# Patient Record
Sex: Male | Born: 1950 | Hispanic: Refuse to answer | Smoking: Current some day smoker
Health system: Southern US, Community
[De-identification: ages and names within clinical notes are randomized; demographics above are authoritative.]

## PROBLEM LIST (undated history)

## (undated) DIAGNOSIS — H269 Unspecified cataract: Secondary | ICD-10-CM

## (undated) DIAGNOSIS — I639 Cerebral infarction, unspecified: Secondary | ICD-10-CM

## (undated) DIAGNOSIS — I10 Essential (primary) hypertension: Secondary | ICD-10-CM

## (undated) DIAGNOSIS — M1990 Unspecified osteoarthritis, unspecified site: Secondary | ICD-10-CM

## (undated) HISTORY — PX: HIP FRACTURE SURGERY: SHX118

## (undated) HISTORY — PX: LEG AMPUTATION BELOW KNEE: SHX694

## (undated) HISTORY — DX: Unspecified cataract: H26.9

---

## 2008-05-01 DEATH — deceased

## 2009-07-12 ENCOUNTER — Encounter: Admission: RE | Admit: 2009-07-12 | Discharge: 2009-08-10 | Payer: Self-pay | Admitting: Orthopedic Surgery

## 2009-10-17 ENCOUNTER — Encounter: Admission: RE | Admit: 2009-10-17 | Discharge: 2009-12-13 | Payer: Self-pay | Admitting: Orthopedic Surgery

## 2010-11-27 ENCOUNTER — Emergency Department (HOSPITAL_COMMUNITY)
Admission: EM | Admit: 2010-11-27 | Discharge: 2010-11-27 | Disposition: A | Discharge status: Home / Self Care | Payer: Medicaid Other | Attending: Emergency Medicine | Admitting: Emergency Medicine

## 2010-11-27 DIAGNOSIS — I10 Essential (primary) hypertension: Secondary | ICD-10-CM | POA: Insufficient documentation

## 2010-11-27 DIAGNOSIS — M25519 Pain in unspecified shoulder: Secondary | ICD-10-CM | POA: Insufficient documentation

## 2010-11-27 DIAGNOSIS — H5702 Anisocoria: Secondary | ICD-10-CM | POA: Insufficient documentation

## 2011-03-25 ENCOUNTER — Emergency Department (HOSPITAL_COMMUNITY)
Admission: EM | Admit: 2011-03-25 | Discharge: 2011-03-25 | Disposition: A | Discharge status: Home / Self Care | Payer: Medicaid Other | Attending: Emergency Medicine | Admitting: Emergency Medicine

## 2011-03-25 ENCOUNTER — Encounter: Payer: Self-pay | Admitting: Emergency Medicine

## 2011-03-25 DIAGNOSIS — S161XXA Strain of muscle, fascia and tendon at neck level, initial encounter: Secondary | ICD-10-CM

## 2011-03-25 DIAGNOSIS — X58XXXA Exposure to other specified factors, initial encounter: Secondary | ICD-10-CM | POA: Insufficient documentation

## 2011-03-25 DIAGNOSIS — S88119A Complete traumatic amputation at level between knee and ankle, unspecified lower leg, initial encounter: Secondary | ICD-10-CM | POA: Insufficient documentation

## 2011-03-25 DIAGNOSIS — M2550 Pain in unspecified joint: Secondary | ICD-10-CM | POA: Insufficient documentation

## 2011-03-25 DIAGNOSIS — H9209 Otalgia, unspecified ear: Secondary | ICD-10-CM | POA: Insufficient documentation

## 2011-03-25 DIAGNOSIS — S139XXA Sprain of joints and ligaments of unspecified parts of neck, initial encounter: Secondary | ICD-10-CM | POA: Insufficient documentation

## 2011-03-25 DIAGNOSIS — M542 Cervicalgia: Secondary | ICD-10-CM | POA: Insufficient documentation

## 2011-03-25 MED ORDER — CYCLOBENZAPRINE HCL 5 MG PO TABS: 5.0000 mg | ORAL_TABLET | Freq: Three times a day (TID) | ORAL | Status: AC | PRN

## 2011-03-25 NOTE — ED Provider Notes (Signed)
History     CSN: [id number]  Arrival date & time 03/25/11  1900   First MD Initiated Contact with Patient 03/25/11 1958      Chief Complaint  Patient presents with  . Otalgia    (Consider location/radiation/quality/duration/timing/severity/associated sxs/prior treatment) HPI Comments: Kristopher Ruiz is a gentleman with 2 days of right neck pain.  That is increased with movement of his neck his pain radiates to his posterior ear, as well as to the clavicle area.  Denies trauma fever, ear infections, sore throat, toothache, fever.  Also reports, that he is out of the Percocet that he normally takes for the pain in his stump from putting tape on before his prosthesis.  He saw his primary care doctor last Thursday 6 days ago and did not report that he was out of his Percocet is requesting a refill of his Percocet at this time.  Per the controlled substance reporting website.  Mr. Sturgill received a prescription for 60 Percocet 10/325 prescribed by Dr. Mayford Knife on December 7.  He was using them correctly.  He should still have Percocet available  Patient is a 60 y.o. male presenting with ear pain. The history is provided by the patient.  Otalgia This is a new problem. The current episode started 2 days ago. There is pain in the right ear. The problem occurs constantly. The problem has not changed since onset.There has been no fever. The pain is at a severity of 2/10. The pain is mild. Associated symptoms include neck pain. Pertinent negatives include no ear discharge, no headaches, no hearing loss, no rhinorrhea and no sore throat. His past medical history does not include chronic ear infection or hearing loss.    History reviewed. No pertinent past medical history.  Past Surgical History  Procedure Date  . Leg amputation below knee     No family history on file.  History  Substance Use Topics  . Smoking status: Current Everyday Smoker    Types: Cigarettes  . Smokeless tobacco: Not on file    . Alcohol Use: No      Review of Systems  Constitutional: Negative.   HENT: Positive for ear pain and neck pain. Negative for hearing loss, sore throat, rhinorrhea, trouble swallowing, dental problem and ear discharge.   Eyes: Negative.   Respiratory: Negative.   Cardiovascular: Negative.   Gastrointestinal: Negative.   Genitourinary: Negative.   Musculoskeletal: Positive for arthralgias. Negative for myalgias, back pain, joint swelling and gait problem.  Neurological: Negative for dizziness, weakness and headaches.  Hematological: Negative.   Psychiatric/Behavioral: Negative.     Allergies  Review of patient's allergies indicates no known allergies.  Home Medications  No current outpatient prescriptions on file.  BP 110/81  Pulse 71  Temp(Src) 98.1 F (36.7 C) (Oral)  Resp 16  SpO2 97%  Physical Exam  Constitutional: He is oriented to person, place, and time. He appears well-developed and well-nourished.  HENT:  Head: Normocephalic.  Right Ear: External ear normal.  Left Ear: External ear normal.  Nose: Nose normal.  Mouth/Throat: Oropharynx is clear and moist. No oropharyngeal exudate.  Eyes: Pupils are equal, round, and reactive to light.  Neck: Normal range of motion.  Cardiovascular: Normal rate.   Pulmonary/Chest: Effort normal.  Musculoskeletal: Normal range of motion.  Neurological: He is oriented to person, place, and time.  Skin: Skin is warm and dry.    ED Course  Procedures (including critical care time)  Labs Reviewed - No data to display  No results found.   No diagnosis found.    MDM  Mr. Mrs. physical exam is quite normal without evidence of ear infection.  No rhinitis.  No dental decay.  Throat appears normal in appearance.  Uvula is midline.  After checking the controlled substance website he should have Percocet available for his use as 90 tablets were prescribed.  On 03/07/2011 we'll prescribe a muscle relaxer for Mr. Mrs. and advised  him to follow up with his primary care physician to fulfill his request for additional Percocet.        Arman Filter, NP 03/25/11 2018

## 2011-03-25 NOTE — ED Notes (Signed)
Pt c/o right ear pain radiating into right side of head.  Onset 2 days ago, has taken OTC meds without relief

## 2011-03-25 NOTE — ED Provider Notes (Signed)
Medical screening examination/treatment/procedure(s) were performed by non-physician practitioner and as supervising physician I was immediately available for consultation/collaboration.   Dayton Bailiff, MD 03/25/11 2022

## 2011-03-25 NOTE — ED Notes (Signed)
Pt here today due to pain in right side of ear, and head and neck, no congestion, no injury ear canal is not reddened or irritated ear drum pearl in color,  Pt states pain started Sunday and worsened over several days

## 2011-10-01 ENCOUNTER — Ambulatory Visit
Admission: RE | Admit: 2011-10-01 | Discharge: 2011-10-01 | Disposition: A | Discharge status: Home / Self Care | Payer: Medicaid Other | Source: Ambulatory Visit | Attending: Physician Assistant | Admitting: Physician Assistant

## 2011-10-01 ENCOUNTER — Other Ambulatory Visit: Payer: Self-pay | Admitting: Physician Assistant

## 2011-10-01 DIAGNOSIS — M545 Low back pain, unspecified: Secondary | ICD-10-CM

## 2011-10-14 ENCOUNTER — Emergency Department (HOSPITAL_COMMUNITY): Payer: Medicaid Other

## 2011-10-14 ENCOUNTER — Emergency Department (HOSPITAL_COMMUNITY)
Admission: EM | Admit: 2011-10-14 | Discharge: 2011-10-14 | Disposition: A | Discharge status: Home / Self Care | Payer: Medicaid Other | Attending: Emergency Medicine | Admitting: Emergency Medicine

## 2011-10-14 ENCOUNTER — Encounter (HOSPITAL_COMMUNITY): Payer: Self-pay | Admitting: *Deleted

## 2011-10-14 DIAGNOSIS — Z79899 Other long term (current) drug therapy: Secondary | ICD-10-CM | POA: Insufficient documentation

## 2011-10-14 DIAGNOSIS — F17200 Nicotine dependence, unspecified, uncomplicated: Secondary | ICD-10-CM | POA: Insufficient documentation

## 2011-10-14 DIAGNOSIS — I10 Essential (primary) hypertension: Secondary | ICD-10-CM | POA: Insufficient documentation

## 2011-10-14 DIAGNOSIS — M25551 Pain in right hip: Secondary | ICD-10-CM

## 2011-10-14 DIAGNOSIS — M25559 Pain in unspecified hip: Secondary | ICD-10-CM | POA: Insufficient documentation

## 2011-10-14 DIAGNOSIS — S88119A Complete traumatic amputation at level between knee and ankle, unspecified lower leg, initial encounter: Secondary | ICD-10-CM | POA: Insufficient documentation

## 2011-10-14 HISTORY — DX: Essential (primary) hypertension: I10

## 2011-10-14 MED ORDER — OXYCODONE-ACETAMINOPHEN 5-325 MG PO TABS
1.0000 | ORAL_TABLET | Freq: Once | ORAL | Status: AC
  Administered 2011-10-14: 1 via ORAL
  Filled 2011-10-14: qty 1

## 2011-10-14 MED ORDER — OXYCODONE-ACETAMINOPHEN 5-325 MG PO TABS: 1.0000 | ORAL_TABLET | Freq: Four times a day (QID) | ORAL | Status: AC | PRN

## 2011-10-14 NOTE — ED Notes (Signed)
Patient transported to X-ray 
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
## 2011-10-14 NOTE — ED Notes (Signed)
Pt has pins in right hip since 1999 and started hurting since last week.

## 2011-10-14 NOTE — ED Provider Notes (Signed)
Medical screening examination/treatment/procedure(s) were performed by non-physician practitioner and as supervising physician I was immediately available for consultation/collaboration.   Glynn Octave, MD 10/14/11 1550
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
## 2012-07-03 ENCOUNTER — Emergency Department (INDEPENDENT_AMBULATORY_CARE_PROVIDER_SITE_OTHER)
Admission: EM | Admit: 2012-07-03 | Discharge: 2012-07-03 | Disposition: A | Discharge status: Home / Self Care | Payer: Self-pay | Source: Home / Self Care

## 2012-07-03 ENCOUNTER — Emergency Department (INDEPENDENT_AMBULATORY_CARE_PROVIDER_SITE_OTHER): Payer: Medicaid Other

## 2012-07-03 ENCOUNTER — Encounter (HOSPITAL_COMMUNITY): Payer: Self-pay | Admitting: Emergency Medicine

## 2012-07-03 DIAGNOSIS — S139XXA Sprain of joints and ligaments of unspecified parts of neck, initial encounter: Secondary | ICD-10-CM

## 2012-07-03 DIAGNOSIS — S335XXA Sprain of ligaments of lumbar spine, initial encounter: Secondary | ICD-10-CM

## 2012-07-03 LAB — POCT I-STAT, CHEM 8
BUN: 10 mg/dL (ref 6–23)
Chloride: 102 mEq/L (ref 96–112)
Creatinine, Ser: 1.1 mg/dL (ref 0.50–1.35)
Potassium: 3.8 mEq/L (ref 3.5–5.1)
Sodium: 141 mEq/L (ref 135–145)
TCO2: 31 mmol/L (ref 0–100)

## 2012-07-03 MED ORDER — CYCLOBENZAPRINE HCL 10 MG PO TABS: 5.0000 mg | ORAL_TABLET | Freq: Three times a day (TID) | ORAL | Status: DC | PRN

## 2012-07-03 MED ORDER — OXYCODONE-ACETAMINOPHEN 5-325 MG PO TABS: 1.0000 | ORAL_TABLET | ORAL | Status: DC | PRN

## 2012-07-03 MED ORDER — TROLAMINE SALICYLATE 10 % EX CREA: TOPICAL_CREAM | CUTANEOUS | Status: DC | PRN

## 2012-07-03 MED ORDER — IBUPROFEN 200 MG PO TABS: 400.0000 mg | ORAL_TABLET | Freq: Three times a day (TID) | ORAL | Status: DC | PRN
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
## 2013-04-03 ENCOUNTER — Emergency Department (HOSPITAL_COMMUNITY)
Admission: EM | Admit: 2013-04-03 | Discharge: 2013-04-03 | Disposition: A | Discharge status: Home / Self Care | Payer: Medicaid Other | Attending: Emergency Medicine | Admitting: Emergency Medicine

## 2013-04-03 ENCOUNTER — Encounter (HOSPITAL_COMMUNITY): Payer: Self-pay | Admitting: Emergency Medicine

## 2013-04-03 DIAGNOSIS — F17200 Nicotine dependence, unspecified, uncomplicated: Secondary | ICD-10-CM | POA: Insufficient documentation

## 2013-04-03 DIAGNOSIS — Z791 Long term (current) use of non-steroidal anti-inflammatories (NSAID): Secondary | ICD-10-CM | POA: Insufficient documentation

## 2013-04-03 DIAGNOSIS — G8929 Other chronic pain: Secondary | ICD-10-CM | POA: Insufficient documentation

## 2013-04-03 DIAGNOSIS — Z7982 Long term (current) use of aspirin: Secondary | ICD-10-CM | POA: Insufficient documentation

## 2013-04-03 DIAGNOSIS — Z79899 Other long term (current) drug therapy: Secondary | ICD-10-CM | POA: Insufficient documentation

## 2013-04-03 DIAGNOSIS — M79606 Pain in leg, unspecified: Secondary | ICD-10-CM

## 2013-04-03 DIAGNOSIS — M79609 Pain in unspecified limb: Secondary | ICD-10-CM | POA: Insufficient documentation

## 2013-04-03 DIAGNOSIS — I10 Essential (primary) hypertension: Secondary | ICD-10-CM | POA: Insufficient documentation

## 2013-04-03 MED ORDER — TRAMADOL HCL 50 MG PO TABS: 50.0000 mg | ORAL_TABLET | Freq: Four times a day (QID) | ORAL | Status: DC | PRN

## 2013-04-03 MED ORDER — KETOROLAC TROMETHAMINE 60 MG/2ML IM SOLN
60.0000 mg | Freq: Once | INTRAMUSCULAR | Status: AC
  Administered 2013-04-03: 60 mg via INTRAMUSCULAR
  Filled 2013-04-03: qty 2

## 2013-04-03 NOTE — ED Notes (Signed)
Patient states that he needs a shot for the pain in his left lower BKA

## 2013-04-03 NOTE — ED Notes (Signed)
Patient ambulated to door requesting time frame to see a physician. Patient was advised the department is very busy this evening and our doctors are seeing patients as quickly as possible, however because he is here for a chronic pain situation, there are patients who are more critical that have to be seen first. Patient states he will wait a few more minutes but then he will be leaving. Patient advised he is welcome to stay as long as he would like to seek treatment, however he also has the right to leave at any time of his choosing.

## 2013-04-03 NOTE — ED Notes (Signed)
Patient ambulated from room to restroom unassisted.

## 2013-04-03 NOTE — ED Provider Notes (Signed)
Medical screening examination/treatment/procedure(s) were performed by non-physician practitioner and as supervising physician I was immediately available for consultation/collaboration.    Dunia Pringle M Dhyana Bastone, MD 04/03/13 0743 

## 2013-04-03 NOTE — Discharge Instructions (Signed)
Follow up with your doctor for management of your chronic leg pain. Take Tramadol as needed for pain.

## 2013-04-03 NOTE — ED Provider Notes (Signed)
CSN: [id number]     Arrival date & time 04/03/13  0049 History   First MD Initiated Contact with Patient 04/03/13 0301     Chief Complaint  Patient presents with  . chronic leg pain    (Consider location/radiation/quality/duration/timing/severity/associated sxs/prior Treatment) HPI Comments: Patient is a 63 year old male with a past medical history of hypertension who presents with worsening chronic left leg pain. Patient has a left BKA and has had pain "for a long time." The pain is aching and severe and radiates up to his left hip. Patient took tylenol at home which provides no relief. Patient denies any injury or new symptoms. Patient is requesting a cortisone shot in his hip for pain. No aggravating/alleviating factors.    Past Medical History  Diagnosis Date  . Hypertension    Past Surgical History  Procedure Laterality Date  . Leg amputation below knee    . Hip fracture surgery     History reviewed. No pertinent family history. History  Substance Use Topics  . Smoking status: Current Every Day Smoker    Types: Cigarettes  . Smokeless tobacco: Not on file  . Alcohol Use: No    Review of Systems  Constitutional: Negative for fever, chills and fatigue.  HENT: Negative for trouble swallowing.   Eyes: Negative for visual disturbance.  Respiratory: Negative for shortness of breath.   Cardiovascular: Negative for chest pain and palpitations.  Gastrointestinal: Negative for nausea, vomiting, abdominal pain and diarrhea.  Genitourinary: Negative for dysuria and difficulty urinating.  Musculoskeletal: Positive for arthralgias. Negative for neck pain.  Skin: Negative for color change.  Neurological: Negative for dizziness and weakness.  Psychiatric/Behavioral: Negative for dysphoric mood.    Allergies  Ibuprofen  Home Medications   Current Outpatient Rx  Name  Route  Sig  Dispense  Refill  . aspirin 81 MG tablet   Oral   Take 81 mg by mouth daily.         Marland Kitchen  atorvastatin (LIPITOR) 20 MG tablet   Oral   Take 20 mg by mouth daily.         Marland Kitchen diltiazem (TIAZAC) 120 MG 24 hr capsule   Oral   Take 120 mg by mouth daily.         . hydrochlorothiazide (HYDRODIURIL) 25 MG tablet   Oral   Take 25 mg by mouth daily.         Marland Kitchen loratadine (CLARITIN) 10 MG tablet   Oral   Take 10 mg by mouth daily.         . metoprolol succinate (TOPROL-XL) 50 MG 24 hr tablet   Oral   Take 50 mg by mouth daily. Take with or immediately following a meal.         . naproxen (NAPROSYN) 500 MG tablet   Oral   Take 500 mg by mouth 2 (two) times daily with a meal.         . omeprazole (PRILOSEC) 40 MG capsule   Oral   Take 40 mg by mouth daily.         Marland Kitchen oxyCODONE-acetaminophen (PERCOCET) 10-325 MG per tablet   Oral   Take 1 tablet by mouth every 4 (four) hours as needed.         . trolamine salicylate (ASPERCREME/ALOE) 10 % cream   Topical   Apply topically as needed.   85 g   0   . traMADol (ULTRAM) 50 MG tablet   Oral  Take 1 tablet (50 mg total) by mouth every 6 (six) hours as needed.   15 tablet   0    BP 135/82  Pulse 73  Temp(Src) 97.6 F (36.4 C) (Oral)  Resp 20  Ht 5\' 3"  (1.6 m)  Wt 164 lb (74.39 kg)  BMI 29.06 kg/m2  SpO2 98% Physical Exam  Nursing note and vitals reviewed. Constitutional: He is oriented to person, place, and time. He appears well-developed and well-nourished. No distress.  HENT:  Head: Normocephalic and atraumatic.  Eyes: Conjunctivae and EOM are normal.  Neck: Normal range of motion.  Cardiovascular: Normal rate and regular rhythm.  Exam reveals no gallop and no friction rub.   No murmur heard. Pulmonary/Chest: Effort normal and breath sounds normal. He has no wheezes. He has no rales. He exhibits no tenderness.  Musculoskeletal: Normal range of motion.  Left BKA. No tenderness to palpation or obvious deformity of the left knee joint.   Neurological: He is alert and oriented to person, place, and  time. Coordination normal.  Speech is goal-oriented. Moves limbs without ataxia.   Skin: Skin is warm and dry.  Psychiatric: He has a normal mood and affect. His behavior is normal.    ED Course  Procedures (including critical care time) Labs Review Labs Reviewed - No data to display Imaging Review No results found.  EKG Interpretation   None       MDM   1. Chronic leg pain    4:16 AM Patient will have Toradol IM here for pain. Patient will be discharged with Tramadol for pain. Vitals stable and patient afebrile. Patient is able to ambulate on his prothesis without difficulty.    Emilia BeckKaitlyn Nafis Farnan, [place] JerseyPA-C 04/03/13 [phone]

## 2013-10-24 IMAGING — CR DG LUMBAR SPINE COMPLETE 4+V
5 series · 5 of 5 positions shown · non-contrast
Comparison: None.

CLINICAL DATA: Low back pain.  Right leg pain.

LUMBAR SPINE - COMPLETE 4+ VIEW

[t l-spine a.p.]
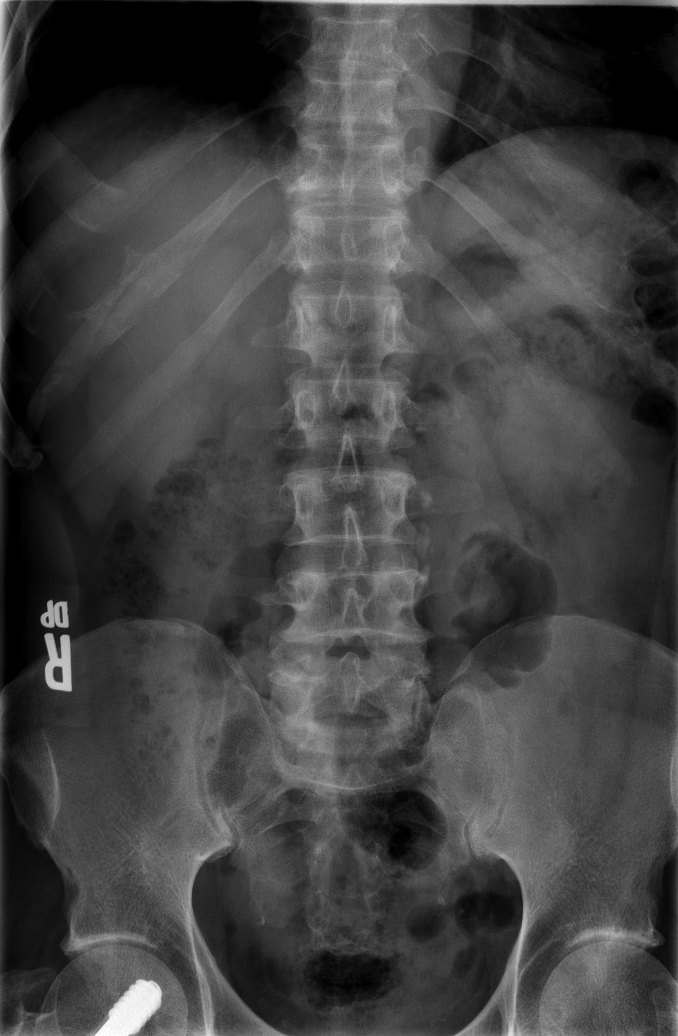

[t l-spine oblique exposure (1 of 2)]
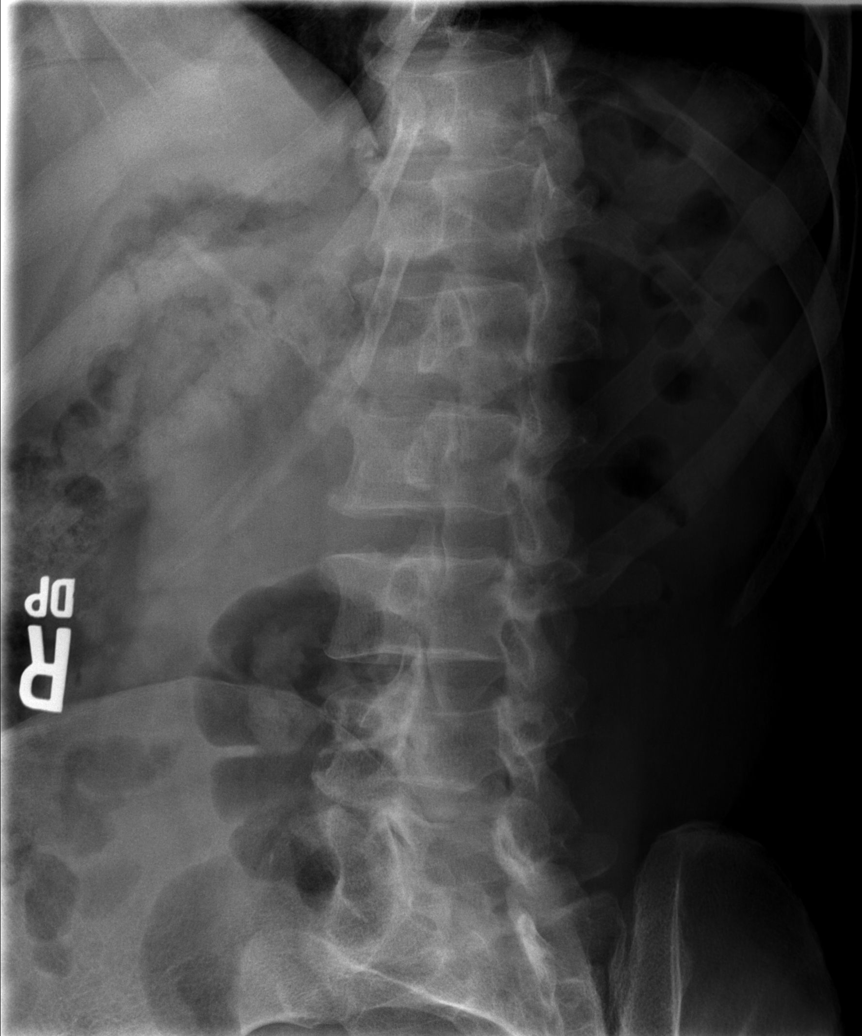

[t l-spine oblique exposure (2 of 2)]
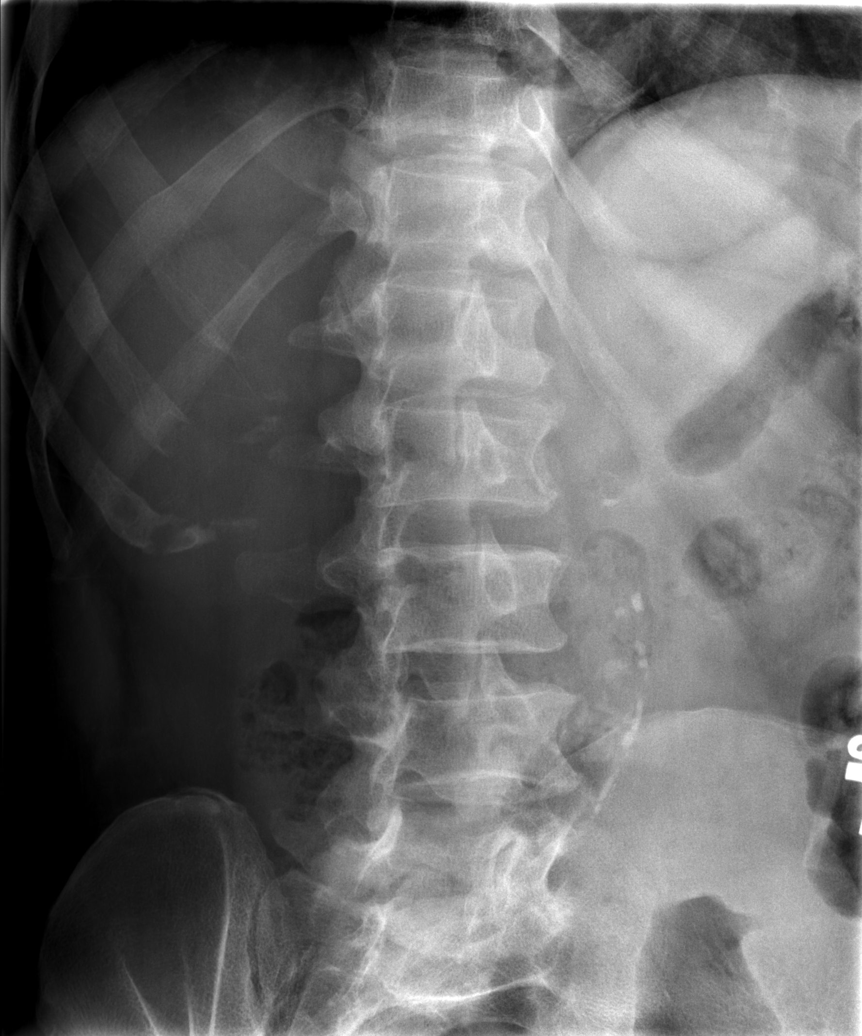

[t l-spine lat]
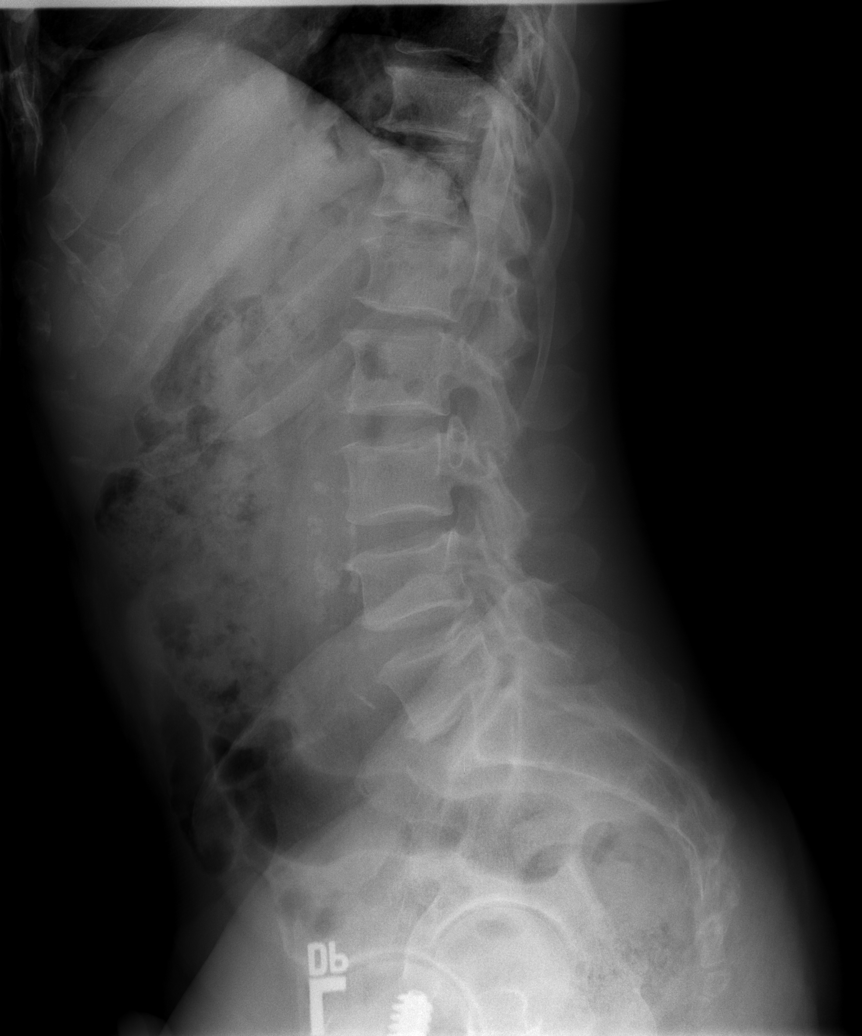

[t l-spine l5-s1 spot]
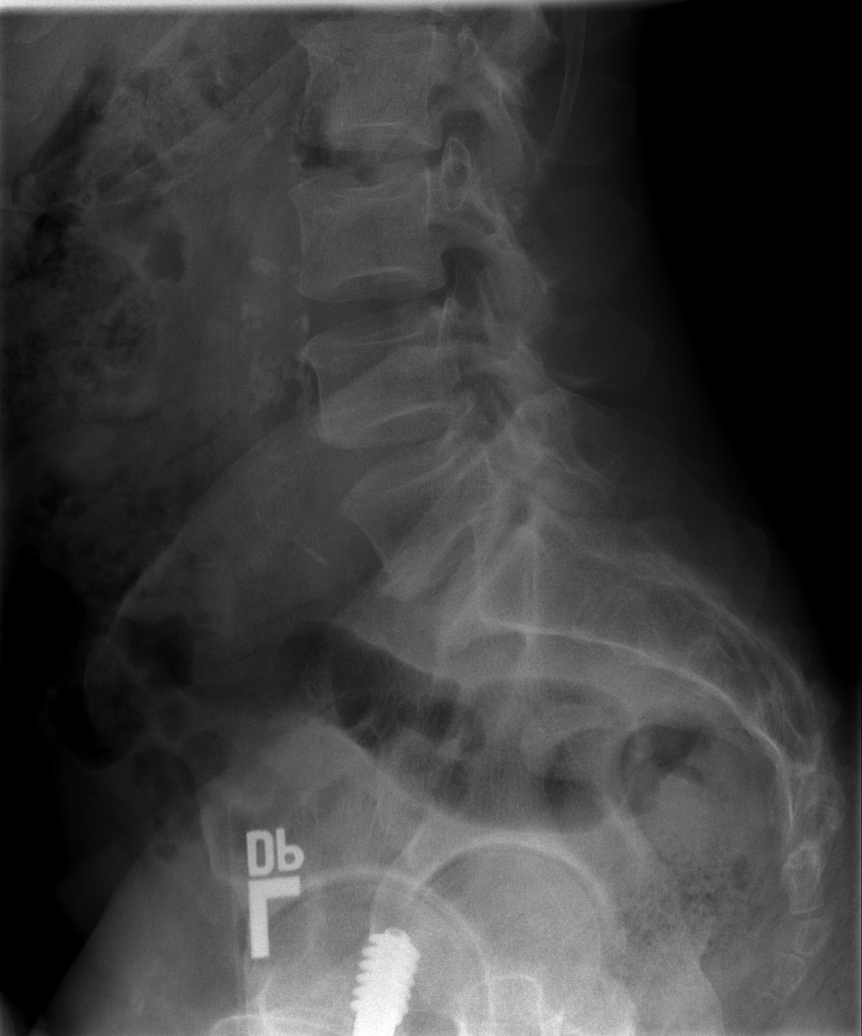

[5 of 5 positions shown; findings below may reference images not displayed]

FINDINGS: Five lumbar-type vertebral bodies show normal alignment.
Disc space heights are normal except for mild narrowing at L5-S1.
There is mild lower lumbar facet degeneration but no slippage.
Arterial calcification is noted.  Sacroiliac joints appear normal.
IMPRESSION: Mild disc space narrowing L5-S1.  Lower lumbar facet arthropathy
could be symptomatic.

Calcific atherosclerosis.

## 2015-03-17 ENCOUNTER — Telehealth: Payer: Self-pay | Admitting: Physician Assistant

## 2015-03-17 NOTE — Telephone Encounter (Signed)
This chart was opened accidentally.

## 2016-04-22 ENCOUNTER — Other Ambulatory Visit: Payer: Self-pay | Admitting: Pain Medicine

## 2016-11-06 ENCOUNTER — Ambulatory Visit (INDEPENDENT_AMBULATORY_CARE_PROVIDER_SITE_OTHER): Payer: Medicaid Other | Admitting: Physician Assistant

## 2017-01-28 ENCOUNTER — Ambulatory Visit: Payer: Self-pay | Admitting: Adult Health

## 2017-03-19 ENCOUNTER — Ambulatory Visit (INDEPENDENT_AMBULATORY_CARE_PROVIDER_SITE_OTHER): Payer: Medicaid Other | Admitting: Orthopedic Surgery

## 2017-03-19 ENCOUNTER — Encounter (INDEPENDENT_AMBULATORY_CARE_PROVIDER_SITE_OTHER): Payer: Self-pay | Admitting: Orthopedic Surgery

## 2017-03-19 DIAGNOSIS — Z89512 Acquired absence of left leg below knee: Secondary | ICD-10-CM | POA: Diagnosis not present

## 2017-03-19 DIAGNOSIS — IMO0002 Reserved for concepts with insufficient information to code with codable children: Secondary | ICD-10-CM

## 2017-03-19 NOTE — Progress Notes (Signed)
   Office Visit Note   Patient: Kristopher Ruiz           Date of Birth: 1950/09/28           MRN: [id number] Visit Date: 03/19/2017              Requested by: No referring provider defined for this encounter. PCP: Patient, No Pcp Per  Chief Complaint  Patient presents with  . Left Leg - Pain      HPI: The patient is a 66 year old gentleman seen today for evaluation of his left below the knee amputation.  He is 6 years out from surgery his current prosthesis is about 67 years old.  The liner has broken down he is currently wearing 20+ applied daily having hard time getting his prosthetic to stay on.  Despite this is a [employer] has been doing well overall for the last several years.  Kristopher Ruiz from [employer] is the visit.  Assessment & Plan: Visit Diagnoses:  1. Below knee amputation status, left (HCC)     Plan: Given an order for a new prosthetic for his left knee amputation today.  We will follow-up in office as needed.  Follow-Up Instructions: Return if symptoms worsen or fail to improve.   Ortho Exam  Patient is alert, oriented, no adenopathy, well-dressed, normal affect, normal respiratory effort. On examination of the left residual limb he has had considerable atrophy there is no open areas does have some callus buildup from an wearing no erythema no sign of infection.  Imaging: No results found. No images are attached to the encounter.  Labs: No results found for: HGBA1C, ESRSEDRATE, CRP, LABURIC, REPTSTATUS, GRAMSTAIN, CULT, LABORGA  @LABSALLVALUES (HGBA1)@  There is no height or weight on file to calculate BMI.  Orders:  No orders of the defined types were placed in this encounter.  No orders of the defined types were placed in this encounter.    Procedures: No procedures performed  Clinical Data: No additional findings.  ROS:  All other systems negative, except as noted in the HPI. Review of Systems  Constitutional: Negative for chills  and fever.  Cardiovascular: Negative for leg swelling.  Skin: Negative for color change and wound.    Objective: Vital Signs: There were no vitals taken for this visit.  Specialty Comments:  No specialty comments available.  PMFS History: There are no active problems to display for this patient.  Past Medical History:  Diagnosis Date  . Hypertension     History reviewed. No pertinent family history.  Past Surgical History:  Procedure Laterality Date  . HIP FRACTURE SURGERY    . LEG AMPUTATION BELOW KNEE     Social History   Occupational History  . Not on file  Tobacco Use  . Smoking status: Current Every Day Smoker    Types: Cigarettes  . Smokeless tobacco: Never Used  Substance and Sexual Activity  . Alcohol use: No  . Drug use: No  . Sexual activity: Not on file

## 2017-04-16 ENCOUNTER — Encounter (INDEPENDENT_AMBULATORY_CARE_PROVIDER_SITE_OTHER): Payer: Self-pay | Admitting: Orthopedic Surgery

## 2017-04-16 ENCOUNTER — Ambulatory Visit (INDEPENDENT_AMBULATORY_CARE_PROVIDER_SITE_OTHER): Payer: Medicare HMO | Admitting: Orthopedic Surgery

## 2017-04-16 DIAGNOSIS — S88112A Complete traumatic amputation at level between knee and ankle, left lower leg, initial encounter: Secondary | ICD-10-CM | POA: Insufficient documentation

## 2017-04-16 DIAGNOSIS — Z89512 Acquired absence of left leg below knee: Secondary | ICD-10-CM | POA: Diagnosis not present

## 2017-04-16 DIAGNOSIS — IMO0002 Reserved for concepts with insufficient information to code with codable children: Secondary | ICD-10-CM

## 2017-04-16 MED ORDER — GABAPENTIN 100 MG PO CAPS: 100.0000 mg | ORAL_CAPSULE | Freq: Three times a day (TID) | ORAL | 0 refills | Status: DC

## 2017-04-16 NOTE — Progress Notes (Signed)
   Office Visit Note   Patient: Kristopher AquasDavid Trautner           Date of Birth: December 16, 1950           MRN: [id number] Visit Date: 04/16/2017              Requested by: No referring provider defined for this encounter. PCP: Patient, No Pcp Per  Chief Complaint  Patient presents with  . Left Leg - Follow-up      HPI: Patient is a 67 year old gentleman seen today for initial evaluation of left below knee amputation pain. Has shooting pain in distal residual limb. States saw a different provider earlier this month was given Percocet which he states worked well.  Burning and tingling pain, itching in toes, phantom pain.  States has not ever tried neurontin  Assessment & Plan: Visit Diagnoses: No diagnosis found.  Plan: will trial neurontin, sent to [facility]  Follow-Up Instructions: No Follow-up on file.   Ortho Exam  Patient is alert, oriented, no adenopathy, well-dressed, normal affect, normal respiratory effort. Left residual limb is well consolidated and well healed. No ulcers or impending breakdown. No tenderness or hypersensitivity.   Imaging: No results found. No images are attached to the encounter.  Labs: No results found for: HGBA1C, ESRSEDRATE, CRP, LABURIC, REPTSTATUS, GRAMSTAIN, CULT, LABORGA  @LABSALLVALUES (HGBA1)@  There is no height or weight on file to calculate BMI.  Orders:  No orders of the defined types were placed in this encounter.  Meds ordered this encounter  Medications  . gabapentin (NEURONTIN) 100 MG capsule    Sig: Take 1 capsule (100 mg total) by mouth 3 (three) times daily. Take 1 capsule by mouth at bedtime x 1 week, then bid x 1 week, then tid    Dispense:  90 capsule    Refill:  0     Procedures: No procedures performed  Clinical Data: No additional findings.  ROS:  All other systems negative, except as noted in the HPI. Review of Systems  Constitutional: Negative for chills and fever.  Musculoskeletal: Positive for  myalgias.  Skin: Negative for color change and wound.  Neurological: Positive for numbness. Negative for weakness.    Objective: Vital Signs: There were no vitals taken for this visit.  Specialty Comments:  No specialty comments available.  PMFS History: There are no active problems to display for this patient.  Past Medical History:  Diagnosis Date  . Hypertension     History reviewed. No pertinent family history.  Past Surgical History:  Procedure Laterality Date  . HIP FRACTURE SURGERY    . LEG AMPUTATION BELOW KNEE     Social History   Occupational History  . Not on file  Tobacco Use  . Smoking status: Current Every Day Smoker    Types: Cigarettes  . Smokeless tobacco: Never Used  Substance and Sexual Activity  . Alcohol use: No  . Drug use: No  . Sexual activity: Not on file

## 2017-04-30 ENCOUNTER — Telehealth (INDEPENDENT_AMBULATORY_CARE_PROVIDER_SITE_OTHER): Payer: Self-pay | Admitting: Family

## 2017-04-30 NOTE — Telephone Encounter (Signed)
Patient came in stating that the Gabapentin did nothing for his pain, he would like for Erin to call something else into his pharmacy. [phone].  Thank you.

## 2017-05-01 NOTE — Telephone Encounter (Signed)
Called pt and he states that he will try and take 2 Neurontin and see if this helps and that if he does not get pain relief with this then he will call and let us know if he wants to make the pain management referral

## 2017-05-25 ENCOUNTER — Other Ambulatory Visit (INDEPENDENT_AMBULATORY_CARE_PROVIDER_SITE_OTHER): Payer: Self-pay | Admitting: Family

## 2018-09-06 ENCOUNTER — Inpatient Hospital Stay (HOSPITAL_COMMUNITY)
Admission: EM | Admit: 2018-09-06 | Discharge: 2018-09-08 | DRG: 066 | Disposition: A | Discharge status: Home Health | Payer: Medicare Other | Attending: Internal Medicine | Admitting: Internal Medicine

## 2018-09-06 ENCOUNTER — Emergency Department (HOSPITAL_COMMUNITY): Payer: Medicare Other

## 2018-09-06 ENCOUNTER — Encounter (HOSPITAL_COMMUNITY): Payer: Self-pay | Admitting: Emergency Medicine

## 2018-09-06 ENCOUNTER — Inpatient Hospital Stay (HOSPITAL_COMMUNITY): Payer: Medicare Other

## 2018-09-06 ENCOUNTER — Other Ambulatory Visit: Payer: Self-pay

## 2018-09-06 DIAGNOSIS — K219 Gastro-esophageal reflux disease without esophagitis: Secondary | ICD-10-CM | POA: Diagnosis present

## 2018-09-06 DIAGNOSIS — S88112A Complete traumatic amputation at level between knee and ankle, left lower leg, initial encounter: Secondary | ICD-10-CM | POA: Diagnosis present

## 2018-09-06 DIAGNOSIS — Z720 Tobacco use: Secondary | ICD-10-CM | POA: Diagnosis not present

## 2018-09-06 DIAGNOSIS — R29700 NIHSS score 0: Secondary | ICD-10-CM | POA: Diagnosis present

## 2018-09-06 DIAGNOSIS — I63211 Cerebral infarction due to unspecified occlusion or stenosis of right vertebral arteries: Secondary | ICD-10-CM | POA: Diagnosis not present

## 2018-09-06 DIAGNOSIS — R7303 Prediabetes: Secondary | ICD-10-CM | POA: Diagnosis present

## 2018-09-06 DIAGNOSIS — T383X5A Adverse effect of insulin and oral hypoglycemic [antidiabetic] drugs, initial encounter: Secondary | ICD-10-CM

## 2018-09-06 DIAGNOSIS — F17210 Nicotine dependence, cigarettes, uncomplicated: Secondary | ICD-10-CM | POA: Diagnosis present

## 2018-09-06 DIAGNOSIS — Z7982 Long term (current) use of aspirin: Secondary | ICD-10-CM

## 2018-09-06 DIAGNOSIS — I639 Cerebral infarction, unspecified: Secondary | ICD-10-CM | POA: Diagnosis not present

## 2018-09-06 DIAGNOSIS — E785 Hyperlipidemia, unspecified: Secondary | ICD-10-CM | POA: Diagnosis not present

## 2018-09-06 DIAGNOSIS — H5500 Unspecified nystagmus: Secondary | ICD-10-CM | POA: Diagnosis present

## 2018-09-06 DIAGNOSIS — I10 Essential (primary) hypertension: Secondary | ICD-10-CM | POA: Diagnosis present

## 2018-09-06 DIAGNOSIS — Z89512 Acquired absence of left leg below knee: Secondary | ICD-10-CM | POA: Diagnosis not present

## 2018-09-06 DIAGNOSIS — Z20828 Contact with and (suspected) exposure to other viral communicable diseases: Secondary | ICD-10-CM | POA: Diagnosis present

## 2018-09-06 DIAGNOSIS — Z8249 Family history of ischemic heart disease and other diseases of the circulatory system: Secondary | ICD-10-CM

## 2018-09-06 DIAGNOSIS — Z886 Allergy status to analgesic agent status: Secondary | ICD-10-CM

## 2018-09-06 DIAGNOSIS — Z79899 Other long term (current) drug therapy: Secondary | ICD-10-CM

## 2018-09-06 DIAGNOSIS — E160 Drug-induced hypoglycemia without coma: Secondary | ICD-10-CM | POA: Diagnosis not present

## 2018-09-06 LAB — CBC WITH DIFFERENTIAL/PLATELET
Abs Immature Granulocytes: 0.02 10*3/uL (ref 0.00–0.07)
Basophils Absolute: 0 10*3/uL (ref 0.0–0.1)
Basophils Relative: 0 %
Eosinophils Absolute: 0 10*3/uL (ref 0.0–0.5)
Eosinophils Relative: 0 %
HCT: 43.2 % (ref 39.0–52.0)
Hemoglobin: 14.3 g/dL (ref 13.0–17.0)
Immature Granulocytes: 0 %
Lymphocytes Relative: 19 %
Lymphs Abs: 1.8 10*3/uL (ref 0.7–4.0)
MCH: 27.3 pg (ref 26.0–34.0)
MCHC: 33.1 g/dL (ref 30.0–36.0)
MCV: 82.6 fL (ref 80.0–100.0)
Monocytes Absolute: 0.7 10*3/uL (ref 0.1–1.0)
Monocytes Relative: 8 %
Neutro Abs: 6.5 10*3/uL (ref 1.7–7.7)
Neutrophils Relative %: 73 %
Platelets: 232 10*3/uL (ref 150–400)
RBC: 5.23 MIL/uL (ref 4.22–5.81)
RDW: 14.8 % (ref 11.5–15.5)
WBC: 9 10*3/uL (ref 4.0–10.5)
nRBC: 0 % (ref 0.0–0.2)

## 2018-09-06 LAB — CBC
HCT: 44.5 % (ref 39.0–52.0)
Hemoglobin: 14.5 g/dL (ref 13.0–17.0)
MCH: 27 pg (ref 26.0–34.0)
MCHC: 32.6 g/dL (ref 30.0–36.0)
MCV: 82.7 fL (ref 80.0–100.0)
Platelets: 246 10*3/uL (ref 150–400)
RBC: 5.38 MIL/uL (ref 4.22–5.81)
RDW: 15 % (ref 11.5–15.5)
WBC: 7.3 10*3/uL (ref 4.0–10.5)
nRBC: 0 % (ref 0.0–0.2)

## 2018-09-06 LAB — GLUCOSE, CAPILLARY: Glucose-Capillary: 79 mg/dL (ref 70–99)

## 2018-09-06 LAB — URINALYSIS, ROUTINE W REFLEX MICROSCOPIC
Bilirubin Urine: NEGATIVE
Glucose, UA: NEGATIVE mg/dL
Hgb urine dipstick: NEGATIVE
Ketones, ur: 20 mg/dL — AB
Leukocytes,Ua: NEGATIVE
Nitrite: NEGATIVE
Protein, ur: NEGATIVE mg/dL
Specific Gravity, Urine: 1.042 — ABNORMAL HIGH (ref 1.005–1.030)
pH: 6 (ref 5.0–8.0)

## 2018-09-06 LAB — RAPID URINE DRUG SCREEN, HOSP PERFORMED
Amphetamines: NOT DETECTED
Barbiturates: NOT DETECTED
Benzodiazepines: NOT DETECTED
Cocaine: NOT DETECTED
Opiates: NOT DETECTED
Tetrahydrocannabinol: POSITIVE — AB

## 2018-09-06 LAB — BASIC METABOLIC PANEL
Anion gap: 12 (ref 5–15)
BUN: 12 mg/dL (ref 8–23)
CO2: 26 mmol/L (ref 22–32)
Calcium: 9.9 mg/dL (ref 8.9–10.3)
Chloride: 100 mmol/L (ref 98–111)
Creatinine, Ser: 0.79 mg/dL (ref 0.61–1.24)
GFR calc Af Amer: >60 mL/min (ref >60)
GFR calc non Af Amer: >60 mL/min (ref >60)
Glucose, Bld: 140 mg/dL — ABNORMAL HIGH (ref 70–99)
Potassium: 4.1 mmol/L (ref 3.5–5.1)
Sodium: 138 mmol/L (ref 135–145)

## 2018-09-06 LAB — PROTIME-INR
INR: 1.1 (ref 0.8–1.2)
Prothrombin Time: 13.8 seconds (ref 11.4–15.2)

## 2018-09-06 LAB — SARS CORONAVIRUS 2 BY RT PCR (HOSPITAL ORDER, PERFORMED IN ~~LOC~~ HOSPITAL LAB): SARS Coronavirus 2: NEGATIVE

## 2018-09-06 LAB — CBG MONITORING, ED
Glucose-Capillary: 51 mg/dL — ABNORMAL LOW (ref 70–99)
Glucose-Capillary: 92 mg/dL (ref 70–99)

## 2018-09-06 LAB — ETHANOL: Alcohol, Ethyl (B): <10 mg/dL (ref <10)

## 2018-09-06 LAB — APTT: aPTT: 25 seconds (ref 24–36)

## 2018-09-06 MED ORDER — SENNOSIDES-DOCUSATE SODIUM 8.6-50 MG PO TABS: 1.0000 | ORAL_TABLET | Freq: Every evening | ORAL | Status: DC | PRN

## 2018-09-06 MED ORDER — DILTIAZEM HCL ER COATED BEADS 120 MG PO CP24
120.0000 mg | ORAL_CAPSULE | Freq: Every day | ORAL | Status: DC
  Administered 2018-09-07 – 2018-09-08 (×2): 120 mg via ORAL
  Filled 2018-09-06 (×2): qty 1

## 2018-09-06 MED ORDER — GABAPENTIN 100 MG PO CAPS
100.0000 mg | ORAL_CAPSULE | Freq: Two times a day (BID) | ORAL | Status: DC
  Administered 2018-09-06 – 2018-09-08 (×4): 100 mg via ORAL
  Filled 2018-09-06 (×4): qty 1

## 2018-09-06 MED ORDER — ASPIRIN 325 MG PO TABS
325.0000 mg | ORAL_TABLET | Freq: Every day | ORAL | Status: DC
  Administered 2018-09-07: 325 mg via ORAL
  Filled 2018-09-06: qty 1

## 2018-09-06 MED ORDER — PANTOPRAZOLE SODIUM 40 MG PO TBEC
40.0000 mg | DELAYED_RELEASE_TABLET | Freq: Every day | ORAL | Status: DC
  Administered 2018-09-07 – 2018-09-08 (×2): 40 mg via ORAL
  Filled 2018-09-06 (×2): qty 1

## 2018-09-06 MED ORDER — DEXTROSE 50 % IV SOLN: 1.0000 | INTRAVENOUS | Status: AC

## 2018-09-06 MED ORDER — DEXTROSE 50 % IV SOLN: 1.0000 | Freq: Once | INTRAVENOUS | Status: DC

## 2018-09-06 MED ORDER — LORATADINE 10 MG PO TABS
10.0000 mg | ORAL_TABLET | Freq: Every day | ORAL | Status: DC
  Administered 2018-09-07 – 2018-09-08 (×2): 10 mg via ORAL
  Filled 2018-09-06 (×2): qty 1

## 2018-09-06 MED ORDER — MECLIZINE HCL 25 MG PO TABS
50.0000 mg | ORAL_TABLET | Freq: Once | ORAL | Status: AC
  Administered 2018-09-06: 50 mg via ORAL
  Filled 2018-09-06: qty 2

## 2018-09-06 MED ORDER — ACETAMINOPHEN 650 MG RE SUPP: 650.0000 mg | RECTAL | Status: DC | PRN

## 2018-09-06 MED ORDER — ACETAMINOPHEN 325 MG PO TABS
650.0000 mg | ORAL_TABLET | ORAL | Status: DC | PRN
  Administered 2018-09-07 – 2018-09-08 (×5): 650 mg via ORAL
  Filled 2018-09-06 (×5): qty 2

## 2018-09-06 MED ORDER — ATORVASTATIN CALCIUM 80 MG PO TABS
80.0000 mg | ORAL_TABLET | Freq: Every day | ORAL | Status: DC
  Administered 2018-09-07: 80 mg via ORAL
  Filled 2018-09-06: qty 1

## 2018-09-06 MED ORDER — ACETAMINOPHEN 160 MG/5ML PO SOLN: 650.0000 mg | ORAL | Status: DC | PRN

## 2018-09-06 MED ORDER — NICOTINE 21 MG/24HR TD PT24
21.0000 mg | MEDICATED_PATCH | Freq: Every day | TRANSDERMAL | Status: DC
  Administered 2018-09-07 – 2018-09-08 (×2): 21 mg via TRANSDERMAL
  Filled 2018-09-06 (×2): qty 1

## 2018-09-06 MED ORDER — ENOXAPARIN SODIUM 40 MG/0.4ML ~~LOC~~ SOLN
40.0000 mg | SUBCUTANEOUS | Status: DC
  Administered 2018-09-07 – 2018-09-08 (×2): 40 mg via SUBCUTANEOUS
  Filled 2018-09-06 (×2): qty 0.4

## 2018-09-06 MED ORDER — SODIUM CHLORIDE 0.9 % IV SOLN
INTRAVENOUS | Status: DC
  Administered 2018-09-06: 22:00:00 via INTRAVENOUS

## 2018-09-06 MED ORDER — HYDROCHLOROTHIAZIDE 25 MG PO TABS
25.0000 mg | ORAL_TABLET | Freq: Every day | ORAL | Status: DC
  Administered 2018-09-07 – 2018-09-08 (×2): 25 mg via ORAL
  Filled 2018-09-06 (×2): qty 1

## 2018-09-06 MED ORDER — ASPIRIN 300 MG RE SUPP: 300.0000 mg | Freq: Every day | RECTAL | Status: DC

## 2018-09-06 MED ORDER — METOPROLOL SUCCINATE ER 25 MG PO TB24
50.0000 mg | ORAL_TABLET | Freq: Every day | ORAL | Status: DC
  Administered 2018-09-07 – 2018-09-08 (×2): 50 mg via ORAL
  Filled 2018-09-06 (×2): qty 2

## 2018-09-06 MED ORDER — STROKE: EARLY STAGES OF RECOVERY BOOK
Freq: Once | Status: DC
  Filled 2018-09-06: qty 1

## 2018-09-06 MED ORDER — IOHEXOL 350 MG/ML SOLN
75.0000 mL | Freq: Once | INTRAVENOUS | Status: AC | PRN
  Administered 2018-09-06: 75 mL via INTRAVENOUS
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
## 2018-09-06 NOTE — ED Triage Notes (Signed)
Pt in from home via [facility] with c/o dizziness x 1 day, HA 1 wk, emesis since his am. States he has had poor oral intake x past few days.

## 2018-09-06 NOTE — ED Notes (Signed)
ED TO INPATIENT HANDOFF REPORT  ED Nurse Name and Phone #: Alycia RossettiRyan, [phone]  S Name/Age/Gender Kristopher Ruiz 68 y.o. male Room/Bed: 017C/017C  Code Status   Code Status: Full Code  Home/SNF/Other Home Patient oriented to: self, place, time and situation Is this baseline? Yes   Triage Complete: Triage complete  Chief Complaint dizzy  Triage Note Pt in from home via [facility] with c/o dizziness x 1 day, HA 1 wk, emesis since his am. States he has had poor oral intake x past few days.   Allergies Allergies  Allergen Reactions  . Ibuprofen Other (See Comments)    GI Problems    Level of Care/Admitting Diagnosis ED Disposition    ED Disposition Condition Comment   Admit  [facility]ospital [facility] [[id number]]  Level of Care: Telemetry Medical [104]  Covid Evaluation: Screening Protocol (No Symptoms)  Diagnosis: CVA (cerebral vascular accident) [facility](HCC) [[id number]][[id number]]  Admitting Physician: Clydie BraunSMITH, RONDELL A [[id number]]  Attending Physician: Clydie BraunSMITH, RONDELL A [[id number]]  Estimated length of stay: past midnight tomorrow  Certification:: I certify this patient will need inpatient services for at least 2 midnights  PT Class (Do Not Modify): Inpatient [101]  PT Acc Code (Do Not Modify): Private [1]       B Medical/Surgery History Past Medical History:  Diagnosis Date  . Hypertension    Past Surgical History:  Procedure Laterality Date  . HIP FRACTURE SURGERY    . LEG AMPUTATION BELOW KNEE       A IV Location/Drains/Wounds Patient Lines/Drains/Airways Status   Active Line/Drains/Airways    Name:   Placement date:   Placement time:   Site:   Days:   Peripheral IV 09/06/18 Left Antecubital   09/06/18    1226    Antecubital   less than 1          Intake/Output Last 24 hours No intake or output data in the 24 hours ending 09/06/18 1459  Labs/Imaging Results for orders placed or performed during the hospital encounter of 09/06/18 (from the past 48 hour(s))  Basic  metabolic panel     Status: Abnormal   Collection Time: 09/06/18  9:50 AM  Result Value Ref Range   Sodium 138 135 - 145 mmol/L   Potassium 4.1 3.5 - 5.1 mmol/L   Chloride 100 98 - 111 mmol/L   CO2 26 22 - 32 mmol/L   Glucose, Bld 140 (H) 70 - 99 mg/dL   BUN 12 8 - 23 mg/dL   Creatinine, Ser 6.290.79 0.61 - 1.24 mg/dL   Calcium 9.9 8.9 - 52.810.3 mg/dL   GFR calc non Af Amer >60 >60 mL/min   GFR calc Af Amer >60 >60 mL/min   Anion gap 12 5 - 15    Comment: Performed at [facility], [address] [place], [place] [address]  CBC     Status: None   Collection Time: 09/06/18  9:50 AM  Result Value Ref Range   WBC 7.3 4.0 - 10.5 K/uL   RBC 5.38 4.22 - 5.81 MIL/uL   Hemoglobin 14.5 13.0 - 17.0 g/dL   HCT 40.144.5 02.739.0 - 25.352.0 %   MCV 82.7 80.0 - 100.0 fL   MCH 27.0 26.0 - 34.0 pg   MCHC 32.6 30.0 - 36.0 g/dL   RDW 66.415.0 40.311.5 - 47.415.5 %   Platelets 246 150 - 400 K/uL   nRBC 0.0 0.0 - 0.2 %    Comment: Performed at [facility],  [address] [place], [place] [address]  CBG monitoring, ED     Status: Abnormal   Collection Time: 09/06/18 12:28 PM  Result Value Ref Range   Glucose-Capillary 51 (L) 70 - 99 mg/dL  Ethanol     Status: None   Collection Time: 09/06/18 12:30 PM  Result Value Ref Range   Alcohol, Ethyl (B) <10 <10 mg/dL    Comment: (NOTE) Lowest detectable limit for serum alcohol is 10 mg/dL. For medical purposes only. Performed at [facility], [place] [address] [place], [place] [address]   Protime-INR     Status: None   Collection Time: 09/06/18 12:30 PM  Result Value Ref Range   Prothrombin Time 13.8 11.4 - 15.2 seconds   INR 1.1 0.8 - 1.2    Comment: (NOTE) INR goal varies based on device and disease states. Performed at [facility], [place] [address] [place], [place] [address]   APTT     Status: None   Collection Time: 09/06/18 12:30 PM  Result Value Ref Range   aPTT 25 24 - 36 seconds    Comment: Performed at [facility]  [address] [place], [place] [address]   Mr Brain Wo Contrast  Result Date: 09/06/2018 CLINICAL DATA:  Acute presentation with dizziness. EXAM: MRI HEAD WITHOUT CONTRAST TECHNIQUE: Multiplanar, multiecho pulse sequences of the brain and surrounding structures were obtained without intravenous contrast. COMPARISON:  None. FINDINGS: Brain: There is acute infarction within the right cerebellum consistent with most of PICA distribution. Infarction does extend to the medial aspect of the superior cerebellum. This could indicate variations in vascular developmental supply, or possibly also some involvement of the superior cerebellar territory. The area approaches 5 cm in size. There is mild swelling but no hemorrhage or mass effect. Cerebral hemispheres show mild chronic small-vessel change of the deep white matter. No cortical or large vessel territory abnormality the supratentorial brain. No mass lesion, hemorrhage, hydrocephalus or extra-axial collection. Vascular: Major vessels at the base of the brain show flow. Skull and upper cervical spine: Negative Sinuses/Orbits: Clear/normal Other: None IMPRESSION: Acute infarction affecting the right cerebellar hemisphere with mild swelling but no hemorrhage or mass effect. No ventricular obstruction. 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 Some extension to the medial superior cerebellum could indicate variant vascular territory distribution or involvement of the second vessel, the right superior cerebellar artery. Electronically Signed   By: Nelson Chimes M.D.   On: 09/06/2018 13:44    Pending Labs Unresulted Labs (From admission, onward)    Start     Ordered   09/07/18 0500  Hemoglobin A1c  Tomorrow morning,   R     09/06/18 1449   09/07/18 0500  Lipid panel  Tomorrow morning,   R    Comments:  Fasting    09/06/18 1449   09/06/18 1446  HIV antibody (Routine Testing)  Once,   R     09/06/18 1449   09/06/18 1441  SARS  Coronavirus 2 (CEPHEID - Performed in [place] hospital lab), Hosp Order  (Asymptomatic Patients Labs)  Once,   R    Question:  Rule Out  Answer:  Yes   09/06/18 1440   09/06/18 1230  CBC with Differential/Platelet  Once,   R     09/06/18 1230   09/06/18 1151  Urine rapid drug screen (hosp performed)  ONCE - STAT,   STAT     09/06/18 1153  09/06/18 0940  Urinalysis, Routine w reflex microscopic  Once,   STAT     09/06/18 0940          Vitals/Pain Today's Vitals   09/06/18 1145 09/06/18 1215 09/06/18 1230 09/06/18 1401  BP: (!) 148/90 (!) 149/102 (!) 156/110 (!) 157/96  Pulse: (!) 44   65  Resp: 13 18 18 14   Temp:      TempSrc:      SpO2: 100%   96%  Weight:        Isolation Precautions No active isolations  Medications Medications  diltiazem (TIAZAC) 24 hr capsule 120 mg (has no administration in time range)  hydrochlorothiazide (HYDRODIURIL) tablet 25 mg (has no administration in time range)  metoprolol succinate (TOPROL-XL) 24 hr tablet 50 mg (has no administration in time range)  atorvastatin (LIPITOR) tablet 80 mg (has no administration in time range)  pantoprazole (PROTONIX) EC tablet 40 mg (has no administration in time range)  loratadine (CLARITIN) tablet 10 mg (has no administration in time range)   stroke: mapping our early stages of recovery book (has no administration in time range)  0.9 %  sodium chloride infusion (has no administration in time range)  acetaminophen (TYLENOL) tablet 650 mg (has no administration in time range)    Or  acetaminophen (TYLENOL) solution 650 mg (has no administration in time range)    Or  acetaminophen (TYLENOL) suppository 650 mg (has no administration in time range)  senna-docusate (Senokot-S) tablet 1 tablet (has no administration in time range)  enoxaparin (LOVENOX) injection 40 mg (has no administration in time range)  aspirin suppository 300 mg (has no administration in time range)    Or  aspirin tablet 325 mg (has no  administration in time range)  meclizine (ANTIVERT) tablet 50 mg (50 mg Oral Given 09/06/18 1229)    Mobility walks Low fall risk   Focused Assessments    R Recommendations: See Admitting Provider Note  Report given to:   Additional Notes:

## 2018-09-06 NOTE — ED Provider Notes (Signed)
[facility] Provider Note   CSN: [id number] Arrival date & time: 09/06/18  0932    History   Chief Complaint Chief Complaint  Patient presents with  . Dizziness  . Headache  . Emesis    HPI Kristopher Ruiz is a 68 y.o. male with h/o HTN and left BKA here for evaluation of dizziness described as "swimmy headed" sudden onset last night, persistent, intermittent, worse with standing up and trying to walk. States things start to move when he tries to walk and he has to hold on to the walls to walk. He is afraid he will fall. Associated with nausea and nbnb emesis.  No associated headache, vision loss or diplopia, neck pain or stiffness, difficulty with speech or swallowing, distal numbness or weakness, palpitations, CP, SOB, syncope. No recent head trauma. No recent URI illness. No interventions. Slightly alleviated with rest and closing his eyes. No h/o vertigo. No anticoagulants.      HPI  Past Medical History:  Diagnosis Date  . Hypertension     Patient Active Problem List   Diagnosis Date Noted  . CVA (cerebral vascular accident) (HCC) 09/06/2018  . Below knee amputation status, left 04/16/2017    Past Surgical History:  Procedure Laterality Date  . HIP FRACTURE SURGERY    . LEG AMPUTATION BELOW KNEE          Home Medications    Prior to Admission medications   Medication Sig Start Date End Date Taking? Authorizing Provider  aspirin 81 MG tablet Take 81 mg by mouth daily.    [provider]  atorvastatin (LIPITOR) 20 MG tablet Take 20 mg by mouth daily.    [provider]  diltiazem (TIAZAC) 120 MG 24 hr capsule Take 120 mg by mouth daily.    [provider]  gabapentin (NEURONTIN) 100 MG capsule Take 1 capsule by mouth at bedtime x 1 week, then TWICE DAILY x 1 week, then THREE TIMES DAILY] 05/25/17   Nadara Mustarduda, Marcus V, MD  hydrochlorothiazide (HYDRODIURIL) 25 MG tablet Take 25 mg by mouth daily.    [provider]  loratadine (CLARITIN) 10 MG tablet Take 10 mg by mouth daily.    [provider]  metoprolol succinate (TOPROL-XL) 50 MG 24 hr tablet Take 50 mg by mouth daily. Take with or immediately following a meal.    [provider]  naproxen (NAPROSYN) 500 MG tablet Take 500 mg by mouth 2 (two) times daily with a meal.    [provider]  omeprazole (PRILOSEC) 40 MG capsule Take 40 mg by mouth daily.    [provider]  trolamine salicylate (ASPERCREME/ALOE) 10 % cream Apply topically as needed. 07/03/12   Calvert Cantorizwan, Saima, MD    Family History No family history on file.  Social History Social History   Tobacco Use  . Smoking status: Current Every Day Smoker    Types: Cigarettes  . Smokeless tobacco: Never Used  Substance Use Topics  . Alcohol use: No  . Drug use: No     Allergies   Ibuprofen   Review of Systems Review of Systems  Gastrointestinal: Positive for nausea and vomiting.  Neurological: Positive for dizziness.  All other systems reviewed and are negative.    Physical Exam Updated Vital Signs BP (!) 157/96 (BP Location: Right Arm)   Pulse 65   Temp 98.1 F (36.7 C) (Oral)   Resp 14   Wt 74.4 kg   SpO2 96%  BMI 29.06 kg/m   Physical Exam Vitals signs and nursing note reviewed.  Constitutional:      General: He is not in acute distress.    Appearance: He is well-developed.     Comments: NAD. Keeps eyes closed during exam.   HENT:     Head: Normocephalic and atraumatic.     Right Ear: External ear normal.     Left Ear: External ear normal.     Nose: Nose normal.  Eyes:     General: No scleral icterus.    Extraocular Movements:     Right eye: Nystagmus present.     Left eye: Abnormal extraocular motion and nystagmus present.     Conjunctiva/sclera: Conjunctivae normal.     Comments: Left horizontal nystagmus. Strabismus with left eye deviated medially. Asymmetric but round pupils L>R  Neck:      Musculoskeletal: Normal range of motion and neck supple.     Comments: C-spine: No midline tenderness.  No paraspinal muscle tenderness.  Full range of motion of the neck without pain.  No meningismus. Cardiovascular:     Rate and Rhythm: Normal rate and regular rhythm.     Heart sounds: Normal heart sounds.  Pulmonary:     Effort: Pulmonary effort is normal.     Breath sounds: Normal breath sounds.  Musculoskeletal: Normal range of motion.        General: No deformity.  Skin:    General: Skin is warm and dry.     Capillary Refill: Capillary refill takes less than 2 seconds.  Neurological:     Mental Status: He is alert and oriented to person, place, and time.     Gait: Gait abnormal.     Comments:  Alert and oriented to self, place, time and event.  Speech is fluent without dysarthria or dysphasia. Strength 5/5 and strength intact in upper/lower extremities and face.  Needs assistance standing up from bed due to dizziness. Able to stand with wide stance without assistance. Needs one person assist to take 2 steps in ER due to dizziness.  No truncal sway.  No pronator drift. No leg drop. Normal finger-to-nose.  CN I not tested CN II grossly intact visual fields bilaterally. Unable to visualize posterior eye. CN III, IV, VI asymmetric round pupils L>R, left eye deviated medially (chronic per patient)  CN V light touch intact in all 3 divisions of trigeminal nerve CN VII facial movements symmetric CN VIII not tested CN IX, X no uvula deviation, symmetric rise of soft palate  CN XI 5/5 SCM and trapezius strength bilaterally  CN XII Midline tongue protrusion, symmetric L/R movements LEFT horizontal nystagmus  Psychiatric:        Behavior: Behavior normal.        Thought Content: Thought content normal.        Judgment: Judgment normal.      ED Treatments / Results  Labs (all labs ordered are listed, but only abnormal results are displayed) Labs Reviewed  BASIC METABOLIC PANEL  - Abnormal; Notable for the following components:      Result Value   Glucose, Bld 140 (*)    All other components within normal limits  CBG MONITORING, ED - Abnormal; Notable for the following components:   Glucose-Capillary 51 (*)    All other components within normal limits  SARS CORONAVIRUS 2 (HOSPITAL ORDER, [place] [facility])  CBC  ETHANOL  PROTIME-INR  APTT  URINALYSIS, ROUTINE W REFLEX MICROSCOPIC  RAPID URINE  DRUG SCREEN, HOSP PERFORMED  CBC WITH DIFFERENTIAL/PLATELET  HIV ANTIBODY (ROUTINE TESTING W REFLEX)  CBG MONITORING, ED    EKG EKG Interpretation  Date/Time:  Monday September 06 2018 11:38:18 EDT Ventricular Rate:  68 PR Interval:  138 QRS Duration: 92 QT Interval:  368 QTC Calculation: 392 R Axis:   84 Text Interpretation:  Sinus rhythm Ventricular trigeminy Borderline right axis deviation Nonspecific T abnrm, anterolateral leads Confirmed by Pricilla LovelessGoldston, Scott [phone]([id number]) on 09/06/2018 11:41:08 AM   Radiology Mr Brain Wo Contrast  Result Date: 09/06/2018 CLINICAL DATA:  Acute presentation with dizziness. EXAM: MRI HEAD WITHOUT CONTRAST TECHNIQUE: Multiplanar, multiecho pulse sequences of the brain and surrounding structures were obtained without intravenous contrast. COMPARISON:  None. FINDINGS: Brain: There is acute infarction within the right cerebellum consistent with most of PICA distribution. Infarction does extend to the medial aspect of the superior cerebellum. This could indicate variations in vascular developmental supply, or possibly also some involvement of the superior cerebellar territory. The area approaches 5 cm in size. There is mild swelling but no hemorrhage or mass effect. Cerebral hemispheres show mild chronic small-vessel change of the deep white matter. No cortical or large vessel territory abnormality the supratentorial brain. No mass lesion, hemorrhage, hydrocephalus or extra-axial collection. Vascular: Major vessels at the base of the  brain show flow. Skull and upper cervical spine: Negative Sinuses/Orbits: Clear/normal Other: None IMPRESSION: Acute infarction affecting the right cerebellar hemisphere with mild swelling but no hemorrhage or mass effect. No ventricular obstruction. 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 Some extension to the medial superior cerebellum could indicate variant vascular territory distribution or involvement of the second vessel, the right superior cerebellar artery. Electronically Signed   By: Paulina FusiMark  Shogry M.D.   On: 09/06/2018 13:44    Procedures .Critical Care Performed by: Liberty HandyGibbons, Hans Rusher J, PA-C Authorized by: Liberty HandyGibbons, Tasheena Wambolt J, PA-C   Critical care provider statement:    Critical care time (minutes):  45   Critical care was necessary to treat or prevent imminent or life-threatening deterioration of the following conditions:  CNS failure or compromise (stroke)   Critical care was time spent personally by me on the following activities:  Discussions with consultants, evaluation of patient's response to treatment, examination of patient, ordering and performing treatments and interventions, ordering and review of laboratory studies, ordering and review of radiographic studies, pulse oximetry, re-evaluation of patient's condition, obtaining history from patient or surrogate and review of old charts   I assumed direction of critical care for this patient from another provider in my specialty: no     (including critical care time)  Medications Ordered in ED Medications  diltiazem (TIAZAC) 24 hr capsule 120 mg (has no administration in time range)  hydrochlorothiazide (HYDRODIURIL) tablet 25 mg (has no administration in time range)  metoprolol succinate (TOPROL-XL) 24 hr tablet 50 mg (has no administration in time range)  atorvastatin (LIPITOR) tablet 80 mg (has no administration in time range)  pantoprazole (PROTONIX) EC tablet 40 mg (has no  administration in time range)  loratadine (CLARITIN) tablet 10 mg (has no administration in time range)   stroke: mapping our early stages of recovery book (has no administration in time range)  0.9 %  sodium chloride infusion (has no administration in time range)  acetaminophen (TYLENOL) tablet 650 mg (has no administration in time range)    Or  acetaminophen (TYLENOL) solution 650 mg (has no administration in time range)    Or  acetaminophen (  TYLENOL) suppository 650 mg (has no administration in time range)  senna-docusate (Senokot-S) tablet 1 tablet (has no administration in time range)  enoxaparin (LOVENOX) injection 40 mg (has no administration in time range)  aspirin suppository 300 mg (has no administration in time range)    Or  aspirin tablet 325 mg (has no administration in time range)  meclizine (ANTIVERT) tablet 50 mg (50 mg Oral Given 09/06/18 1229)  iohexol (OMNIPAQUE) 350 MG/ML injection 75 mL (75 mLs Intravenous Contrast Given 09/06/18 1503)     Initial Impression / Assessment and Plan / ED Course  I have reviewed the triage vital signs and the nursing notes.  Pertinent labs & imaging results that were available during my care of the patient were reviewed by me and considered in my medical decision making (see chart for details).  Clinical Course as of Sep 06 1514  Mon Sep 06, 2018  1150 Sinus rhythm Ventricular trigeminy Borderline right axis deviation Nonspecific T abnrm, anterolateral leads  EKG 12-Lead [CG]  1416 Acute infarction affecting the right cerebellar hemisphere with mild swelling but no hemorrhage or mass effect. No ventricular obstruction. 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 Some extension to the medial superior cerebellum could indicate variant vascular territory distribution or involvement of the second vessel, the right superior cerebellar artery.  MR BRAIN WO CONTRAST [CG]    Clinical Course User Index  [CG] Liberty HandyGibbons, Aubrey Voong J, PA-C   Concern for peripheral vs central nervous system etiology. He has difficulty ambulating on his own, horizontal nystagmus. Exam limited due to strabismus.  Will obtain labs, EKG, MRI head to r/u cerebellar issue.  Final Clinical Impressions(s) / ED Diagnoses   MRI shows acute infarction of the right cerebellar hemisphere.  Discussed results with patient.  Spoke to Dr. Amada JupiterKirkpatrick with neurology who recommends CT angiography head/neck which is pending, no other immediate neurological recommendations otherwise.  Discussed with Dr. Katrinka BlazingSmith who is accepted patient.  Shared with EDP. COVID, UDS pending.  Final diagnoses:  Cerebellar stroke [facility](HCC)    ED Discharge Orders    None       Liberty HandyGibbons, Safir Michalec J, PA-C 09/06/18 1516    Pricilla LovelessGoldston, Scott, MD 09/09/18 [phone]

## 2018-09-07 ENCOUNTER — Inpatient Hospital Stay (HOSPITAL_COMMUNITY): Payer: Medicare Other

## 2018-09-07 DIAGNOSIS — I639 Cerebral infarction, unspecified: Secondary | ICD-10-CM

## 2018-09-07 DIAGNOSIS — S88112A Complete traumatic amputation at level between knee and ankle, left lower leg, initial encounter: Secondary | ICD-10-CM

## 2018-09-07 DIAGNOSIS — T383X5A Adverse effect of insulin and oral hypoglycemic [antidiabetic] drugs, initial encounter: Secondary | ICD-10-CM

## 2018-09-07 LAB — LIPID PANEL
Cholesterol: 188 mg/dL (ref 0–200)
HDL: 52 mg/dL (ref >40)
LDL Cholesterol: 120 mg/dL — ABNORMAL HIGH (ref 0–99)
Total CHOL/HDL Ratio: 3.6 RATIO
Triglycerides: 82 mg/dL (ref <150)
VLDL: 16 mg/dL (ref 0–40)

## 2018-09-07 LAB — ECHOCARDIOGRAM COMPLETE
Height: 63 in
Weight: 2624.36 oz

## 2018-09-07 LAB — GLUCOSE, CAPILLARY: Glucose-Capillary: 89 mg/dL (ref 70–99)

## 2018-09-07 LAB — HIV ANTIBODY (ROUTINE TESTING W REFLEX): HIV Screen 4th Generation wRfx: NONREACTIVE

## 2018-09-07 LAB — HEMOGLOBIN A1C
Hgb A1c MFr Bld: 6.2 % — ABNORMAL HIGH (ref 4.8–5.6)
Mean Plasma Glucose: 131.24 mg/dL

## 2018-09-07 MED ORDER — ASPIRIN EC 81 MG PO TBEC
81.0000 mg | DELAYED_RELEASE_TABLET | Freq: Every day | ORAL | Status: DC
  Administered 2018-09-08: 81 mg via ORAL
  Filled 2018-09-07: qty 1

## 2018-09-07 MED ORDER — CLOPIDOGREL BISULFATE 75 MG PO TABS
75.0000 mg | ORAL_TABLET | Freq: Every day | ORAL | Status: DC
  Administered 2018-09-07 – 2018-09-08 (×2): 75 mg via ORAL
  Filled 2018-09-07 (×2): qty 1

## 2018-09-07 NOTE — Progress Notes (Signed)
PROGRESS NOTE    Kristopher Ruiz  VHQ:[id number] DOB: 05/20/1950 DOA: 09/06/2018 PCP: Patient, No Pcp Per    Brief Narrative:  68 year old gentleman with history of hypertension, hyperlipidemia, left BKA after trauma who presented to the hospital with dizziness onset more than 24 hours.  No history of previous stroke.  Patient was having dizzy spells whenever he was trying to get up and move around.  In the emergency room, patient was hemodynamically stable.  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   Assessment & Plan:   Principal Problem:   CVA (cerebral vascular accident) (HCC) Active Problems:   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HCC)   Hypoglycemia due to insulin   Tobacco abuse   Hyperlipidemia   Essential hypertension  Acute posterior circulation infarction: Continue to monitor in the stroke unit because of severity of symptoms. Neurochecks and vital signs as per stroke protocol. Patient was not a TPA and vascular intervention candidate because of minimal neuro deficits Swallow evaluation normal. Antiplatelets, aspirin Plavix for 3 months because of significant intracranial and extracranial stenosis. Statin, with Lipitor high intensity. Blood pressure are at goals. Consultations, neurology, speech, PT OT.  Will refer for inpatient rehab. 2D echocardiogram was normal.  Please send to the is funny HbA1C 6.2, prediabetes, will start on metformin on discharge. LDL 120  hypoglycemia: Cause unknown.  Patient's A1c is 6.2.  Early prediabetes.  Not on any treatment.  Will allow regular diet.  Hypertension: Blood pressures are fairly stable.  Resumed on diltiazem, hydrochlorothiazide and metoprolol.  Smoker: Counseled to quit.  Provided nicotine patch.   DVT prophylaxis: Lovenox subcu Code Status: Full code Family Communication: None Disposition Plan: Needs continued hospitalization.  Anticipate discharge to acute  rehab.   Consultants:   Neurology  Procedures:   None  Antimicrobials:   None   Subjective: Patient was seen and examined.  He has no complaints when laying back on the bed.  He feels dizzy and blurry vision whenever he tries to get out of the bed.  No other overnight events.  Objective: Vitals:   09/07/18 0022 09/07/18 0402 09/07/18 0735 09/07/18 1139  BP: (!) 134/97 138/88 139/87 (!) 156/96  Pulse: 98 93 85 66  Resp: Temp: 98.9 F (37.2 C) 98.8 F (37.1 C) 98.7 F (37.1 C) 98.5 F (36.9 C)  TempSrc: Oral Oral Oral Oral  SpO2: 100% 98% 99% 100%  Weight:      Height:        Intake/Output Summary (Last 24 hours) at 09/07/2018 1346 Last data filed at 09/07/2018 0736 Gross per 24 hour  Intake 240 ml  Output 550 ml  Net -310 ml   Filed Weights   09/06/18 0939  Weight: 74.4 kg    Examination:  General exam: Appears calm and comfortable  Respiratory system: Clear to auscultation. Respiratory effort normal. Cardiovascular system: S1 & S2 heard, RRR. No JVD, murmurs, rubs, gallops or clicks. No pedal edema. Gastrointestinal system: Abdomen is nondistended, soft and nontender. No organomegaly or masses felt. Normal bowel sounds heard. Central nervous system: Alert and oriented. No focal neurological deficits.  Left eye with horizontal nystagmus. Extremities: Symmetric 5 x 5 power.  Left below-knee amputation. Skin: No rashes, lesions or ulcers Psychiatry: Judgement and insight appear normal. Mood & affect appropriate.     Data Reviewed: I have personally reviewed following labs and imaging studies  CBC: Recent Labs  Lab 09/06/18 0950 09/06/18  1644  WBC 7.3 9.0  NEUTROABS  --  6.5  HGB 14.5 14.3  HCT 44.5 43.2  MCV 82.7 82.6  PLT 246 400   Basic Metabolic Panel: Recent Labs  Lab 09/06/18 0950  NA 138  K 4.1  CL 100  CO2 26  GLUCOSE 140*  BUN 12  CREATININE 0.79  CALCIUM 9.9   GFR: Estimated Creatinine Clearance: 79.9 mL/min (by  C-G formula based on SCr of 0.79 mg/dL). Liver Function Tests: No results for input(s): AST, ALT, ALKPHOS, BILITOT, PROT, ALBUMIN in the last 168 hours. No results for input(s): LIPASE, AMYLASE in the last 168 hours. No results for input(s): AMMONIA in the last 168 hours. Coagulation Profile: Recent Labs  Lab 09/06/18 1230  INR 1.1   Cardiac Enzymes: No results for input(s): CKTOTAL, CKMB, CKMBINDEX, TROPONINI in the last 168 hours. BNP (last 3 results) No results for input(s): PROBNP in the last 8760 hours. HbA1C: Recent Labs    09/07/18 0410  HGBA1C 6.2*   CBG: Recent Labs  Lab 09/06/18 1228 09/06/18 1507 09/06/18 2010 09/07/18 0025  GLUCAP 51* 92 79 89   Lipid Profile: Recent Labs    09/07/18 0410  CHOL 188  HDL 52  LDLCALC 120*  TRIG 82  CHOLHDL 3.6   Thyroid Function Tests: No results for input(s): TSH, T4TOTAL, FREET4, T3FREE, THYROIDAB in the last 72 hours. Anemia Panel: No results for input(s): VITAMINB12, FOLATE, FERRITIN, TIBC, IRON, RETICCTPCT in the last 72 hours. Sepsis Labs: No results for input(s): PROCALCITON, LATICACIDVEN in the last 168 hours.  Recent Results (from the past 240 hour(s))  SARS Coronavirus 2 (CEPHEID - Performed in [place] hospital lab), Hosp Order     Status: None   Collection Time: 09/06/18  3:08 PM  Result Value Ref Range Status   SARS Coronavirus 2 NEGATIVE NEGATIVE Final    Comment: (NOTE) If result is NEGATIVE SARS-CoV-2 target nucleic acids are NOT DETECTED. The SARS-CoV-2 RNA is generally detectable in upper and lower  respiratory specimens during the acute phase of infection. The lowest  concentration of SARS-CoV-2 viral copies this assay can detect is 250  copies / mL. A negative result does not preclude SARS-CoV-2 infection  and should not be used as the sole basis for treatment or other  patient management decisions.  A negative result may occur with  improper specimen collection / handling, submission of  specimen other  than nasopharyngeal swab, presence of viral mutation(s) within the  areas targeted by this assay, and inadequate number of viral copies  (<250 copies / mL). A negative result must be combined with clinical  observations, patient history, and epidemiological information. If result is POSITIVE SARS-CoV-2 target nucleic acids are DETECTED. The SARS-CoV-2 RNA is generally detectable in upper and lower  respiratory specimens dur ing the acute phase of infection.  Positive  results are indicative of active infection with SARS-CoV-2.  Clinical  correlation with patient history and other diagnostic information is  necessary to determine patient infection status.  Positive results do  not rule out bacterial infection or co-infection with other viruses. If result is PRESUMPTIVE POSTIVE SARS-CoV-2 nucleic acids MAY BE PRESENT.   A presumptive positive result was obtained on the submitted specimen  and confirmed on repeat testing.  While 2019 novel coronavirus  (SARS-CoV-2) nucleic acids may be present in the submitted sample  additional confirmatory testing may be necessary for epidemiological  and / or clinical management purposes  to differentiate between  SARS-CoV-2 and  other Sarbecovirus currently known to infect humans.  If clinically indicated additional testing with an alternate test  methodology [phone]([id number]) is advised. The SARS-CoV-2 RNA is generally  detectable in upper and lower respiratory sp ecimens during the acute  phase of infection. The expected result is Negative. Fact Sheet for Patients:  BoilerBrush.com.cyhttps://www.fda.gov/media/136312/download Fact Sheet for Healthcare Providers: https://pope.com/https://www.fda.gov/media/136313/download This test is not yet approved or cleared by the [place] FDA and has been authorized for detection and/or diagnosis of SARS-CoV-2 by FDA under an Emergency Use Authorization (EUA).  This EUA will remain in effect (meaning this test can be used) for the  duration of the COVID-19 declaration under Section 564(b)(1) of the Act, 21 U.S.C. section 360bbb-3(b)(1), unless the authorization is terminated or revoked sooner. Performed at [facility], [address] [place], [place] [address]          Radiology Studies: Ct Angio Head W Or Wo Contrast  Result Date: 09/06/2018 CLINICAL DATA:  Acute right cerebellar infarction.  Dizziness. EXAM: CT ANGIOGRAPHY HEAD AND NECK TECHNIQUE: Multidetector CT imaging of the head and neck was performed using the standard protocol during bolus administration of intravenous contrast. Multiplanar CT image reconstructions and MIPs were obtained to evaluate the vascular anatomy. Carotid stenosis measurements (when applicable) are obtained utilizing NASCET criteria, using the distal internal carotid diameter as the denominator. CONTRAST:  75mL OMNIPAQUE IOHEXOL 350 MG/ML SOLN COMPARISON:  MRI same day FINDINGS: CT HEAD FINDINGS Brain: Low-density evident within the right cerebellum consistent with acute/subacute infarction. No hemorrhage. No fourth ventricular compromise. Cerebral hemispheres show mild small vessel change of the deep white matter without focal finding in that region. No hydrocephalus or extra-axial collection. Vascular: Negative Skull: Negative Sinuses: Negative Orbits: Negative Review of the MIP images confirms the above findings CTA NECK FINDINGS Aortic arch: Aortic atherosclerosis. No sign of aneurysm or dissection. Branching pattern is normal without brachiocephalic vessel origin flow limiting stenosis. Right carotid system: Common carotid artery is widely patent to the bifurcation. There is extensive calcified plaque at the carotid bifurcation and ICA bulb. Minimal diameter of the ICA in the bulb region is as narrow as 1.5 mm. Compared to a more distal cervical ICA diameter of 5 mm, this indicates a 70% stenosis. Left carotid system: Common carotid artery widely patent to the bifurcation. Soft and  calcified plaque at the carotid bifurcation and ICA bulb. Diameter of the ICA bulb as narrow as 1.5 mm. Compared to a more distal cervical ICA diameter of 5 mm, this indicates a 70% stenosis. Vertebral arteries: The left vertebral artery origin is widely patent. This non dominant vessel is widely patent through the cervical region to the foramen magnum. There is atherosclerotic disease at the dominant right vertebral artery origin with stenosis estimated at 30%. There is scattered plaque within the proximal portion of the vessel. The vessel appears occluded in the lower cervical region. No antegrade flow visible. There is some reconstituted flow at the foramen magnum level. Skeleton: Ordinary degenerative cervical spondylosis and facet arthropathy. Other neck: Negative Upper chest: Emphysema and pulmonary scarring.  No active process. Review of the MIP images confirms the above findings CTA HEAD FINDINGS Anterior circulation: Both internal carotid arteries are patent through the skull base and siphon regions. There is siphon calcification but no stenosis greater than 30%. The anterior and middle cerebral vessels are patent without proximal stenosis, aneurysm or vascular malformation. Posterior circulation: The left vertebral artery terminates in PICA. No visible supply to the basilar. The reconstituted  right vertebral artery does show patency at the foramen magnum level to the basilar. I do not identify a patent right PICA. There is a patent anterior inferior cerebellar artery on the right. There is no basilar stenosis. There are patent bilateral superior cerebellar arteries and posterior cerebral arteries. Venous sinuses: Patent and normal. Anatomic variants: None other significant. Delayed phase: Not performed. 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  Some antegrade flow in the distal right vertebral artery which appears to be the only supply to the basilar. No visible right PICA. Right anterior inferior cerebellar artery, both superior cerebellar arteries and both posterior cerebral arteries do show flow. The small left vertebral artery terminates in left PICA. Atherosclerotic disease at both carotid bifurcations and internal carotid artery bulbs. 70% stenoses present in both ICA bulbs. Electronically Signed   By: Paulina FusiMark  Shogry M.D.   On: 09/06/2018 15:15   Ct Angio Neck W And/or Wo Contrast  Result Date: 09/06/2018 CLINICAL DATA:  Acute right cerebellar infarction.  Dizziness. EXAM: CT ANGIOGRAPHY HEAD AND NECK TECHNIQUE: Multidetector CT imaging of the head and neck was performed using the standard protocol during bolus administration of intravenous contrast. Multiplanar CT image reconstructions and MIPs were obtained to evaluate the vascular anatomy. Carotid stenosis measurements (when applicable) are obtained utilizing NASCET criteria, using the distal internal carotid diameter as the denominator. CONTRAST:  75mL OMNIPAQUE IOHEXOL 350 MG/ML SOLN COMPARISON:  MRI same day FINDINGS: CT HEAD FINDINGS Brain: Low-density evident within the right cerebellum consistent with acute/subacute infarction. No hemorrhage. No fourth ventricular compromise. Cerebral hemispheres show mild small vessel change of the deep white matter without focal finding in that region. No hydrocephalus or extra-axial collection. Vascular: Negative Skull: Negative Sinuses: Negative Orbits: Negative Review of the MIP images confirms the above findings CTA NECK FINDINGS Aortic arch: Aortic atherosclerosis. No sign of aneurysm or dissection. Branching pattern is normal without brachiocephalic vessel origin flow limiting stenosis. Right carotid system: Common carotid artery is widely patent to the bifurcation. There is extensive calcified plaque at the carotid bifurcation and ICA bulb. Minimal  diameter of the ICA in the bulb region is as narrow as 1.5 mm. Compared to a more distal cervical ICA diameter of 5 mm, this indicates a 70% stenosis. Left carotid system: Common carotid artery widely patent to the bifurcation. Soft and calcified plaque at the carotid bifurcation and ICA bulb. Diameter of the ICA bulb as narrow as 1.5 mm. Compared to a more distal cervical ICA diameter of 5 mm, this indicates a 70% stenosis. Vertebral arteries: The left vertebral artery origin is widely patent. This non dominant vessel is widely patent through the cervical region to the foramen magnum. There is atherosclerotic disease at the dominant right vertebral artery origin with stenosis estimated at 30%. There is scattered plaque within the proximal portion of the vessel. The vessel appears occluded in the lower cervical region. No antegrade flow visible. There is some reconstituted flow at the foramen magnum level. Skeleton: Ordinary degenerative cervical spondylosis and facet arthropathy. Other neck: Negative Upper chest: Emphysema and pulmonary scarring.  No active process. Review of the MIP images confirms the above findings CTA HEAD FINDINGS Anterior circulation: Both internal carotid arteries are patent through the skull base and siphon regions. There is siphon calcification but no stenosis greater than 30%. The anterior and middle cerebral vessels are patent  without proximal stenosis, aneurysm or vascular malformation. Posterior circulation: The left vertebral artery terminates in PICA. No visible supply to the basilar. The reconstituted right vertebral artery does show patency at the foramen magnum level to the basilar. I do not identify a patent right PICA. There is a patent anterior inferior cerebellar artery on the right. There is no basilar stenosis. There are patent bilateral superior cerebellar arteries and posterior cerebral arteries. Venous sinuses: Patent and normal. Anatomic variants: None other significant.  Delayed phase: Not performed. 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 Some antegrade flow in the distal right vertebral artery which appears to be the only supply to the basilar. No visible right PICA. Right anterior inferior cerebellar artery, both superior cerebellar arteries and both posterior cerebral arteries do show flow. The small left vertebral artery terminates in left PICA. Atherosclerotic disease at both carotid bifurcations and internal carotid artery bulbs. 70% stenoses present in both ICA bulbs. Electronically Signed   By: Paulina FusiMark  Shogry M.D.   On: 09/06/2018 15:15   Mr Brain Wo Contrast  Result Date: 09/06/2018 CLINICAL DATA:  Acute presentation with dizziness. EXAM: MRI HEAD WITHOUT CONTRAST TECHNIQUE: Multiplanar, multiecho pulse sequences of the brain and surrounding structures were obtained without intravenous contrast. COMPARISON:  None. FINDINGS: Brain: There is acute infarction within the right cerebellum consistent with most of PICA distribution. Infarction does extend to the medial aspect of the superior cerebellum. This could indicate variations in vascular developmental supply, or possibly also some involvement of the superior cerebellar territory. The area approaches 5 cm in size. There is mild swelling but no hemorrhage or mass effect. Cerebral hemispheres show mild chronic small-vessel change of the deep white matter. No cortical or large vessel territory abnormality the supratentorial brain. No mass lesion, hemorrhage, hydrocephalus or extra-axial collection. Vascular: Major vessels at the base of the brain show flow. Skull and upper cervical spine: Negative Sinuses/Orbits: Clear/normal Other: None IMPRESSION: Acute infarction affecting the right cerebellar hemisphere with mild swelling but no hemorrhage or mass effect. No  ventricular obstruction. 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 Some extension to the medial superior cerebellum could indicate variant vascular territory distribution or involvement of the second vessel, the right superior cerebellar artery. Electronically Signed   By: Paulina FusiMark  Shogry M.D.   On: 09/06/2018 13:44        Scheduled Meds:   stroke: mapping our early stages of recovery book   Does not apply Once   aspirin  300 mg Rectal Daily   Or   aspirin  325 mg Oral Daily   atorvastatin  80 mg Oral q1800   dextrose  1 ampule Intravenous STAT   diltiazem  120 mg Oral Daily   enoxaparin (LOVENOX) injection  40 mg Subcutaneous Q24H   gabapentin  100 mg Oral BID   hydrochlorothiazide  25 mg Oral Daily   loratadine  10 mg Oral Daily   metoprolol succinate  50 mg Oral Daily   nicotine  21 mg Transdermal Daily   pantoprazole  40 mg Oral Daily   Continuous Infusions:  sodium chloride 75 mL/hr at 09/06/18 2133     LOS: 1 day    Time spent: 25 minutes    Dorcas CarrowKuber Sumeet Geter, MD [facility] Pager [phone]  If 7PM-7AM, please contact night-coverage [email or web address] Password [email or web address] 09/07/2018, 1:46 PM

## 2018-09-07 NOTE — Evaluation (Signed)
Occupational Therapy Evaluation Patient Details Name: Kristopher Ruiz MRN: [id number] DOB: 1951-03-14 Today's Date: 09/07/2018    History of Present Illness Pt is a 68 y.o. male with medical history significant of HTN, HLD, left BKA, and history of trauma to the right; who presented with complaints of dizziness. 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   Clinical Impression   Pt presents to OT with balance deficits and visual disturbance. Without AD pt requires min A for functional mobility in room, and was able to progress to min guard/(S) with RW. Pt reports dizziness in his R eye and was observed to have a significantly smaller pupil, however all other visual exam items were [facility]. Recommend HHOT to follow up with pt to ensure carryover of edu and fall risk reduction. OT will continue to follow acutely.     Follow Up Recommendations  Home health OT    Equipment Recommendations  None recommended by OT    Recommendations for Other Services PT consult     Precautions / Restrictions Precautions Precautions: Fall Restrictions Weight Bearing Restrictions: No      Mobility Bed Mobility Overal bed mobility: Modified Independent                Transfers Overall transfer level: Needs assistance Equipment used: Rolling walker (2 wheeled) Transfers: Sit to/from Stand Sit to Stand: Supervision              Balance Overall balance assessment: Needs assistance Sitting-balance support: No upper extremity supported;Feet supported Sitting balance-Leahy Scale: Good     Standing balance support: No upper extremity supported;During functional activity Standing balance-Leahy Scale: Fair Standing balance comment: Fair-poor without UE support. With RW, fair balance                            ADL either performed or assessed  with clinical judgement   ADL Overall ADL's : Needs assistance/impaired Eating/Feeding: Independent   Grooming: Wash/dry hands;Wash/dry face;Standing;Modified independent   Upper Body Bathing: Modified independent;Sitting   Lower Body Bathing: Supervison/ safety;Sit to/from stand   Upper Body Dressing : Modified independent;Sitting   Lower Body Dressing: Supervision/safety;Sit to/from stand   Toilet Transfer: Min guard;RW   Toileting- Water quality scientist and Hygiene: Supervision/safety;Sit to/from stand       Functional mobility during ADLs: Min guard;Rolling walker General ADL Comments: Min A without AD, min guard- (S) with RW      Vision Baseline Vision/History: No visual deficits Patient Visual Report: Blurring of vision Vision Assessment?: Yes Eye Alignment: Impaired (comment) Ocular Range of Motion: Within Functional Limits Alignment/Gaze Preference: Within Defined Limits Tracking/Visual Pursuits: Able to track stimulus in all quads without difficulty Saccades: Within functional limits Convergence: Within functional limits Visual Fields: No apparent deficits Additional Comments: R pupil smaller than L. Pt reports dizzines and "swimmyness" in R eye      Perception Perception Perception Tested?: Yes Comments: No deficits    Praxis Praxis Praxis tested?: Within functional limits    Pertinent Vitals/Pain Pain Assessment: No/denies pain Pain Score: 7  Pain Location: Head Pain Descriptors / Indicators: Aching Pain Intervention(s): RN gave pain meds during session     Hand Dominance Right   Extremity/Trunk Assessment Upper Extremity Assessment Upper Extremity Assessment: Overall WFL for tasks assessed   Lower Extremity Assessment Lower Extremity Assessment: Defer to PT evaluation   Cervical /  Trunk Assessment Cervical / Trunk Assessment: Normal   Communication Communication Communication: No difficulties   Cognition Arousal/Alertness:  Awake/alert Behavior During Therapy: WFL for tasks assessed/performed Overall Cognitive Status: No family/caregiver present to determine baseline cognitive functioning Area of Impairment: Problem solving                             Problem Solving: Slow processing                Home Living Family/patient expects to be discharged to:: Private residence Living Arrangements: Spouse/significant other Available Help at Discharge: Friend(s);Available 24 hours/day Type of Home: House       Home Layout: One level     Bathroom Shower/Tub: Chief Strategy OfficerTub/shower unit   Bathroom Toilet: Standard     Home Equipment: Shower seat      Lives With: Significant other    Prior Functioning/Environment Level of Independence: Independent        Comments: Independent with donning prosthesis, used shower chair        OT Problem List: Pain;Decreased strength;Impaired balance (sitting and/or standing);Decreased coordination      OT Treatment/Interventions: Self-care/ADL training;Therapeutic exercise;Energy conservation;DME and/or AE instruction;Balance training;Patient/family education;Therapeutic activities    OT Goals(Current goals can be found in the care plan section) Acute Rehab OT Goals Patient Stated Goal: "get home" OT Goal Formulation: With patient Time For Goal Achievement: 09/13/18 Potential to Achieve Goals: Good  OT Frequency: Min 3X/week    AM-PAC OT "6 Clicks" Daily Activity     Outcome Measure Help from another person eating meals?: None Help from another person taking care of personal grooming?: None Help from another person toileting, which includes using toliet, bedpan, or urinal?: A Little Help from another person bathing (including washing, rinsing, drying)?: A Little Help from another person to put on and taking off regular upper body clothing?: None Help from another person to put on and taking off regular lower body clothing?: A Little 6 Click Score:  21   End of Session Equipment Utilized During Treatment: Rolling walker  Activity Tolerance: Patient tolerated treatment well Patient left: in chair;with call bell/phone within reach  OT Visit Diagnosis: Unsteadiness on feet (R26.81);Muscle weakness (generalized) (M62.81)                Time: [id number] OT Time Calculation (min): 16 min Charges:  OT General Charges $OT Visit: 1 Visit OT Evaluation $OT Eval Low Complexity: 1 Low  Crissie ReeseSandra H Torrence Hammack OTR/L  09/07/2018, 2:05 PM

## 2018-09-07 NOTE — Evaluation (Signed)
Speech Language Pathology Evaluation Patient Details Name: Kristopher Ruiz MRN: [id number] DOB: 04-19-1950 Today's Date: 09/07/2018 Time: 1040-1110 SLP Time Calculation (min) (ACUTE ONLY): 30 min  Problem List:  Patient Active Problem List   Diagnosis Date Noted  . CVA (cerebral vascular accident) ([place]) 09/06/2018  . Hypoglycemia due to insulin 09/06/2018  . Tobacco abuse 09/06/2018  . Hyperlipidemia 09/06/2018  . Essential hypertension 09/06/2018  . 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[place]) 04/16/2017   Past Medical History:  Past Medical History:  Diagnosis Date  . Hypertension    Past Surgical History:  Past Surgical History:  Procedure Laterality Date  . HIP FRACTURE SURGERY    . LEG AMPUTATION BELOW KNEE     HPI:  Pt is a 68 y.o. male with medical history significant of HTN, HLD, left BKA, and history of trauma to the right; who presented with complaints of dizziness. 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   Assessment / Plan / Recommendation Clinical Impression  Pt reported that he was living independently prior to admission with his girlfriend. He has a third-grade edcuation and stated that he was employed full-time at a company which [employer]. He initially denied any baseline or new deficits in speech, language or cognition but at the end of the assessment stated that his thinking and memory are "not good". [facility] Cognitive Assessment 8.1 was completed to evaluate the pt's cognitive-linguistic skills. He achieved a score of 12/30 which is below the normal limits of 26 or more out of 30 and is suggestive of a moderate impairment. He demonstrated deficits in the areas of executive function, problem solving, attention, mental manipulation, divergent naming, abstract reasoning, and memory. Skilled SLP services are  clinically indicated at this time to improve cognition. Pt, and nursing were educated regarding results and recommendations; both parties verbalized understanding as well as agreement with plan of care.    SLP Assessment  SLP Recommendation/Assessment: Patient needs continued Speech Lanaguage Pathology Services SLP Visit Diagnosis: Cognitive communication deficit (R41.841)    Follow Up Recommendations  Inpatient Rehab    Frequency and Duration min 2x/week  2 weeks      SLP Evaluation Cognition  Overall Cognitive Status: Impaired/Different from baseline Arousal/Alertness: Awake/alert Orientation Level: Oriented to person;Oriented to place;Oriented to situation;Disoriented to time(Oriented to date, year, day but not month) Attention: Focused;Sustained Focused Attention: Impaired Focused Attention Impairment: Verbal complex(Vifilance impaired: 0/1) Sustained Attention: Impaired Sustained Attention Impairment: Verbal complex(Serial 7s: 0/3) Memory: Impaired Memory Impairment: Retrieval deficit;Decreased recall of new information(Immediate: 4/5; Delayed: 0/5; with cues: 4/5) Awareness: Impaired Awareness Impairment: Emergent impairment Problem Solving: Impaired Problem Solving Impairment: Verbal complex(2/5) Executive Function: Reasoning;Sequencing;Organizing Reasoning: Impaired Reasoning Impairment: Verbal complex(Abstraction: 1/2) Sequencing: Impaired Sequencing Impairment: Verbal complex(Clock drawing: 2/3) Organizing: Impaired Organizing Impairment: Verbal complex(Backward digit span: 0/1)       Comprehension  Auditory Comprehension Overall Auditory Comprehension: Appears within functional limits for tasks assessed Yes/No Questions: Within Functional Limits Commands: Impaired Complex Commands: (Trail completion: 0/1) Conversation: Complex Visual Recognition/Discrimination Discrimination: Within Function Limits Reading Comprehension Reading Status: Within funtional  limits    Expression Expression Primary Mode of Expression: Verbal Verbal Expression Overall Verbal Expression: Appears within functional limits for tasks assessed Initiation: No impairment Level of Generative/Spontaneous Verbalization: Conversation Repetition: Impaired Level of Impairment: Sentence level(1/2) Naming: Impairment Confrontation: Impaired(2/3) Convergent: Not tested Divergent: (0/1) Written Expression Dominant Hand: Right Written Expression: (  Copying cube: 0/1)   Oral / Motor  Oral Motor/Sensory Function Overall Oral Motor/Sensory Function: Within functional limits Motor Speech Overall Motor Speech: Appears within functional limits for tasks assessed Respiration: Within functional limits Phonation: Normal Resonance: Within functional limits Articulation: Within functional limitis Intelligibility: Intelligible Motor Planning: Witnin functional limits Motor Speech Errors: Not applicable   Joeanthony Seeling I. Vear ClockPhillips, MS, CCC-SLP Acute Rehabilitation Services Office number [phone] Pager [phone]                    Scheryl MartenShanika I Mozes Sagar 09/07/2018, 11:41 AM

## 2018-09-07 NOTE — Progress Notes (Signed)
Inpatient Rehabilitation Admissions Coordinator Inpatient rehab consult received.  Both PT and OT are recommending Home health at this time. Pt is not in need of an inpt rehab admission at this level of functioning. Please call me with any questions. We will sign off.  Danne Baxter, RN, MSN Rehab Admissions Coordinator [phone] 09/07/2018 5:15 PM

## 2018-09-07 NOTE — Progress Notes (Signed)
STROKE TEAM PROGRESS NOTE   INTERVAL HISTORY I have personally reviewed history of presenting illness with the patient. And reviewed images in PACS  Vitals:   09/07/18 0022 09/07/18 0402 09/07/18 0735 09/07/18 1139  BP: (!) 134/97 138/88 139/87 (!) 156/96  Pulse: 98 93 85 66  Resp: 18 18 16 20   Temp: 98.9 F (37.2 C) 98.8 F (37.1 C) 98.7 F (37.1 C) 98.5 F (36.9 C)  TempSrc: Oral Oral Oral Oral  SpO2: 100% 98% 99% 100%  Weight:      Height:        CBC:  Recent Labs  Lab 09/06/18 0950 09/06/18 1644  WBC 7.3 9.0  NEUTROABS  --  6.5  HGB 14.5 14.3  HCT 44.5 43.2  MCV 82.7 82.6  PLT 246 502    Basic Metabolic Panel:  Recent Labs  Lab 09/06/18 0950  NA 138  K 4.1  CL 100  CO2 26  GLUCOSE 140*  BUN 12  CREATININE 0.79  CALCIUM 9.9   Lipid Panel:     Component Value Date/Time   CHOL 188 09/07/2018 0410   TRIG 82 09/07/2018 0410   HDL 52 09/07/2018 0410   CHOLHDL 3.6 09/07/2018 0410   VLDL 16 09/07/2018 0410   LDLCALC 120 (H) 09/07/2018 0410   HgbA1c:  Lab Results  Component Value Date   HGBA1C 6.2 (H) 09/07/2018   Urine Drug Screen:     Component Value Date/Time   LABOPIA NONE DETECTED 09/06/2018 1650   COCAINSCRNUR NONE DETECTED 09/06/2018 1650   LABBENZ NONE DETECTED 09/06/2018 1650   AMPHETMU NONE DETECTED 09/06/2018 1650   THCU POSITIVE (A) 09/06/2018 1650   LABBARB NONE DETECTED 09/06/2018 1650    Alcohol Level     Component Value Date/Time   ETH <10 09/06/2018 1230    IMAGING Ct Angio Head W Or Wo Contrast  Result Date: 09/06/2018 CLINICAL DATA:  Acute right cerebellar infarction.  Dizziness. EXAM: CT ANGIOGRAPHY HEAD AND NECK TECHNIQUE: Multidetector CT imaging of the head and neck was performed using the standard protocol during bolus administration of intravenous contrast. Multiplanar CT image reconstructions and MIPs were obtained to evaluate the vascular anatomy. Carotid stenosis measurements (when applicable) are obtained  utilizing NASCET criteria, using the distal internal carotid diameter as the denominator. CONTRAST:  102mL OMNIPAQUE IOHEXOL 350 MG/ML SOLN COMPARISON:  MRI same day FINDINGS: CT HEAD FINDINGS Brain: Low-density evident within the right cerebellum consistent with acute/subacute infarction. No hemorrhage. No fourth ventricular compromise. Cerebral hemispheres show mild small vessel change of the deep white matter without focal finding in that region. No hydrocephalus or extra-axial collection. Vascular: Negative Skull: Negative Sinuses: Negative Orbits: Negative Review of the MIP images confirms the above findings CTA NECK FINDINGS Aortic arch: Aortic atherosclerosis. No sign of aneurysm or dissection. Branching pattern is normal without brachiocephalic vessel origin flow limiting stenosis. Right carotid system: Common carotid artery is widely patent to the bifurcation. There is extensive calcified plaque at the carotid bifurcation and ICA bulb. Minimal diameter of the ICA in the bulb region is as narrow as 1.5 mm. Compared to a more distal cervical ICA diameter of 5 mm, this indicates a 70% stenosis. Left carotid system: Common carotid artery widely patent to the bifurcation. Soft and calcified plaque at the carotid bifurcation and ICA bulb. Diameter of the ICA bulb as narrow as 1.5 mm. Compared to a more distal cervical ICA diameter of 5 mm, this indicates a 70% stenosis. Vertebral arteries: The left vertebral  artery origin is widely patent. This non dominant vessel is widely patent through the cervical region to the foramen magnum. There is atherosclerotic disease at the dominant right vertebral artery origin with stenosis estimated at 30%. There is scattered plaque within the proximal portion of the vessel. The vessel appears occluded in the lower cervical region. No antegrade flow visible. There is some reconstituted flow at the foramen magnum level. Skeleton: Ordinary degenerative cervical spondylosis and facet  arthropathy. Other neck: Negative Upper chest: Emphysema and pulmonary scarring.  No active process. Review of the MIP images confirms the above findings CTA HEAD FINDINGS Anterior circulation: Both internal carotid arteries are patent through the skull base and siphon regions. There is siphon calcification but no stenosis greater than 30%. The anterior and middle cerebral vessels are patent without proximal stenosis, aneurysm or vascular malformation. Posterior circulation: The left vertebral artery terminates in PICA. No visible supply to the basilar. The reconstituted right vertebral artery does show patency at the foramen magnum level to the basilar. I do not identify a patent right PICA. There is a patent anterior inferior cerebellar artery on the right. There is no basilar stenosis. There are patent bilateral superior cerebellar arteries and posterior cerebral arteries. Venous sinuses: Patent and normal. Anatomic variants: None other significant. Delayed phase: Not performed. 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 Some antegrade flow in the distal right vertebral artery which appears to be the only supply to the basilar. No visible right PICA. Right anterior inferior cerebellar artery, both superior cerebellar arteries and both posterior cerebral arteries do show flow. The small left vertebral artery terminates in left PICA. Atherosclerotic disease at both carotid bifurcations and internal carotid artery bulbs. 70% stenoses present in both ICA bulbs. Electronically Signed   By: Paulina FusiMark  Shogry M.D.   On: 09/06/2018 15:15   Ct Angio Neck W And/or Wo Contrast  Result Date: 09/06/2018 CLINICAL DATA:  Acute right cerebellar infarction.  Dizziness. EXAM: CT ANGIOGRAPHY HEAD AND NECK TECHNIQUE: Multidetector CT imaging of the head and neck was performed  using the standard protocol during bolus administration of intravenous contrast. Multiplanar CT image reconstructions and MIPs were obtained to evaluate the vascular anatomy. Carotid stenosis measurements (when applicable) are obtained utilizing NASCET criteria, using the distal internal carotid diameter as the denominator. CONTRAST:  75mL OMNIPAQUE IOHEXOL 350 MG/ML SOLN COMPARISON:  MRI same day FINDINGS: CT HEAD FINDINGS Brain: Low-density evident within the right cerebellum consistent with acute/subacute infarction. No hemorrhage. No fourth ventricular compromise. Cerebral hemispheres show mild small vessel change of the deep white matter without focal finding in that region. No hydrocephalus or extra-axial collection. Vascular: Negative Skull: Negative Sinuses: Negative Orbits: Negative Review of the MIP images confirms the above findings CTA NECK FINDINGS Aortic arch: Aortic atherosclerosis. No sign of aneurysm or dissection. Branching pattern is normal without brachiocephalic vessel origin flow limiting stenosis. Right carotid system: Common carotid artery is widely patent to the bifurcation. There is extensive calcified plaque at the carotid bifurcation and ICA bulb. Minimal diameter of the ICA in the bulb region is as narrow as 1.5 mm. Compared to a more distal cervical ICA diameter of 5 mm, this indicates a 70% stenosis. Left carotid system: Common carotid artery widely patent to the bifurcation. Soft and calcified plaque at the carotid bifurcation and ICA bulb. Diameter of the ICA bulb as narrow  as 1.5 mm. Compared to a more distal cervical ICA diameter of 5 mm, this indicates a 70% stenosis. Vertebral arteries: The left vertebral artery origin is widely patent. This non dominant vessel is widely patent through the cervical region to the foramen magnum. There is atherosclerotic disease at the dominant right vertebral artery origin with stenosis estimated at 30%. There is scattered plaque within the  proximal portion of the vessel. The vessel appears occluded in the lower cervical region. No antegrade flow visible. There is some reconstituted flow at the foramen magnum level. Skeleton: Ordinary degenerative cervical spondylosis and facet arthropathy. Other neck: Negative Upper chest: Emphysema and pulmonary scarring.  No active process. Review of the MIP images confirms the above findings CTA HEAD FINDINGS Anterior circulation: Both internal carotid arteries are patent through the skull base and siphon regions. There is siphon calcification but no stenosis greater than 30%. The anterior and middle cerebral vessels are patent without proximal stenosis, aneurysm or vascular malformation. Posterior circulation: The left vertebral artery terminates in PICA. No visible supply to the basilar. The reconstituted right vertebral artery does show patency at the foramen magnum level to the basilar. I do not identify a patent right PICA. There is a patent anterior inferior cerebellar artery on the right. There is no basilar stenosis. There are patent bilateral superior cerebellar arteries and posterior cerebral arteries. Venous sinuses: Patent and normal. Anatomic variants: None other significant. Delayed phase: Not performed. 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 Some antegrade flow in the distal right vertebral artery which appears to be the only supply to the basilar. No visible right PICA. Right anterior inferior cerebellar artery, both superior cerebellar arteries and both posterior cerebral arteries do show flow. The small left vertebral artery terminates in left PICA. Atherosclerotic disease at both carotid bifurcations and internal carotid artery bulbs. 70% stenoses present in both ICA bulbs. Electronically Signed   By: Paulina FusiMark  Shogry M.D.   On:  09/06/2018 15:15   Mr Brain Wo Contrast  Result Date: 09/06/2018 CLINICAL DATA:  Acute presentation with dizziness. EXAM: MRI HEAD WITHOUT CONTRAST TECHNIQUE: Multiplanar, multiecho pulse sequences of the brain and surrounding structures were obtained without intravenous contrast. COMPARISON:  None. FINDINGS: Brain: There is acute infarction within the right cerebellum consistent with most of PICA distribution. Infarction does extend to the medial aspect of the superior cerebellum. This could indicate variations in vascular developmental supply, or possibly also some involvement of the superior cerebellar territory. The area approaches 5 cm in size. There is mild swelling but no hemorrhage or mass effect. Cerebral hemispheres show mild chronic small-vessel change of the deep white matter. No cortical or large vessel territory abnormality the supratentorial brain. No mass lesion, hemorrhage, hydrocephalus or extra-axial collection. Vascular: Major vessels at the base of the brain show flow. Skull and upper cervical spine: Negative Sinuses/Orbits: Clear/normal Other: None IMPRESSION: Acute infarction affecting the right cerebellar hemisphere with mild swelling but no hemorrhage or mass effect. No ventricular obstruction. 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 Some extension to the medial superior cerebellum could indicate variant vascular territory distribution or involvement of the second vessel, the right superior cerebellar artery. Electronically Signed   By: Paulina FusiMark  Shogry M.D.   On: 09/06/2018 13:44    PHYSICAL EXAM Pleasant middle aged african [place] male not in distress. He has  left BKA. . Afebrile. Head is nontraumatic. Neck is supple without bruit.    Cardiac exam no murmur or gallop. Lungs are clear to auscultation. Distal pulses are well felt except in LLE. Neurological Exam ;  Awake  Alert oriented x 3. Normal speech and language.eye movements full  without nystagmus saccadic dysmetria on left gaze..fundi were not visualized. Vision acuity and fields appear normal. Hearing is normal. Palatal movements are normal. Face symmetric. Tongue midline. Normal strength, tone, reflexes and coordination. Normal sensation. Gait deferred.  ASSESSMENT/PLAN Mr. Kristopher AquasDavid Fretwell is a 68 y.o. male with history of HTN and tobacco abuse presenting with dizzy w/ room spinning, .   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  CTA head & neck dominant R VA occluded at lower cervical region with reconstitution in upper cervical region.  Some antegrade flow distal R VA appears to be only supply to the basilar.  R PICA not seen.  Small L VA terminates in L PICA.  B ICA bifurcations and bulbs with atherosclerosis, 70% stenosis in bulbs.  2D Echo EF 60-65%. No source of embolus   LDL 120  HgbA1c 6.2  Lovenox 40 mg sq daily for VTE prophylaxis  aspirin 81 mg daily prior to admission, now on clopidogrel 75 mg daily.  Given intracranial atherosclerosis, DAPT recommended x 3 months then PLAVIX alone.  Orders adjusted.  Therapy recommendations:  pending   Disposition:  pending   Essential Hypertension  Stable . Permissive hypertension (OK if < 220/120) but gradually normalize in 5-7 days . Long-term BP goal normotensive  Hyperlipidemia  Home meds: Lipitor 20  Now on Lipitor 80  LDL 120, goal < 70  Continue statin at discharge  Other Stroke Risk Factors  Advanced age  UDS positive for THC, advised to stop using marrijuana  Cigarette smoker, advised to stop smoking. Using nicotine patch.  Overweight, Body mass index is 29.06 kg/m., recommend weight loss, diet and exercise as appropriate   Other Active Problems  L BKA d/t traumatic amputation  GERD  Hospital day # 1  I have personally  obtained history,examined this patient, reviewed notes, independently viewed imaging studies, participated in medical decision making and plan of care.ROS completed by me personally and pertinent positives fully documented  I have made any additions or clarifications directly to the above note. He presented with right cerebellar infarcts due to right vertebral artery occlusion and remains at risk for recurrent strokes and needs dual antiplatelet therapy of aspirin and Plavix  x 3 weeks and then Plavix alone Aggressive risk factor modification. Check carotid dopplers.D/w Dr Jerral RalphGhimire. I have spent a total of  35 minutes with the patient reviewing hospital notes,  test results, labs and examining the patient as well as establishing an assessment and plan that was discussed personally with the patient.  > 50% of time was spent in direct patient care.      Delia HeadyPramod Asherah Lavoy, MD Medical Director [facility] Pager: [phone] 09/07/2018 5:13 PM   To contact Stroke Continuity provider, please refer to [email or web address]. After hours, contact General Neurology

## 2018-09-07 NOTE — Progress Notes (Signed)
SLP Cancellation Note  Patient Details Name: Kristopher Ruiz MRN: [id number] DOB: 21-Mar-1951   Cancelled treatment:       Reason Eval/Treat Not Completed: Patient at procedure or test/unavailable(Pt with nurse tech at this time. SLP will follow up. )  Doryan Bahl I. Hardin Negus, [place], [place] Office number [phone] Pager [place] 09/07/2018, 9:15 AM

## 2018-09-07 NOTE — Progress Notes (Signed)
  Echocardiogram 2D Echocardiogram has been performed.  Jennette Dubin 09/07/2018, 10:23 AM

## 2018-09-07 NOTE — Progress Notes (Signed)
Carotid duplex completed. Preliminary results in Chart review CV Proc. [facility], [place]  09/07/2018, 4:46 PM

## 2018-09-07 NOTE — Progress Notes (Signed)
  Speech Language Pathology Treatment: Cognitive-Linquistic  Patient Details Name: Kristopher Ruiz MRN: [id number] DOB: [birth year] Today's Date: 09/07/2018 Time: 6378-5885 SLP Time Calculation (min) (ACUTE ONLY): 24 min  Assessment / Plan / Recommendation Clinical Impression  Pt was seen for cogntive-linguistic treatment session. He was alert and cooperative throughout the session without complaint of pain. He demonstrated 60% accuracy with time management problems increasing to 100% accuracy with mod. verbal cues. He completed mental manipulation tasks with 40% accuracy increasing to 100% with min.-mod. cues. He was able to recall 3 items with 100% accuracy but demonstrated 60% accuracy 4-item immediate recall increasing to 100% with min. cues for the 4th item. He completed an abstract reasoning task with 60% accuracy increasing to 100% with mod cues. Pt expressed gratitude for the session and requested that SLP return this evening for an additional session. SLP will continue to follow pt and follow up for an additional session this afternoon if time allows.     HPI HPI: Pt is a 68 y.o. male with medical history significant of HTN, HLD, left BKA, and history of trauma to the right; who presented with complaints of dizziness. 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      SLP Plan  Continue with current plan of care  Patient needs continued Speech Lanaguage Pathology Services    Recommendations                   Follow up Recommendations: Inpatient Rehab SLP Visit Diagnosis: Cognitive communication deficit (O27.741) Plan: Continue with current plan of care       Kristopher Ruiz I. Hardin Negus, [place], [place] Office number [phone] Pager [place] 09/07/2018, 11:50 AM

## 2018-09-07 NOTE — Evaluation (Signed)
Physical Therapy Evaluation Patient Details Name: Tai Skelly MRN: [id number] DOB: Feb 11, 1951 Today's Date: 09/07/2018   History of Present Illness  Pt is a 68 y.o. male with medical history significant of HTN, HLD, left BKA, and history of trauma to the right; who presented with complaints of dizziness. 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  Clinical Impression  Patient is likely safe enough to go home but he is not at his baseline mobility. Prior to admission he was working and walking without a device. He required min a without a device. He feels like his balance is off because of his shoe. He continues to have some difficulty with tracking of his right eyes. He would benefit from further skilled therapy in the hospital and with home care to return to a high level of baseline mobility.     Follow Up Recommendations Home health PT    Equipment Recommendations  Rolling walker with 5" wheels    Recommendations for Other Services       Precautions / Restrictions Precautions Precautions: Fall Restrictions Weight Bearing Restrictions: No      Mobility  Bed Mobility Overal bed mobility: Modified Independent             General bed mobility comments: Per OT patient got out of bed without assistance   Transfers Overall transfer level: Needs assistance Equipment used: Rolling walker (2 wheeled) Transfers: Sit to/from Stand Sit to Stand: Supervision         General transfer comment: reported feeling somewhat off balance because he has a shoe on his prosthetic and not on the otherside  Ambulation/Gait Ambulation/Gait assistance: Min guard Gait Distance (Feet): 150 Feet Assistive device: Rolling walker (2 wheeled)   Gait velocity: decreased    General Gait Details: Patient ambualted most of the way with OT but PT observed  the end of the walk. Decreased balance 2nd to shoe on one side and nothing on the other. Walked to the bed without fdevice but required min a without a device.   Stairs            Wheelchair Mobility    Modified Rankin (Stroke Patients Only) Modified Rankin (Stroke Patients Only) Pre-Morbid Rankin Score: No symptoms Modified Rankin: Moderately severe disability     Balance Overall balance assessment: Needs assistance Sitting-balance support: No upper extremity supported;Feet supported Sitting balance-Leahy Scale: Good     Standing balance support: No upper extremity supported;During functional activity Standing balance-Leahy Scale: Fair Standing balance comment: Fair-poor without UE support. With RW, fair balance                              Pertinent Vitals/Pain Pain Assessment: No/denies pain Pain Score: 7  Pain Location: Head Pain Descriptors / Indicators: Aching Pain Intervention(s): RN gave pain meds during session    [place] expects to be discharged to:: Private residence Living Arrangements: Spouse/significant other Available Help at Discharge: Friend(s);Available 24 hours/day Type of Home: House       Home Layout: One level Home Equipment: Shower seat      Prior Function Level of Independence: Independent         Comments: Independent with donning prosthesis, used shower chair     Hand Dominance   Dominant Hand: Right    Extremity/Trunk Assessment   Upper Extremity Assessment Upper  Extremity Assessment: Defer to OT evaluation    Lower Extremity Assessment Lower Extremity Assessment: Overall WFL for tasks assessed(strong redidual limb )    Cervical / Trunk Assessment Cervical / Trunk Assessment: Normal  Communication   Communication: No difficulties  Cognition Arousal/Alertness: Awake/alert Behavior During Therapy: WFL for tasks assessed/performed Overall Cognitive Status: No family/caregiver present to  determine baseline cognitive functioning Area of Impairment: Problem solving                             Problem Solving: Slow processing        General Comments General comments (skin integrity, edema, etc.): able to independently don/foff device     Exercises     Assessment/Plan    PT Assessment Patient needs continued PT services  PT Problem List Decreased strength;Decreased activity tolerance;Decreased balance;Decreased mobility;Decreased knowledge of use of DME       PT Treatment Interventions DME instruction;Gait training;Functional mobility training;Therapeutic activities;Therapeutic exercise;Neuromuscular re-education;Patient/family education    PT Goals (Current goals can be found in the Care Plan section)  Acute Rehab PT Goals Patient Stated Goal: get home  PT Goal Formulation: With patient    Frequency Min 3X/week   Barriers to discharge        Co-evaluation               AM-PAC PT "6 Clicks" Mobility  Outcome Measure Help needed turning from your back to your side while in a flat bed without using bedrails?: None Help needed moving from lying on your back to sitting on the side of a flat bed without using bedrails?: None Help needed moving to and from a bed to a chair (including a wheelchair)?: A Little Help needed standing up from a chair using your arms (e.g., wheelchair or bedside chair)?: A Little Help needed to walk in hospital room?: A Little Help needed climbing 3-5 steps with a railing? : A Lot 6 Click Score: 19    End of Session Equipment Utilized During Treatment: Gait belt Activity Tolerance: Patient tolerated treatment well Patient left: in bed Nurse Communication: Mobility status PT Visit Diagnosis: Other abnormalities of gait and mobility (R26.89)    Time: [id number] PT Time Calculation (min) (ACUTE ONLY): 16 min   Charges:   PT Evaluation $PT Eval Moderate Complexity: 1 Mod            Dessie Comaavid J Tagen Milby PT DPT   09/07/2018, 2:45 PM

## 2018-09-08 LAB — GLUCOSE, CAPILLARY: Glucose-Capillary: 92 mg/dL (ref 70–99)

## 2018-09-08 MED ORDER — CLOPIDOGREL BISULFATE 75 MG PO TABS: 75.0000 mg | ORAL_TABLET | Freq: Every day | ORAL | 11 refills | Status: AC

## 2018-09-08 MED ORDER — ATORVASTATIN CALCIUM 80 MG PO TABS: 80.0000 mg | ORAL_TABLET | Freq: Every day | ORAL | 11 refills | Status: DC

## 2018-09-08 MED ORDER — NICOTINE 21 MG/24HR TD PT24: 21.0000 mg | MEDICATED_PATCH | Freq: Every day | TRANSDERMAL | 0 refills | Status: AC

## 2018-09-08 NOTE — TOC Initial Note (Signed)
Transition of Care [facility]) - Initial/Assessment Note    Patient Details  Name: Kristopher Ruiz MRN: [id number] Date of Birth: [birth year]  Transition of Care [facility] CM/SW Contact:    Pollie Friar, RN Phone Number: 09/08/2018, 3:02 PM  Clinical Narrative:                   Expected Discharge Plan: Home w Home Health Services Barriers to Discharge: No Barriers Identified   Patient Goals and CMS Choice   CMS Medicare.gov Compare Post Acute Care list provided to:: Patient Choice offered to / list presented to : Patient  Expected Discharge Plan and Services Expected Discharge Plan: [place]   Discharge Planning Services: CM Consult Post Acute Care Choice: Home Health, Durable Medical Equipment Living arrangements for the past 2 months: Apartment(ground level) Expected Discharge Date: 09/08/18               DME Arranged: Gilford Rile rolling DME Agency: [employer] Date DME Agency Contacted: 09/08/18 Time DME Agency Contacted: 1 Representative spoke with at DME Agency: [place] Arranged: PT, OT [place] Agency: [facility] Date [place]: 09/08/18 Time [place]: 1500 Representative spoke with at [place]  Prior Living Arrangements/Services Living arrangements for the past 2 months: Apartment(ground level) Lives with:: Spouse Patient language and need for interpreter reviewed:: Yes(no needs) Do you feel safe going back to the place where you live?: Yes      Need for Family Participation in Patient Care: No (Comment) Care giver support system in place?: Yes (comment)      Activities of Daily Living Home Assistive Devices/Equipment: Prosthesis(leg) ADL Screening (condition at time of admission) Patient's cognitive ability adequate to safely complete daily activities?: Yes Is the patient deaf or have difficulty hearing?: No Does the patient have difficulty seeing, even when wearing glasses/contacts?: No Does the patient have  difficulty concentrating, remembering, or making decisions?: No Patient able to express need for assistance with ADLs?: Yes Does the patient have difficulty dressing or bathing?: No Independently performs ADLs?: Yes (appropriate for developmental age) Does the patient have difficulty walking or climbing stairs?: Yes Weakness of Legs: Left Weakness of Arms/Hands: None  Permission Sought/Granted                  Emotional Assessment Appearance:: Appears stated age Attitude/Demeanor/Rapport: Engaged Affect (typically observed): Accepting Orientation: : Oriented to Self, Oriented to Place, Oriented to  Time, Oriented to Situation   Psych Involvement: No (comment)  Admission diagnosis:  Cerebellar stroke [facility]) [I63.9] Patient Active Problem List   Diagnosis Date Noted  . 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 Hypoglycemia due to insulin 09/06/2018  . Tobacco abuse 09/06/2018  . Hyperlipidemia 09/06/2018  . Essential hypertension 09/06/2018  . 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[place]) 04/16/2017   PCP:  Patient, No Pcp Per Pharmacy:   [id number] - [place], [place] [address] [place] [address] Phone: [phone] Fax: [phone]  [place], [place] - [address] [place] [address] Phone: [phone] Fax: [phone]     Social Determinants of Health ([place]) Interventions    Readmission Risk Interventions No flowsheet data found.

## 2018-09-08 NOTE — Discharge Summary (Signed)
Physician Discharge Summary  Kristopher Ruiz ZOX:[id number] DOB: 05/29/1950 DOA: 09/06/2018  PCP: Patient, No Pcp Per  Admit date: 09/06/2018 Discharge date: 09/08/2018  Admitted From: Home Disposition: Home with home health PT  Recommendations for Outpatient Follow-up:  1. Follow up with PCP in 1-2 weeks 2. Follow-up with urology.  Referral made.  Home Health: PT Equipment/Devices: None  Discharge Condition: Stable CODE STATUS: Full code Diet recommendation: Low carbohydrate diet  Brief/Interim Summary: 68 year old gentleman with history of hypertension, hyperlipidemia, left BKA after trauma who presented to the hospital with dizziness onset more than 24 hours.  No history of previous stroke.  Patient was having dizzy spells whenever he was trying to get up and move around.  In the emergency room, patient was hemodynamically stable.  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  Then he will continue Plavix only.  Neurology referral for outpatient follow-up.  Lipitor increased to 80 mg daily.  2D echocardiogram was normal.  LDL was 120.  Hemoglobin A1c 6.2, discussed lifestyle changes and dietary modifications. Smoking cessation.  Nicotine patch. Blood pressures are on goal. Patient has some dizziness and slowly improving.  He will benefit with therapies at home as recommended by PT and OT.  Patient will be  discharged home with home health PT.  Discharge Instructions  Discharge Instructions    Ambulatory referral to Neurology   Complete by:  As directed    An appointment is requested in approximately: 4 weeks   Call MD for:  persistant dizziness or light-headedness   Complete by:  As directed    Diet - low sodium heart healthy   Complete by:  As directed    Increase activity slowly   Complete by:  As directed      Allergies as of 09/08/2018      Reactions   Ibuprofen Other (See Comments)   GI Problems      Medication List    STOP taking these medications   naproxen 500 MG tablet Commonly known as:  NAPROSYN   trolamine salicylate 10 % cream Commonly known as:  Aspercreme/Aloe     TAKE these medications   acetaminophen 500 MG tablet Commonly known as:  TYLENOL Take 1,000 mg by mouth every 6 (six) hours as needed for mild pain or headache.   aspirin 81 MG tablet Take 81 mg by mouth daily.   atorvastatin 80 MG tablet Commonly known as:  LIPITOR Take 1 tablet (80 mg total) by mouth daily for 30 days. What changed:    medication strength  how much to take   clopidogrel 75 MG tablet Commonly known as:  PLAVIX Take 1 tablet (75 mg total) by mouth daily for 30 days.   diltiazem 120 MG 24 hr capsule Commonly known as:  TIAZAC Take 120 mg by mouth daily.   gabapentin 100 MG capsule Commonly known as:  NEURONTIN Take 1 capsule by mouth at bedtime x 1 week, then TWICE DAILY x 1 week, then THREE TIMES DAILY]  What changed:  See the new instructions.   hydrochlorothiazide 25 MG tablet Commonly known as:  HYDRODIURIL Take 25 mg by mouth daily.   loratadine 10 MG tablet Commonly known as:  CLARITIN Take 10 mg by mouth daily.   metoprolol succinate 50 MG 24 hr tablet Commonly known as:  TOPROL-XL Take 50 mg by mouth daily. Take with or immediately following a meal.   nicotine 21 mg/24hr patch Commonly known as:  NICODERM CQ - dosed in mg/24 hours Place 1 patch  (21 mg total) onto the skin daily for 14 days.   omeprazole 40 MG capsule Commonly known as:  PRILOSEC Take 40 mg by mouth daily.       Allergies  Allergen Reactions  . Ibuprofen Other (See Comments)    GI Problems    Consultations:  Neurology   Procedures/Studies: Ct Angio Head W Or Wo Contrast  Result Date: 09/06/2018 CLINICAL DATA:  Acute right cerebellar infarction.  Dizziness. EXAM: CT ANGIOGRAPHY HEAD AND NECK TECHNIQUE: Multidetector CT imaging of the head and neck was performed using the standard protocol during bolus administration of intravenous contrast. Multiplanar CT image reconstructions and MIPs were obtained to evaluate the vascular anatomy. Carotid stenosis measurements (when applicable) are obtained utilizing NASCET criteria, using the distal internal carotid diameter as the denominator. CONTRAST:  75mL OMNIPAQUE IOHEXOL 350 MG/ML SOLN COMPARISON:  MRI same day FINDINGS: CT HEAD FINDINGS Brain: Low-density evident within the right cerebellum consistent with acute/subacute infarction. No hemorrhage. No fourth ventricular compromise. Cerebral hemispheres show mild small vessel change of the deep white matter without focal finding in that region. No hydrocephalus or extra-axial collection. Vascular: Negative Skull: Negative Sinuses: Negative Orbits: Negative Review of the MIP images confirms the above findings CTA NECK FINDINGS Aortic arch: Aortic atherosclerosis. No sign of aneurysm or dissection. Branching pattern is normal without brachiocephalic vessel origin flow limiting stenosis. Right carotid system: Common carotid artery is widely patent to the bifurcation. There is extensive calcified plaque at the carotid bifurcation and ICA bulb. Minimal diameter of the ICA in the bulb region is as narrow as 1.5 mm. Compared to a more distal cervical ICA diameter of 5 mm, this indicates a 70% stenosis. Left carotid system: Common carotid artery widely patent to the bifurcation. Soft  and calcified plaque at the carotid bifurcation and ICA bulb. Diameter of the ICA bulb as narrow as 1.5 mm. Compared to a more distal cervical ICA diameter of 5 mm, this indicates a 70% stenosis. Vertebral arteries: The left vertebral artery origin is widely patent. This non dominant vessel is widely patent through the cervical region to the foramen magnum. There is atherosclerotic disease at the dominant right vertebral artery origin with stenosis estimated at 30%. There is scattered plaque within the proximal portion of the vessel. The vessel appears occluded in the lower cervical region. No antegrade flow visible. There is some reconstituted flow at the foramen magnum level. Skeleton: Ordinary degenerative cervical spondylosis and facet arthropathy. Other neck: Negative Upper chest: Emphysema and pulmonary scarring.  No active process. Review of the MIP images confirms the above findings CTA HEAD FINDINGS Anterior circulation: Both internal carotid arteries are patent through the skull base and siphon regions. There is siphon calcification but no stenosis greater than 30%. The anterior and middle cerebral vessels are patent without proximal stenosis, aneurysm or vascular malformation. Posterior circulation: The left vertebral artery terminates in PICA. No visible supply to the basilar. The reconstituted right vertebral artery does show patency at the  foramen magnum level to the basilar. I do not identify a patent right PICA. There is a patent anterior inferior cerebellar artery on the right. There is no basilar stenosis. There are patent bilateral superior cerebellar arteries and posterior cerebral arteries. Venous sinuses: Patent and normal. Anatomic variants: None other significant. Delayed phase: Not performed. Review of the MIP images confirms the above findings IMPRESSION: The dominant right vertebral artery is occluded in the lower cervical region and reconstitutes in the upper cervical region by  collaterals. Some antegrade flow in the distal right vertebral artery which appears to be the only supply to the basilar. No visible right PICA. Right anterior inferior cerebellar artery, both superior cerebellar arteries and both posterior cerebral arteries do show flow. The small left vertebral artery terminates in left PICA. Atherosclerotic disease at both carotid bifurcations and internal carotid artery bulbs. 70% stenoses present in both ICA bulbs. Electronically Signed   By: Paulina Fusi M.D.   On: 09/06/2018 15:15   Ct Angio Neck W And/or Wo Contrast  Result Date: 09/06/2018 CLINICAL DATA:  Acute right cerebellar infarction.  Dizziness. EXAM: CT ANGIOGRAPHY HEAD AND NECK TECHNIQUE: Multidetector CT imaging of the head and neck was performed using the standard protocol during bolus administration of intravenous contrast. Multiplanar CT image reconstructions and MIPs were obtained to evaluate the vascular anatomy. Carotid stenosis measurements (when applicable) are obtained utilizing NASCET criteria, using the distal internal carotid diameter as the denominator. CONTRAST:  75mL OMNIPAQUE IOHEXOL 350 MG/ML SOLN COMPARISON:  MRI same day FINDINGS: CT HEAD FINDINGS Brain: Low-density evident within the right cerebellum consistent with acute/subacute infarction. No hemorrhage. No fourth ventricular compromise. Cerebral hemispheres show mild small vessel change of the deep white matter without focal finding in that region. No hydrocephalus or extra-axial collection. Vascular: Negative Skull: Negative Sinuses: Negative Orbits: Negative Review of the MIP images confirms the above findings CTA NECK FINDINGS Aortic arch: Aortic atherosclerosis. No sign of aneurysm or dissection. Branching pattern is normal without brachiocephalic vessel origin flow limiting stenosis. Right carotid system: Common carotid artery is widely patent to the bifurcation. There is extensive calcified plaque at the carotid bifurcation and ICA  bulb. Minimal diameter of the ICA in the bulb region is as narrow as 1.5 mm. Compared to a more distal cervical ICA diameter of 5 mm, this indicates a 70% stenosis. Left carotid system: Common carotid artery widely patent to the bifurcation. Soft and calcified plaque at the carotid bifurcation and ICA bulb. Diameter of the ICA bulb as narrow as 1.5 mm. Compared to a more distal cervical ICA diameter of 5 mm, this indicates a 70% stenosis. Vertebral arteries: The left vertebral artery origin is widely patent. This non dominant vessel is widely patent through the cervical region to the foramen magnum. There is atherosclerotic disease at the dominant right vertebral artery origin with stenosis estimated at 30%. There is scattered plaque within the proximal portion of the vessel. The vessel appears occluded in the lower cervical region. No antegrade flow visible. There is some reconstituted flow at the foramen magnum level. Skeleton: Ordinary degenerative cervical spondylosis and facet arthropathy. Other neck: Negative Upper chest: Emphysema and pulmonary scarring.  No active process. Review of the MIP images confirms the above findings CTA HEAD FINDINGS Anterior circulation: Both internal carotid arteries are patent through the skull base and siphon regions. There is siphon calcification but no stenosis greater than 30%. The anterior and middle cerebral vessels are patent without proximal stenosis, aneurysm or vascular malformation. Posterior  circulation: The left vertebral artery terminates in PICA. No visible supply to the basilar. The reconstituted right vertebral artery does show patency at the foramen magnum level to the basilar. I do not identify a patent right PICA. There is a patent anterior inferior cerebellar artery on the right. There is no basilar stenosis. There are patent bilateral superior cerebellar arteries and posterior cerebral arteries. Venous sinuses: Patent and normal. Anatomic variants: None  other significant. Delayed phase: Not performed. 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 Some antegrade flow in the distal right vertebral artery which appears to be the only supply to the basilar. No visible right PICA. Right anterior inferior cerebellar artery, both superior cerebellar arteries and both posterior cerebral arteries do show flow. The small left vertebral artery terminates in left PICA. Atherosclerotic disease at both carotid bifurcations and internal carotid artery bulbs. 70% stenoses present in both ICA bulbs. Electronically Signed   By: Paulina Fusi M.D.   On: 09/06/2018 15:15   Mr Brain Wo Contrast  Result Date: 09/06/2018 CLINICAL DATA:  Acute presentation with dizziness. EXAM: MRI HEAD WITHOUT CONTRAST TECHNIQUE: Multiplanar, multiecho pulse sequences of the brain and surrounding structures were obtained without intravenous contrast. COMPARISON:  None. FINDINGS: Brain: There is acute infarction within the right cerebellum consistent with most of PICA distribution. Infarction does extend to the medial aspect of the superior cerebellum. This could indicate variations in vascular developmental supply, or possibly also some involvement of the superior cerebellar territory. The area approaches 5 cm in size. There is mild swelling but no hemorrhage or mass effect. Cerebral hemispheres show mild chronic small-vessel change of the deep white matter. No cortical or large vessel territory abnormality the supratentorial brain. No mass lesion, hemorrhage, hydrocephalus or extra-axial collection. Vascular: Major vessels at the base of the brain show flow. Skull and upper cervical spine: Negative Sinuses/Orbits: Clear/normal Other: None IMPRESSION: Acute infarction affecting the right cerebellar hemisphere with mild swelling but no hemorrhage  or mass effect. No ventricular obstruction. 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 Some extension to the medial superior cerebellum could indicate variant vascular territory distribution or involvement of the second vessel, the right superior cerebellar artery. Electronically Signed   By: Paulina Fusi M.D.   On: 09/06/2018 13:44   Vas US Carotid  Result Date: 09/07/2018 Carotid Arterial Duplex Study Indications:       CVA, Visual disturbance and Carotid stenosis by CTA, balance                    issues, dizziness, and headaches. Risk Factors:      Hypertension, hyperlipidemia, current smoker. Other Factors:     Substance abuse. Comparison Study:  CTA 09/07/2018 Bilateral 70% ICA stenosis Performing Technologist: Toma Deiters RVS  Examination Guidelines: A complete evaluation includes B-mode imaging, spectral Doppler, color Doppler, and power Doppler as needed of all accessible portions of each vessel. Bilateral testing is considered an integral part of a complete examination. Limited examinations for reoccurring indications may be performed as noted.  Right Carotid Findings: +----------+--------+--------+--------+--------------------+-------------------+           PSV cm/sEDV cm/sStenosisDescribe            Comments            +----------+--------+--------+--------+--------------------+-------------------+ CCA Prox  118     32  intimal thickening  +----------+--------+--------+--------+--------------------+-------------------+ CCA Distal58      14              heterogenous and    mild plaque on the                                    focal               near wall           +----------+--------+--------+--------+--------------------+-------------------+ ICA Prox  132     53      40-59%  irregular and       moderate plaque                                       heterogenous                             +----------+--------+--------+--------+--------------------+-------------------+ ICA Mid   131     51      40-59%  heterogenous        mild to moderare                                                          palque              +----------+--------+--------+--------+--------------------+-------------------+ ICA Distal85      34                                  tortuous            +----------+--------+--------+--------+--------------------+-------------------+ ECA       65      13              irregular and       moderate plaque                                       heterogenous                            +----------+--------+--------+--------+--------------------+-------------------+ +----------+--------+-------+--------+-------------------+           PSV cm/sEDV cmsDescribeArm Pressure (mmHG) +----------+--------+-------+--------+-------------------+ [id number]                                         +----------+--------+-------+--------+-------------------+ +---------+--------+-+--------+-+---------------------------+ VertebralPSV cm/s9EDV cm/s2Atypical and near occlusion +---------+--------+-+--------+-+---------------------------+  Left Carotid Findings: +----------+--------+--------+--------+--------------------+-------------------+           PSV cm/sEDV cm/sStenosisDescribe            Comments            +----------+--------+--------+--------+--------------------+-------------------+ CCA Prox  103     29  intimal thickening  +----------+--------+--------+--------+--------------------+-------------------+ CCA Distal83      22                                  intimal thickening  +----------+--------+--------+--------+--------------------+-------------------+ ICA Prox  92      26      1-39%   irregular and       mild to moderate                                      heterogenous         plaque              +----------+--------+--------+--------+--------------------+-------------------+ ICA Mid   65      25                                  tortuous            +----------+--------+--------+--------+--------------------+-------------------+ ICA Distal180     63      40-59%                      tortuous            +----------+--------+--------+--------+--------------------+-------------------+ ECA       97      20              heterogenous and    moderate plaque                                       irregular           with acoustic                                                             shadowing           +----------+--------+--------+--------+--------------------+-------------------+ +----------+--------+--------+--------+-------------------+ SubclavianPSV cm/sEDV cm/sDescribeArm Pressure (mmHG) +----------+--------+--------+--------+-------------------+           88                                          +----------+--------+--------+--------+-------------------+ +---------+--------+--+--------+--+---------+ VertebralPSV cm/s91EDV cm/s32Antegrade +---------+--------+--+--------+--+---------+  Summary: Right Carotid: Velocities in the right ICA are consistent with a 40-59%                stenosis. Left Carotid: Velocities in the left ICA are consistent with a 40-59% stenosis. Vertebrals:  Bilateral vertebral arteries demonstrate antegrade flow. Rt Doppler              flow was atypical appears to have a near occlusion. Subclavians: Normal flow hemodynamics were seen in bilateral subclavian              arteries. *See table(s) above for measurements and observations.     Preliminary       Subjective: Patient was seen and examined.  He was eager to go home.  Was a still having intermittent  dizziness on walking, however was able to walk with support and wheeled walker.   Discharge Exam: Vitals:   09/08/18 0839 09/08/18 1110  BP:  (!) 141/85 (!) 146/85  Pulse: 72 65  Resp: 16 16  Temp: 97.9 F (36.6 C) 98.5 F (36.9 C)  SpO2: 100% 99%   Vitals:   09/07/18 2359 09/08/18 0430 09/08/18 0839 09/08/18 1110  BP: 119/84 131/84 (!) 141/85 (!) 146/85  Pulse: 79 74 72 65  Resp: 18 18 16 16   Temp: 98.4 F (36.9 C) 98.6 F (37 C) 97.9 F (36.6 C) 98.5 F (36.9 C)  TempSrc: Oral Oral Oral Oral  SpO2: 98% 100% 100% 99%  Weight:      Height:        General: Pt is alert, awake, not in acute distress Cardiovascular: RRR, S1/S2 +, no rubs, no gallops Respiratory: CTA bilaterally, no wheezing, no rhonchi Abdominal: Soft, NT, ND, bowel sounds + Extremities: no edema, no cyanosis    The results of significant diagnostics from this hospitalization (including imaging, microbiology, ancillary and laboratory) are listed below for reference.     Microbiology: Recent Results (from the past 240 hour(s))  SARS Coronavirus 2 (CEPHEID - [facility]), Hosp Order     Status: None   Collection Time: 09/06/18  3:08 PM  Result Value Ref Range Status   SARS Coronavirus 2 NEGATIVE NEGATIVE Final    Comment: (NOTE) If result is NEGATIVE SARS-CoV-2 target nucleic acids are NOT DETECTED. The SARS-CoV-2 RNA is generally detectable in upper and lower  respiratory specimens during the acute phase of infection. The lowest  concentration of SARS-CoV-2 viral copies this assay can detect is 250  copies / mL. A negative result does not preclude SARS-CoV-2 infection  and should not be used as the sole basis for treatment or other  patient management decisions.  A negative result may occur with  improper specimen collection / handling, submission of specimen other  than nasopharyngeal swab, presence of viral mutation(s) within the  areas targeted by this assay, and inadequate number of viral copies  (<250 copies / mL). A negative result must be combined with clinical  observations, patient history, and  epidemiological information. If result is POSITIVE SARS-CoV-2 target nucleic acids are DETECTED. The SARS-CoV-2 RNA is generally detectable in upper and lower  respiratory specimens dur ing the acute phase of infection.  Positive  results are indicative of active infection with SARS-CoV-2.  Clinical  correlation with patient history and other diagnostic information is  necessary to determine patient infection status.  Positive results do  not rule out bacterial infection or co-infection with other viruses. If result is PRESUMPTIVE POSTIVE SARS-CoV-2 nucleic acids MAY BE PRESENT.   A presumptive positive result was obtained on the submitted specimen  and confirmed on repeat testing.  While 2019 novel coronavirus  (SARS-CoV-2) nucleic acids may be present in the submitted sample  additional confirmatory testing may be necessary for epidemiological  and / or clinical management purposes  to differentiate between  SARS-CoV-2 and other Sarbecovirus currently known to infect humans.  If clinically indicated additional testing with an alternate test  methodology [phone]([id number]) is advised. The SARS-CoV-2 RNA is generally  detectable in upper and lower respiratory sp ecimens during the acute  phase of infection. The expected result is Negative. Fact Sheet for Patients:  BoilerBrush.com.cyhttps://www.fda.gov/media/136312/download Fact Sheet for Healthcare Providers: https://pope.com/https://www.fda.gov/media/136313/download This test is not yet approved or cleared by the [place] FDA and has  been authorized for detection and/or diagnosis of SARS-CoV-2 by FDA under an Emergency Use Authorization (EUA).  This EUA will remain in effect (meaning this test can be used) for the duration of the COVID-19 declaration under Section 564(b)(1) of the Act, 21 U.S.C. section 360bbb-3(b)(1), unless the authorization is terminated or revoked sooner. Performed at [facility], [address], [place], [place] [address]       Labs: BNP (last 3 results) No results for input(s): BNP in the last 8760 hours. Basic Metabolic Panel: Recent Labs  Lab 09/06/18 0950  NA 138  K 4.1  CL 100  CO2 26  GLUCOSE 140*  BUN 12  CREATININE 0.79  CALCIUM 9.9   Liver Function Tests: No results for input(s): AST, ALT, ALKPHOS, BILITOT, PROT, ALBUMIN in the last 168 hours. No results for input(s): LIPASE, AMYLASE in the last 168 hours. No results for input(s): AMMONIA in the last 168 hours. CBC: Recent Labs  Lab 09/06/18 0950 09/06/18 1644  WBC 7.3 9.0  NEUTROABS  --  6.5  HGB 14.5 14.3  HCT 44.5 43.2  MCV 82.7 82.6  PLT 246 232   Cardiac Enzymes: No results for input(s): CKTOTAL, CKMB, CKMBINDEX, TROPONINI in the last 168 hours. BNP: Invalid input(s): POCBNP CBG: Recent Labs  Lab 09/06/18 1228 09/06/18 1507 09/06/18 2010 09/07/18 0025 09/08/18 0000  GLUCAP 51* 92 79 89 92   D-Dimer No results for input(s): DDIMER in the last 72 hours. Hgb A1c Recent Labs    09/07/18 0410  HGBA1C 6.2*   Lipid Profile Recent Labs    09/07/18 0410  CHOL 188  HDL 52  LDLCALC 120*  TRIG 82  CHOLHDL 3.6   Thyroid function studies No results for input(s): TSH, T4TOTAL, T3FREE, THYROIDAB in the last 72 hours.  Invalid input(s): FREET3 Anemia work up No results for input(s): VITAMINB12, FOLATE, FERRITIN, TIBC, IRON, RETICCTPCT in the last 72 hours. Urinalysis    Component Value Date/Time   COLORURINE YELLOW 09/06/2018 1655   APPEARANCEUR CLEAR 09/06/2018 1655   LABSPEC 1.042 (H) 09/06/2018 1655   PHURINE 6.0 09/06/2018 1655   GLUCOSEU NEGATIVE 09/06/2018 1655   HGBUR NEGATIVE 09/06/2018 1655   BILIRUBINUR NEGATIVE 09/06/2018 1655   KETONESUR 20 (A) 09/06/2018 1655   PROTEINUR NEGATIVE 09/06/2018 1655   NITRITE NEGATIVE 09/06/2018 1655   LEUKOCYTESUR NEGATIVE 09/06/2018 1655   Sepsis Labs Invalid input(s): PROCALCITONIN,  WBC,  LACTICIDVEN Microbiology Recent Results (from the past 240 hour(s))   SARS Coronavirus 2 (CEPHEID - Performed in [facility] hospital lab), Hosp Order     Status: None   Collection Time: 09/06/18  3:08 PM  Result Value Ref Range Status   SARS Coronavirus 2 NEGATIVE NEGATIVE Final    Comment: (NOTE) If result is NEGATIVE SARS-CoV-2 target nucleic acids are NOT DETECTED. The SARS-CoV-2 RNA is generally detectable in upper and lower  respiratory specimens during the acute phase of infection. The lowest  concentration of SARS-CoV-2 viral copies this assay can detect is 250  copies / mL. A negative result does not preclude SARS-CoV-2 infection  and should not be used as the sole basis for treatment or other  patient management decisions.  A negative result may occur with  improper specimen collection / handling, submission of specimen other  than nasopharyngeal swab, presence of viral mutation(s) within the  areas targeted by this assay, and inadequate number of viral copies  (<250 copies / mL). A negative result must be combined with clinical  observations, patient history, and epidemiological information. If result is POSITIVE SARS-CoV-2 target nucleic acids are DETECTED. The SARS-CoV-2 RNA is generally detectable in upper and lower  respiratory specimens dur ing the acute phase of infection.  Positive  results are indicative of active infection with SARS-CoV-2.  Clinical  correlation with patient history and other diagnostic information is  necessary to determine patient infection status.  Positive results do  not rule out bacterial infection or co-infection with other viruses. If result is PRESUMPTIVE POSTIVE SARS-CoV-2 nucleic acids MAY BE PRESENT.   A presumptive positive result was obtained on the submitted specimen  and confirmed on repeat testing.  While 2019 novel coronavirus  (SARS-CoV-2) nucleic acids may be present in the submitted sample  additional confirmatory testing may be necessary for epidemiological  and / or clinical management  purposes  to differentiate between  SARS-CoV-2 and other Sarbecovirus currently known to infect humans.  If clinically indicated additional testing with an alternate test  methodology [phone]([id number]) is advised. The SARS-CoV-2 RNA is generally  detectable in upper and lower respiratory sp ecimens during the acute  phase of infection. The expected result is Negative. Fact Sheet for Patients:  BoilerBrush.com.cyhttps://www.fda.gov/media/136312/download Fact Sheet for Healthcare Providers: https://pope.com/https://www.fda.gov/media/136313/download This test is not yet approved or cleared by the [place] FDA and has been authorized for detection and/or diagnosis of SARS-CoV-2 by FDA under an Emergency Use Authorization (EUA).  This EUA will remain in effect (meaning this test can be used) for the duration of the COVID-19 declaration under Section 564(b)(1) of the Act, 21 U.S.C. section 360bbb-3(b)(1), unless the authorization is terminated or revoked sooner. Performed at [facility], [address] [place], [place] [address]      Time coordinating discharge: 25 minutes  SIGNED:   Dorcas CarrowKuber Luretha Eberly, MD  [facility] 09/08/2018, 12:03 PM Pager [phone]  If 7PM-7AM, please contact night-coverage [email or web address] Password [email or web address]

## 2018-09-08 NOTE — Progress Notes (Signed)
Chaplain responded to spiritual consult. Patient may be interested in Advanced Directive. Chaplain explained AD to patient. Patient said "I need this!" and thanked chaplain for coming.  Pt is awaiting discharge papers and will complete forms later. [place] Pager [phone]

## 2018-09-08 NOTE — Discharge Instructions (Signed)
Alteplase Treatment for Ischemic Stroke  Alteplase is a medicine that can dissolve blood clots. An ischemic stroke is caused by clots that block blood flow to the brain. Alteplase can help treat a stroke if it is given very shortly after stroke symptoms begin. Before giving this medicine, your doctor will decide if the medicine may work for you based on:  Your age.  Your condition.  Other factors. Tell your doctor about:  Any allergies you have.  All medicines you are taking.  Any blood disorders you have.  Any medical conditions you have.  Any bleeding in the last month, including stomach or vaginal bleeding.  Any surgeries or procedures you have had.  Whether you are pregnant or may be pregnant.  When your symptoms started. What are the risks? Generally, this is a safe treatment. However, problems may happen, including:  Bleeding.  Allergic reactions to the medicine. What happens before the procedure?  Your doctor will do an exam.  Your doctor will check your: ? Blood pressure. ? Heart rate. ? Breathing.  Medicines may be given to adjust your blood pressure, if needed.  You may have tests, such as: ? Blood tests. ? A head scan (CT scan). What happens during the procedure?  An IV tube will be put into one of your veins.  Alteplase will be given to you through the IV tube.  In some cases, this medicine may be given directly to the affected area through a thin tube (catheter). This is usually put in at the top of your leg.  Your health care team will closely watch your: ? Blood pressure. ? Heart rate. ? Breathing.  Your doctor will check often to see how well the medicine is working.  If the medicine causes bleeding, it will be stopped. Another treatment will be started. What happens after the procedure?  You will be watched closely in the ICU or the stroke unit.  You will be assessed by several specialists.  If you had a catheter, you may  have: ? Bruising. ? Soreness. ? Swelling.  It may take days, weeks, or months to fully see how well your body responded to the treatment. Follow these instructions in the hospital: Medicines  Take over-the-counter and prescription medicines only as told by your doctor. Activity  Do not get out of bed without help. This is for your safety.  Limit activity after your treatment.  Do stroke rehab programs as told by your doctor. General instructions  Tell a nurse or doctor right away if you have any bleeding, bruising or injuries.  Use a soft-bristled toothbrush. Brush your teeth gently. Get help right away if you have:  Blood in your vomit, stool, or urine.  A serious fall or accident, or you hit your head.  Symptoms of an allergic reaction such as rash or difficulty breathing.  Any signs of a stroke. "BE FAST" is an easy way to remember the main warning signs. ? B - Balance. Signs are dizziness, sudden trouble walking, or loss of balance. ? E - Eyes. Signs are trouble seeing or a sudden change in how you see. ? F - Face. Signs are sudden weakness or loss of feeling in the face, or the face or eyelid drooping on one side. ? A - Arms. Signs are weakness or loss of feeling in an arm. This happens suddenly and usually on one side of the body. ? S - Speech. Signs are sudden trouble speaking, slurred speech, or trouble understanding  what people say. ? T - Time. Time to call emergency services. Write down what time symptoms started.  Other signs of a stroke. This may include: ? A sudden, very bad headache with no known cause. ? Feeling sick to your stomach (nausea). ? Throwing up. ? Uncontrolled, jerky movements (seizure). These symptoms may represent a serious problem that is an emergency. Do not wait to see if the symptoms will go away. Get medical help right away.  Summary  Alteplase is a medicine that can break up blood clots. Blood clots can cause a stroke.  This medicine  may help if you get it as soon as possible after your stroke symptoms start.  Get help right away if you are showing signs of increased bleeding or are having symptoms of stroke. This information is not intended to replace advice given to you by your health care provider. Make sure you discuss any questions you have with your health care provider. Document Released: 07/02/2010 Document Revised: 04/15/2017 Document Reviewed: 04/15/2017 Elsevier Interactive Patient Education  2019 [employer]. Mosie Lukes

## 2018-09-08 NOTE — Plan of Care (Signed)
Patient stable, discussed POC with patient including reducing modifying risk factors of smoking, diet, and taking medications as prescribed. Patient agreeable with plan, denies question/concerns at this time.

## 2018-09-08 NOTE — Progress Notes (Signed)
STROKE TEAM PROGRESS NOTE   INTERVAL HISTORY Patient is doing well.  He has been seen by physical therapist who recommended home health.  Patient wants to go home.  Vitals:   09/07/18 2359 09/08/18 0430 09/08/18 0839 09/08/18 1110  BP: 119/84 131/84 (!) 141/85 (!) 146/85  Pulse: 79 74 72 65  Resp: 18 18 16 16   Temp: 98.4 F (36.9 C) 98.6 F (37 C) 97.9 F (36.6 C) 98.5 F (36.9 C)  TempSrc: Oral Oral Oral Oral  SpO2: 98% 100% 100% 99%  Weight:      Height:        CBC:  Recent Labs  Lab 09/06/18 0950 09/06/18 1644  WBC 7.3 9.0  NEUTROABS  --  6.5  HGB 14.5 14.3  HCT 44.5 43.2  MCV 82.7 82.6  PLT 246 232    Basic Metabolic Panel:  Recent Labs  Lab 09/06/18 0950  NA 138  K 4.1  CL 100  CO2 26  GLUCOSE 140*  BUN 12  CREATININE 0.79  CALCIUM 9.9   Lipid Panel:     Component Value Date/Time   CHOL 188 09/07/2018 0410   TRIG 82 09/07/2018 0410   HDL 52 09/07/2018 0410   CHOLHDL 3.6 09/07/2018 0410   VLDL 16 09/07/2018 0410   LDLCALC 120 (H) 09/07/2018 0410   HgbA1c:  Lab Results  Component Value Date   HGBA1C 6.2 (H) 09/07/2018   Urine Drug Screen:     Component Value Date/Time   LABOPIA NONE DETECTED 09/06/2018 1650   COCAINSCRNUR NONE DETECTED 09/06/2018 1650   LABBENZ NONE DETECTED 09/06/2018 1650   AMPHETMU NONE DETECTED 09/06/2018 1650   THCU POSITIVE (A) 09/06/2018 1650   LABBARB NONE DETECTED 09/06/2018 1650    Alcohol Level     Component Value Date/Time   ETH <10 09/06/2018 1230    IMAGING [place] Carotid  Result Date: 09/07/2018 Carotid Arterial Duplex Study Indications:       CVA, Visual disturbance and Carotid stenosis by CTA, balance                    issues, dizziness, and headaches. Risk Factors:      Hypertension, hyperlipidemia, current smoker. Other Factors:     Substance abuse. Comparison Study:  CTA 09/07/2018 Bilateral 70% ICA stenosis Performing Technologist: Toma DeitersVirginia Slaughter RVS  Examination Guidelines: A complete  evaluation includes B-mode imaging, spectral Doppler, color Doppler, and power Doppler as needed of all accessible portions of each vessel. Bilateral testing is considered an integral part of a complete examination. Limited examinations for reoccurring indications may be performed as noted.  Right Carotid Findings: +----------+--------+--------+--------+--------------------+-------------------+           PSV cm/sEDV cm/sStenosisDescribe            Comments            +----------+--------+--------+--------+--------------------+-------------------+ CCA Prox  118     32                                  intimal thickening  +----------+--------+--------+--------+--------------------+-------------------+ CCA Distal58      14              heterogenous and    mild plaque on the  focal               near wall           +----------+--------+--------+--------+--------------------+-------------------+ ICA Prox  132     53      40-59%  irregular and       moderate plaque                                       heterogenous                            +----------+--------+--------+--------+--------------------+-------------------+ ICA Mid   131     51      40-59%  heterogenous        mild to moderare                                                          palque              +----------+--------+--------+--------+--------------------+-------------------+ ICA Distal85      34                                  tortuous            +----------+--------+--------+--------+--------------------+-------------------+ ECA       65      13              irregular and       moderate plaque                                       heterogenous                            +----------+--------+--------+--------+--------------------+-------------------+ +----------+--------+-------+--------+-------------------+           PSV cm/sEDV  cmsDescribeArm Pressure (mmHG) +----------+--------+-------+--------+-------------------+ [id number]                                         +----------+--------+-------+--------+-------------------+ +---------+--------+-+--------+-+---------------------------+ VertebralPSV cm/s9EDV cm/s2Atypical and near occlusion +---------+--------+-+--------+-+---------------------------+  Left Carotid Findings: +----------+--------+--------+--------+--------------------+-------------------+           PSV cm/sEDV cm/sStenosisDescribe            Comments            +----------+--------+--------+--------+--------------------+-------------------+ CCA Prox  103     29                                  intimal thickening  +----------+--------+--------+--------+--------------------+-------------------+ CCA Distal83      22                                  intimal thickening  +----------+--------+--------+--------+--------------------+-------------------+ ICA Prox  92      26      1-39%   irregular and  mild to moderate                                      heterogenous        plaque              +----------+--------+--------+--------+--------------------+-------------------+ ICA Mid   65      25                                  tortuous            +----------+--------+--------+--------+--------------------+-------------------+ ICA Distal180     63      40-59%                      tortuous            +----------+--------+--------+--------+--------------------+-------------------+ ECA       97      20              heterogenous and    moderate plaque                                       irregular           with acoustic                                                             shadowing           +----------+--------+--------+--------+--------------------+-------------------+ +----------+--------+--------+--------+-------------------+  SubclavianPSV cm/sEDV cm/sDescribeArm Pressure (mmHG) +----------+--------+--------+--------+-------------------+           88                                          +----------+--------+--------+--------+-------------------+ +---------+--------+--+--------+--+---------+ VertebralPSV cm/s91EDV cm/s32Antegrade +---------+--------+--+--------+--+---------+  Summary: Right Carotid: Velocities in the right ICA are consistent with a 40-59%                stenosis. Left Carotid: Velocities in the left ICA are consistent with a 40-59% stenosis. Vertebrals:  Bilateral vertebral arteries demonstrate antegrade flow. Rt Doppler              flow was atypical appears to have a near occlusion. Subclavians: Normal flow hemodynamics were seen in bilateral subclavian              arteries. *See table(s) above for measurements and observations.     Preliminary     PHYSICAL EXAM Pleasant middle aged african [place] male not in distress. He has left BKA. . Afebrile. Head is nontraumatic. Neck is supple without bruit.    Cardiac exam no murmur or gallop. Lungs are clear to auscultation. Distal pulses are well felt except in LLE. Neurological Exam ;  Awake  Alert oriented x 3. Normal speech and language.eye movements full without nystagmus saccadic dysmetria on left gaze..fundi were not visualized. Vision acuity and fields appear normal. Hearing is normal. Palatal movements are normal. Face symmetric. Tongue midline. Normal strength, tone, reflexes and coordination.  Normal sensation. Gait deferred.  ASSESSMENT/PLAN Mr. Kristopher AquasDavid Ruiz is a 68 y.o. male with history of HTN and tobacco abuse presenting with dizzy w/ room spinning, .   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  CTA head & neck dominant R VA occluded at lower cervical region with  reconstitution in upper cervical region.  Some antegrade flow distal R VA appears to be only supply to the basilar.  R PICA not seen.  Small L VA terminates in L PICA.  B ICA bifurcations and bulbs with atherosclerosis, 70% stenosis in bulbs.  2D Echo EF 60-65%. No source of embolus   LDL 120  HgbA1c 6.2  Lovenox 40 mg sq daily for VTE prophylaxis  aspirin 81 mg daily prior to admission, now on clopidogrel 75 mg daily.  Given intracranial atherosclerosis, DAPT recommended x 3 months then PLAVIX alone.  Orders adjusted. Therapy recommendations: Home health  disposition: Home Essential Hypertension  Stable . Permissive hypertension (OK if < 220/120) but gradually normalize in 5-7 days . Long-term BP goal normotensive  Hyperlipidemia  Home meds: Lipitor 20  Now on Lipitor 80  LDL 120, goal < 70  Continue statin at discharge  Other Stroke Risk Factors  Advanced age  UDS positive for THC, advised to stop using marrijuana  Cigarette smoker, advised to stop smoking. Using nicotine patch.  Overweight, Body mass index is 29.06 kg/m., recommend weight loss, diet and exercise as appropriate   Other Active Problems  L BKA d/t traumatic amputation  GERD  Hospital day # 2  Recommend dual antiplatelet therapy of aspirin and Plavix  x 3 weeks and then Plavix alone Aggressive risk factor modification. .D/w Dr Jerral RalphGhimire.  Patient counseled to quit smoking cigarettes and marijuana.  Follow-up as an outpatient in the stroke clinic in 6 weeks or call earlier if necessary.     Delia HeadyPramod Nadav Swindell, MD Medical Director [facility] Pager: [phone] 09/08/2018 3:20 PM   To contact Stroke Continuity provider, please refer to [email or web address]. After hours, contact General Neurology

## 2018-09-08 NOTE — Progress Notes (Signed)
D/C instructions provided to patient, denies questions/concerns at this time. Patient transported to front entrance via WC, tol well. 

## 2018-09-08 NOTE — Progress Notes (Signed)
Physical Therapy Treatment Patient Details Name: Kristopher Ruiz MRN: [id number] DOB: 04-09-1950 Today's Date: 09/08/2018    History of Present Illness Pt is a 68 y.o. male with medical history significant of HTN, HLD, left BKA, and history of trauma to the right; who presented with complaints of dizziness. 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    PT Comments    Patient ambulated with a cane today. He required increased assist and required min a at times for balance. He would benefit from a RW at this time at home. Hopefully he can progress to the point where he can use a can. He may do better with a shoe on. He would benefit from home health to return to prior level of function.    Follow Up Recommendations  Home health PT     Equipment Recommendations  Rolling walker with 5" wheels    Recommendations for Other Services       Precautions / Restrictions Precautions Precautions: Fall Restrictions Weight Bearing Restrictions: No    Mobility  Bed Mobility Overal bed mobility: Modified Independent                Transfers Overall transfer level: Needs assistance Equipment used: Rolling walker (2 wheeled);Straight cane Transfers: Sit to/from Stand Sit to Stand: Min guard         General transfer comment: Min guard for balance using cane   Ambulation/Gait Ambulation/Gait assistance: Min guard;Min assist Gait Distance (Feet): 75 Feet Assistive device: Straight cane   Gait velocity: decreased    General Gait Details: Patient reuqired min a  at time using single point cane. He required mod cuing and gaurding using the cane. Patient is safest with his walker.    Stairs             Wheelchair Mobility    Modified Rankin (Stroke Patients Only) Modified Rankin (Stroke Patients Only) Pre-Morbid Rankin Score: No  symptoms Modified Rankin: Moderately severe disability     Balance Overall balance assessment: Needs assistance Sitting-balance support: No upper extremity supported;Feet supported Sitting balance-Leahy Scale: Good     Standing balance support: No upper extremity supported;During functional activity Standing balance-Leahy Scale: Fair Standing balance comment: Fair-poor without UE support. With RW, fair balance                             Cognition Arousal/Alertness: Awake/alert Behavior During Therapy: WFL for tasks assessed/performed Overall Cognitive Status: No family/caregiver present to determine baseline cognitive functioning Area of Impairment: Problem solving                             Problem Solving: Slow processing        Exercises      General Comments        Pertinent Vitals/Pain Pain Assessment: No/denies pain    Home Living                      Prior Function            PT Goals (current goals can now be found in the care plan section) Acute Rehab PT Goals Patient Stated Goal: get home  PT Goal Formulation: With patient    Frequency           PT  Plan Current plan remains appropriate    Co-evaluation              AM-PAC PT "6 Clicks" Mobility   Outcome Measure  Help needed turning from your back to your side while in a flat bed without using bedrails?: None Help needed moving from lying on your back to sitting on the side of a flat bed without using bedrails?: None Help needed moving to and from a bed to a chair (including a wheelchair)?: A Little Help needed standing up from a chair using your arms (e.g., wheelchair or bedside chair)?: A Little Help needed to walk in hospital room?: A Little Help needed climbing 3-5 steps with a railing? : A Lot 6 Click Score: 19    End of Session         PT Visit Diagnosis: Other abnormalities of gait and mobility (R26.89)     Time: 1030-1051 PT Time  Calculation (min) (ACUTE ONLY): 21 min  Charges:  $Gait Training: 8-22 mins                       Carney Living PT DPT  09/08/2018, 11:07 AM

## 2018-09-10 ENCOUNTER — Other Ambulatory Visit: Payer: Self-pay | Admitting: Internal Medicine

## 2018-09-10 MED ORDER — DILTIAZEM HCL ER BEADS 120 MG PO CP24: 120.0000 mg | ORAL_CAPSULE | Freq: Every day | ORAL | 2 refills | Status: DC

## 2018-09-10 MED ORDER — METOPROLOL SUCCINATE ER 50 MG PO TB24: 50.0000 mg | ORAL_TABLET | Freq: Every day | ORAL | 2 refills | Status: DC

## 2018-09-10 MED ORDER — GABAPENTIN 100 MG PO CAPS: 100.0000 mg | ORAL_CAPSULE | Freq: Three times a day (TID) | ORAL | 2 refills | Status: DC

## 2018-09-10 MED ORDER — OMEPRAZOLE 40 MG PO CPDR: 40.0000 mg | DELAYED_RELEASE_CAPSULE | Freq: Every day | ORAL | 2 refills | Status: DC

## 2018-09-10 MED ORDER — ATORVASTATIN CALCIUM 80 MG PO TABS: 80.0000 mg | ORAL_TABLET | Freq: Every day | ORAL | 11 refills | Status: DC

## 2018-09-10 MED ORDER — HYDROCHLOROTHIAZIDE 25 MG PO TABS: 25.0000 mg | ORAL_TABLET | Freq: Every day | ORAL | 2 refills | Status: DC

## 2018-09-13 ENCOUNTER — Emergency Department (HOSPITAL_COMMUNITY): Payer: Medicare Other

## 2018-09-13 ENCOUNTER — Other Ambulatory Visit: Payer: Self-pay

## 2018-09-13 ENCOUNTER — Encounter (HOSPITAL_COMMUNITY): Payer: Self-pay | Admitting: Emergency Medicine

## 2018-09-13 ENCOUNTER — Inpatient Hospital Stay (HOSPITAL_COMMUNITY)
Admission: EM | Admit: 2018-09-13 | Discharge: 2018-09-15 | DRG: 066 | Disposition: A | Discharge status: Home Health | Payer: Medicare Other | Attending: Internal Medicine | Admitting: Internal Medicine

## 2018-09-13 DIAGNOSIS — K219 Gastro-esophageal reflux disease without esophagitis: Secondary | ICD-10-CM | POA: Diagnosis present

## 2018-09-13 DIAGNOSIS — R29810 Facial weakness: Secondary | ICD-10-CM | POA: Diagnosis present

## 2018-09-13 DIAGNOSIS — I639 Cerebral infarction, unspecified: Secondary | ICD-10-CM | POA: Diagnosis present

## 2018-09-13 DIAGNOSIS — I63541 Cerebral infarction due to unspecified occlusion or stenosis of right cerebellar artery: Principal | ICD-10-CM | POA: Diagnosis present

## 2018-09-13 DIAGNOSIS — R471 Dysarthria and anarthria: Secondary | ICD-10-CM | POA: Diagnosis present

## 2018-09-13 DIAGNOSIS — G459 Transient cerebral ischemic attack, unspecified: Secondary | ICD-10-CM | POA: Insufficient documentation

## 2018-09-13 DIAGNOSIS — Z89512 Acquired absence of left leg below knee: Secondary | ICD-10-CM

## 2018-09-13 DIAGNOSIS — I6501 Occlusion and stenosis of right vertebral artery: Secondary | ICD-10-CM

## 2018-09-13 DIAGNOSIS — F17210 Nicotine dependence, cigarettes, uncomplicated: Secondary | ICD-10-CM | POA: Diagnosis present

## 2018-09-13 DIAGNOSIS — I6523 Occlusion and stenosis of bilateral carotid arteries: Secondary | ICD-10-CM | POA: Diagnosis present

## 2018-09-13 DIAGNOSIS — R739 Hyperglycemia, unspecified: Secondary | ICD-10-CM | POA: Diagnosis present

## 2018-09-13 DIAGNOSIS — Z7902 Long term (current) use of antithrombotics/antiplatelets: Secondary | ICD-10-CM | POA: Diagnosis not present

## 2018-09-13 DIAGNOSIS — Z1159 Encounter for screening for other viral diseases: Secondary | ICD-10-CM

## 2018-09-13 DIAGNOSIS — E785 Hyperlipidemia, unspecified: Secondary | ICD-10-CM | POA: Diagnosis present

## 2018-09-13 DIAGNOSIS — R29703 NIHSS score 3: Secondary | ICD-10-CM | POA: Diagnosis present

## 2018-09-13 DIAGNOSIS — I10 Essential (primary) hypertension: Secondary | ICD-10-CM | POA: Diagnosis not present

## 2018-09-13 DIAGNOSIS — Z720 Tobacco use: Secondary | ICD-10-CM | POA: Diagnosis not present

## 2018-09-13 DIAGNOSIS — Z8249 Family history of ischemic heart disease and other diseases of the circulatory system: Secondary | ICD-10-CM | POA: Diagnosis not present

## 2018-09-13 DIAGNOSIS — I69993 Ataxia following unspecified cerebrovascular disease: Secondary | ICD-10-CM | POA: Diagnosis not present

## 2018-09-13 LAB — CBC
HCT: 37.9 % — ABNORMAL LOW (ref 39.0–52.0)
HCT: 39.7 % (ref 39.0–52.0)
Hemoglobin: 12.1 g/dL — ABNORMAL LOW (ref 13.0–17.0)
Hemoglobin: 13.1 g/dL (ref 13.0–17.0)
MCH: 26.9 pg (ref 26.0–34.0)
MCH: 27.4 pg (ref 26.0–34.0)
MCHC: 31.9 g/dL (ref 30.0–36.0)
MCHC: 33 g/dL (ref 30.0–36.0)
MCV: 84.4 fL (ref 80.0–100.0)
MCV: 83.1 fL (ref 80.0–100.0)
Platelets: 187 10*3/uL (ref 150–400)
Platelets: 199 10*3/uL (ref 150–400)
RBC: 4.49 MIL/uL (ref 4.22–5.81)
RBC: 4.78 MIL/uL (ref 4.22–5.81)
RDW: 15.1 % (ref 11.5–15.5)
RDW: 15.1 % (ref 11.5–15.5)
WBC: 7.2 10*3/uL (ref 4.0–10.5)
WBC: 7.9 10*3/uL (ref 4.0–10.5)
nRBC: 0 % (ref 0.0–0.2)
nRBC: 0 % (ref 0.0–0.2)

## 2018-09-13 LAB — RAPID URINE DRUG SCREEN, HOSP PERFORMED
Amphetamines: NOT DETECTED
Barbiturates: NOT DETECTED
Benzodiazepines: NOT DETECTED
Cocaine: NOT DETECTED
Opiates: NOT DETECTED
Tetrahydrocannabinol: POSITIVE — AB

## 2018-09-13 LAB — COMPREHENSIVE METABOLIC PANEL
ALT: 25 U/L (ref 0–44)
AST: 27 U/L (ref 15–41)
Albumin: 3.6 g/dL (ref 3.5–5.0)
Alkaline Phosphatase: 84 U/L (ref 38–126)
Anion gap: 8 (ref 5–15)
BUN: 13 mg/dL (ref 8–23)
CO2: 25 mmol/L (ref 22–32)
Calcium: 9 mg/dL (ref 8.9–10.3)
Chloride: 104 mmol/L (ref 98–111)
Creatinine, Ser: 0.88 mg/dL (ref 0.61–1.24)
GFR calc Af Amer: >60 mL/min (ref >60)
GFR calc non Af Amer: >60 mL/min (ref >60)
Glucose, Bld: 194 mg/dL — ABNORMAL HIGH (ref 70–99)
Potassium: 3.7 mmol/L (ref 3.5–5.1)
Sodium: 137 mmol/L (ref 135–145)
Total Bilirubin: 0.6 mg/dL (ref 0.3–1.2)
Total Protein: 6.3 g/dL — ABNORMAL LOW (ref 6.5–8.1)

## 2018-09-13 LAB — DIFFERENTIAL
Abs Immature Granulocytes: 0.04 10*3/uL (ref 0.00–0.07)
Basophils Absolute: 0 10*3/uL (ref 0.0–0.1)
Basophils Relative: 1 %
Eosinophils Absolute: 0.1 10*3/uL (ref 0.0–0.5)
Eosinophils Relative: 1 %
Immature Granulocytes: 1 %
Lymphocytes Relative: 34 %
Lymphs Abs: 2.5 10*3/uL (ref 0.7–4.0)
Monocytes Absolute: 0.6 10*3/uL (ref 0.1–1.0)
Monocytes Relative: 8 %
Neutro Abs: 4 10*3/uL (ref 1.7–7.7)
Neutrophils Relative %: 55 %

## 2018-09-13 LAB — URINALYSIS, ROUTINE W REFLEX MICROSCOPIC
Bilirubin Urine: NEGATIVE
Glucose, UA: NEGATIVE mg/dL
Hgb urine dipstick: NEGATIVE
Ketones, ur: NEGATIVE mg/dL
Leukocytes,Ua: NEGATIVE
Nitrite: NEGATIVE
Protein, ur: NEGATIVE mg/dL
Specific Gravity, Urine: >1.046 — ABNORMAL HIGH (ref 1.005–1.030)
pH: 7 (ref 5.0–8.0)

## 2018-09-13 LAB — CREATININE, SERUM
Creatinine, Ser: 0.69 mg/dL (ref 0.61–1.24)
GFR calc Af Amer: >60 mL/min (ref >60)
GFR calc non Af Amer: >60 mL/min (ref >60)

## 2018-09-13 LAB — PROTIME-INR
INR: 1.1 (ref 0.8–1.2)
Prothrombin Time: 13.9 seconds (ref 11.4–15.2)

## 2018-09-13 LAB — TROPONIN I: Troponin I: <0.03 ng/mL (ref <0.03)

## 2018-09-13 LAB — ETHANOL: Alcohol, Ethyl (B): <10 mg/dL (ref <10)

## 2018-09-13 LAB — APTT: aPTT: 21 seconds — ABNORMAL LOW (ref 24–36)

## 2018-09-13 LAB — GLUCOSE, CAPILLARY: Glucose-Capillary: 118 mg/dL — ABNORMAL HIGH (ref 70–99)

## 2018-09-13 LAB — SARS CORONAVIRUS 2: SARS Coronavirus 2: NOT DETECTED

## 2018-09-13 LAB — I-STAT CREATININE, ED: Creatinine, Ser: 0.9 mg/dL (ref 0.61–1.24)

## 2018-09-13 MED ORDER — GABAPENTIN 100 MG PO CAPS
100.0000 mg | ORAL_CAPSULE | Freq: Three times a day (TID) | ORAL | Status: DC
  Administered 2018-09-13 – 2018-09-15 (×6): 100 mg via ORAL
  Filled 2018-09-13 (×6): qty 1

## 2018-09-13 MED ORDER — ATORVASTATIN CALCIUM 80 MG PO TABS
80.0000 mg | ORAL_TABLET | Freq: Every day | ORAL | Status: DC
  Administered 2018-09-13 – 2018-09-14 (×2): 80 mg via ORAL
  Filled 2018-09-13 (×2): qty 1

## 2018-09-13 MED ORDER — ENOXAPARIN SODIUM 40 MG/0.4ML ~~LOC~~ SOLN
40.0000 mg | SUBCUTANEOUS | Status: DC
  Administered 2018-09-13 – 2018-09-14 (×2): 40 mg via SUBCUTANEOUS
  Filled 2018-09-13 (×2): qty 0.4

## 2018-09-13 MED ORDER — ASPIRIN EC 81 MG PO TBEC
81.0000 mg | DELAYED_RELEASE_TABLET | Freq: Every day | ORAL | Status: DC
  Administered 2018-09-13 – 2018-09-14 (×2): 81 mg via ORAL
  Filled 2018-09-13 (×2): qty 1

## 2018-09-13 MED ORDER — SODIUM CHLORIDE 0.9 % IV BOLUS
1000.0000 mL | Freq: Once | INTRAVENOUS | Status: AC
  Administered 2018-09-13: 1000 mL via INTRAVENOUS

## 2018-09-13 MED ORDER — PANTOPRAZOLE SODIUM 40 MG PO TBEC
40.0000 mg | DELAYED_RELEASE_TABLET | Freq: Every day | ORAL | Status: DC
  Administered 2018-09-13 – 2018-09-15 (×3): 40 mg via ORAL
  Filled 2018-09-13 (×3): qty 1

## 2018-09-13 MED ORDER — ONDANSETRON HCL 4 MG/2ML IJ SOLN: 4.0000 mg | Freq: Four times a day (QID) | INTRAMUSCULAR | Status: DC | PRN

## 2018-09-13 MED ORDER — ACETAMINOPHEN 500 MG PO TABS
1000.0000 mg | ORAL_TABLET | ORAL | Status: AC
  Administered 2018-09-13: 1000 mg via ORAL
  Filled 2018-09-13: qty 2

## 2018-09-13 MED ORDER — IOHEXOL 350 MG/ML SOLN
100.0000 mL | Freq: Once | INTRAVENOUS | Status: AC | PRN
  Administered 2018-09-13: 100 mL via INTRAVENOUS

## 2018-09-13 MED ORDER — ACETAMINOPHEN 650 MG RE SUPP: 650.0000 mg | RECTAL | Status: DC | PRN

## 2018-09-13 MED ORDER — INSULIN ASPART 100 UNIT/ML ~~LOC~~ SOLN: 0.0000 [IU] | Freq: Three times a day (TID) | SUBCUTANEOUS | Status: DC

## 2018-09-13 MED ORDER — CLOPIDOGREL BISULFATE 75 MG PO TABS
75.0000 mg | ORAL_TABLET | Freq: Every day | ORAL | Status: DC
  Administered 2018-09-13 – 2018-09-15 (×3): 75 mg via ORAL
  Filled 2018-09-13 (×3): qty 1

## 2018-09-13 MED ORDER — ACETAMINOPHEN 500 MG PO TABS: 1000.0000 mg | ORAL_TABLET | Freq: Four times a day (QID) | ORAL | Status: DC | PRN

## 2018-09-13 MED ORDER — NICOTINE 21 MG/24HR TD PT24
21.0000 mg | MEDICATED_PATCH | Freq: Every day | TRANSDERMAL | Status: DC
  Administered 2018-09-13 – 2018-09-14 (×2): 21 mg via TRANSDERMAL
  Filled 2018-09-13 (×2): qty 1

## 2018-09-13 MED ORDER — ACETAMINOPHEN 160 MG/5ML PO SOLN: 650.0000 mg | ORAL | Status: DC | PRN

## 2018-09-13 MED ORDER — ACETAMINOPHEN 500 MG PO TABS: 1000.0000 mg | ORAL_TABLET | Freq: Once | ORAL | Status: DC

## 2018-09-13 MED ORDER — LIDOCAINE HCL 1 % IJ SOLN
INTRAMUSCULAR | Status: AC
  Filled 2018-09-13: qty 20

## 2018-09-13 MED ORDER — ACETAMINOPHEN 325 MG PO TABS
650.0000 mg | ORAL_TABLET | ORAL | Status: DC | PRN
  Administered 2018-09-14 – 2018-09-15 (×4): 650 mg via ORAL
  Filled 2018-09-13 (×4): qty 2

## 2018-09-13 NOTE — ED Notes (Signed)
RN attempted to call report. Was told to call back.

## 2018-09-13 NOTE — H&P (Signed)
History and Physical:    Kristopher Ruiz   RUE:[id number] DOB: [birth year] DOA: 09/13/2018  Referring MD/provider: Dr. Pilar Plate PCP: Patient, No Pcp Per   Patient coming from: Home  Chief Complaint: "Slobbering and talking slurry"  History of Present Illness:   Kristopher Ruiz is an 68 y.o. 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  Patient is adamant that he was not speaking this way until this morning.  He also noted that the dizziness definitely started this morning and not previously.  At present patient states both of his symptoms have resolved and he feels back to baseline  ED Course:  The patient was initially noted to be somewhat somnolent and have some slurring of his words.  A code stroke was called and he was seen by neurology and had an extensive work-up.  A CT angiogram was performed which showed a diseased right vertebral artery and possibly a new occlusion in the right P1 segment.  Neurology recommended a cerebral angiogram to the patient however he refused.  It was recommended that he be admitted for an MRI and possible cerebral angiogram in the morning if patient has changed his mind.  He is to remain on dual antiplatelet therapy without change.  ROS:   ROS   Review of Systems: General: No fever, chills, weight changes Skin: No rashes, lesions, wounds Endocrine: no heat/cold intolerance, no polyuria Respiratory: No cough,, shortness of breath, hemoptysis Cardiovascular: No palpitations, chest pain GI: No nausea, vomiting, diarrhea, constipation GU: No dysuria, increased frequency   Past Medical History:   Past Medical History:  Diagnosis Date   Hypertension     Past Surgical History:   Past Surgical  History:  Procedure Laterality Date   HIP FRACTURE SURGERY     LEG AMPUTATION BELOW KNEE      Social History:   Social History   Socioeconomic History   Marital status: Legally Separated    Spouse name: Not on file   Number of children: Not on file   Years of education: Not on file   Highest education level: Not on file  Occupational History   Not on file  Social Needs   Financial resource strain: Not on file   Food insecurity    Worry: Not on file    Inability: Not on file   Transportation needs    Medical: Not on file    Non-medical: Not on file  Tobacco Use   Smoking status: Current Every Day Smoker    Types: Cigarettes   Smokeless tobacco: Never Used  Substance and Sexual Activity   Alcohol use: No   Drug use: No   Sexual activity: Not on file  Lifestyle   Physical activity    Days per week: Not on file    Minutes per session: Not on file   Stress: Not on file  Relationships   Social connections    Talks on phone: Not on file    Gets together: Not on file    Attends religious service: Not on file    Active member of club or organization: Not on file    Attends meetings of clubs or organizations: Not on file    Relationship status: Not on file   Intimate partner violence  Fear of current or ex partner: Not on file    Emotionally abused: Not on file    Physically abused: Not on file    Forced sexual activity: Not on file  Other Topics Concern   Not on file  Social History Narrative   Not on file    Allergies   Ibuprofen  Family history:   Family History  Problem Relation Age of Onset   Hypertension Mother    Hypertension Father     Current Medications:   Prior to Admission medications   Medication Sig Start Date End Date Taking? Authorizing Provider  acetaminophen (TYLENOL) 500 MG tablet Take 1,000 mg by mouth every 6 (six) hours as needed for mild pain or headache.   Yes [provider]  atorvastatin  (LIPITOR) 80 MG tablet Take 1 tablet (80 mg total) by mouth daily for 30 days. 09/10/18 10/10/18 Yes Dorcas CarrowGhimire, Kuber, MD  clopidogrel (PLAVIX) 75 MG tablet Take 1 tablet (75 mg total) by mouth daily for 30 days. 09/08/18 10/08/18 Yes Ghimire, Lyndel SafeKuber, MD  diltiazem (TIAZAC) 120 MG 24 hr capsule Take 1 capsule (120 mg total) by mouth daily. 09/10/18 12/09/18 Yes Dorcas CarrowGhimire, Kuber, MD  gabapentin (NEURONTIN) 100 MG capsule Take 1 capsule (100 mg total) by mouth 3 (three) times daily. 09/10/18 12/09/18 Yes Dorcas CarrowGhimire, Kuber, MD  metoprolol succinate (TOPROL-XL) 50 MG 24 hr tablet Take 1 tablet (50 mg total) by mouth daily. Take with or immediately following a meal. 09/10/18 12/09/18 Yes Ghimire, Lyndel SafeKuber, MD  nicotine (NICODERM CQ - DOSED IN MG/24 HOURS) 21 mg/24hr patch Place 1 patch (21 mg total) onto the skin daily for 14 days. 09/08/18 09/22/18 Yes Ghimire, Lyndel SafeKuber, MD  omeprazole (PRILOSEC) 40 MG capsule Take 1 capsule (40 mg total) by mouth daily. 09/10/18 12/09/18 Yes Ghimire, Lyndel SafeKuber, MD  hydrochlorothiazide (HYDRODIURIL) 25 MG tablet Take 1 tablet (25 mg total) by mouth daily for 30 days. Patient not taking: Reported on 09/13/2018 09/10/18 10/10/18  Dorcas CarrowGhimire, Kuber, MD    Physical Exam:   Vitals:   09/13/18 1425 09/13/18 1430 09/13/18 1530 09/13/18 1900  BP:  (!) 156/85 (!) 150/90 (!) 164/99  Pulse:  87  78  Resp: 20 20 (!) 22 16  Temp:      TempSrc:      SpO2:  100%  100%     Physical Exam: Blood pressure (!) 164/99, pulse 78, temperature 97.6 F (36.4 C), temperature source Oral, resp. rate 16, SpO2 100 %. Gen: Thin man appearing much older than stated age lying flat in bed in no acute distress.  He is able to speak in full sentences without any notable dysarthria or a aphasia.  He has a normal level of consciousness. Eyes: Sclerae anicteric. Conjunctiva mildly injected. Chest: Moderately good air entry bilaterally with no adventitious sounds.  CV: Distant, regular, no audible murmurs. Abdomen: NABS, soft,  nondistended, nontender. No tenderness to light or deep palpation. No rebound, no guarding. Extremities: No edema.  Skin: Warm and dry. No rashes, lesions or wounds. Neuro: Alert and oriented times 3; grossly nonfocal. Psych: Patient is cooperative, logical and coherent.  Data Review:    Labs: Basic Metabolic Panel: Recent Labs  Lab 09/13/18 1128 09/13/18 1137  NA 137  --   K 3.7  --   CL 104  --   CO2 25  --   GLUCOSE 194*  --   BUN 13  --   CREATININE 0.88 0.90  CALCIUM 9.0  --  Liver Function Tests: Recent Labs  Lab 09/13/18 1128  AST 27  ALT 25  ALKPHOS 84  BILITOT 0.6  PROT 6.3*  ALBUMIN 3.6   No results for input(s): LIPASE, AMYLASE in the last 168 hours. No results for input(s): AMMONIA in the last 168 hours. CBC: Recent Labs  Lab 09/13/18 1128  WBC 7.2  NEUTROABS 4.0  HGB 12.1*  HCT 37.9*  MCV 84.4  PLT 187   Cardiac Enzymes: Recent Labs  Lab 09/13/18 1128  TROPONINI <0.03    BNP (last 3 results) No results for input(s): PROBNP in the last 8760 hours. CBG: Recent Labs  Lab 09/06/18 2010 09/07/18 0025 09/08/18 0000  GLUCAP 79 89 92    Urinalysis    Component Value Date/Time   COLORURINE YELLOW 09/06/2018 1655   APPEARANCEUR CLEAR 09/06/2018 1655   LABSPEC 1.042 (H) 09/06/2018 1655   PHURINE 6.0 09/06/2018 1655   GLUCOSEU NEGATIVE 09/06/2018 1655   HGBUR NEGATIVE 09/06/2018 1655   BILIRUBINUR NEGATIVE 09/06/2018 1655   KETONESUR 20 (A) 09/06/2018 1655   PROTEINUR NEGATIVE 09/06/2018 1655   NITRITE NEGATIVE 09/06/2018 1655   LEUKOCYTESUR NEGATIVE 09/06/2018 1655      Radiographic Studies: Ct Angio Head W Or Wo Contrast  Result Date: 09/13/2018 CLINICAL DATA:  Left-sided weakness. Slurred speech. Abnormal head CT. EXAM: CT ANGIOGRAPHY HEAD AND NECK TECHNIQUE: Multidetector CT imaging of the head and neck was performed using the standard protocol during bolus administration of intravenous contrast. Multiplanar CT image  reconstructions and MIPs were obtained to evaluate the vascular anatomy. Carotid stenosis measurements (when applicable) are obtained utilizing NASCET criteria, using the distal internal carotid diameter as the denominator. CONTRAST:  OMNIPAQUE IOHEXOL 350 MG/ML SOLN COMPARISON:  CT earlier same day.  CT a 09/06/2018 MRI 09/06/2018 FINDINGS: CTA NECK FINDINGS Aortic arch: Aortic atherosclerosis. Branching pattern is normal. No flow limiting origin stenosis. Right carotid system: No change since the previous study. Atherosclerotic disease at the carotid bifurcation and ICA bulb. Minimal diameter of the proximal ICA is 1.5 mm, consistent with a 70% stenosis. Left carotid system: No change since the previous study. Atherosclerotic disease at the carotid bifurcation and ICA bulb. Minimal diameter of 1.5 mm consistent with a 70% stenosis. Vertebral arteries: No change on the left. Non dominant vertebral artery widely patent at the origin and through the cervical region, terminating in PICA. Dominant right vertebral artery shows stenosis at its origin estimated at 50%. Scattered foci of plaque in the proximal several cm of vessel with additional serial stenoses. Previously, the vessel did not show flow beyond C6. On today's study, low is present extending up to C3. No flow demonstrated in the upper cervical region. Vessel reconstitutes by cervical collaterals at the C1 level and is patent through the foramen magnum to the basilar. Skeleton: Degenerative spondylosis as shown previously. Other neck: Negative Upper chest: Negative except for emphysema and scarring as shown previously. Review of the MIP images confirms the above findings CTA HEAD FINDINGS Anterior circulation: Both internal carotid arteries are patent through the skull base and siphon regions. Atherosclerotic calcification in the carotid siphon regions but no stenosis greater than 30%. The anterior and middle cerebral vessels are patent without proximal  stenosis, aneurysm vascular malformation. Posterior circulation: Left vertebral artery terminates in PICA as seen previously, without visible supply to the basilar. The reconstituted right vertebral arteries patent through the foramen magnum to the basilar. No right PICA is visible. There is a patent right anterior inferior cerebellar artery.  Basilar artery is patent without evidence of embolic occlusion. In correlation with the noncontrast CT, it is possible that there is a distal embolus of similar density as the enhanced vessel. No flow is seen in the right superior cerebellar artery on today's study. There is also no flow or stenosis seen within the first 3 mm of the left P1 segment, unchanged since previous study, consistent with some embolus in that location. Venous sinuses: Patent and normal. Anatomic variants: None significant otherwise. Delayed phase: Not performed. Review of the MIP images confirms the above findings IMPRESSION: Following treatment, there is more flow in the right vertebral artery, now showing flow up to about the C3 level. There continues to be a segment of vessel at C2 which does not show any flow. Reconstituted flow in the distal right vertebral artery at C1. On today's study, there is no flow in the right superior cerebellar artery, which did show patency on the previous study. There is also an absence of demonstrable flow in the proximal 3 mm of the left P1 segment, which was widely patent previously. These findings are consistent with additional distal emboli or embolic migration since the study of 1 week ago. No acute anterior circulation finding/change. 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 Electronically Signed   By: Paulina FusiMark  Shogry M.D.   On: 09/13/2018 13:00   Ct Angio Neck W Or Wo Contrast  Result Date: 09/13/2018 CLINICAL DATA:  Left-sided weakness. Slurred speech. Abnormal head CT. EXAM: CT  ANGIOGRAPHY HEAD AND NECK TECHNIQUE: Multidetector CT imaging of the head and neck was performed using the standard protocol during bolus administration of intravenous contrast. Multiplanar CT image reconstructions and MIPs were obtained to evaluate the vascular anatomy. Carotid stenosis measurements (when applicable) are obtained utilizing NASCET criteria, using the distal internal carotid diameter as the denominator. CONTRAST:  100mL OMNIPAQUE IOHEXOL 350 MG/ML SOLN COMPARISON:  CT earlier same day.  CT a 09/06/2018 MRI 09/06/2018 FINDINGS: CTA NECK FINDINGS Aortic arch: Aortic atherosclerosis. Branching pattern is normal. No flow limiting origin stenosis. Right carotid system: No change since the previous study. Atherosclerotic disease at the carotid bifurcation and ICA bulb. Minimal diameter of the proximal ICA is 1.5 mm, consistent with a 70% stenosis. Left carotid system: No change since the previous study. Atherosclerotic disease at the carotid bifurcation and ICA bulb. Minimal diameter of 1.5 mm consistent with a 70% stenosis. Vertebral arteries: No change on the left. Non dominant vertebral artery widely patent at the origin and through the cervical region, terminating in PICA. Dominant right vertebral artery shows stenosis at its origin estimated at 50%. Scattered foci of plaque in the proximal several cm of vessel with additional serial stenoses. Previously, the vessel did not show flow beyond C6. On today's study, low is present extending up to C3. No flow demonstrated in the upper cervical region. Vessel reconstitutes by cervical collaterals at the C1 level and is patent through the foramen magnum to the basilar. Skeleton: Degenerative spondylosis as shown previously. Other neck: Negative Upper chest: Negative except for emphysema and scarring as shown previously. Review of the MIP images confirms the above findings CTA HEAD FINDINGS Anterior circulation: Both internal carotid arteries are patent  through the skull base and siphon regions. Atherosclerotic calcification in the carotid siphon regions but no stenosis greater than 30%. The anterior and middle cerebral vessels are patent without proximal stenosis, aneurysm vascular malformation. Posterior circulation: Left vertebral artery terminates in PICA as  seen previously, without visible supply to the basilar. The reconstituted right vertebral arteries patent through the foramen magnum to the basilar. No right PICA is visible. There is a patent right anterior inferior cerebellar artery. Basilar artery is patent without evidence of embolic occlusion. In correlation with the noncontrast CT, it is possible that there is a distal embolus of similar density as the enhanced vessel. No flow is seen in the right superior cerebellar artery on today's study. There is also no flow or stenosis seen within the first 3 mm of the left P1 segment, unchanged since previous study, consistent with some embolus in that location. Venous sinuses: Patent and normal. Anatomic variants: None significant otherwise. Delayed phase: Not performed. Review of the MIP images confirms the above findings IMPRESSION: Following treatment, there is more flow in the right vertebral artery, now showing flow up to about the C3 level. There continues to be a segment of vessel at C2 which does not show any flow. Reconstituted flow in the distal right vertebral artery at C1. On today's study, there is no flow in the right superior cerebellar artery, which did show patency on the previous study. There is also an absence of demonstrable flow in the proximal 3 mm of the left P1 segment, which was widely patent previously. These findings are consistent with additional distal emboli or embolic migration since the study of 1 week ago. No acute anterior circulation finding/change. 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 Electronically  Signed   By: Nelson Chimes M.D.   On: 09/13/2018 13:00   Mr Brain Wo Contrast  Result Date: 09/13/2018 CLINICAL DATA:  Stroke. EXAM: MRI HEAD WITHOUT CONTRAST TECHNIQUE: Multiplanar, multiecho pulse sequences of the brain and surrounding structures were obtained without intravenous contrast. COMPARISON:  MRI head 09/06/2018.  CT a head 09/13/2018 FINDINGS: Brain: Compared to the prior MRI of 09/06/2018, there has been extension of right superior cerebellar artery territory infarct. Previously noted right inferior cerebellar infarct is similar to the prior study. No other areas of acute infarct be sides the right cerebellum. No associated hemorrhage. Ventricle size normal. No midline shift. Mild chronic white matter changes bilaterally. No mass lesion. Vascular: Normal arterial flow voids Skull and upper cervical spine: Negative Sinuses/Orbits: Mucosal edema paranasal sinuses.  Negative orbit Other: None IMPRESSION: Interval extension of right superior cerebellar infarct compared with the MRI of 09/06/2018. Right PICA infarct appears unchanged. No interval hemorrhage. 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 Nonocclusive clot in the left P1 segment is present without evidence of left posterior cerebral artery infarct. Electronically Signed   By: Franchot Gallo M.D.   On: 09/13/2018 19:12   Ct Head Code Stroke Wo Contrast  Result Date: 09/13/2018 CLINICAL DATA:  Code stroke. Left-sided weakness and slurred speech. EXAM: CT HEAD WITHOUT CONTRAST TECHNIQUE: Contiguous axial images were obtained from the base of the skull through the vertex without intravenous contrast. COMPARISON:  Head MRI and CT/CTA 09/06/2018 FINDINGS: Brain: Well-defined low-density in the right cerebellum corresponds to the previously described infarct without evidence of substantial interval enlargement. Edema results in partial effacement of the fourth ventricle without evidence of hydrocephalus. Mild diffuse  hypoattenuation of the mid and upper pons is attributed to beam hardening given straight margins on sagittal reformats. No acute supratentorial infarct, intracranial hemorrhage, mass, midline shift, or extra-axial fluid collection is identified. Cerebral white matter hypodensities are unchanged and nonspecific but compatible with mild chronic small  vessel ischemic disease. Vascular: Calcified atherosclerosis at the skull base. New focal hyperdensity in the distal basilar artery. Skull: No fracture or focal osseous lesion. Sinuses/Orbits: Mild mucosal thickening in the paranasal sinuses. Clear mastoid air cells. Unremarkable orbits. Other: Partially visualized maxillary dental disease. ASPECTS [facility]([place] Stroke Program Early CT Score) - Ganglionic level infarction (caudate, lentiform nuclei, internal capsule, insula, M1-M3 cortex): 7 - Supraganglionic infarction (M4-M6 cortex): 3 Total score (0-10 with 10 being normal): 10 IMPRESSION: 1. Subacute right cerebellar infarct. 2. No evidence of acute supratentorial infarct or hemorrhage. ASPECTS is 10. 3. New hyperdense appearance of the distal basilar artery. Angiographic imaging is recommended to evaluate for embolus. These results were communicated to Dr. Wilford CornerArora at 11:57 am on 09/13/2018 by text page via the [facility] system. Electronically Signed   By: Sebastian AcheAllen  Grady M.D.   On: 09/13/2018 11:58    EKG: Independently reviewed.  Sinus rhythm at 76.  Normal intervals.  Normal axis.  Normal EKG.   Assessment/Plan:   Principal Problem:   TIA (transient ischemic attack) Active Problems:   Cerebellar stroke (HCC)   Hyperlipidemia   Essential hypertension   Patient with right posterior circulation stroke last week now with an episode of dysarthria and altered consciousness.  Noncontrast CT showed a possible hyperdense basilar lesion which is new from a week ago.  CT angiogram today also showed a possible new right P1 segment stenosis/occlusion.  And possibly  worsening right vertebral artery disease.  Patient refused to have a diagnostic cerebral angiogram today however states that he might be willing to have it tomorrow.  TIA Will admit for TIA work-up and possible new occlusion of the right vertebral artery per neurology recommendations. He is apparently supposed to be on dual antiplatelet therapy however I see that he is only on Plavix I do not see any aspirin, will add aspirin 81 mg to his Plavix.  Of note patient is supposed to be contraindicated to ibuprofen due to GI bleed however at this time I think that need for DAPT outweighs concerns for possible GI bleed. MRI brain without contrast ordered Further discussion re vertebral angiogram per neurology in the morning.  HYPERGLYCEMIA Patient without history of diabetes however does have hyperglycemia on BMP Check fingersticks AC at bedtime and cover with low-dose insulin as required for euglycemia  HTN I am holding patient's antihypertensives for permissive hypertension overnight  These can be restarted in the morning if warranted especially given possible stenosis of his vertebral artery  HL Continue atorvastatin 80 mg per home doses   Other information:   DVT prophylaxis: Lovenox ordered. Code Status: Full code. Family Communication: Patient's girlfriend spoke with him in house Disposition Plan: To be determined Consults called: Neurology Admission status: Inpatient  The medical decision making on this patient was of high complexity and the patient is at high risk for clinical deterioration, therefore this is a level 3 visit.    Horatio PelSrobona Tublu Asiah Befort [facility]  If 7PM-7AM, please contact night-coverage [email or web address] Password [email or web address] 09/13/2018, 7:24 PM

## 2018-09-13 NOTE — ED Notes (Signed)
Pt reports that at 0900 this date he was getting ready to go outside for a walk when he felt increased weakness to the left side. Pt reports he has slurred speech normally but felt that at 0900 his speech was worse then it is now.

## 2018-09-13 NOTE — ED Provider Notes (Signed)
[facility] Emergency Department Provider Note MRN:  [id number]  Arrival date & time: 09/13/18     Chief Complaint   Code Stroke   History of Present Illness   Kristopher Ruiz is a 68 y.o. year-old male with a history of hypertension presenting to the ED with chief complaint of near syncope, nausea, emesis.  Patient explains that his symptoms began at 9 AM this morning.  Headache, nausea, few episodes of nonbloody nonbilious emesis.  Near syncope due to dizziness.  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  Patient was also noting change in speech, slurred speech and left arm weakness that also began at 9 AM this morning.  Review of Systems  A complete 10 system review of systems was obtained and all systems are negative except as noted in the HPI and PMH.   Patient's Health History    Past Medical History:  Diagnosis Date  . Hypertension     Past Surgical History:  Procedure Laterality Date  . HIP FRACTURE SURGERY    . LEG AMPUTATION BELOW KNEE      Family History  Problem Relation Age of Onset  . Hypertension Mother   . Hypertension Father     Social History   Socioeconomic History  . Marital status: Legally Separated    Spouse name: Not on file  . Number of children: Not on file  . Years of education: Not on file  . Highest education level: Not on file  Occupational History  . Not on file  Social Needs  . Financial resource strain: Not on file  . Food insecurity    Worry: Not on file    Inability: Not on file  . Transportation needs    Medical: Not on file    Non-medical: Not on file  Tobacco Use  . Smoking status: Current Every Day Smoker    Types: Cigarettes  . Smokeless tobacco: Never Used  Substance and Sexual Activity  . Alcohol use: No  . Drug use: No  . Sexual activity: Not on file  Lifestyle  . Physical activity    Days per week: Not on file    Minutes per session: Not on file  . Stress: Not on file   Relationships  . Social Herbalist on phone: Not on file    Gets together: Not on file    Attends religious service: Not on file    Active member of club or organization: Not on file    Attends meetings of clubs or organizations: Not on file    Relationship status: Not on file  . Intimate partner violence    Fear of current or ex partner: Not on file    Emotionally abused: Not on file    Physically abused: Not on file    Forced sexual activity: Not on file  Other Topics Concern  . Not on file  Social History Narrative  . Not on file     Physical Exam  Vital Signs and Nursing Notes reviewed Vitals:   09/13/18 1430 09/13/18 1530  BP: (!) 156/85 (!) 150/90  Pulse: 87   Resp: 20 (!) 22  Temp:    SpO2: 100%     CONSTITUTIONAL:  Chronically ill-appearing, NAD NEURO:  Alert and oriented x 3, decreased strength to the left arm, moderate dysarthria EYES:  eyes equal and reactive ENT/NECK:  no LAD, no JVD CARDIO: Regular rate, well-perfused, normal S1 and S2 PULM:  CTAB  no wheezing or rhonchi GI/GU:  normal bowel sounds, non-distended, non-tender MSK/SPINE:  No gross deformities, no edema SKIN:  no rash, atraumatic PSYCH:  Appropriate speech and behavior  Diagnostic and Interventional Summary    EKG Interpretation  Date/Time:  Monday September 13 2018 12:26:34 EDT Ventricular Rate:  76 PR Interval:    QRS Duration: 92 QT Interval:  406 QTC Calculation: 457 R Axis:   84 Text Interpretation:  Sinus rhythm Borderline right axis deviation Confirmed by Kennis CarinaBero, Jackolyn Geron [phone]([id number]) on 09/13/2018 4:59:39 PM      Labs Reviewed  APTT - Abnormal; Notable for the following components:      Result Value   aPTT 21 (*)    All other components within normal limits  CBC - Abnormal; Notable for the following components:   Hemoglobin 12.1 (*)    HCT 37.9 (*)    All other components within normal limits  COMPREHENSIVE METABOLIC PANEL - Abnormal; Notable for the following components:    Glucose, Bld 194 (*)    Total Protein 6.3 (*)    All other components within normal limits  SARS CORONAVIRUS 2  ETHANOL  PROTIME-INR  DIFFERENTIAL  TROPONIN I  RAPID URINE DRUG SCREEN, HOSP PERFORMED  URINALYSIS, ROUTINE W REFLEX MICROSCOPIC  I-STAT CREATININE, ED    CT ANGIO HEAD W OR WO CONTRAST  Final Result    CT ANGIO NECK W OR WO CONTRAST  Final Result    CT HEAD CODE STROKE WO CONTRAST  Final Result    MR BRAIN WO CONTRAST    (Results Pending)  MR MRA HEAD WO CONTRAST    (Results Pending)  MR MRA NECK W WO CONTRAST    (Results Pending)    Medications  acetaminophen (TYLENOL) tablet 1,000 mg (1,000 mg Oral Not Given 09/13/18 1258)  sodium chloride 0.9 % bolus 1,000 mL (0 mLs Intravenous Stopped 09/13/18 1431)  iohexol (OMNIPAQUE) 350 MG/ML injection 100 mL (100 mLs Intravenous Contrast Given 09/13/18 1219)     Procedures Critical Care Critical Care Documentation Critical care time provided by me (excluding procedures): 32 minutes  Condition necessitating critical care: Concern for acute stroke  Components of critical care management: reviewing of prior records, laboratory and imaging interpretation, frequent re-examination and reassessment of vital signs, initiation of code stroke protocol, discussion with consulting services    ED Course and Medical Decision Making  I have reviewed the triage vital signs and the nursing notes.  Pertinent labs & imaging results that were available during my care of the patient were reviewed by me and considered in my medical decision making (see below for details).  68 year old male recovering from cerebellar stroke last week here with report of dysarthria as well as left arm weakness.  Objective pronator drift on exam.  Code stroke initiated, considering worsening of ischemic stroke versus hemorrhagic conversion, will obtain stat CT head.  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  Unfortunately patient was refusing COVID testing and therefore was not eligible for IR intervention or diagnostics.  He was fully informed of the risks of not undergoing the COVID testing, such  as loss of brain function or death.  Still refused COVID testing.  Neurology recommending MRI and hospitalist admission.  Admitted to hospital service for further care.  Elmer SowMichael M. Pilar PlateBero, MD [facility] [email or web address]  Final Clinical Impressions(s) / ED Diagnoses     ICD-10-CM   1. Acute embolic stroke (HCC)  I63.9 CANCELED: IR ANGIO INTRA EXTRACRAN SEL COM CAROTID INNOMINATE BILAT MOD SED    CANCELED: IR ANGIO INTRA EXTRACRAN SEL COM CAROTID INNOMINATE BILAT MOD SED    ED Discharge Orders    None         Sabas SousBero, Earnstine Meinders M, MD 09/13/18 1701

## 2018-09-13 NOTE — ED Notes (Signed)
Attempted to do the stroke swallow screen and pt is refusing to have same done. Pt is NPO until swallow screen is performed.

## 2018-09-13 NOTE — Patient Outreach (Signed)
[facility]) Care Management  09/13/2018  Kristopher Ruiz Aug 24, 1950 [id number]   EMMI- Stroke not on APL RED ON EMMI ALERT Day # 1 Date: 09/10/2018 Red Alert Reason:  Filled new prescriptions? no  Outreach attempt: No answer.  HIPAA compliant voice message left.   Plan: RN CM will attempt again within 4 business days.  Kristopher Baseman, RN, MSN [facility] Care Management Care Management Coordinator Direct Line [phone] Toll Free: [phone]  Fax: [phone]

## 2018-09-13 NOTE — ED Notes (Signed)
Pt transported to MRI 

## 2018-09-13 NOTE — ED Notes (Signed)
Patient transported to CT 

## 2018-09-13 NOTE — ED Notes (Signed)
ED TO INPATIENT HANDOFF REPORT  ED Nurse Name and Phone #: [phone]  S Name/Age/Gender Kristopher Ruiz 68 y.o. male Room/Bed: 041C/041C  Code Status   Code Status: Prior  Home/SNF/Other Home Patient oriented to: self, place, time and situation Is this baseline? Yes   Triage Complete: Triage complete  Chief Complaint Near Syncope  Triage Note Pt here from home with c/o near syncopal and n/v ,  of zofran given by ems cbg 146   Allergies Allergies  Allergen Reactions  . Ibuprofen Other (See Comments)    GI Problems    Level of Care/Admitting Diagnosis ED Disposition    ED Disposition Condition Comment   Admit  Hospital Area: [facility] [[id number]]  Level of Care: Telemetry Medical [104]  Covid Evaluation: Screening Protocol (No Symptoms)  Diagnosis: TIA (transient ischemic attack) [454098]  Admitting Physician: Pieter Partridge [[id number]]  Attending Physician: Pieter Partridge [[id number]]  Estimated length of stay: past midnight tomorrow  Certification:: I certify this patient will need inpatient services for at least 2 midnights  PT Class (Do Not Modify): Inpatient [101]  PT Acc Code (Do Not Modify): Private [1]       B Medical/Surgery History Past Medical History:  Diagnosis Date  . Hypertension    Past Surgical History:  Procedure Laterality Date  . HIP FRACTURE SURGERY    . LEG AMPUTATION BELOW KNEE       A IV Location/Drains/Wounds Patient Lines/Drains/Airways Status   Active Line/Drains/Airways    Name:   Placement date:   Placement time:   Site:   Days:   Peripheral IV 09/13/18 Right Antecubital   09/13/18    1104    Antecubital   less than 1   Peripheral IV 09/13/18 Right Other (Comment)   09/13/18    1233    Other (Comment)   less than 1          Intake/Output Last 24 hours  Intake/Output Summary (Last 24 hours) at 09/13/2018 1947 Last data filed at 09/13/2018 1431 Gross per 24 hour  Intake 1000 ml  Output  -  Net 1000 ml    Labs/Imaging Results for orders placed or performed during the hospital encounter of 09/13/18 (from the past 48 hour(s))  Ethanol     Status: None   Collection Time: 09/13/18 11:28 AM  Result Value Ref Range   Alcohol, Ethyl (B) <10 <10 mg/dL    Comment: (NOTE) Lowest detectable limit for serum alcohol is 10 mg/dL. For medical purposes only. Performed at [facility], [address] [place], [place] [address]   Protime-INR     Status: None   Collection Time: 09/13/18 11:28 AM  Result Value Ref Range   Prothrombin Time 13.9 11.4 - 15.2 seconds   INR 1.1 0.8 - 1.2    Comment: (NOTE) INR goal varies based on device and disease states. Performed at [facility], [address], [place], [place] [address]   APTT     Status: Abnormal   Collection Time: 09/13/18 11:28 AM  Result Value Ref Range   aPTT 21 (L) 24 - 36 seconds    Comment: Performed at [facility], [address] [place], [place] [address]  CBC     Status: Abnormal   Collection Time: 09/13/18 11:28 AM  Result Value Ref Range   WBC 7.2 4.0 - 10.5 K/uL   RBC 4.49 4.22 - 5.81 MIL/uL   Hemoglobin 12.1 (L) 13.0 -  17.0 g/dL   HCT 16.137.9 (L) 09.639.0 - 04.552.0 %   MCV 84.4 80.0 - 100.0 fL   MCH 26.9 26.0 - 34.0 pg   MCHC 31.9 30.0 - 36.0 g/dL   RDW 40.915.1 81.111.5 - 91.415.5 %   Platelets 187 150 - 400 K/uL   nRBC 0.0 0.0 - 0.2 %    Comment: Performed at [facility], [address] [place], [place] [address]  Differential     Status: None   Collection Time: 09/13/18 11:28 AM  Result Value Ref Range   Neutrophils Relative % 55 %   Neutro Abs 4.0 1.7 - 7.7 K/uL   Lymphocytes Relative 34 %   Lymphs Abs 2.5 0.7 - 4.0 K/uL   Monocytes Relative 8 %   Monocytes Absolute 0.6 0.1 - 1.0 K/uL   Eosinophils Relative 1 %   Eosinophils Absolute 0.1 0.0 - 0.5 K/uL   Basophils Relative 1 %   Basophils Absolute 0.0 0.0 - 0.1 K/uL   Immature Granulocytes 1 %   Abs Immature Granulocytes 0.04 0.00 -  0.07 K/uL    Comment: Performed at [facility], [address] [place], [place] [address]  Comprehensive metabolic panel     Status: Abnormal   Collection Time: 09/13/18 11:28 AM  Result Value Ref Range   Sodium 137 135 - 145 mmol/L   Potassium 3.7 3.5 - 5.1 mmol/L   Chloride 104 98 - 111 mmol/L   CO2 25 22 - 32 mmol/L   Glucose, Bld 194 (H) 70 - 99 mg/dL   BUN 13 8 - 23 mg/dL   Creatinine, Ser 8.650.88 0.61 - 1.24 mg/dL   Calcium 9.0 8.9 - 78.410.3 mg/dL   Total Protein 6.3 (L) 6.5 - 8.1 g/dL   Albumin 3.6 3.5 - 5.0 g/dL   AST 27 15 - 41 U/L   ALT 25 0 - 44 U/L   Alkaline Phosphatase 84 38 - 126 U/L   Total Bilirubin 0.6 0.3 - 1.2 mg/dL   GFR calc non Af Amer >60 >60 mL/min   GFR calc Af Amer >60 >60 mL/min   Anion gap 8 5 - 15    Comment: Performed at [facility], [address] [place], [place] [address]  Troponin I - ONCE - STAT     Status: None   Collection Time: 09/13/18 11:28 AM  Result Value Ref Range   Troponin I <0.03 <0.03 ng/mL    Comment: Performed at [facility], [address] [place], [place] [address]  I-Stat Creatinine, ED (not at [facility])     Status: None   Collection Time: 09/13/18 11:37 AM  Result Value Ref Range   Creatinine, Ser 0.90 0.61 - 1.24 mg/dL  SARS Coronavirus 2     Status: None   Collection Time: 09/13/18  3:37 PM  Result Value Ref Range   SARS Coronavirus 2 NOT DETECTED NOT DETECTED    Comment: (NOTE) SARS-CoV-2 target nucleic acids are NOT DETECTED. The SARS-CoV-2 RNA is generally detectable in upper and lower respiratory specimens during the acute phase of infection.  Negative  results do not preclude SARS-CoV-2 infection, do not rule out co-infections with other pathogens, and should not be used as the sole basis for treatment or other patient management decisions.  Negative results must be combined with clinical observations, patient history, and epidemiological information. The expected result is Not Detected. Fact Sheet  for Patients: http://www.biofiredefense.com/wp-content/uploads/2020/03/BIOFIRE-COVID -19-patients.pdf Fact Sheet for Healthcare  Providers: http://www.biofiredefense.com/wp-content/uploads/2020/03/BIOFIRE-COVID -19-hcp.pdf This test is not yet approved or cleared by the [place] and  has been authorized for detection and/or diagnosis of SARS-CoV-2 by FDA under an Emergency Use Authorization (EUA).  This EUA will remain in effec t (meaning this test can be used) for the duration of  the COVID-19 declaration under Section 564(b)(1) of the Act, 21 U.S.C. section 360bbb-3(b)(1), unless the authorization is terminated or revoked sooner. Performed at [facility], [address], [place], [place] [address]    Ct Angio Head W Or Wo Contrast  Result Date: 09/13/2018 CLINICAL DATA:  Left-sided weakness. Slurred speech. Abnormal head CT. EXAM: CT ANGIOGRAPHY HEAD AND NECK TECHNIQUE: Multidetector CT imaging of the head and neck was performed using the standard protocol during bolus administration of intravenous contrast. Multiplanar CT image reconstructions and MIPs were obtained to evaluate the vascular anatomy. Carotid stenosis measurements (when applicable) are obtained utilizing NASCET criteria, using the distal internal carotid diameter as the denominator. CONTRAST:  OMNIPAQUE IOHEXOL 350 MG/ML SOLN COMPARISON:  CT earlier same day.  CT a 09/06/2018 MRI 09/06/2018 FINDINGS: CTA NECK FINDINGS Aortic arch: Aortic atherosclerosis. Branching pattern is normal. No flow limiting origin stenosis. Right carotid system: No change since the previous study. Atherosclerotic disease at the carotid bifurcation and ICA bulb. Minimal diameter of the proximal ICA is 1.5 mm, consistent with a 70% stenosis. Left carotid system: No change since the previous study. Atherosclerotic disease at the carotid bifurcation and ICA bulb. Minimal diameter of 1.5 mm consistent with a 70% stenosis. Vertebral  arteries: No change on the left. Non dominant vertebral artery widely patent at the origin and through the cervical region, terminating in PICA. Dominant right vertebral artery shows stenosis at its origin estimated at 50%. Scattered foci of plaque in the proximal several cm of vessel with additional serial stenoses. Previously, the vessel did not show flow beyond C6. On today's study, low is present extending up to C3. No flow demonstrated in the upper cervical region. Vessel reconstitutes by cervical collaterals at the C1 level and is patent through the foramen magnum to the basilar. Skeleton: Degenerative spondylosis as shown previously. Other neck: Negative Upper chest: Negative except for emphysema and scarring as shown previously. Review of the MIP images confirms the above findings CTA HEAD FINDINGS Anterior circulation: Both internal carotid arteries are patent through the skull base and siphon regions. Atherosclerotic calcification in the carotid siphon regions but no stenosis greater than 30%. The anterior and middle cerebral vessels are patent without proximal stenosis, aneurysm vascular malformation. Posterior circulation: Left vertebral artery terminates in PICA as seen previously, without visible supply to the basilar. The reconstituted right vertebral arteries patent through the foramen magnum to the basilar. No right PICA is visible. There is a patent right anterior inferior cerebellar artery. Basilar artery is patent without evidence of embolic occlusion. In correlation with the noncontrast CT, it is possible that there is a distal embolus of similar density as the enhanced vessel. No flow is seen in the right superior cerebellar artery on today's study. There is also no flow or stenosis seen within the first 3 mm of the left P1 segment, unchanged since previous study, consistent with some embolus in that location. Venous sinuses: Patent and normal. Anatomic variants: None significant otherwise.  Delayed phase: Not performed. Review of the MIP images confirms the above findings IMPRESSION: Following treatment, there is more flow in the right vertebral artery, now showing flow up to about the  C3 level. There continues to be a segment of vessel at C2 which does not show any flow. Reconstituted flow in the distal right vertebral artery at C1. On today's study, there is no flow in the right superior cerebellar artery, which did show patency on the previous study. There is also an absence of demonstrable flow in the proximal 3 mm of the left P1 segment, which was widely patent previously. These findings are consistent with additional distal emboli or embolic migration since the study of 1 week ago. No acute anterior circulation finding/change. 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 Electronically Signed   By: Paulina FusiMark  Shogry M.D.   On: 09/13/2018 13:00   Ct Angio Neck W Or Wo Contrast  Result Date: 09/13/2018 CLINICAL DATA:  Left-sided weakness. Slurred speech. Abnormal head CT. EXAM: CT ANGIOGRAPHY HEAD AND NECK TECHNIQUE: Multidetector CT imaging of the head and neck was performed using the standard protocol during bolus administration of intravenous contrast. Multiplanar CT image reconstructions and MIPs were obtained to evaluate the vascular anatomy. Carotid stenosis measurements (when applicable) are obtained utilizing NASCET criteria, using the distal internal carotid diameter as the denominator. CONTRAST:  100mL OMNIPAQUE IOHEXOL 350 MG/ML SOLN COMPARISON:  CT earlier same day.  CT a 09/06/2018 MRI 09/06/2018 FINDINGS: CTA NECK FINDINGS Aortic arch: Aortic atherosclerosis. Branching pattern is normal. No flow limiting origin stenosis. Right carotid system: No change since the previous study. Atherosclerotic disease at the carotid bifurcation and ICA bulb. Minimal diameter of the proximal ICA is 1.5 mm, consistent with a 70%  stenosis. Left carotid system: No change since the previous study. Atherosclerotic disease at the carotid bifurcation and ICA bulb. Minimal diameter of 1.5 mm consistent with a 70% stenosis. Vertebral arteries: No change on the left. Non dominant vertebral artery widely patent at the origin and through the cervical region, terminating in PICA. Dominant right vertebral artery shows stenosis at its origin estimated at 50%. Scattered foci of plaque in the proximal several cm of vessel with additional serial stenoses. Previously, the vessel did not show flow beyond C6. On today's study, low is present extending up to C3. No flow demonstrated in the upper cervical region. Vessel reconstitutes by cervical collaterals at the C1 level and is patent through the foramen magnum to the basilar. Skeleton: Degenerative spondylosis as shown previously. Other neck: Negative Upper chest: Negative except for emphysema and scarring as shown previously. Review of the MIP images confirms the above findings CTA HEAD FINDINGS Anterior circulation: Both internal carotid arteries are patent through the skull base and siphon regions. Atherosclerotic calcification in the carotid siphon regions but no stenosis greater than 30%. The anterior and middle cerebral vessels are patent without proximal stenosis, aneurysm vascular malformation. Posterior circulation: Left vertebral artery terminates in PICA as seen previously, without visible supply to the basilar. The reconstituted right vertebral arteries patent through the foramen magnum to the basilar. No right PICA is visible. There is a patent right anterior inferior cerebellar artery. Basilar artery is patent without evidence of embolic occlusion. In correlation with the noncontrast CT, it is possible that there is a distal embolus of similar density as the enhanced vessel. No flow is seen in the right superior cerebellar artery on today's study. There is also no flow or stenosis seen within  the first 3 mm of the left P1 segment, unchanged since previous study, consistent with some embolus in that location. Venous sinuses: Patent and normal. Anatomic variants:  None significant otherwise. Delayed phase: Not performed. Review of the MIP images confirms the above findings IMPRESSION: Following treatment, there is more flow in the right vertebral artery, now showing flow up to about the C3 level. There continues to be a segment of vessel at C2 which does not show any flow. Reconstituted flow in the distal right vertebral artery at C1. On today's study, there is no flow in the right superior cerebellar artery, which did show patency on the previous study. There is also an absence of demonstrable flow in the proximal 3 mm of the left P1 segment, which was widely patent previously. These findings are consistent with additional distal emboli or embolic migration since the study of 1 week ago. No acute anterior circulation finding/change. 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 Electronically Signed   By: Nelson Chimes M.D.   On: 09/13/2018 13:00   Mr Brain Wo Contrast  Result Date: 09/13/2018 CLINICAL DATA:  Stroke. EXAM: MRI HEAD WITHOUT CONTRAST TECHNIQUE: Multiplanar, multiecho pulse sequences of the brain and surrounding structures were obtained without intravenous contrast. COMPARISON:  MRI head 09/06/2018.  CT a head 09/13/2018 FINDINGS: Brain: Compared to the prior MRI of 09/06/2018, there has been extension of right superior cerebellar artery territory infarct. Previously noted right inferior cerebellar infarct is similar to the prior study. No other areas of acute infarct be sides the right cerebellum. No associated hemorrhage. Ventricle size normal. No midline shift. Mild chronic white matter changes bilaterally. No mass lesion. Vascular: Normal arterial flow voids Skull and upper cervical spine: Negative Sinuses/Orbits:  Mucosal edema paranasal sinuses.  Negative orbit Other: None IMPRESSION: Interval extension of right superior cerebellar infarct compared with the MRI of 09/06/2018. Right PICA infarct appears unchanged. No interval hemorrhage. 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 Nonocclusive clot in the left P1 segment is present without evidence of left posterior cerebral artery infarct. Electronically Signed   By: Franchot Gallo M.D.   On: 09/13/2018 19:12   Ct Head Code Stroke Wo Contrast  Result Date: 09/13/2018 CLINICAL DATA:  Code stroke. Left-sided weakness and slurred speech. EXAM: CT HEAD WITHOUT CONTRAST TECHNIQUE: Contiguous axial images were obtained from the base of the skull through the vertex without intravenous contrast. COMPARISON:  Head MRI and CT/CTA 09/06/2018 FINDINGS: Brain: Well-defined low-density in the right cerebellum corresponds to the previously described infarct without evidence of substantial interval enlargement. Edema results in partial effacement of the fourth ventricle without evidence of hydrocephalus. Mild diffuse hypoattenuation of the mid and upper pons is attributed to beam hardening given straight margins on sagittal reformats. No acute supratentorial infarct, intracranial hemorrhage, mass, midline shift, or extra-axial fluid collection is identified. Cerebral white matter hypodensities are unchanged and nonspecific but compatible with mild chronic small vessel ischemic disease. Vascular: Calcified atherosclerosis at the skull base. New focal hyperdensity in the distal basilar artery. Skull: No fracture or focal osseous lesion. Sinuses/Orbits: Mild mucosal thickening in the paranasal sinuses. Clear mastoid air cells. Unremarkable orbits. Other: Partially visualized maxillary dental disease. ASPECTS [facility] Stroke Program Early CT Score) - Ganglionic level infarction (caudate, lentiform nuclei, internal capsule, insula, M1-M3 cortex): 7 - Supraganglionic  infarction (M4-M6 cortex): 3 Total score (0-10 with 10 being normal): 10 IMPRESSION: 1. Subacute right cerebellar infarct. 2. No evidence of acute supratentorial infarct or hemorrhage. ASPECTS is 10. 3. New hyperdense appearance of the distal basilar artery. Angiographic imaging is recommended to evaluate for embolus. These  results were communicated to Dr. Wilford Corner at 11:57 am on 09/13/2018 by text page via the [facility] messaging system. Electronically Signed   By: Sebastian Ache M.D.   On: 09/13/2018 11:58    Pending Labs Unresulted Labs (From admission, onward)    Start     Ordered   09/13/18 1118  Urine rapid drug screen (hosp performed)  ONCE - STAT,   STAT     09/13/18 1117   09/13/18 1118  Urinalysis, Routine w reflex microscopic  ONCE - STAT,   STAT     09/13/18 1117   Signed and Held  Hemoglobin A1c  Tomorrow morning,   R     Signed and Held   Signed and Held  Lipid panel  Tomorrow morning,   R    Comments: Fasting    Signed and Held   Signed and Held  CBC  (enoxaparin (LOVENOX)    CrCl >/= 30 ml/min)  Once,   R    Comments: Baseline for enoxaparin therapy IF NOT ALREADY DRAWN.  Notify MD if PLT < 100 K.    Signed and Held   Signed and Held  Creatinine, serum  (enoxaparin (LOVENOX)    CrCl >/= 30 ml/min)  Once,   R    Comments: Baseline for enoxaparin therapy IF NOT ALREADY DRAWN.    Signed and Held   Signed and Held  Creatinine, serum  (enoxaparin (LOVENOX)    CrCl >/= 30 ml/min)  Weekly,   R    Comments: while on enoxaparin therapy    Signed and Held          Vitals/Pain Today's Vitals   09/13/18 1430 09/13/18 1530 09/13/18 1900 09/13/18 1920  BP: (!) 156/85 (!) 150/90 (!) 164/99   Pulse: 87  78   Resp: 20 (!) 22 16   Temp:      TempSrc:      SpO2: 100%  100%   PainSc: Asleep   9     Isolation Precautions No active isolations  Medications Medications  acetaminophen (TYLENOL) tablet 1,000 mg (1,000 mg Oral Not Given 09/13/18 1258)  sodium chloride 0.9 % bolus 1,000  mL (0 mLs Intravenous Stopped 09/13/18 1431)  iohexol (OMNIPAQUE) 350 MG/ML injection 100 mL (100 mLs Intravenous Contrast Given 09/13/18 1219)  acetaminophen (TYLENOL) tablet 1,000 mg (1,000 mg Oral Given 09/13/18 1923)    Mobility walks with device Low fall risk   Focused Assessments Neuro Assessment Handoff:  Swallow screen pass? Yes    NIH Stroke Scale ( + Modified Stroke Scale Criteria)  Interval: Shift assessment Level of Consciousness (1a.)   : Alert, keenly responsive LOC Questions (1b. )   +: Answers both questions correctly LOC Commands (1c. )   + : Performs both tasks correctly Best Gaze (2. )  +: Normal Visual (3. )  +: No visual loss Facial Palsy (4. )    : Normal symmetrical movements Motor Arm, Left (5a. )   +: No drift Motor Arm, Right (5b. )   +: No drift Motor Leg, Left (6a. )   +: No drift Motor Leg, Right (6b. )   +: No drift Limb Ataxia (7. ): Absent Sensory (8. )   +: Mild-to-moderate sensory loss, patient feels pinprick is less sharp or is dull on the affected side, or there is a loss of superficial pain with pinprick, but patient is aware of being touched Best Language (9. )   +: No aphasia Dysarthria (10. ): Normal  Extinction/Inattention (11.)   +: No Abnormality Modified SS Total  +: 1 Complete NIHSS TOTAL: 1 Last date known well: 09/13/18 Last time known well: 0900 Neuro Assessment: Exceptions to WDL Neuro Checks:   Initial (09/13/18 1140)  Last Documented NIHSS Modified Score: 1 (09/13/18 1927) Has TPA been given? No If patient is a Neuro Trauma and patient is going to OR before floor call report to 4N Charge nurse: [phone] or [phone]     R Recommendations: See Admitting Provider Note  Report given to:   Additional Notes:  Pt has L aka, uses walker to walk.

## 2018-09-13 NOTE — Code Documentation (Signed)
68yo male arriving to [facility] via [place] at 58. Patient from home where he was LKW at 0900 this morning. Patient c/o nausea and vomiting and worsening left sided weakness and slurred speech. 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 Code stroke called in the ED. Stroke team responded to the bedside in CT. CT completed. Patient transported to the ED. Patient with moderate dysarthria on exam. Patient is also noted to be lethargic. Patient is contraindicated for treatment with tPA d/t recent infarct. CT showing subacute right cerebellar infarct and hyperdense distal basilar artery. Patient transported back to CT for CTA head and neck. Decision made to proceed with diagnostic cerebral angiogram. Patient refused to have COVID-19 testing completed despite discussion with neurologist. Cerebral angiogram canceled with plans to proceed with MRI/A. No acute stroke treatment at this time. Bedside handoff with ED RN Nicki Reaper.

## 2018-09-13 NOTE — ED Notes (Signed)
Pt returned from MRI °

## 2018-09-13 NOTE — ED Notes (Addendum)
Pt refusing COVID swab. Pt stated "You're not sticking that god damm thing up my fucking nose!"   Dr. Rory Percy advised he is low risk and to take him over to IR.

## 2018-09-13 NOTE — Consult Note (Addendum)
Neurology Consultation  Reason for Consult: Worsening dysarthria and left arm weakness Referring Physician: Pilar PlateBero  CC: Dysarthria and left arm weakness  History is obtained from: Patient along with chart  HPI: Kristopher Ruiz is a 68 y.o. male with history of right PICA stroke 1 week ago along with hypertension.  Patient initially came to the ED secondary to having?  Syncopal episode.  Code stroke was called.  Upon meeting patient patient did show dysarthria which she initially said was not normal for him however later in the conversation he did state that this was his baseline.  Initially he had complained of left arm weakness however on exam I did not note any and when asked again he stated that he felt as though the weakness had dissipated.  Patient had no other complaints at that time.   Chart review: Patient was recently seen in the hospital for code stroke on 09/06/2018.  Patient was found to have a right cerebellar PICA infarct secondary to large vessel disease source.  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  Some antegrade flow distal right vertebral artery appears to be only supplied by the basilar.  The right PICA was not visualized.  Bilateral ICAs had 70% stenosis at the bifurcations.  2D echo showed an ejection fraction of 60-65% with no embolus source.  At that time LDL was 120 and hemoglobin A1c was 6.2.   LKW: 0900 hrs. this morning tpa given?: no, recent stroke NIH stroke scale of 3  ROS: A 14 point ROS was performed and is negative except as noted in the HPI.  Past Medical History:  Diagnosis Date  . Hypertension      Family History  Problem Relation Age of Onset  . Hypertension Mother   . Hypertension Father     Social History:   reports that he has been smoking cigarettes. He has never used smokeless tobacco. He reports that he does not drink alcohol or use  drugs.  Medications  Current Facility-Administered Medications:  .  acetaminophen (TYLENOL) tablet 1,000 mg, 1,000 mg, Oral, Once, Sabas SousBero, Michael M, MD .  sodium chloride 0.9 % bolus 1,000 mL, 1,000 mL, Intravenous, Once, Sabas SousBero, Michael M, MD  Current Outpatient Medications:  .  acetaminophen (TYLENOL) 500 MG tablet, Take 1,000 mg by mouth every 6 (six) hours as needed for mild pain or headache., Disp: , Rfl:  .  aspirin 81 MG tablet, Take 81 mg by mouth daily., Disp: , Rfl:  .  atorvastatin (LIPITOR) 80 MG tablet, Take 1 tablet (80 mg total) by mouth daily for 30 days., Disp: 30 tablet, Rfl: 11 .  clopidogrel (PLAVIX) 75 MG tablet, Take 1 tablet (75 mg total) by mouth daily for 30 days., Disp: 30 tablet, Rfl: 11 .  diltiazem (TIAZAC) 120 MG 24 hr capsule, Take 1 capsule (120 mg total) by mouth daily., Disp: 30 capsule, Rfl: 2 .  gabapentin (NEURONTIN) 100 MG capsule, Take 1 capsule (100 mg total) by mouth 3 (three) times daily., Disp: 90 capsule, Rfl: 2 .  hydrochlorothiazide (HYDRODIURIL) 25 MG tablet, Take 1 tablet (25 mg total) by mouth daily for 30 days., Disp: 30 tablet, Rfl: 2 .  loratadine (CLARITIN) 10 MG tablet, Take 10 mg by mouth daily., Disp: , Rfl:  .  metoprolol succinate (TOPROL-XL) 50 MG 24 hr tablet, Take 1 tablet (50 mg total) by mouth daily. Take with or  immediately following a meal., Disp: 30 tablet, Rfl: 2 .  nicotine (NICODERM CQ - DOSED IN MG/24 HOURS) 21 mg/24hr patch, Place 1 patch (21 mg total) onto the skin daily for 14 days., Disp: 14 patch, Rfl: 0 .  omeprazole (PRILOSEC) 40 MG capsule, Take 1 capsule (40 mg total) by mouth daily., Disp: 30 capsule, Rfl: 2   Exam: Current vital signs: BP 120/63 (BP Location: Right Arm)   Pulse 68   Temp 97.6 F (36.4 C) (Oral)   Resp 14   SpO2 93%  Vital signs in last 24 hours: Temp:  [97.6 F (36.4 C)] 97.6 F (36.4 C) (06/15 1110) Pulse Rate:  [68] 68 (06/15 1110) Resp:  [14] 14 (06/15 1110) BP: (120)/(63) 120/63  (06/15 1110) SpO2:  [93 %] 93 % (06/15 1110)  Physical Exam  Constitutional: Appears well-developed and well-nourished.  Psych: Affect appropriate to situation Eyes: No scleral injection HENT: No OP obstrucion Head: Normocephalic.  Cardiovascular: Normal rate and regular rhythm.  Respiratory: Effort normal, non-labored breathing GI: Soft.  No distension. There is no tenderness.  Skin: WDI  Neuro: Mental Status: Patient is sleepy,  oriented to person, place, difficult to understand secondary to dysarthria Able to state that he started to have weakness of his left arm and dysarthria at 9:00  Cranial Nerves: II: Visual Fields are full.  III,IV, VI: EOMI without ptosis or diploplia. Pupils-right pupil was pinpoint left pupil was 3 mm, mildly irregular but reactive  to light-looking back at the previous note from code stroke prior the abnormality in the pupils were present there and he stated that that was normal for him V: Facial sensation is symmetric to temperature VII: Left facial droop.  VIII: hearing is intact to voice X: Palat elevates symmetrically XI: Shoulder shrug is symmetric. XII: tongue is midline without atrophy or fasciculations.  Motor: Tone is normal. Bulk is normal. 5/5 strength was present in all four extremities.  Left BKA Sensory: Sensation is symmetric to light touch and temperature in the arms and legs. Deep Tendon Reflexes: 2+ and symmetric in the biceps no patella and no ankle jerk on the right plantars: Toes are downgoing on the right.  Cerebellar: FNF  are intact bilaterally    Labs I have reviewed labs in epic and the results pertinent to this consultation are:   CBC    Component Value Date/Time   WBC 7.2 09/13/2018 1128   RBC 4.49 09/13/2018 1128   HGB 12.1 (L) 09/13/2018 1128   HCT 37.9 (L) 09/13/2018 1128   PLT 187 09/13/2018 1128   MCV 84.4 09/13/2018 1128   MCH 26.9 09/13/2018 1128   MCHC 31.9 09/13/2018 1128   RDW 15.1 09/13/2018  1128   LYMPHSABS 2.5 09/13/2018 1128   MONOABS 0.6 09/13/2018 1128   EOSABS 0.1 09/13/2018 1128   BASOSABS 0.0 09/13/2018 1128    CMP     Component Value Date/Time   NA 138 09/06/2018 0950   K 4.1 09/06/2018 0950   CL 100 09/06/2018 0950   CO2 26 09/06/2018 0950   GLUCOSE 140 (H) 09/06/2018 0950   BUN 12 09/06/2018 0950   CREATININE 0.90 09/13/2018 1137   CALCIUM 9.9 09/06/2018 0950   GFRNONAA >60 09/06/2018 0950   GFRAA >60 09/06/2018 0950    Lipid Panel     Component Value Date/Time   CHOL 188 09/07/2018 0410   TRIG 82 09/07/2018 0410   HDL 52 09/07/2018 0410   CHOLHDL 3.6 09/07/2018 0410  VLDL 16 09/07/2018 0410   LDLCALC 120 (H) 09/07/2018 0410     Imaging I have reviewed the images obtained:  CT-scan of the brain- subacute right cerebellar infarct, no evidence of acute supratentorial infarct or hemorrhage.  New hyperdense appearance of the distal basilar artery  Felicie Mornavid Smith PA-C [facility] [phone]  M-F  (9:00 am- 5:00 PM)  09/13/2018, 12:02 PM   Attending addendum Patient seen and examined his acute code stroke. Patient brought in for decreased level of consciousness and worsening dysarthria. Had recent right PICA stroke. Imaging suggestive of severe intra-and extracranial atherosclerosis. Discharged on dual antiplatelets. On examination, the patient had severe dysarthria as well as some depressed level of consciousness but otherwise nonfocal exam. Due to the low stroke scale, initially decision was made not to obtain vessel imaging is stroke work-up was recently completed but the somnolence was bothersome and concerning for a basilar occlusion given the fact that he had a posterior circulation stroke recently. CTA was performed that showed diseased right vertebral artery, possible new occlusion in the right P1 segment and generalized intracranial atherosclerosis. I had a detailed discussion with the patient about pursuing a diagnostic  cerebral angiogram to which he vehemently refused-and also refused getting swabbed for testing for COVID which is a standard procedure for anybody being admitted to the hospital. Decision was made to monitor him clinically and get an MRI and then reassess.  In the interim, he spoke with his girlfriend who convinced him to get the COVID swab done.  He will still have an MRI done and on exam now he is more awake and less dysarthric-he attributes his presentation to sleep deprivation and feeling tired because of not being able to sleep. Clinically he looks stable and not rushing for any emergent cerebral angiogram but he would benefit from a diagnostic cerebral angiogram if deemed appropriate by the stroke team.  Assessment:  68 year old man with a recent right PICA stroke coming for evaluation of a possible syncopal episode but noted to have waxing and waning level of consciousness as well as worsening dysarthria concerning for posterior circulation stroke. Noncontrast CT with a possible hyperdense basilar which is new from a week ago. CTA of the head and neck with worsening right vertebral artery disease and possible new right P1 segment stenoses/occlusion. Patient initially was vehemently against getting any procedures done and would not even allow to be swabbed for COVID testing which is a standard procedure prior to proceeding for inpatient admission or procedures. Decision was made then to pursue an MRI and reevaluate the situation. While pending for the MRI, his girlfriend was able to speak with him though the phone and convince him to get the swab done. He would get COVID swab and be admitted for stroke team's further evaluation and MRI.  Impression Recent PICA stroke on the right Evaluate for new posterior circulation strokes Evaluate for worsening posterior circulation stenosis. Evaluate for new atheroembolic strokes Evaluate for recrudescence of stroke symptoms versus substance abuse side  effects   Recommendations: Continue dual antiplatelets for now Continue high-dose statin No need to repeat echocardiogram MRI brain without contrast Possible diagnostic cerebral angiogram in the morning based on stroke team evaluation and rounding. Case was discussed with endovascular-Dr. Corliss Skainseveshwar, who had agreed to perform a diagnostic cerebral angiogram but the patient refused the procedure as well as basic admission screening such as COVID testing initially but then later agreed to it. Check infectious work-up-UA, chest x-ray Also check urinary toxicology screen I would  recommend admitting the patient to the medicine hospitalist service.  Stroke neurology will continue to follow with you tomorrow in consultation.  -- Milon DikesAshish Addison Whidbee, MD [facility] Pager: [phone] If 7pm to 7am, please call on call as listed on AMION.   CRITICAL CARE ATTESTATION Performed by: Milon DikesAshish Jojo Pehl, MD Total critical care time: 55 minutes Critical care time was exclusive of separately billable procedures and treating other patients and/or supervising APPs/Residents/Students Critical care was necessary to treat or prevent imminent or life-threatening deterioration due to posterior circulation stroke This patient is critically ill and at significant risk for neurological worsening and/or death and care requires constant monitoring. Critical care was time spent personally by me on the following activities: development of treatment plan with patient and/or surrogate as well as nursing, discussions with consultants, evaluation of patient's response to treatment, examination of patient, obtaining history from patient or surrogate, ordering and performing treatments and interventions, ordering and review of laboratory studies, ordering and review of radiographic studies, pulse oximetry, re-evaluation of patient's condition, participation in multidisciplinary rounds and medical decision making of high  complexity in the care of this patient.

## 2018-09-13 NOTE — ED Notes (Signed)
Dr. Rory Percy spoke to pt advised him that we needed to swab him to get him treated and the pt told Dr. Rory Percy he was not getting same done. Dr. Rory Percy told the pt we could not treat him without the swab and he could possibly have some brain damage due to the lack of this procesure. Even after Dr. Rory Percy spoke to the pt about the consequences he still refused the COVID swab. Dr. Rory Percy advised to just send the pt to MRI for those procedures and to call him when they get resulted.

## 2018-09-13 NOTE — ED Notes (Signed)
Pt still in MRI 

## 2018-09-13 NOTE — ED Triage Notes (Signed)
Pt here from home with c/o near syncopal and n/v , 4mg  of zofran given by [facility] cbg 146

## 2018-09-13 NOTE — ED Notes (Signed)
Pt spoke with significant other and updated her on his care

## 2018-09-14 ENCOUNTER — Other Ambulatory Visit: Payer: Self-pay

## 2018-09-14 DIAGNOSIS — Z720 Tobacco use: Secondary | ICD-10-CM

## 2018-09-14 DIAGNOSIS — I639 Cerebral infarction, unspecified: Secondary | ICD-10-CM

## 2018-09-14 LAB — GLUCOSE, CAPILLARY
Glucose-Capillary: 96 mg/dL (ref 70–99)
Glucose-Capillary: 103 mg/dL — ABNORMAL HIGH (ref 70–99)
Glucose-Capillary: 97 mg/dL (ref 70–99)
Glucose-Capillary: 101 mg/dL — ABNORMAL HIGH (ref 70–99)
Glucose-Capillary: 123 mg/dL — ABNORMAL HIGH (ref 70–99)

## 2018-09-14 LAB — LIPID PANEL
Cholesterol: 159 mg/dL (ref 0–200)
HDL: 55 mg/dL (ref >40)
LDL Cholesterol: 91 mg/dL (ref 0–99)
Total CHOL/HDL Ratio: 2.9 RATIO
Triglycerides: 64 mg/dL (ref <150)
VLDL: 13 mg/dL (ref 0–40)

## 2018-09-14 LAB — HEMOGLOBIN A1C
Hgb A1c MFr Bld: 6.4 % — ABNORMAL HIGH (ref 4.8–5.6)
Mean Plasma Glucose: 136.98 mg/dL

## 2018-09-14 MED ORDER — ASPIRIN EC 325 MG PO TBEC
325.0000 mg | DELAYED_RELEASE_TABLET | Freq: Every day | ORAL | Status: DC
  Administered 2018-09-15: 325 mg via ORAL
  Filled 2018-09-14: qty 1

## 2018-09-14 NOTE — Evaluation (Signed)
Speech Language Pathology Evaluation Patient Details Name: Kristopher Ruiz MRN: [id number] DOB: 1950/06/03 Today's Date: 09/14/2018 Time: 4098-1191 SLP Time Calculation (min) (ACUTE ONLY): 28 min  Problem List:  Patient Active Problem List   Diagnosis Date Noted  . TIA (transient ischemic attack) 09/13/2018  . Cerebellar stroke ([place]) 09/06/2018  . Hypoglycemia due to insulin 09/06/2018  . Tobacco abuse 09/06/2018  . Hyperlipidemia 09/06/2018  . Essential hypertension 09/06/2018  . 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[place]) 04/16/2017   Past Medical History:  Past Medical History:  Diagnosis Date  . Hypertension    Past Surgical History:  Past Surgical History:  Procedure Laterality Date  . HIP FRACTURE SURGERY    . LEG AMPUTATION BELOW KNEE     HPI:  Pt is a 68 y.o. male with past medical history significant for hypertension, hyperlipidemia and history of traumatic BKA who was recently discharged last week after a right cerebellar infarct in the PICA distribution who was in rehab until he became dizzy and started "slobbering and talking slurry". MRI of the brain revealed interval extension of right superior cerebellar infarct compared with the MRI of 09/06/2018.   Assessment / Plan / Recommendation Clinical Impression  Pt is known to speech pathology and participated in a speech/language/cognition evaluation on 09/07/2018 during his last admission. At that time he reported that he was living independently with his girlfriend, has a third-grade educuation, and was employed full-time at a company which [employer]. Pt reported today that he has not returned to work as yet and does not anticipate being able to do so for approximately 2 months. He stated today that he believes his "thinking" has improved since he was last seen by speech pathology and was able to manage his finances following discharge without difficulty. The [facility] Cognitive Assessment 8.2 (MoCA 8.2) was completed  to evaluate the pt's cognitive-linguistic skills. He achieved a score of 18/30 which is improved compared to his score of 12/30 which he achieved on the MoCA 8.1 during the last admission. His score does remain below the normal limits of 26 or more out of 30 and would suggest a mild to moderate impairment but informal assessment revealed functional cognitive-linguistic skills and the pt expressed that he believes his cognition is at baseline. No motor speech or language deficits were noted during the evaluation. Further skilled SLP services are not clinically indicated at this time.     SLP Assessment  SLP Recommendation/Assessment: Patient does not need any further Speech Lanaguage Pathology Services SLP Visit Diagnosis: Cognitive communication deficit (R41.841)    Follow Up Recommendations  None    Frequency and Duration           SLP Evaluation Cognition  Overall Cognitive Status: History of cognitive impairments - at baseline Arousal/Alertness: Awake/alert Orientation Level: Oriented to person;Oriented to place;Oriented to situation(Oriented to date, month, year, not day of week) Attention: Focused;Sustained Focused Attention: Appears intact(Vigilance WNL: 1/1) Sustained Attention: Impaired Sustained Attention Impairment: Verbal complex(Serial 7s: 0/3; pt reported task was impossible to complete) Memory: Impaired Memory Impairment: Retrieval deficit;Decreased recall of new information(Immediate: 4/5; Delayed: 3/5 with cues: 2/2) Awareness: Appears intact Problem Solving: Appears intact Executive Function: Reasoning;Sequencing;Organizing Reasoning: Impaired Reasoning Impairment: Verbal complex(Abstraction: 0/2) Sequencing: Appears intact Organizing: Appears intact       Comprehension  Auditory Comprehension Overall Auditory Comprehension: Appears within functional limits for tasks assessed Yes/No Questions: Within Functional Limits Commands: Impaired Complex Commands: (Trail  completion: 0/1) Conversation: Complex Visual Recognition/Discrimination Discrimination: Within Function Limits  Expression Expression Primary Mode of Expression: Verbal Verbal Expression Overall Verbal Expression: Appears within functional limits for tasks assessed Initiation: No impairment Level of Generative/Spontaneous Verbalization: Conversation Repetition: No impairment(Sentence completion: 2/2) Naming: No impairment Confrontation: Within functional limits(3/3) Divergent: (0/1) Pragmatics: No impairment   Oral / Motor  Oral Motor/Sensory Function Overall Oral Motor/Sensory Function: Within functional limits Motor Speech Overall Motor Speech: Appears within functional limits for tasks assessed Respiration: Within functional limits Phonation: Normal Resonance: Within functional limits Articulation: Within functional limitis Intelligibility: Intelligible Motor Planning: Witnin functional limits Motor Speech Errors: Not applicable   Yann Biehn I. Vear ClockPhillips, MS, CCC-SLP Acute Rehabilitation Services Office number [phone] Pager [phone]                     Scheryl MartenShanika I Shaheen Star 09/14/2018, 1:40 PM

## 2018-09-14 NOTE — Progress Notes (Signed)
Progress Note    Kristopher AquasDavid Postel  ZOX:[id number] DOB: 1951-03-04  DOA: 09/13/2018 PCP: Patient, No Pcp Per    Brief Narrative:    Medical records reviewed and are as summarized below:  Kristopher Ruiz is an 68 y.o. male with recent right posterior circulation stroke and now with an episode of dysarthria and altered consciousness.  Noncontrast CT showed a possible hyperdense basilar lesion which is new from a week ago.  CT angiogram today also showed a possible new right P1 segment stenosis/occlusion.  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  Assessment/Plan:   Active Problems:   Cerebellar stroke (HCC)   Hyperlipidemia   Essential hypertension  CVA MRI: Interval extension of right superior cerebellar infarct compared with the MRI of 09/06/2018. Right PICA infarct appears unchanged. No interval hemorrhage. 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 Nonocclusive clot in the left P1 segment is present without evidence of left posterior cerebral artery infarct. Per neurology: Continue dual antiplatelets for now Continue high-dose statin No need to repeat echocardiogram Possible diagnostic cerebral angiogram in the morning based on stroke team evaluation and rounding. Case was discussed with endovascular-Dr. Corliss Skainseveshwar, who had agreed to perform a diagnostic cerebral angiogram but the patient refused the procedure   HYPERGLYCEMIA HgbA1c: 6.4 -SSI -needs dietary changes for home  HTN -permissive hypertension   HLD Continue atorvastatin 80 mg per home doses  LDL: 91  TOBACCO ABUSE -encouraged cessation  Family Communication/Anticipated D/C date and plan/Code Status   DVT prophylaxis: Lovenox ordered. Code Status: Full Code.  Family Communication:  Disposition Plan: pending work up   Medical Consultants:    Neurology     Subjective:   Says he is having trouble with his vision  Objective:    Vitals:   09/13/18 2335  09/14/18 0334 09/14/18 0740 09/14/18 1114  BP: (!) 173/97 (!) 164/97 (!) 175/95 (!) 176/96  Pulse: (!) 113 (!) 111 (!) 118 (!) 116  Resp:  18 16 20   Temp:  98.6 F (37 C) 98.3 F (36.8 C)   TempSrc:  Oral Oral   SpO2: 98% 100% 100% 100%  Weight:      Height:        Intake/Output Summary (Last 24 hours) at 09/14/2018 1205 Last data filed at 09/14/2018 1036 Gross per 24 hour  Intake 1000 ml  Output 1400 ml  Net -400 ml   Filed Weights   09/13/18 2032  Weight: 55.6 kg    Exam: Alert and oriented, NAD rrr No increased work of breathing Pupils abnormal Left BKA  Data Reviewed:   I have personally reviewed following labs and imaging studies:  Labs: Labs show the following:   Basic Metabolic Panel: Recent Labs  Lab 09/13/18 1128 09/13/18 1137 09/13/18 2105  NA 137  --   --   K 3.7  --   --   CL 104  --   --   CO2 25  --   --   GLUCOSE 194*  --   --   BUN 13  --   --   CREATININE 0.88 0.90 0.69  CALCIUM 9.0  --   --    GFR Estimated Creatinine Clearance: 69.5 mL/min (by C-G formula based on SCr of 0.69 mg/dL). Liver Function Tests: Recent Labs  Lab 09/13/18 1128  AST 27  ALT 25  ALKPHOS 84  BILITOT 0.6  PROT 6.3*  ALBUMIN 3.6   No results for input(s): LIPASE, AMYLASE in the last  168 hours. No results for input(s): AMMONIA in the last 168 hours. Coagulation profile Recent Labs  Lab 09/13/18 1128  INR 1.1    CBC: Recent Labs  Lab 09/13/18 1128 09/13/18 2105  WBC 7.2 7.9  NEUTROABS 4.0  --   HGB 12.1* 13.1  HCT 37.9* 39.7  MCV 84.4 83.1  PLT 187 199   Cardiac Enzymes: Recent Labs  Lab 09/13/18 1128  TROPONINI <0.03   BNP (last 3 results) No results for input(s): PROBNP in the last 8760 hours. CBG: Recent Labs  Lab 09/08/18 0000 09/13/18 2138 09/14/18 0627 09/14/18 1021 09/14/18 1112  GLUCAP 92 118* 96 103* 97   D-Dimer: No results for input(s): DDIMER in the last 72 hours. Hgb A1c: Recent Labs    09/14/18 0351  HGBA1C  6.4*   Lipid Profile: Recent Labs    09/14/18 0351  CHOL 159  HDL 55  LDLCALC 91  TRIG 64  CHOLHDL 2.9   Thyroid function studies: No results for input(s): TSH, T4TOTAL, T3FREE, THYROIDAB in the last 72 hours.  Invalid input(s): FREET3 Anemia work up: No results for input(s): VITAMINB12, FOLATE, FERRITIN, TIBC, IRON, RETICCTPCT in the last 72 hours. Sepsis Labs: Recent Labs  Lab 09/13/18 1128 09/13/18 2105  WBC 7.2 7.9    Microbiology Recent Results (from the past 240 hour(s))  SARS Coronavirus 2 (CEPHEID - Performed in [place] hospital lab), Hosp Order     Status: None   Collection Time: 09/06/18  3:08 PM   Specimen: Nasopharyngeal Swab  Result Value Ref Range Status   SARS Coronavirus 2 NEGATIVE NEGATIVE Final    Comment: (NOTE) If result is NEGATIVE SARS-CoV-2 target nucleic acids are NOT DETECTED. The SARS-CoV-2 RNA is generally detectable in upper and lower  respiratory specimens during the acute phase of infection. The lowest  concentration of SARS-CoV-2 viral copies this assay can detect is 250  copies / mL. A negative result does not preclude SARS-CoV-2 infection  and should not be used as the sole basis for treatment or other  patient management decisions.  A negative result may occur with  improper specimen collection / handling, submission of specimen other  than nasopharyngeal swab, presence of viral mutation(s) within the  areas targeted by this assay, and inadequate number of viral copies  (<250 copies / mL). A negative result must be combined with clinical  observations, patient history, and epidemiological information. If result is POSITIVE SARS-CoV-2 target nucleic acids are DETECTED. The SARS-CoV-2 RNA is generally detectable in upper and lower  respiratory specimens dur ing the acute phase of infection.  Positive  results are indicative of active infection with SARS-CoV-2.  Clinical  correlation with patient history and other diagnostic  information is  necessary to determine patient infection status.  Positive results do  not rule out bacterial infection or co-infection with other viruses. If result is PRESUMPTIVE POSTIVE SARS-CoV-2 nucleic acids MAY BE PRESENT.   A presumptive positive result was obtained on the submitted specimen  and confirmed on repeat testing.  While 2019 novel coronavirus  (SARS-CoV-2) nucleic acids may be present in the submitted sample  additional confirmatory testing may be necessary for epidemiological  and / or clinical management purposes  to differentiate between  SARS-CoV-2 and other Sarbecovirus currently known to infect humans.  If clinically indicated additional testing with an alternate test  methodology [id number]) is advised. The SARS-CoV-2 RNA is generally  detectable in upper and lower respiratory sp ecimens during the acute  phase of  infection. The expected result is Negative. Fact Sheet for Patients:  BoilerBrush.com.cy Fact Sheet for Healthcare Providers: https://pope.com/ This test is not yet approved or cleared by the [place] FDA and has been authorized for detection and/or diagnosis of SARS-CoV-2 by FDA under an Emergency Use Authorization (EUA).  This EUA will remain in effect (meaning this test can be used) for the duration of the COVID-19 declaration under Section 564(b)(1) of the Act, 21 U.S.C. section 360bbb-3(b)(1), unless the authorization is terminated or revoked sooner. Performed at [facility], [address], [place], [place] [address]   SARS Coronavirus 2     Status: None   Collection Time: 09/13/18  3:37 PM  Result Value Ref Range Status   SARS Coronavirus 2 NOT DETECTED NOT DETECTED Final    Comment: (NOTE) SARS-CoV-2 target nucleic acids are NOT DETECTED. The SARS-CoV-2 RNA is generally detectable in upper and lower respiratory specimens during the acute phase of infection.  Negative  results do  not preclude SARS-CoV-2 infection, do not rule out co-infections with other pathogens, and should not be used as the sole basis for treatment or other patient management decisions.  Negative results must be combined with clinical observations, patient history, and epidemiological information. The expected result is Not Detected. Fact Sheet for Patients: http://www.biofiredefense.com/wp-content/uploads/2020/03/BIOFIRE-COVID -19-patients.pdf Fact Sheet for Healthcare Providers: http://www.biofiredefense.com/wp-content/uploads/2020/03/BIOFIRE-COVID -19-hcp.pdf This test is not yet approved or cleared by the [place] and  has been authorized for detection and/or diagnosis of SARS-CoV-2 by FDA under an Emergency Use Authorization (EUA).  This EUA will remain in effec t (meaning this test can be used) for the duration of  the COVID-19 declaration under Section 564(b)(1) of the Act, 21 U.S.C. section 360bbb-3(b)(1), unless the authorization is terminated or revoked sooner. Performed at [facility] [place] [facility], [address] [place], [place] [address]     Procedures and diagnostic studies:  Ct Angio Head W Or Wo Contrast  Result Date: 09/13/2018 CLINICAL DATA:  Left-sided weakness. Slurred speech. Abnormal head CT. EXAM: CT ANGIOGRAPHY HEAD AND NECK TECHNIQUE: Multidetector CT imaging of the head and neck was performed using the standard protocol during bolus administration of intravenous contrast. Multiplanar CT image reconstructions and MIPs were obtained to evaluate the vascular anatomy. Carotid stenosis measurements (when applicable) are obtained utilizing NASCET criteria, using the distal internal carotid diameter as the denominator. CONTRAST:  OMNIPAQUE IOHEXOL 350 MG/ML SOLN COMPARISON:  CT earlier same day.  CT a 09/06/2018 MRI 09/06/2018 FINDINGS: CTA NECK FINDINGS Aortic arch: Aortic atherosclerosis. Branching pattern is normal. No flow limiting origin stenosis. Right  carotid system: No change since the previous study. Atherosclerotic disease at the carotid bifurcation and ICA bulb. Minimal diameter of the proximal ICA is 1.5 mm, consistent with a 70% stenosis. Left carotid system: No change since the previous study. Atherosclerotic disease at the carotid bifurcation and ICA bulb. Minimal diameter of 1.5 mm consistent with a 70% stenosis. Vertebral arteries: No change on the left. Non dominant vertebral artery widely patent at the origin and through the cervical region, terminating in PICA. Dominant right vertebral artery shows stenosis at its origin estimated at 50%. Scattered foci of plaque in the proximal several cm of vessel with additional serial stenoses. Previously, the vessel did not show flow beyond C6. On today's study, low is present extending up to C3. No flow demonstrated in the upper cervical region. Vessel reconstitutes by cervical collaterals at the C1 level and is patent through the foramen magnum to the  basilar. Skeleton: Degenerative spondylosis as shown previously. Other neck: Negative Upper chest: Negative except for emphysema and scarring as shown previously. Review of the MIP images confirms the above findings CTA HEAD FINDINGS Anterior circulation: Both internal carotid arteries are patent through the skull base and siphon regions. Atherosclerotic calcification in the carotid siphon regions but no stenosis greater than 30%. The anterior and middle cerebral vessels are patent without proximal stenosis, aneurysm vascular malformation. Posterior circulation: Left vertebral artery terminates in PICA as seen previously, without visible supply to the basilar. The reconstituted right vertebral arteries patent through the foramen magnum to the basilar. No right PICA is visible. There is a patent right anterior inferior cerebellar artery. Basilar artery is patent without evidence of embolic occlusion. In correlation with the noncontrast CT, it is possible that  there is a distal embolus of similar density as the enhanced vessel. No flow is seen in the right superior cerebellar artery on today's study. There is also no flow or stenosis seen within the first 3 mm of the left P1 segment, unchanged since previous study, consistent with some embolus in that location. Venous sinuses: Patent and normal. Anatomic variants: None significant otherwise. Delayed phase: Not performed. Review of the MIP images confirms the above findings IMPRESSION: Following treatment, there is more flow in the right vertebral artery, now showing flow up to about the C3 level. There continues to be a segment of vessel at C2 which does not show any flow. Reconstituted flow in the distal right vertebral artery at C1. On today's study, there is no flow in the right superior cerebellar artery, which did show patency on the previous study. There is also an absence of demonstrable flow in the proximal 3 mm of the left P1 segment, which was widely patent previously. These findings are consistent with additional distal emboli or embolic migration since the study of 1 week ago. No acute anterior circulation finding/change. 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 Electronically Signed   By: Paulina FusiMark  Shogry M.D.   On: 09/13/2018 13:00   Ct Angio Neck W Or Wo Contrast  Result Date: 09/13/2018 CLINICAL DATA:  Left-sided weakness. Slurred speech. Abnormal head CT. EXAM: CT ANGIOGRAPHY HEAD AND NECK TECHNIQUE: Multidetector CT imaging of the head and neck was performed using the standard protocol during bolus administration of intravenous contrast. Multiplanar CT image reconstructions and MIPs were obtained to evaluate the vascular anatomy. Carotid stenosis measurements (when applicable) are obtained utilizing NASCET criteria, using the distal internal carotid diameter as the denominator. CONTRAST:  100mL OMNIPAQUE IOHEXOL 350 MG/ML SOLN  COMPARISON:  CT earlier same day.  CT a 09/06/2018 MRI 09/06/2018 FINDINGS: CTA NECK FINDINGS Aortic arch: Aortic atherosclerosis. Branching pattern is normal. No flow limiting origin stenosis. Right carotid system: No change since the previous study. Atherosclerotic disease at the carotid bifurcation and ICA bulb. Minimal diameter of the proximal ICA is 1.5 mm, consistent with a 70% stenosis. Left carotid system: No change since the previous study. Atherosclerotic disease at the carotid bifurcation and ICA bulb. Minimal diameter of 1.5 mm consistent with a 70% stenosis. Vertebral arteries: No change on the left. Non dominant vertebral artery widely patent at the origin and through the cervical region, terminating in PICA. Dominant right vertebral artery shows stenosis at its origin estimated at 50%. Scattered foci of plaque in the proximal several cm of vessel with additional serial stenoses. Previously, the vessel did not show flow beyond C6. On  today's study, low is present extending up to C3. No flow demonstrated in the upper cervical region. Vessel reconstitutes by cervical collaterals at the C1 level and is patent through the foramen magnum to the basilar. Skeleton: Degenerative spondylosis as shown previously. Other neck: Negative Upper chest: Negative except for emphysema and scarring as shown previously. Review of the MIP images confirms the above findings CTA HEAD FINDINGS Anterior circulation: Both internal carotid arteries are patent through the skull base and siphon regions. Atherosclerotic calcification in the carotid siphon regions but no stenosis greater than 30%. The anterior and middle cerebral vessels are patent without proximal stenosis, aneurysm vascular malformation. Posterior circulation: Left vertebral artery terminates in PICA as seen previously, without visible supply to the basilar. The reconstituted right vertebral arteries patent through the foramen magnum to the basilar. No right PICA  is visible. There is a patent right anterior inferior cerebellar artery. Basilar artery is patent without evidence of embolic occlusion. In correlation with the noncontrast CT, it is possible that there is a distal embolus of similar density as the enhanced vessel. No flow is seen in the right superior cerebellar artery on today's study. There is also no flow or stenosis seen within the first 3 mm of the left P1 segment, unchanged since previous study, consistent with some embolus in that location. Venous sinuses: Patent and normal. Anatomic variants: None significant otherwise. Delayed phase: Not performed. Review of the MIP images confirms the above findings IMPRESSION: Following treatment, there is more flow in the right vertebral artery, now showing flow up to about the C3 level. There continues to be a segment of vessel at C2 which does not show any flow. Reconstituted flow in the distal right vertebral artery at C1. On today's study, there is no flow in the right superior cerebellar artery, which did show patency on the previous study. There is also an absence of demonstrable flow in the proximal 3 mm of the left P1 segment, which was widely patent previously. These findings are consistent with additional distal emboli or embolic migration since the study of 1 week ago. No acute anterior circulation finding/change. 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 Electronically Signed   By: Paulina Fusi M.D.   On: 09/13/2018 13:00   Mr Brain Wo Contrast  Result Date: 09/13/2018 CLINICAL DATA:  Stroke. EXAM: MRI HEAD WITHOUT CONTRAST TECHNIQUE: Multiplanar, multiecho pulse sequences of the brain and surrounding structures were obtained without intravenous contrast. COMPARISON:  MRI head 09/06/2018.  CT a head 09/13/2018 FINDINGS: Brain: Compared to the prior MRI of 09/06/2018, there has been extension of right superior cerebellar artery  territory infarct. Previously noted right inferior cerebellar infarct is similar to the prior study. No other areas of acute infarct be sides the right cerebellum. No associated hemorrhage. Ventricle size normal. No midline shift. Mild chronic white matter changes bilaterally. No mass lesion. Vascular: Normal arterial flow voids Skull and upper cervical spine: Negative Sinuses/Orbits: Mucosal edema paranasal sinuses.  Negative orbit Other: None IMPRESSION: Interval extension of right superior cerebellar infarct compared with the MRI of 09/06/2018. Right PICA infarct appears unchanged. No interval hemorrhage. 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 Nonocclusive clot in the left P1 segment is present without evidence of left posterior cerebral artery infarct. Electronically Signed   By: Marlan Palau M.D.   On: 09/13/2018 19:12   Ct Head Code Stroke Wo Contrast  Result Date: 09/13/2018 CLINICAL DATA:  Code stroke. Left-sided weakness and slurred speech. EXAM: CT HEAD WITHOUT CONTRAST TECHNIQUE: Contiguous axial images were obtained from the base of the skull through the vertex without intravenous contrast. COMPARISON:  Head MRI and CT/CTA 09/06/2018 FINDINGS: Brain: Well-defined low-density in the right cerebellum corresponds to the previously described infarct without evidence of substantial interval enlargement. Edema results in partial effacement of the fourth ventricle without evidence of hydrocephalus. Mild diffuse hypoattenuation of the mid and upper pons is attributed to beam hardening given straight margins on sagittal reformats. No acute supratentorial infarct, intracranial hemorrhage, mass, midline shift, or extra-axial fluid collection is identified. Cerebral white matter hypodensities are unchanged and nonspecific but compatible with mild chronic small vessel ischemic disease. Vascular: Calcified atherosclerosis at the skull base. New focal hyperdensity in the distal  basilar artery. Skull: No fracture or focal osseous lesion. Sinuses/Orbits: Mild mucosal thickening in the paranasal sinuses. Clear mastoid air cells. Unremarkable orbits. Other: Partially visualized maxillary dental disease. ASPECTS [facility]([place] Stroke Program Early CT Score) - Ganglionic level infarction (caudate, lentiform nuclei, internal capsule, insula, M1-M3 cortex): 7 - Supraganglionic infarction (M4-M6 cortex): 3 Total score (0-10 with 10 being normal): 10 IMPRESSION: 1. Subacute right cerebellar infarct. 2. No evidence of acute supratentorial infarct or hemorrhage. ASPECTS is 10. 3. New hyperdense appearance of the distal basilar artery. Angiographic imaging is recommended to evaluate for embolus. These results were communicated to Dr. Wilford CornerArora at 11:57 am on 09/13/2018 by text page via the [facility] messaging system. Electronically Signed   By: Sebastian AcheAllen  Grady M.D.   On: 09/13/2018 11:58    Medications:    acetaminophen  1,000 mg Oral Once   aspirin EC  81 mg Oral Daily   atorvastatin  80 mg Oral QHS   clopidogrel  75 mg Oral Daily   enoxaparin (LOVENOX) injection  40 mg Subcutaneous Q24H   gabapentin  100 mg Oral TID   insulin aspart  0-9 Units Subcutaneous TID WC   nicotine  21 mg Transdermal QHS   pantoprazole  40 mg Oral Daily   Continuous Infusions:   LOS: 1 day   Joseph ArtJessica U Lycia Sachdeva  [facility]   How to contact the [facility] Attending or Consulting provider 7A - 7P or covering provider during after hours 7P -7A, for this patient?  1. Check the care team in [facility] and look for a) attending/consulting [facility] provider listed and b) the [facility] team listed 2. Log into [email or web address] and use [place]'s universal password to access. If you do not have the password, please contact the hospital operator. 3. Locate the [facility] provider you are looking for under [facility] and page to a number that you can be directly reached. 4. If you still have difficulty reaching the provider, please page  the [facility] (Director on Call) for the Hospitalists listed on amion for assistance.  09/14/2018, 12:05 PM

## 2018-09-14 NOTE — Progress Notes (Signed)
SLP Cancellation Note  Patient Details Name: Kristopher Ruiz MRN: [id number] DOB: [birth year]   Cancelled treatment:       Reason Eval/Treat Not Completed: Fatigue/lethargy limiting ability to participate(Pt was approached for treatment but was unable to demonstrate an adequate level of alertness to participate in the evaluation. SLP will follow up)  Illeana Edick I. Hardin Negus, [place], [place] Office number [phone] Pager [place] 09/14/2018, 10:01 AM

## 2018-09-14 NOTE — Patient Outreach (Signed)
[name] Care Management  09/14/2018  Kristopher Ruiz 1950-08-01 [id number]   EMMI- Stroke not on APL RED ON EMMI ALERT Day # 1 Date:09/10/2018 Red Alert Reason: Filled new prescriptions? no  Outreach attempt: No answer.  HIPAA compliant voice message left. After call patient noted to be re-hospitalized.    Plan: RN CM will have EMMI calls discontinued at this time.  Jone Baseman, RN, MSN [place] [facility] Care Management Coordinator Direct Line [phone] Cell [phone] Toll Free: [phone]  Fax: [phone]

## 2018-09-14 NOTE — Progress Notes (Signed)
STROKE TEAM PROGRESS NOTE   INTERVAL HISTORY Patient sitting in bed, having lunch.  Stated that he has been doing well, no complaints.  Still has right arm ataxia.  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  Currently on DAPT with aspirin 325.  Vitals:   09/13/18 2335 09/14/18 0334 09/14/18 0740 09/14/18 1114  BP: (!) 173/97 (!) 164/97 (!) 175/95 (!) 176/96  Pulse: (!) 113 (!) 111 (!) 118 (!) 116  Resp:  18 16 20   Temp:  98.6 F (37 C) 98.3 F (36.8 C)   TempSrc:  Oral Oral   SpO2: 98% 100% 100% 100%  Weight:      Height:        CBC:  Recent Labs  Lab 09/13/18 1128 09/13/18 2105  WBC 7.2 7.9  NEUTROABS 4.0  --   HGB 12.1* 13.1  HCT 37.9* 39.7  MCV 84.4 83.1  PLT 187 478    Basic Metabolic Panel:  Recent Labs  Lab 09/13/18 1128 09/13/18 1137 09/13/18 2105  NA 137  --   --   K 3.7  --   --   CL 104  --   --   CO2 25  --   --   GLUCOSE 194*  --   --   BUN 13  --   --   CREATININE 0.88 0.90 0.69  CALCIUM 9.0  --   --    Lipid Panel:     Component Value Date/Time   CHOL 159 09/14/2018 0351   TRIG 64 09/14/2018 0351   HDL 55 09/14/2018 0351   CHOLHDL 2.9 09/14/2018 0351   VLDL 13 09/14/2018 0351   LDLCALC 91 09/14/2018 0351   HgbA1c:  Lab Results  Component Value Date   HGBA1C 6.4 (H) 09/14/2018   Urine Drug Screen:     Component Value Date/Time   LABOPIA NONE DETECTED 09/13/2018 2020   COCAINSCRNUR NONE DETECTED 09/13/2018 2020   LABBENZ NONE DETECTED 09/13/2018 2020   AMPHETMU NONE DETECTED 09/13/2018 2020   THCU POSITIVE (A) 09/13/2018 2020   LABBARB NONE DETECTED 09/13/2018 2020    Alcohol Level     Component Value Date/Time   ETH <10 09/13/2018 1128    IMAGING Ct Angio Head W Or Wo Contrast  Result Date: 09/13/2018 CLINICAL DATA:  Left-sided weakness. Slurred speech. Abnormal head CT. EXAM: CT ANGIOGRAPHY HEAD AND NECK TECHNIQUE: Multidetector CT imaging of the head and neck was performed using the standard protocol during bolus  administration of intravenous contrast. Multiplanar CT image reconstructions and MIPs were obtained to evaluate the vascular anatomy. Carotid stenosis measurements (when applicable) are obtained utilizing NASCET criteria, using the distal internal carotid diameter as the denominator. CONTRAST:  132mL OMNIPAQUE IOHEXOL 350 MG/ML SOLN COMPARISON:  CT earlier same day.  CT a 09/06/2018 MRI 09/06/2018 FINDINGS: CTA NECK FINDINGS Aortic arch: Aortic atherosclerosis. Branching pattern is normal. No flow limiting origin stenosis. Right carotid system: No change since the previous study. Atherosclerotic disease at the carotid bifurcation and ICA bulb. Minimal diameter of the proximal ICA is 1.5 mm, consistent with a 70% stenosis. Left carotid system: No change since the previous study. Atherosclerotic disease at the carotid bifurcation and ICA bulb. Minimal diameter of 1.5 mm consistent with a 70% stenosis. Vertebral arteries: No change on the left. Non dominant vertebral artery widely patent at the origin and through the cervical region, terminating in PICA. Dominant right vertebral artery shows stenosis at its origin estimated at 50%. Scattered foci of plaque  in the proximal several cm of vessel with additional serial stenoses. Previously, the vessel did not show flow beyond C6. On today's study, low is present extending up to C3. No flow demonstrated in the upper cervical region. Vessel reconstitutes by cervical collaterals at the C1 level and is patent through the foramen magnum to the basilar. Skeleton: Degenerative spondylosis as shown previously. Other neck: Negative Upper chest: Negative except for emphysema and scarring as shown previously. Review of the MIP images confirms the above findings CTA HEAD FINDINGS Anterior circulation: Both internal carotid arteries are patent through the skull base and siphon regions. Atherosclerotic calcification in the carotid siphon regions but no stenosis greater than 30%. The  anterior and middle cerebral vessels are patent without proximal stenosis, aneurysm vascular malformation. Posterior circulation: Left vertebral artery terminates in PICA as seen previously, without visible supply to the basilar. The reconstituted right vertebral arteries patent through the foramen magnum to the basilar. No right PICA is visible. There is a patent right anterior inferior cerebellar artery. Basilar artery is patent without evidence of embolic occlusion. In correlation with the noncontrast CT, it is possible that there is a distal embolus of similar density as the enhanced vessel. No flow is seen in the right superior cerebellar artery on today's study. There is also no flow or stenosis seen within the first 3 mm of the left P1 segment, unchanged since previous study, consistent with some embolus in that location. Venous sinuses: Patent and normal. Anatomic variants: None significant otherwise. Delayed phase: Not performed. Review of the MIP images confirms the above findings IMPRESSION: Following treatment, there is more flow in the right vertebral artery, now showing flow up to about the C3 level. There continues to be a segment of vessel at C2 which does not show any flow. Reconstituted flow in the distal right vertebral artery at C1. On today's study, there is no flow in the right superior cerebellar artery, which did show patency on the previous study. There is also an absence of demonstrable flow in the proximal 3 mm of the left P1 segment, which was widely patent previously. These findings are consistent with additional distal emboli or embolic migration since the study of 1 week ago. No acute anterior circulation finding/change. 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 Electronically Signed   By: Paulina Fusi M.D.   On: 09/13/2018 13:00   Ct Angio Neck W Or Wo Contrast  Result Date: 09/13/2018 CLINICAL DATA:   Left-sided weakness. Slurred speech. Abnormal head CT. EXAM: CT ANGIOGRAPHY HEAD AND NECK TECHNIQUE: Multidetector CT imaging of the head and neck was performed using the standard protocol during bolus administration of intravenous contrast. Multiplanar CT image reconstructions and MIPs were obtained to evaluate the vascular anatomy. Carotid stenosis measurements (when applicable) are obtained utilizing NASCET criteria, using the distal internal carotid diameter as the denominator. CONTRAST:  OMNIPAQUE IOHEXOL 350 MG/ML SOLN COMPARISON:  CT earlier same day.  CT a 09/06/2018 MRI 09/06/2018 FINDINGS: CTA NECK FINDINGS Aortic arch: Aortic atherosclerosis. Branching pattern is normal. No flow limiting origin stenosis. Right carotid system: No change since the previous study. Atherosclerotic disease at the carotid bifurcation and ICA bulb. Minimal diameter of the proximal ICA is 1.5 mm, consistent with a 70% stenosis. Left carotid system: No change since the previous study. Atherosclerotic disease at the carotid bifurcation and ICA bulb. Minimal diameter of 1.5 mm consistent with a 70% stenosis. Vertebral arteries: No change on  the left. Non dominant vertebral artery widely patent at the origin and through the cervical region, terminating in PICA. Dominant right vertebral artery shows stenosis at its origin estimated at 50%. Scattered foci of plaque in the proximal several cm of vessel with additional serial stenoses. Previously, the vessel did not show flow beyond C6. On today's study, low is present extending up to C3. No flow demonstrated in the upper cervical region. Vessel reconstitutes by cervical collaterals at the C1 level and is patent through the foramen magnum to the basilar. Skeleton: Degenerative spondylosis as shown previously. Other neck: Negative Upper chest: Negative except for emphysema and scarring as shown previously. Review of the MIP images confirms the above findings CTA HEAD FINDINGS  Anterior circulation: Both internal carotid arteries are patent through the skull base and siphon regions. Atherosclerotic calcification in the carotid siphon regions but no stenosis greater than 30%. The anterior and middle cerebral vessels are patent without proximal stenosis, aneurysm vascular malformation. Posterior circulation: Left vertebral artery terminates in PICA as seen previously, without visible supply to the basilar. The reconstituted right vertebral arteries patent through the foramen magnum to the basilar. No right PICA is visible. There is a patent right anterior inferior cerebellar artery. Basilar artery is patent without evidence of embolic occlusion. In correlation with the noncontrast CT, it is possible that there is a distal embolus of similar density as the enhanced vessel. No flow is seen in the right superior cerebellar artery on today's study. There is also no flow or stenosis seen within the first 3 mm of the left P1 segment, unchanged since previous study, consistent with some embolus in that location. Venous sinuses: Patent and normal. Anatomic variants: None significant otherwise. Delayed phase: Not performed. Review of the MIP images confirms the above findings IMPRESSION: Following treatment, there is more flow in the right vertebral artery, now showing flow up to about the C3 level. There continues to be a segment of vessel at C2 which does not show any flow. Reconstituted flow in the distal right vertebral artery at C1. On today's study, there is no flow in the right superior cerebellar artery, which did show patency on the previous study. There is also an absence of demonstrable flow in the proximal 3 mm of the left P1 segment, which was widely patent previously. These findings are consistent with additional distal emboli or embolic migration since the study of 1 week ago. No acute anterior circulation finding/change. 70% stenoses of both proximal internal carotid arteries without  evidence of acute intracranial embolic occlusion. Electronically Signed   By: Paulina FusiMark  Shogry M.D.   On: 09/13/2018 13:00   Mr Brain Wo Contrast  Result Date: 09/13/2018 CLINICAL DATA:  Stroke. EXAM: MRI HEAD WITHOUT CONTRAST TECHNIQUE: Multiplanar, multiecho pulse sequences of the brain and surrounding structures were obtained without intravenous contrast. COMPARISON:  MRI head 09/06/2018.  CT a head 09/13/2018 FINDINGS: Brain: Compared to the prior MRI of 09/06/2018, there has been extension of right superior cerebellar artery territory infarct. Previously noted right inferior cerebellar infarct is similar to the prior study. No other areas of acute infarct be sides the right cerebellum. No associated hemorrhage. Ventricle size normal. No midline shift. Mild chronic white matter changes bilaterally. No mass lesion. Vascular: Normal arterial flow voids Skull and upper cervical spine: Negative Sinuses/Orbits: Mucosal edema paranasal sinuses.  Negative orbit Other: None IMPRESSION: Interval extension of right superior cerebellar infarct compared with the MRI of 09/06/2018. Right PICA infarct appears unchanged. No interval hemorrhage.  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 Nonocclusive clot in the left P1 segment is present without evidence of left posterior cerebral artery infarct. Electronically Signed   By: Marlan Palauharles  Clark M.D.   On: 09/13/2018 19:12   Ct Head Code Stroke Wo Contrast  Result Date: 09/13/2018 CLINICAL DATA:  Code stroke. Left-sided weakness and slurred speech. EXAM: CT HEAD WITHOUT CONTRAST TECHNIQUE: Contiguous axial images were obtained from the base of the skull through the vertex without intravenous contrast. COMPARISON:  Head MRI and CT/CTA 09/06/2018 FINDINGS: Brain: Well-defined low-density in the right cerebellum corresponds to the previously described infarct without evidence of substantial interval enlargement. Edema results in partial effacement of the  fourth ventricle without evidence of hydrocephalus. Mild diffuse hypoattenuation of the mid and upper pons is attributed to beam hardening given straight margins on sagittal reformats. No acute supratentorial infarct, intracranial hemorrhage, mass, midline shift, or extra-axial fluid collection is identified. Cerebral white matter hypodensities are unchanged and nonspecific but compatible with mild chronic small vessel ischemic disease. Vascular: Calcified atherosclerosis at the skull base. New focal hyperdensity in the distal basilar artery. Skull: No fracture or focal osseous lesion. Sinuses/Orbits: Mild mucosal thickening in the paranasal sinuses. Clear mastoid air cells. Unremarkable orbits. Other: Partially visualized maxillary dental disease. ASPECTS [facility]([place] Stroke Program Early CT Score) - Ganglionic level infarction (caudate, lentiform nuclei, internal capsule, insula, M1-M3 cortex): 7 - Supraganglionic infarction (M4-M6 cortex): 3 Total score (0-10 with 10 being normal): 10 IMPRESSION: 1. Subacute right cerebellar infarct. 2. No evidence of acute supratentorial infarct or hemorrhage. ASPECTS is 10. 3. New hyperdense appearance of the distal basilar artery. Angiographic imaging is recommended to evaluate for embolus. These results were communicated to Dr. Wilford CornerArora at 11:57 am on 09/13/2018 by text page via the [facility] messaging system. Electronically Signed   By: Sebastian AcheAllen  Grady M.D.   On: 09/13/2018 11:58    PHYSICAL EXAM  Temp:  [98.3 F (36.8 C)-98.6 F (37 C)] 98.3 F (36.8 C) (06/16 0740) Pulse Rate:  [78-126] 116 (06/16 1114) Resp:  [16-20] 20 (06/16 1114) BP: (163-177)/(61-101) 176/96 (06/16 1114) SpO2:  [98 %-100 %] 100 % (06/16 1114) Weight:  [55.6 kg] 55.6 kg (06/15 2032)  General - Well nourished, well developed, in no apparent distress.  Ophthalmologic - fundi not visualized due to noncooperation.  Cardiovascular - Regular rate and rhythm.  Mental Status -  Level of arousal and  orientation to year, place, and person were intact, but not orientated to month. Language including expression, naming, repetition, comprehension was assessed and found intact. Fund of knowledge impaired  Cranial Nerves II - XII - II - Visual field intact OU. III, IV, VI - Extraocular movements intact. V - Facial sensation intact bilaterally. VII - Facial movement intact bilaterally. VIII - Hearing & vestibular intact bilaterally. X - Palate elevates symmetrically. XI - Chin turning & shoulder shrug intact bilaterally. XII - Tongue protrusion intact.  Motor Strength - The patient's strength was normal in all extremities and pronator drift was absent.  Left BKA. Bulk was normal and fasciculations were absent.   Motor Tone - Muscle tone was assessed at the neck and appendages and was normal.  Reflexes - The patient's reflexes were symmetrical in all extremities and he had no pathological reflexes.  Sensory - Light touch, temperature/pinprick were assessed and were symmetrical.    Coordination - The patient had normal movements in the left hand with no ataxia or dysmetria.  However, right hand finger-to-nose ataxic.  Tremor was absent.  Gait and Station - deferred.   ASSESSMENT/PLAN Mr. Kristopher Ruiz is a 68 y.o. male with history of previoius stroke this month, HTN, HLD, tobacco abuse presenting with worsening dysarthria and L arm weakness.   Stroke:  Interval new R SCA infarct. R PICA infarct unchanged. new infarcts likely d/t partial recanalization of right VA.  Code Stroke CT head subacute R cerebellar stroke.     CTA head & neck some recanalization in R VA at C1 but not in C2. No flow R SCA previously open. No flow L P1 previously open.  70% bilateral ICA stenosis.  MRI new R SCA infarct. R PICA infarct unchanged. No L PCA infarct.  LDL 91  HgbA1c 6.4  Lovenox 40 mg sq daily for VTE prophylaxis  clopidogrel 75 mg daily prior to admission, now on aspirin 325 mg daily and  clopidogrel 75 mg daily. Continue DAPT for a total of 3 months then plavix alone  Therapy recommendations:  Continue HH PT  Disposition:  Return home  Recent stroke  09/08/2018 admitted for vertigo.  MRI showed right PICA infarct.  CTA head and neck showed right VA occlusion at C1 with distal reconstitution.  Left VA ends up as PICA.  70% stenosis bilateral ICA.  EF 60 to 65%.  Carotid Doppler showed bilateral 40 to 59% stenosis.  LDL 120 and A1c 6.2 UDS positive for THC.  Patient discharged with aspirin 81 and Plavix 75 as well as Lipitor 80.  Hypertension  Elevated 160-170s  Gradually decrease BP to goal at 130-150 in 2 to 3 days . BP goal 130-150 given posterior circulation multiple vessel occlusion and the bilateral ICA 70% stenosis.  Hyperlipidemia  Home meds:  lipitor 80, resumed in hospital  LDL 91, goal < 70  Continue statin at discharge  Tobacco abuse  Current smoker  Smoking cessation counseling provided  Nicotine patch provided  Pt is willing to quit  Other Stroke Risk Factors  Advanced age  UDS positive for THC, advised to stop smoking  Other Active Problems  L BKA d/t traumatic amputation  GERD  Hospital day # 1  Neurology will sign off. Please call with questions. [facility]. Thanks for the consult.  Marvel PlanJindong Roberts Bon, MD PhD Stroke Neurology 09/14/2018 3:47 PM   To contact Stroke Continuity provider, please refer to [email or web address]. After hours, contact General Neurology

## 2018-09-15 LAB — GLUCOSE, CAPILLARY
Glucose-Capillary: 96 mg/dL (ref 70–99)
Glucose-Capillary: 100 mg/dL — ABNORMAL HIGH (ref 70–99)
Glucose-Capillary: 113 mg/dL — ABNORMAL HIGH (ref 70–99)

## 2018-09-15 MED ORDER — METOPROLOL SUCCINATE ER 25 MG PO TB24: 25.0000 mg | ORAL_TABLET | Freq: Every day | ORAL | 0 refills | Status: DC

## 2018-09-15 MED ORDER — DILTIAZEM HCL ER BEADS 120 MG PO CP24: 120.0000 mg | ORAL_CAPSULE | Freq: Every day | ORAL | 2 refills | Status: DC

## 2018-09-15 MED ORDER — ASPIRIN 325 MG PO TBEC: 325.0000 mg | DELAYED_RELEASE_TABLET | Freq: Every day | ORAL | 0 refills | Status: DC

## 2018-09-15 NOTE — Progress Notes (Signed)
Patient discharged home with family. Discharge instructions given. Prescriptions sent to patient's pharmacy. IV and telemetry discontinued. Transported from unit via wheelchair with staff. Wendee Copp

## 2018-09-15 NOTE — Progress Notes (Signed)
Physical Therapy Wheelchair Narrative  Patient suffers from a CVA which impairs their ability to perform daily activities like toileting in the home. A walker will not resolve  issue with performing activities of daily living. A wheelchair will allow patient to safely perform daily activities. Patient is not able to propel themselves in the home using a standard weight wheelchair due to endurance. Patient can self propel in the lightweight wheelchair. Length of need Lifetime.  Accessories: elevating leg rests (ELRs), wheel locks, extensions and anti-tippers.  Rolinda Roan, PT, DPT Acute Rehabilitation Services Pager: [phone] Office: [phone]

## 2018-09-15 NOTE — Discharge Summary (Signed)
Physician Discharge Summary  Mahmoud Blazejewski ZOX:[id number] DOB: 1951/03/05 DOA: 09/13/2018  PCP: Georgiann Cocker, NP  Admit date: 09/13/2018 Discharge date: 09/15/2018  Admitted From: home Discharge disposition: home   Recommendations for Outpatient Follow-Up:   Home health Wheelchair Needs BP goal of 130-150  Continue DAPT for a total of 3 months then plavix alone   Discharge Diagnosis:   Active Problems:   Cerebellar stroke (HCC)   Hyperlipidemia   Essential hypertension    Discharge Condition: Improved.  Diet recommendation: Low sodium, heart healthy  Wound care: None.  Code status: Full.   History of Present Illness:   Kristopher Ruiz is an 68 y.o. 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  Patient is adamant that he was not speaking this way until this morning.  He also noted that the dizziness definitely started this morning and not previously.  At present patient states both of his symptoms have resolved and he feels back to baseline   Hospital Course by Problem:   Stroke:  Interval new R SCA infarct. R PICA infarct unchanged. new infarcts likely d/t partial recanalization of right VA.  Code Stroke CT head subacute R cerebellar stroke.     CTA head & neck some recanalization in R VA at C1 but not in C2. No flow R SCA previously open. No flow L P1 previously open.  70% bilateral ICA stenosis.  MRI new R SCA infarct. R PICA infarct unchanged. No L PCA infarct.  LDL 91  HgbA1c 6.4  clopidogrel 75 mg daily prior to admission, now on aspirin 325 mg daily and clopidogrel 75 mg daily. Continue DAPT for a total of 3 months then plavix alone   Recent stroke  09/08/2018 admitted for vertigo.   MRI showed right PICA infarct.  CTA head and neck showed right VA occlusion at C1 with distal reconstitution.  Left VA ends up as PICA.  70% stenosis bilateral ICA.  EF 60 to 65%.  Carotid Doppler showed bilateral 40 to 59% stenosis.  LDL 120 and A1c 6.2 UDS positive for THC.  Patient discharged with aspirin 81 and Plavix 75 as well as Lipitor 80.  Hypertension  BP goal 130-150 given posterior circulation multiple vessel occlusion and the bilateral ICA 70% stenosis.  Hyperlipidemia  Home meds:  lipitor 80, resumed in hospital  LDL 91, goal < 70  Continue statin at discharge  Tobacco abuse  Smoking cessation encouraged    Medical Consultants:    neurology  Discharge Exam:   Vitals:   09/15/18 0809 09/15/18 1146  BP: 120/85 (!) 142/88  Pulse: 100 93  Resp: 16 16  Temp: 98.1 F (36.7 C) 98.5 F (36.9 C)  SpO2: 99% 100%   Vitals:   09/14/18 2314 09/15/18 0315 09/15/18 0809 09/15/18 1146  BP: (!) 151/81 (!) 147/80 120/85 (!) 142/88  Pulse: 91 (!) 101 100 93  Resp: Temp: 98.3 F (36.8 C) 98.5 F (36.9 C) 98.1 F (36.7 C) 98.5 F (36.9 C)  TempSrc: Oral Oral Oral Oral  SpO2: 100% 100% 99% 100%  Weight:      Height:        General exam: Appears calm and comfortable.    The results of significant  diagnostics from this hospitalization (including imaging, microbiology, ancillary and laboratory) are listed below for reference.     Procedures and Diagnostic Studies:   Ct Angio Head W Or Wo Contrast  Result Date: 09/13/2018 CLINICAL DATA:  Left-sided weakness. Slurred speech. Abnormal head CT. EXAM: CT ANGIOGRAPHY HEAD AND NECK TECHNIQUE: Multidetector CT imaging of the head and neck was performed using the standard protocol during bolus administration of intravenous contrast. Multiplanar CT image reconstructions and MIPs were obtained to evaluate the vascular anatomy. Carotid stenosis measurements (when applicable) are obtained utilizing NASCET  criteria, using the distal internal carotid diameter as the denominator. CONTRAST:  137mL OMNIPAQUE IOHEXOL 350 MG/ML SOLN COMPARISON:  CT earlier same day.  CT a 09/06/2018 MRI 09/06/2018 FINDINGS: CTA NECK FINDINGS Aortic arch: Aortic atherosclerosis. Branching pattern is normal. No flow limiting origin stenosis. Right carotid system: No change since the previous study. Atherosclerotic disease at the carotid bifurcation and ICA bulb. Minimal diameter of the proximal ICA is 1.5 mm, consistent with a 70% stenosis. Left carotid system: No change since the previous study. Atherosclerotic disease at the carotid bifurcation and ICA bulb. Minimal diameter of 1.5 mm consistent with a 70% stenosis. Vertebral arteries: No change on the left. Non dominant vertebral artery widely patent at the origin and through the cervical region, terminating in PICA. Dominant right vertebral artery shows stenosis at its origin estimated at 50%. Scattered foci of plaque in the proximal several cm of vessel with additional serial stenoses. Previously, the vessel did not show flow beyond C6. On today's study, low is present extending up to C3. No flow demonstrated in the upper cervical region. Vessel reconstitutes by cervical collaterals at the C1 level and is patent through the foramen magnum to the basilar. Skeleton: Degenerative spondylosis as shown previously. Other neck: Negative Upper chest: Negative except for emphysema and scarring as shown previously. Review of the MIP images confirms the above findings CTA HEAD FINDINGS Anterior circulation: Both internal carotid arteries are patent through the skull base and siphon regions. Atherosclerotic calcification in the carotid siphon regions but no stenosis greater than 30%. The anterior and middle cerebral vessels are patent without proximal stenosis, aneurysm vascular malformation. Posterior circulation: Left vertebral artery terminates in PICA as seen previously, without visible supply  to the basilar. The reconstituted right vertebral arteries patent through the foramen magnum to the basilar. No right PICA is visible. There is a patent right anterior inferior cerebellar artery. Basilar artery is patent without evidence of embolic occlusion. In correlation with the noncontrast CT, it is possible that there is a distal embolus of similar density as the enhanced vessel. No flow is seen in the right superior cerebellar artery on today's study. There is also no flow or stenosis seen within the first 3 mm of the left P1 segment, unchanged since previous study, consistent with some embolus in that location. Venous sinuses: Patent and normal. Anatomic variants: None significant otherwise. Delayed phase: Not performed. Review of the MIP images confirms the above findings IMPRESSION: Following treatment, there is more flow in the right vertebral artery, now showing flow up to about the C3 level. There continues to be a segment of vessel at C2 which does not show any flow. Reconstituted flow in the distal right vertebral artery at C1. On today's study, there is no flow in the right superior cerebellar artery, which did show patency on the previous study. There is also an absence of demonstrable flow in the proximal 3 mm of the left P1  segment, which was widely patent previously. These findings are consistent with additional distal emboli or embolic migration since the study of 1 week ago. No acute anterior circulation finding/change. 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 Electronically Signed   By: Paulina FusiMark  Shogry M.D.   On: 09/13/2018 13:00   Ct Angio Neck W Or Wo Contrast  Result Date: 09/13/2018 CLINICAL DATA:  Left-sided weakness. Slurred speech. Abnormal head CT. EXAM: CT ANGIOGRAPHY HEAD AND NECK TECHNIQUE: Multidetector CT imaging of the head and neck was performed using the standard protocol during bolus administration of  intravenous contrast. Multiplanar CT image reconstructions and MIPs were obtained to evaluate the vascular anatomy. Carotid stenosis measurements (when applicable) are obtained utilizing NASCET criteria, using the distal internal carotid diameter as the denominator. CONTRAST:  100mL OMNIPAQUE IOHEXOL 350 MG/ML SOLN COMPARISON:  CT earlier same day.  CT a 09/06/2018 MRI 09/06/2018 FINDINGS: CTA NECK FINDINGS Aortic arch: Aortic atherosclerosis. Branching pattern is normal. No flow limiting origin stenosis. Right carotid system: No change since the previous study. Atherosclerotic disease at the carotid bifurcation and ICA bulb. Minimal diameter of the proximal ICA is 1.5 mm, consistent with a 70% stenosis. Left carotid system: No change since the previous study. Atherosclerotic disease at the carotid bifurcation and ICA bulb. Minimal diameter of 1.5 mm consistent with a 70% stenosis. Vertebral arteries: No change on the left. Non dominant vertebral artery widely patent at the origin and through the cervical region, terminating in PICA. Dominant right vertebral artery shows stenosis at its origin estimated at 50%. Scattered foci of plaque in the proximal several cm of vessel with additional serial stenoses. Previously, the vessel did not show flow beyond C6. On today's study, low is present extending up to C3. No flow demonstrated in the upper cervical region. Vessel reconstitutes by cervical collaterals at the C1 level and is patent through the foramen magnum to the basilar. Skeleton: Degenerative spondylosis as shown previously. Other neck: Negative Upper chest: Negative except for emphysema and scarring as shown previously. Review of the MIP images confirms the above findings CTA HEAD FINDINGS Anterior circulation: Both internal carotid arteries are patent through the skull base and siphon regions. Atherosclerotic calcification in the carotid siphon regions but no stenosis greater than 30%. The anterior and middle  cerebral vessels are patent without proximal stenosis, aneurysm vascular malformation. Posterior circulation: Left vertebral artery terminates in PICA as seen previously, without visible supply to the basilar. The reconstituted right vertebral arteries patent through the foramen magnum to the basilar. No right PICA is visible. There is a patent right anterior inferior cerebellar artery. Basilar artery is patent without evidence of embolic occlusion. In correlation with the noncontrast CT, it is possible that there is a distal embolus of similar density as the enhanced vessel. No flow is seen in the right superior cerebellar artery on today's study. There is also no flow or stenosis seen within the first 3 mm of the left P1 segment, unchanged since previous study, consistent with some embolus in that location. Venous sinuses: Patent and normal. Anatomic variants: None significant otherwise. Delayed phase: Not performed. Review of the MIP images confirms the above findings IMPRESSION: Following treatment, there is more flow in the right vertebral artery, now showing flow up to about the C3 level. There continues to be a segment of vessel at C2 which does not show any flow. Reconstituted flow in the distal right vertebral artery at C1. On today's study,  there is no flow in the right superior cerebellar artery, which did show patency on the previous study. There is also an absence of demonstrable flow in the proximal 3 mm of the left P1 segment, which was widely patent previously. These findings are consistent with additional distal emboli or embolic migration since the study of 1 week ago. No acute anterior circulation finding/change. 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 Electronically Signed   By: Paulina FusiMark  Shogry M.D.   On: 09/13/2018 13:00   Mr Brain Wo Contrast  Result Date: 09/13/2018 CLINICAL DATA:  Stroke. EXAM: MRI HEAD WITHOUT  CONTRAST TECHNIQUE: Multiplanar, multiecho pulse sequences of the brain and surrounding structures were obtained without intravenous contrast. COMPARISON:  MRI head 09/06/2018.  CT a head 09/13/2018 FINDINGS: Brain: Compared to the prior MRI of 09/06/2018, there has been extension of right superior cerebellar artery territory infarct. Previously noted right inferior cerebellar infarct is similar to the prior study. No other areas of acute infarct be sides the right cerebellum. No associated hemorrhage. Ventricle size normal. No midline shift. Mild chronic white matter changes bilaterally. No mass lesion. Vascular: Normal arterial flow voids Skull and upper cervical spine: Negative Sinuses/Orbits: Mucosal edema paranasal sinuses.  Negative orbit Other: None IMPRESSION: Interval extension of right superior cerebellar infarct compared with the MRI of 09/06/2018. Right PICA infarct appears unchanged. No interval hemorrhage. 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 Nonocclusive clot in the left P1 segment is present without evidence of left posterior cerebral artery infarct. Electronically Signed   By: Marlan Palauharles  Clark M.D.   On: 09/13/2018 19:12   Ct Head Code Stroke Wo Contrast  Result Date: 09/13/2018 CLINICAL DATA:  Code stroke. Left-sided weakness and slurred speech. EXAM: CT HEAD WITHOUT CONTRAST TECHNIQUE: Contiguous axial images were obtained from the base of the skull through the vertex without intravenous contrast. COMPARISON:  Head MRI and CT/CTA 09/06/2018 FINDINGS: Brain: Well-defined low-density in the right cerebellum corresponds to the previously described infarct without evidence of substantial interval enlargement. Edema results in partial effacement of the fourth ventricle without evidence of hydrocephalus. Mild diffuse hypoattenuation of the mid and upper pons is attributed to beam hardening given straight margins on sagittal reformats. No acute supratentorial infarct,  intracranial hemorrhage, mass, midline shift, or extra-axial fluid collection is identified. Cerebral white matter hypodensities are unchanged and nonspecific but compatible with mild chronic small vessel ischemic disease. Vascular: Calcified atherosclerosis at the skull base. New focal hyperdensity in the distal basilar artery. Skull: No fracture or focal osseous lesion. Sinuses/Orbits: Mild mucosal thickening in the paranasal sinuses. Clear mastoid air cells. Unremarkable orbits. Other: Partially visualized maxillary dental disease. ASPECTS [facility]([place] Stroke Program Early CT Score) - Ganglionic level infarction (caudate, lentiform nuclei, internal capsule, insula, M1-M3 cortex): 7 - Supraganglionic infarction (M4-M6 cortex): 3 Total score (0-10 with 10 being normal): 10 IMPRESSION: 1. Subacute right cerebellar infarct. 2. No evidence of acute supratentorial infarct or hemorrhage. ASPECTS is 10. 3. New hyperdense appearance of the distal basilar artery. Angiographic imaging is recommended to evaluate for embolus. These results were communicated to Dr. Wilford CornerArora at 11:57 am on 09/13/2018 by text page via the [facility] messaging system. Electronically Signed   By: Sebastian AcheAllen  Grady M.D.   On: 09/13/2018 11:58     Labs:   Basic Metabolic Panel: Recent Labs  Lab 09/13/18 1128 09/13/18 1137 09/13/18 2105  NA 137  --   --   K 3.7  --   --  CL 104  --   --   CO2 25  --   --   GLUCOSE 194*  --   --   BUN 13  --   --   CREATININE 0.88 0.90 0.69  CALCIUM 9.0  --   --    GFR Estimated Creatinine Clearance: 69.5 mL/min (by C-G formula based on SCr of 0.69 mg/dL). Liver Function Tests: Recent Labs  Lab 09/13/18 1128  AST 27  ALT 25  ALKPHOS 84  BILITOT 0.6  PROT 6.3*  ALBUMIN 3.6   No results for input(s): LIPASE, AMYLASE in the last 168 hours. No results for input(s): AMMONIA in the last 168 hours. Coagulation profile Recent Labs  Lab 09/13/18 1128  INR 1.1    CBC: Recent Labs  Lab 09/13/18 1128  09/13/18 2105  WBC 7.2 7.9  NEUTROABS 4.0  --   HGB 12.1* 13.1  HCT 37.9* 39.7  MCV 84.4 83.1  PLT 187 199   Cardiac Enzymes: Recent Labs  Lab 09/13/18 1128  TROPONINI <0.03   BNP: Invalid input(s): POCBNP CBG: Recent Labs  Lab 09/14/18 1112 09/14/18 1602 09/14/18 2111 09/15/18 0605 09/15/18 1148  GLUCAP 97 101* 123* 96 100*   D-Dimer No results for input(s): DDIMER in the last 72 hours. Hgb A1c Recent Labs    09/14/18 0351  HGBA1C 6.4*   Lipid Profile Recent Labs    09/14/18 0351  CHOL 159  HDL 55  LDLCALC 91  TRIG 64  CHOLHDL 2.9   Thyroid function studies No results for input(s): TSH, T4TOTAL, T3FREE, THYROIDAB in the last 72 hours.  Invalid input(s): FREET3 Anemia work up No results for input(s): VITAMINB12, FOLATE, FERRITIN, TIBC, IRON, RETICCTPCT in the last 72 hours. Microbiology Recent Results (from the past 240 hour(s))  SARS Coronavirus 2 (CEPHEID - Performed in [facility] hospital lab), Hosp Order     Status: None   Collection Time: 09/06/18  3:08 PM   Specimen: Nasopharyngeal Swab  Result Value Ref Range Status   SARS Coronavirus 2 NEGATIVE NEGATIVE Final    Comment: (NOTE) If result is NEGATIVE SARS-CoV-2 target nucleic acids are NOT DETECTED. The SARS-CoV-2 RNA is generally detectable in upper and lower  respiratory specimens during the acute phase of infection. The lowest  concentration of SARS-CoV-2 viral copies this assay can detect is 250  copies / mL. A negative result does not preclude SARS-CoV-2 infection  and should not be used as the sole basis for treatment or other  patient management decisions.  A negative result may occur with  improper specimen collection / handling, submission of specimen other  than nasopharyngeal swab, presence of viral mutation(s) within the  areas targeted by this assay, and inadequate number of viral copies  (<250 copies / mL). A negative result must be combined with clinical  observations,  patient history, and epidemiological information. If result is POSITIVE SARS-CoV-2 target nucleic acids are DETECTED. The SARS-CoV-2 RNA is generally detectable in upper and lower  respiratory specimens dur ing the acute phase of infection.  Positive  results are indicative of active infection with SARS-CoV-2.  Clinical  correlation with patient history and other diagnostic information is  necessary to determine patient infection status.  Positive results do  not rule out bacterial infection or co-infection with other viruses. If result is PRESUMPTIVE POSTIVE SARS-CoV-2 nucleic acids MAY BE PRESENT.   A presumptive positive result was obtained on the submitted specimen  and confirmed on repeat testing.  While 2019 novel  coronavirus  (SARS-CoV-2) nucleic acids may be present in the submitted sample  additional confirmatory testing may be necessary for epidemiological  and / or clinical management purposes  to differentiate between  SARS-CoV-2 and other Sarbecovirus currently known to infect humans.  If clinically indicated additional testing with an alternate test  methodology [phone]([id number]) is advised. The SARS-CoV-2 RNA is generally  detectable in upper and lower respiratory sp ecimens during the acute  phase of infection. The expected result is Negative. Fact Sheet for Patients:  BoilerBrush.com.cyhttps://www.fda.gov/media/136312/download Fact Sheet for Healthcare Providers: https://pope.com/https://www.fda.gov/media/136313/download This test is not yet approved or cleared by the [place] FDA and has been authorized for detection and/or diagnosis of SARS-CoV-2 by FDA under an Emergency Use Authorization (EUA).  This EUA will remain in effect (meaning this test can be used) for the duration of the COVID-19 declaration under Section 564(b)(1) of the Act, 21 U.S.C. section 360bbb-3(b)(1), unless the authorization is terminated or revoked sooner. Performed at [facility], [address] [place],  [place] [address]   SARS Coronavirus 2     Status: None   Collection Time: 09/13/18  3:37 PM  Result Value Ref Range Status   SARS Coronavirus 2 NOT DETECTED NOT DETECTED Final    Comment: (NOTE) SARS-CoV-2 target nucleic acids are NOT DETECTED. The SARS-CoV-2 RNA is generally detectable in upper and lower respiratory specimens during the acute phase of infection.  Negative  results do not preclude SARS-CoV-2 infection, do not rule out co-infections with other pathogens, and should not be used as the sole basis for treatment or other patient management decisions.  Negative results must be combined with clinical observations, patient history, and epidemiological information. The expected result is Not Detected. Fact Sheet for Patients: http://www.biofiredefense.com/wp-content/uploads/2020/03/BIOFIRE-COVID -19-patients.pdf Fact Sheet for Healthcare Providers: http://www.biofiredefense.com/wp-content/uploads/2020/03/BIOFIRE-COVID -19-hcp.pdf This test is not yet approved or cleared by the [place] FDA and  has been authorized for detection and/or diagnosis of SARS-CoV-2 by FDA under an Emergency Use Authorization (EUA).  This EUA will remain in effec t (meaning this test can be used) for the duration of  the COVID-19 declaration under Section 564(b)(1) of the Act, 21 U.S.C. section 360bbb-3(b)(1), unless the authorization is terminated or revoked sooner. Performed at [facility], [address] [place], [place] [address]      Discharge Instructions:   Discharge Instructions    Ambulatory referral to Neurology   Complete by: As directed    Follow up with stroke clinic NP (Vivion Romano Vanschaick or Darrol Angelarolyn Martin, if both not available, consider Manson AllanSethi, Penumali, or Ahern) [facility] about 4 weeks. Thanks.   Diet - low sodium heart healthy   Complete by: As directed    Discharge instructions   Complete by: As directed    ASA plus plavix x 3 months then plavix alone BP to goal  at 130-150   Increase activity slowly   Complete by: As directed      Allergies as of 09/15/2018      Reactions   Ibuprofen Other (See Comments)   GI Problems      Medication List    STOP taking these medications   hydrochlorothiazide 25 MG tablet Commonly known as: HYDRODIURIL     TAKE these medications   acetaminophen 500 MG tablet Commonly known as: TYLENOL Take 1,000 mg by mouth every 6 (six) hours as needed for mild pain or headache.   aspirin 325 MG EC tablet Take 1 tablet (325 mg total) by mouth daily.  Start taking on: September 16, 2018   atorvastatin 80 MG tablet Commonly known as: LIPITOR Take 1 tablet (80 mg total) by mouth daily for 30 days.   clopidogrel 75 MG tablet Commonly known as: PLAVIX Take 1 tablet (75 mg total) by mouth daily for 30 days.   diltiazem 120 MG 24 hr capsule Commonly known as: TIAZAC Take 1 capsule (120 mg total) by mouth daily. Start taking on: September 20, 2018 What changed: These instructions start on September 20, 2018. If you are unsure what to do until then, ask your doctor or other care provider.   gabapentin 100 MG capsule Commonly known as: NEURONTIN Take 1 capsule (100 mg total) by mouth 3 (three) times daily.   metoprolol succinate 25 MG 24 hr tablet Commonly known as: TOPROL-XL Take 1 tablet (25 mg total) by mouth daily. Take with or immediately following a meal. What changed:   medication strength  how much to take   nicotine 21 mg/24hr patch Commonly known as: NICODERM CQ - dosed in mg/24 hours Place 1 patch (21 mg total) onto the skin daily for 14 days.   omeprazole 40 MG capsule Commonly known as: PRILOSEC Take 1 capsule (40 mg total) by mouth daily.            Durable Medical Equipment  (From admission, onward)         Start     Ordered   09/15/18 1241  For home use only DME lightweight manual wheelchair with seat cushion  Once    Comments: Patient suffers from cva which impairs their ability to perform  daily activities like toileting in the home.  A walker will not resolve  issue with performing activities of daily living. A wheelchair will allow patient to safely perform daily activities. Patient is not able to propel themselves in the home using a standard weight wheelchair due to endurance. Patient can self propel in the lightweight wheelchair. Length of need Lifetime. Accessories: elevating leg rests (ELRs), wheel locks, extensions and anti-tippers.   09/15/18 1240         Follow-up Information    [facility]. Schedule an appointment as soon as possible for a visit in 4 week(s).   Specialty: Neurology Contact information: [address] [place] [address] [phone]       Renaye Rakers, MD Follow up on 09/21/2018.   Specialty: Family Medicine Why: You will see Dr Bonita Quin. Your appt time is 11:45 am. please arrive 20 minutes early and bring: picture ID, insurance card, medications you take in their bottles Contact information: [address] [place] [address] [phone]        Care, [facility] Follow up.   Specialty: Home Health Services Why: They will call you to schedule the next visit. Contact information: [address] [place] [address] [phone]            Time coordinating discharge: 35 min  Signed:  Joseph Art DO  [facility] 09/15/2018, 12:52 PM

## 2018-09-15 NOTE — Evaluation (Signed)
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 He was participating in [place]. Pt presented back to the ED when he became dizzy and started "slobbering and talking slurry". MRI of the brain revealed interval extension of right superior cerebellar infarct compared with the MRI of 09/06/2018.   Clinical Impression  Pt admitted with above diagnosis. Pt currently with functional limitations due to the deficits listed below (see PT Problem List). At the time of PT eval pt was able to perform transfers with min assist and ambulation ~75' with up to max assist to avoid a fall 2 significant LOB with prosthesis donned. Discharge is planned for today and plan is for pt to resume HHPT services at d/c. Recommending a wheelchair for longer distance ambulation and to decrease risk for falls, and 24 hour support from girlfriend at home. Acutely, pt will benefit from skilled PT to increase their independence and safety with mobility to allow discharge to the venue listed below.       Follow Up Recommendations Home health PT;Supervision/Assistance - 24 hour    Equipment Recommendations  Rolling walker with 5" wheels    Recommendations for Other Services       Precautions / Restrictions Precautions Precautions: Fall Restrictions Weight Bearing Restrictions: No      Mobility  Bed Mobility Overal bed mobility: Modified Independent             General bed mobility comments: pt was able to transition to EOB without assistance. Increased time but able to complete with use of bedrails.   Transfers Overall transfer level: Needs assistance Equipment used: Rolling walker (2 wheeled);Straight cane Transfers: Sit  to/from Stand Sit to Stand: Min assist         General transfer comment: Hands-on guarding for balance support. VC's for hand placement on seated surface surface.   Ambulation/Gait Ambulation/Gait assistance: Min assist;Max assist Gait Distance (Feet): 75 Feet Assistive device: Rolling walker (2 wheeled) Gait Pattern/deviations: Step-through pattern;Decreased stride length;Trunk flexed;Staggering right Gait velocity: Decreased Gait velocity interpretation: <1.31 ft/sec, indicative of household ambulator General Gait Details: Min assist for balance with RW, and x4 LOB requiring max assist to recover.   Stairs            Wheelchair Mobility    Modified Rankin (Stroke Patients Only) Modified Rankin (Stroke Patients Only) Pre-Morbid Rankin Score: Moderately severe disability Modified Rankin: Moderately severe disability     Balance Overall balance assessment: Needs assistance Sitting-balance support: No upper extremity supported;Feet supported Sitting balance-Leahy Scale: Good     Standing balance support: No upper extremity supported;During functional activity Standing balance-Leahy Scale: Poor                               Pertinent Vitals/Pain Pain Assessment: No/denies pain Pain Intervention(s): Monitored during session    Home Living Family/patient expects to be discharged to:: Private residence Living Arrangements: Spouse/significant other Available Help at Discharge: Friend(s);Available 24 hours/day Type of Home: House Home Access: Level entry     Home Layout: One level Home Equipment: Walker - 2 wheels      Prior Function Level of Independence: Independent with assistive device(s)  Hand Dominance   Dominant Hand: Right    Extremity/Trunk Assessment   Upper Extremity Assessment Upper Extremity Assessment: LUE deficits/detail LUE Deficits / Details: Pt reports numbness in LUE. With light touch testing pt reports  no difference between R and L, however reports when he holds something in his hand it feels numb, and when they draw blood he can't feel it. Strength 5/5    Lower Extremity Assessment Lower Extremity Assessment: LLE deficits/detail LLE Deficits / Details: Pt reports numbness in LLE    Cervical / Trunk Assessment Cervical / Trunk Assessment: Normal  Communication   Communication: No difficulties  Cognition Arousal/Alertness: Awake/alert Behavior During Therapy: WFL for tasks assessed/performed Overall Cognitive Status: History of cognitive impairments - at baseline Area of Impairment: Problem solving;Awareness;Safety/judgement                         Safety/Judgement: Decreased awareness of safety;Decreased awareness of deficits Awareness: Emergent Problem Solving: Slow processing        General Comments      Exercises     Assessment/Plan    PT Assessment Patient needs continued PT services  PT Problem List Decreased strength;Decreased activity tolerance;Decreased balance;Decreased mobility;Decreased knowledge of use of DME       PT Treatment Interventions DME instruction;Gait training;Stair training;Functional mobility training;Therapeutic activities;Therapeutic exercise;Neuromuscular re-education;Patient/family education    PT Goals (Current goals can be found in the Care Plan section)  Acute Rehab PT Goals Patient Stated Goal: get home  PT Goal Formulation: With patient    Frequency Min 4X/week   Barriers to discharge        Co-evaluation               AM-PAC PT "6 Clicks" Mobility  Outcome Measure Help needed turning from your back to your side while in a flat bed without using bedrails?: None Help needed moving from lying on your back to sitting on the side of a flat bed without using bedrails?: None Help needed moving to and from a bed to a chair (including a wheelchair)?: A Little Help needed standing up from a chair using your arms  (e.g., wheelchair or bedside chair)?: A Little Help needed to walk in hospital room?: A Lot Help needed climbing 3-5 steps with a railing? : A Lot 6 Click Score: 18    End of Session Equipment Utilized During Treatment: Gait belt Activity Tolerance: Patient tolerated treatment well Patient left: in bed Nurse Communication: Mobility status PT Visit Diagnosis: Other abnormalities of gait and mobility (R26.89)    Time: [id number] PT Time Calculation (min) (ACUTE ONLY): 28 min   Charges:   PT Evaluation $PT Eval Moderate Complexity: 1 Mod PT Treatments $Gait Training: 8-22 mins        Conni SlipperLaura Tanay Misuraca, PT, DPT Acute Rehabilitation Services Pager: [phone] Office: [phone]   Marylynn PearsonLaura D Oluwatamilore Starnes 09/15/2018, 1:14 PM

## 2018-09-15 NOTE — TOC Initial Note (Addendum)
Transition of Care [facility](TOC) - Initial/Assessment Note    Patient Details  Name: Kristopher Ruiz MRN: [id number] Date of Birth: 16-Dec-1950  Transition of Care [facility](TOC) CM/SW Contact:    Kermit BaloKelli F Felicitas Sine, RN Phone Number: 09/15/2018, 12:15 PM  Clinical Narrative:                 Pt readmitted to hospital with stroke. [facility] and wants to continue with their services.  Pt missed new PCP appt d/t being in the hospital. CM called Dr Bonita QuinStrup office and got his appt rescheduled and placed the information on the AVS.  Pt states he has transportation home.  1250 pm: PT feels he could benefits from wheelchair at home. CM notified Zack with [employer] and it will be delivered to the patients room.   Expected Discharge Plan: Home w Home Health Services Barriers to Discharge: No Barriers Identified   Patient Goals and CMS Choice   CMS Medicare.gov Compare Post Acute Care list provided to:: Patient Choice offered to / list presented to : Patient  Expected Discharge Plan and Services Expected Discharge Plan: Home w Home Health Services   Discharge Planning Services: CM Consult Post Acute Care Choice: Home Health, Resumption of Svcs/PTA Provider Living arrangements for the past 2 months: Apartment Expected Discharge Date: 09/15/18                         HH Arranged: RN, PT [facility] Contacted: 09/15/18 Time HH Agency Contacted: 1200 [facility]  Prior Living Arrangements/Services Living arrangements for the past 2 months: Apartment Lives with:: Spouse Patient language and need for interpreter reviewed:: Yes(no needs) Do you feel safe going back to the place where you live?: Yes      Need for Family Participation in Patient Care: Yes (Comment) Care giver support system in place?: Yes (comment)(spouse) Current home services: DME, Home PT, Home RN(walker) Criminal Activity/Legal Involvement  Pertinent to Current Situation/Hospitalization: No - Comment as needed  Activities of Daily Living Home Assistive Devices/Equipment: Environmental consultantWalker (specify type), Wheelchair ADL Screening (condition at time of admission) Patient's cognitive ability adequate to safely complete daily activities?: Yes Is the patient deaf or have difficulty hearing?: No Does the patient have difficulty seeing, even when wearing glasses/contacts?: No Does the patient have difficulty concentrating, remembering, or making decisions?: No Patient able to express need for assistance with ADLs?: Yes Does the patient have difficulty dressing or bathing?: Yes Independently performs ADLs?: No Communication: Independent Dressing (OT): Needs assistance Is this a change from baseline?: Change from baseline, expected to last <3days Grooming: Needs assistance Is this a change from baseline?: Change from baseline, expected to last <3 days Feeding: Independent Bathing: Needs assistance Is this a change from baseline?: Change from baseline, expected to last <3 days Toileting: Needs assistance Is this a change from baseline?: Change from baseline, expected to last <3 days In/Out Bed: Needs assistance Is this a change from baseline?: Change from baseline, expected to last <3 days Walks in Home: Independent with device (comment) Does the patient have difficulty walking or climbing stairs?: No Weakness of Legs: None Weakness of Arms/Hands: Left  Permission Sought/Granted                  Emotional Assessment Appearance:: Appears stated age Attitude/Demeanor/Rapport: Engaged Affect (typically observed): Accepting, Pleasant Orientation: : Oriented to Self, Oriented to Place, Oriented to  Time, Oriented to Situation   Psych Involvement: No (comment)  Admission diagnosis:  Acute embolic stroke [facility]) [D40.8] Patient Active Problem List   Diagnosis Date Noted  . TIA (transient ischemic attack) 09/13/2018  . Cerebellar  stroke ([place]) 09/06/2018  . Hypoglycemia due to insulin 09/06/2018  . Tobacco abuse 09/06/2018  . Hyperlipidemia 09/06/2018  . Essential hypertension 09/06/2018  . 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[place]) 04/16/2017   PCP:  Patient, No Pcp Per Pharmacy:   [id number] - [place], [place] [address] [place] [address] Phone: [phone] Fax: [phone]  [place], [place] - [address] [place] [address] Phone: [phone] Fax: [phone]     Social Determinants of Health ([place]) Interventions    Readmission Risk Interventions No flowsheet data found.

## 2018-09-17 ENCOUNTER — Other Ambulatory Visit: Payer: Self-pay

## 2018-09-17 NOTE — Patient Outreach (Signed)
[place] [facility]) Care Management  09/17/2018  Cace Osorto [birth year] [id number]   EMMI-Stroke not on APL RED ON EMMI ALERT Day #1 Date:09/10/2018 Red Alert Reason: Filled new prescriptions? no  Outreach attempt: spoke with patient.  He is able to verify HIPAA. He states that he is doing well. He states that his fiance is taking care of him and helping.  He states that he has physical therapy in helping with exercises.  He mentioned having a device to support his right side.  Advised him that the therapist would evaluate all his needs and make suggestions/ send orders to the physician.  He verbalized understanding.  He states he has all his medications and that he has a doctors appointment set up. He denies problems with transportation.  Patient states that he has no other questions or concerns at this time.    Plan: RN CM will close case at this time.  Jone Baseman, RN, MSN [place] [facility] Care Management Coordinator Direct Line [phone] Cell [phone] Toll Free: [phone]  Fax: [phone]

## 2018-09-17 NOTE — Patient Outreach (Signed)
[place] [facility]) Care Management  09/17/2018  Kartel Wolbert 04-Jul-1950 [id number]   EMMI-Stroke not on APL RED ON EMMI ALERT Day #1 Date:09/10/2018 Red Alert Reason: Filled new prescriptions? no  Outreach attempt:No answer. HIPAA compliant voice message left.   Plan: RN CM will wait return call.    Jone Baseman, RN, MSN [place] Management Care Management Coordinator Direct Line [phone] Cell [phone] Toll Free: [phone]  Fax: [phone]

## 2018-09-27 ENCOUNTER — Other Ambulatory Visit: Payer: Self-pay

## 2018-09-27 NOTE — Patient Outreach (Signed)
[place] [facility]) Care Management  09/27/2018  Kristopher Ruiz October 01, 1950 [id number]   EMMI- Stroke not on APL RED ON EMMI ALERT Day # 9 Date: 09/25/2018 Red Alert Reason: Questions/problems with meds? yes  Outreach attempt: spoke with patient. He is able to verify HIPAA.  Discussed red alert with patient . He states that he has no problems with his medications and that his daughter helps him with his medications. Patient just states he wants to get his strength back and drive his car. He states he is exercising.  Advised patient to continue his exercise and follow up with his physician for clearance to drive.  He verbalized understanding and denies any further questions or problems.   Plan: RN CM will close case.   Jone Baseman, RN, MSN [facility] Care Management Care Management Coordinator Direct Line [phone] Toll Free: [phone]  Fax: [phone]

## 2018-09-29 ENCOUNTER — Other Ambulatory Visit: Payer: Self-pay

## 2018-09-29 NOTE — Patient Outreach (Signed)
[place] [facility]) Care Management  09/29/2018  Kristopher Ruiz March 08, 1951 [id number]   Returned call to patient after he called the office. Patient states he got a voice mail and was returning the call.  Patient denies any current concerns.  CM advised him that CM spoke with him on 09/27/2018.  He verbalized understanding and wanted to make sure he has not missed a call.   Jone Baseman, RN, MSN [place] Management Care Management Coordinator Direct Line [phone] Cell [phone] Toll Free: [phone]  Fax: [phone]

## 2018-10-26 ENCOUNTER — Inpatient Hospital Stay: Payer: Medicare HMO | Admitting: Adult Health

## 2019-05-24 ENCOUNTER — Ambulatory Visit: Payer: Medicare Other | Admitting: Family Medicine

## 2019-06-01 ENCOUNTER — Other Ambulatory Visit: Payer: Self-pay

## 2019-06-01 ENCOUNTER — Ambulatory Visit (INDEPENDENT_AMBULATORY_CARE_PROVIDER_SITE_OTHER): Payer: Medicare Other | Admitting: Family Medicine

## 2019-06-01 VITALS — BP 130/80 | HR 102

## 2019-06-01 DIAGNOSIS — I10 Essential (primary) hypertension: Secondary | ICD-10-CM

## 2019-06-01 DIAGNOSIS — Z720 Tobacco use: Secondary | ICD-10-CM | POA: Diagnosis not present

## 2019-06-01 DIAGNOSIS — I639 Cerebral infarction, unspecified: Secondary | ICD-10-CM | POA: Diagnosis not present

## 2019-06-01 DIAGNOSIS — S88112A Complete traumatic amputation at level between knee and ankle, left lower leg, initial encounter: Secondary | ICD-10-CM

## 2019-06-01 DIAGNOSIS — Z7689 Persons encountering health services in other specified circumstances: Secondary | ICD-10-CM

## 2019-06-01 MED ORDER — LIDOCAINE 5 % EX PTCH: 1.0000 | MEDICATED_PATCH | CUTANEOUS | 0 refills | Status: DC

## 2019-06-01 MED ORDER — NICOTINE 7 MG/24HR TD PT24: 7.0000 mg | MEDICATED_PATCH | Freq: Every day | TRANSDERMAL | 0 refills | Status: DC

## 2019-06-01 MED ORDER — NICOTINE 7 MG/24HR TD PT24: 7.0000 mg | MEDICATED_PATCH | Freq: Every day | TRANSDERMAL | Status: DC

## 2019-06-01 NOTE — Assessment & Plan Note (Signed)
Patient requesting assessment for refitting or a new prosthesis.  He says that his most recent evaluation was 2 to 3 years ago. -We will send in referral for this

## 2019-06-01 NOTE — Assessment & Plan Note (Signed)
Blood pressure today was 130/80.  Home medications include Norvasc 10 mg, metoprolol XR 25 mg hydrochlorothiazide 25 mg. -Continue home medications at this time

## 2019-06-01 NOTE — Assessment & Plan Note (Addendum)
Patient had cerebellar infarct in June 2020.  He was discharged on DAPT which he was supposed to take for 3 months and then switched to taking Plavix alone after that.  Patient is currently taking 81 mg aspirin but has not been taking his Plavix.  We do not have patient's recent records. -Patient may need to be restarted on Plavix 75 mg

## 2019-06-01 NOTE — Patient Instructions (Addendum)
It was a pleasure meeting you today.  I am going to need your records from your previous primary care provider.  During your request for today I was able to complete a physical which will be uploaded to your MyChart.  I was also able to put a referral in for pain management as well as an optometrist.  I will work on getting the referral for the anger center taking care of.  I would like to see you back in 2 to 3 weeks and we will try to deal with a few more if your concerns.  I hope you have a wonderful night!

## 2019-06-01 NOTE — Assessment & Plan Note (Signed)
Patient PMH, PSH, allergies, family history, social history all reviewed. -Most recent lipid panel performed June 2020, will repeat in June 2021 -Hemoglobin A1c in June 2020 was 6.4.  Consider repeating at next visit -Most recent creatinine was 0.9 in June 2020, consider BMP at next visit -Discussed colonoscopy in the next visit -Discussed flu shot at next visit -Discussed pneumonia vaccine at next visit  -

## 2019-06-01 NOTE — Progress Notes (Addendum)
SUBJECTIVE:   CHIEF COMPLAINT / HPI:   Current concerns  Patient has multiple current concerns today and we will address a few of them with this visit.  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  He says that he was treated for chronic pain at his previous primary care and was given oxycodone.  He is also requesting a referral for a refitting of his prosthesis or a new prosthesis.  He says that they will most recent fitting was "several years ago".  Patient would also like referral for an optometrist.  PMH HTN-Toprol-XL 25 mg, hydrochlorothiazide, Norvasc, HLD- lipitor 80 mg  LLE amputation in 2010 or 2011  Tabacco abuse  - one pack every 3 days  Cerebellar stroke July 2020   Surgeries  LLE amputation in 2010 or 2011 Right hip surgery at approximately the same time he had his left lower extremity amputation Reports surgery on head about 50 years ago  Allergies  NKDA   Family Hx  Diabetes- mother and father  HTN- father and mother  HLD- father  Cancer- father (esophageal), sister (unsure), sister (lung cancer)   Social  Patient reports that he lives with his daughter and grandchildren.  He has 2 grandchildren.  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  They have no pets in the house.  He reports that he smokes 1 pack of cigarettes every 3 days but that he is trying to quit and is interested in NicoDerm patches.  He denies alcohol use.  Denies any illicit substance use.     OBJECTIVE:   BP 130/80   Pulse (!) 102   SpO2 96%   General: NAD, sitting in walking wheelchair with arm braces HEENT: Atraumatic. Normocephalic. Normal TMs and ear canals bilaterally, poor dentition patient has left pupil that is reactive to light.  Right pupil was pinpoint.  Patient reports that it has been like this since his brain injury as a child. Neck: No cervical  lymphadenopathy.  Cardiac: RRR, no m/r/g Respiratory: CTAB, normal work of breathing Abdomen: soft, nontender, nondistended, bowel sounds normal Skin: warm and dry, no rashes noted Neuro: alert and oriented MSK: Patient has left lower extremity amputation below the knee.  He reports that this was from an injury where something fell on his leg. Patient has 5 out of 5 strength in upper extremities bilaterally Patient has 4 out of 5 strength in right lower extremity.  Patient has pain with flexion and extension of the left lower extremity at the hip.   ASSESSMENT/PLAN:   Essential hypertension Blood pressure today was 130/80.  Home medications include Norvasc 10 mg, metoprolol XR 25 mg hydrochlorothiazide 25 mg. -Continue home medications at this time  Establishing care with new doctor, encounter for Patient PMH, PSH, allergies, family history, social history all reviewed. -Most recent lipid panel performed June 2020, will repeat in June 2021 -Hemoglobin A1c in June 2020 was 6.4.  Consider repeating at next visit -Most recent creatinine was 0.9 in June 2020, consider BMP at next visit -Discussed colonoscopy in the next visit -Discussed flu shot at next visit -Discussed pneumonia vaccine at next visit  -  Cerebellar stroke [facility]) Patient had cerebellar infarct in June 2020.  He was discharged on DAPT which he was supposed to take for 3 months and then switched to taking Plavix alone after that.  Patient is currently taking 81 mg aspirin but has not been taking his Plavix.  We do not have patient's recent records. -Patient may need to be restarted on Plavix 75 mg   Below-knee amputation of left lower extremity [facility]) Patient requesting assessment for refitting or a new prosthesis.  He says that his most recent evaluation was 2 to 3 years ago. -We will send in referral for this   Derrel Nip, MD [facility]

## 2019-06-02 ENCOUNTER — Telehealth: Payer: Self-pay | Admitting: *Deleted

## 2019-06-02 NOTE — Telephone Encounter (Signed)
Received fax from pharmacy, PA needed on lidocaine patches.  Clinical questions submitted via Cover My Meds.  Waiting on response, could take up to 72 hours.  Cover My Meds info: Key: [id number]  Jone Baseman, CMA

## 2019-06-06 ENCOUNTER — Telehealth: Payer: Self-pay | Admitting: Family Medicine

## 2019-06-06 NOTE — Telephone Encounter (Signed)
Spoke with patient regarding Lidoderm patches also spoke to his daughter.  She said that they ended up just purchasing them over-the-counter because there was a good Rx discount coupon.  She also had questions regarding getting patient's [place] 2 form filled out.  She says that she would check on it and possibly bring a form to be filled out to the clinic.  She was also requesting a referral to the [facility] but she is going to contact them to find out exactly what she needs.

## 2019-06-07 ENCOUNTER — Encounter: Payer: Self-pay | Admitting: Physical Medicine & Rehabilitation

## 2019-07-06 ENCOUNTER — Telehealth: Payer: Self-pay | Admitting: Family Medicine

## 2019-07-06 NOTE — Telephone Encounter (Signed)
Step-daughter submitted [place] Medicaid Long Term  Care [place]2  Form to be completed Late appt 06/01/19 Form place in white team foldert

## 2019-07-07 NOTE — Telephone Encounter (Signed)
Clinical info completed on FL2 form.  Place form in Dr. Damian Leavell box for completion.  Mayu Ronk Zimmerman Rumple, CMA

## 2019-07-14 NOTE — Telephone Encounter (Signed)
Called number on form to let them know forms are completed and ready for them to pick up.

## 2019-07-15 ENCOUNTER — Ambulatory Visit: Payer: Medicare Other | Admitting: Physical Medicine & Rehabilitation

## 2019-07-26 ENCOUNTER — Telehealth: Payer: Self-pay | Admitting: Family Medicine

## 2019-07-26 ENCOUNTER — Encounter: Payer: Medicare Other | Admitting: Physical Medicine & Rehabilitation

## 2019-07-26 NOTE — Telephone Encounter (Signed)
Daughter is calling and would like to know what the status of her father's referral to go to [facility]. Kristopher Ruiz

## 2019-08-05 ENCOUNTER — Encounter: Payer: Self-pay | Admitting: Family Medicine

## 2019-08-05 ENCOUNTER — Other Ambulatory Visit: Payer: Self-pay

## 2019-08-05 ENCOUNTER — Ambulatory Visit (INDEPENDENT_AMBULATORY_CARE_PROVIDER_SITE_OTHER): Payer: Medicare Other | Admitting: Family Medicine

## 2019-08-05 ENCOUNTER — Encounter: Payer: Medicare Other | Attending: Physical Medicine & Rehabilitation | Admitting: Physical Medicine & Rehabilitation

## 2019-08-05 VITALS — BP 128/74 | HR 92 | Ht 63.0 in

## 2019-08-05 DIAGNOSIS — F17209 Nicotine dependence, unspecified, with unspecified nicotine-induced disorders: Secondary | ICD-10-CM | POA: Diagnosis not present

## 2019-08-05 DIAGNOSIS — Z720 Tobacco use: Secondary | ICD-10-CM

## 2019-08-05 DIAGNOSIS — S88112A Complete traumatic amputation at level between knee and ankle, left lower leg, initial encounter: Secondary | ICD-10-CM | POA: Diagnosis not present

## 2019-08-05 NOTE — Patient Instructions (Signed)
It was wonderful to see you today.  I will complete the note for this visit and you may come pick it up early next week so that you will have it available for the [facility].  I have also filled out a prescription for you to go to the [facility] to have his prosthesis refit.  Regarding pain I have placed a referral for pain management.  Someone should be contacting you regarding an appointment.  If you have any questions or concerns please feel free to call our clinic.  I hope you have a wonderful day!

## 2019-08-05 NOTE — Progress Notes (Signed)
    SUBJECTIVE:   CHIEF COMPLAINT / HPI:  [facility] referral Patient presents today because he needs a referral to the [facility] to have his prosthesis refit.  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  Patient reports that his old prosthesis no longer fits to the extent that he cannot even wear it anymore at this time.  He would like to be able to wear his prosthesis so that he is able to walk around rather than being wheelchair-bound.  He says that the prosthesis does not fit because his leg has gotten too big to fit into the amount on the prosthesis.  Patient also reports that when he tries to wear the prosthesis he has pain from compression and poor fit.  Smoking Patient reports he is still smoking half a pack a day.  He reports he is going to stop smoking "today".  He does not want any medication help at this time to help with smoking cessation.    OBJECTIVE:   BP 128/74   Pulse 92   Ht 5\' 3"  (1.6 m)   SpO2 97%   BMI 21.71 kg/m   General: Well-appearing, no acute distress Cardio: Regular rate and rhythm, no murmurs appreciated Respiratory: Normal work of breathing, speaking clear sentences, clear to auscultation bilaterally Abdomen: Soft, nontender, positive bowel sounds MSK: Patient has the left BKA.  No sores noted.  Patient reports tenderness to palpation in irregular locations around the lower extremity.  Patient is unable to ambulate due to not being able to wear his prosthesis.  ASSESSMENT/PLAN:   Below-knee amputation of left lower extremity [facility]) Patient requesting referral to [facility] for refitting of his prosthesis.  He is unable to wear his prosthesis at this time because his leg is too large to fit in the prosthesis.  This limits him to be wheelchair-bound rather than being able to ambulate. -Evaluation completed -Written referral given to patient's daughter -Patient's daughter reports she will  come and pick up the clinic note to provide to the [facility] early next week  Tobacco abuse Discussed smoking cessation with the patient at this visit.  He reports he is going to quit smoking "today". -Continue to monitor smoking status -Continue to offer assistance with smoking cessation if desired     [facility], MD [facility]

## 2019-08-06 NOTE — Assessment & Plan Note (Signed)
Discussed smoking cessation with the patient at this visit.  He reports he is going to quit smoking "today". -Continue to monitor smoking status -Continue to offer assistance with smoking cessation if desired

## 2019-08-06 NOTE — Assessment & Plan Note (Signed)
Patient requesting referral to [facility] for refitting of his prosthesis.  He is unable to wear his prosthesis at this time because his leg is too large to fit in the prosthesis.  This limits him to be wheelchair-bound rather than being able to ambulate. -Evaluation completed -Written referral given to patient's daughter -Patient's daughter reports she will come and pick up the clinic note to provide to the [facility] early next week

## 2019-08-11 ENCOUNTER — Telehealth: Payer: Self-pay | Admitting: Family Medicine

## 2019-08-11 NOTE — Telephone Encounter (Signed)
Received a call asking if the paper for [facility] is ready for pick and I could not find it in the folder. Was wondering if Dr. Jerilynn Som had forms if so I can call and let them know. Thanks

## 2019-08-12 NOTE — Telephone Encounter (Signed)
Patient will just need his last clinic note to take to the [facility].  I have already filled out a prescription for him to go to the [place] clinic to be fitted.  He should not need any other documentation.

## 2019-08-25 NOTE — Progress Notes (Signed)
[facility] Note  08/31/2019     CHIEF COMPLAINT Patient presents for Retina Evaluation   HISTORY OF PRESENT ILLNESS: Kristopher Ruiz. is a 69 y.o. male who presents to the clinic today for:   HPI    Retina Evaluation    In both eyes.  Onset: Unknown.  Duration of 4 weeks.  Associated Symptoms Floaters.  Context:  distance vision, mid-range vision and near vision.  Treatments tried include eye drops.  Response to treatment was no improvement.  I, the attending physician,  performed the HPI with the patient and updated documentation appropriately.          Comments    70 y/o male pt referred by Dr. Shirley Muscat on 08/02/19 for eval of poss. Schisis @ 1:00 OD and 2 ret scars at 3:00 OS.  VA blurred OU x several mos.  Denies pain, FOL, but has a few floaters OU.  OD aches from time to time.  Restasis and Olopatadine BID OU.       Last edited by Bernarda Caffey, MD on 08/31/2019  2:57 PM. (History)    pt is here on the referral of Dr. Garlan Fillers for scars in the back of his eye, pt states Dr. 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 olopatadine (PATANOL) 0.1 % ophthalmic solution   . RESTASIS 0.05 % ophthalmic emulsion    No current facility-administered medications for this visit. (Ophthalmic Drugs)   Current Outpatient Medications (Other)  Medication Sig  . acetaminophen (TYLENOL) 500 MG tablet Take 1,000 mg by mouth every 6 (six) hours as needed for mild pain or headache.  Marland Kitchen amLODipine (NORVASC)  10 MG tablet Take 10 mg by mouth daily.  Marland Kitchen aspirin 81 MG EC tablet Take 81 mg by mouth daily.  Marland Kitchen aspirin EC 325 MG EC tablet Take 1 tablet (325 mg total) by mouth daily.  . hydrochlorothiazide (HYDRODIURIL) 25 MG tablet Take 25 mg by mouth every morning.  Marland Kitchen ketorolac (TORADOL) 10 MG tablet Take 10 mg by mouth every 8 (eight) hours as needed.  . lidocaine (LIDODERM) 5 % Place 1 patch onto the skin daily. Remove & Discard patch within 12 hours or as directed by MD  . nicotine (NICODERM CQ - DOSED IN MG/24 HR) 7 mg/24hr patch Place 1 patch (7 mg total) onto the skin daily.  Marland Kitchen atorvastatin (LIPITOR) 80 MG tablet Take 1 tablet (80 mg total) by mouth daily for 30 days.  Marland Kitchen diltiazem (TIAZAC) 120 MG 24 hr capsule Take 1 capsule (120 mg total) by mouth daily.  Marland Kitchen gabapentin (NEURONTIN) 100 MG capsule Take 1 capsule (100 mg total) by mouth 3 (three) times daily.  . metoprolol succinate (TOPROL-XL) 25 MG 24 hr tablet Take 1 tablet (25 mg total) by mouth daily. Take with or immediately following a meal.  . omeprazole (PRILOSEC) 40 MG capsule Take 1 capsule (40 mg total) by mouth daily.   No current facility-administered medications for this visit. (Other)      REVIEW OF  SYSTEMS: ROS    Positive for: Neurological, Musculoskeletal, Eyes   Negative for: Constitutional, Gastrointestinal, Skin, Genitourinary, HENT, Endocrine, Cardiovascular, Respiratory, Psychiatric, Allergic/Imm, Heme/Lymph   Last edited by Matthew Folks, COA on 08/31/2019  2:19 PM. (History)       ALLERGIES Allergies  Allergen Reactions  . Ibuprofen Other (See Comments)    GI Problems    PAST MEDICAL HISTORY Past Medical History:  Diagnosis Date  . Cataract    NS OU  . Hypertension    Past Surgical History:  Procedure Laterality Date  . HIP FRACTURE SURGERY    . LEG AMPUTATION BELOW KNEE      FAMILY HISTORY Family History  Problem Relation Age of Onset  . Hypertension Mother   . Hypertension Father     SOCIAL  HISTORY Social History   Tobacco Use  . Smoking status: Current Every Day Smoker    Types: Cigarettes  . Smokeless tobacco: Never Used  Substance Use Topics  . Alcohol use: No  . Drug use: No         OPHTHALMIC EXAM:  Base Eye Exam    Visual Acuity (Snellen - Linear)      Right Left   Dist cc 20/60 20/80   Dist ph cc NI NI   Correction: Glasses  Claims he sees double when testing OS only       Tonometry (Tonopen, 2:21 PM)      Right Left   Pressure 15 18       Pupils      Dark Light Shape React APD   Right 3 2.5 Round Minimal None   Left 5 4.5 Round Minimal None       Visual Fields (Counting fingers)      Left Right    Full Full       Extraocular Movement      Right Left    Full, Ortho Full, Ortho       Neuro/Psych    Oriented x3: Yes   Mood/Affect: Normal       Dilation    Both eyes: 1.0% Mydriacyl, 2.5% Phenylephrine @ 2:21 PM        Slit Lamp and Fundus Exam    Slit Lamp Exam      Right Left   Lids/Lashes Dermatochalasis - upper lid Dermatochalasis - upper lid   Conjunctiva/Sclera Mild nasal and temporal Pinguecula, mild Melanosis Mild nasal and temporal Pinguecula, Melanosis   Cornea Arcus, 1+ Punctate epithelial erosions Arcus, trace Punctate epithelial erosions   Anterior Chamber Deep and quiet Deep and quiet   Iris Round and dilated Round and dilated   Lens 2+ Nuclear sclerosis, 2+ Cortical cataract, +Vacuoles 2+ Nuclear sclerosis, 2+ Cortical cataract, +Vacuoles   Vitreous Vitreous syneresis, Posterior vitreous detachment, vitreous condensations Vitreous syneresis       Fundus Exam      Right Left   Disc Pink and Sharp, temporal PPA Pink and Sharp, temporal PPA, +cupping   C/D Ratio 0.6 0.65   Macula Flat, Blunted foveal reflex, Retinal pigment epithelial mottling, No heme or edema Flat, Blunted foveal reflex, Retinal pigment epithelial mottling, No heme or edema   Vessels Vascular attenuation Vascular attenuation   Periphery  Attached, No heme  Attached, mild White without pressure at 0130, focal perivascular pigment clumping at 0300 and 0600, no heme        Refraction    Wearing Rx      Sphere Cylinder Axis Add   Right -  0.50 +1.25 170 +2.50   Left -1.00 +1.50 025 +2.50   Age: 18yr  Type: PAL       Manifest Refraction      Sphere Cylinder Axis Dist VA   Right -0.25 +1.75 170 20/50   Left [place] +1.25 015 20/50          IMAGING AND PROCEDURES  Imaging and Procedures for _0 @  OCT, Retina - OU - Both Eyes       Right Eye Quality was good. Central Foveal Thickness: 252. Progression has no prior data. Findings include normal foveal contour, no IRF, no SRF, vitreomacular adhesion .   Left Eye Quality was good. Central Foveal Thickness: 250. Progression has no prior data. Findings include normal foveal contour, no IRF, no SRF (Mild ERM).   Notes *Images captured and stored on drive  Diagnosis / Impression:  NFP, no IRF/SRF OU  Clinical management:  See below  Abbreviations: NFP - Normal foveal profile. CME - cystoid macular edema. PED - pigment epithelial detachment. IRF - intraretinal fluid. SRF - subretinal fluid. EZ - ellipsoid zone. ERM - epiretinal membrane. ORA - outer retinal atrophy. ORT - outer retinal tubulation. SRHM - subretinal hyper-reflective material        Fluorescein Angiography Optos (Transit OS)       Right Eye   Progression has no prior data. Early phase findings include normal observations. Mid/Late phase findings include normal observations.   Left Eye   Progression has no prior data. Early phase findings include vascular perfusion defect, delayed filling, staining (Delayed venous return). Mid/Late phase findings include staining, blockage, vascular perfusion defect.   Notes **Images stored on drive**  Impression: OD: normal study OS: peripheral retinal ischemia temporal periphery with blockage and staining                   ASSESSMENT/PLAN:    ICD-10-CM   1. 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 Retinal edema  H35.81 OCT, Retina - OU - Both Eyes  4. Hypertensive retinopathy of both eyes  H35.033 Fluorescein Angiography Optos (Transit OS)  5. Essential hypertension  I10   6. Combined forms of age-related cataract of both eyes  H25.813    1,2. Retinal ischemia with focal, peripheral pigmentary retinopathy  - perivascular pigment clumping temporal and inferior periphery (3 and 6 oclock)  - fluorescein angiography 6.2.21 shows peripheral vascular defect distal to perivascular; no leakage or neovascularization  - discussed findings  - no acute intervention indicated or recommended  - monitor  3. No retinal edema on exam or OCT  4,5. Hypertensive retinopathy OU  - discussed importance of tight BP control  - monitor  6. Mixed cataracts OU   - The symptoms of cataract, surgical options, and treatments and risks were discussed with patient.  - discussed diagnosis and progression  - not yet visually significant  - monitor for now    Ophthalmic Meds Ordered this visit:  No orders of the defined types were placed in this encounter.      Return for 6-8 wks, Dilated Exam, OCT.  There are no Patient Instructions on file for this visit.   Explained the diagnoses, plan, and follow up with the patient and they expressed understanding.  Patient expressed understanding of the importance of proper follow up care.   This document serves as a record of services personally performed by BGardiner Sleeper MD, PhD. It was created on their behalf  by Ernest Mallick, OA, an ophthalmic assistant. The creation of this record is the provider's dictation and/or activities during the visit.    Electronically signed by: Ernest Mallick, OA 06.02.2021 11:19 PM   Gardiner Sleeper, M.D., Ph.D. Diseases & Surgery of the Retina and Vitreous [facility]  I have reviewed the above  documentation for accuracy and completeness, and I agree with the above. Gardiner Sleeper, M.D., Ph.D. 09/02/19 11:19 PM   Abbreviations: M myopia (nearsighted); A astigmatism; H hyperopia (farsighted); P presbyopia; Mrx spectacle prescription;  CTL contact lenses; OD right eye; OS left eye; OU both eyes  XT exotropia; ET esotropia; PEK punctate epithelial keratitis; PEE punctate epithelial erosions; DES dry eye syndrome; MGD meibomian gland dysfunction; ATs artificial tears; PFAT's preservative free artificial tears; [place] nuclear sclerotic cataract; PSC posterior subcapsular cataract; ERM epi-retinal membrane; PVD posterior vitreous detachment; RD retinal detachment; DM diabetes mellitus; DR diabetic retinopathy; NPDR non-proliferative diabetic retinopathy; PDR proliferative diabetic retinopathy; CSME clinically significant macular edema; DME diabetic macular edema; dbh dot blot hemorrhages; CWS cotton wool spot; POAG primary open angle glaucoma; C/D cup-to-disc ratio; HVF humphrey visual field; GVF goldmann visual field; OCT optical coherence tomography; IOP intraocular pressure; BRVO Branch retinal vein occlusion; CRVO central retinal vein occlusion; CRAO central retinal artery occlusion; BRAO branch retinal artery occlusion; RT retinal tear; SB scleral buckle; PPV pars plana vitrectomy; VH Vitreous hemorrhage; PRP panretinal laser photocoagulation; IVK intravitreal kenalog; VMT vitreomacular traction; MH Macular hole;  NVD neovascularization of the disc; NVE neovascularization elsewhere; AREDS age related eye disease study; ARMD age related macular degeneration; POAG primary open angle glaucoma; EBMD epithelial/anterior basement membrane dystrophy; ACIOL anterior chamber intraocular lens; IOL intraocular lens; PCIOL posterior chamber intraocular lens; Phaco/IOL phacoemulsification with intraocular lens placement; [place] photorefractive keratectomy; LASIK laser assisted in situ keratomileusis; HTN hypertension; DM  diabetes mellitus; COPD chronic obstructive pulmonary disease

## 2019-08-31 ENCOUNTER — Encounter (INDEPENDENT_AMBULATORY_CARE_PROVIDER_SITE_OTHER): Payer: Self-pay | Admitting: Ophthalmology

## 2019-08-31 ENCOUNTER — Ambulatory Visit (INDEPENDENT_AMBULATORY_CARE_PROVIDER_SITE_OTHER): Payer: Medicare Other | Admitting: Ophthalmology

## 2019-08-31 ENCOUNTER — Other Ambulatory Visit: Payer: Self-pay

## 2019-08-31 DIAGNOSIS — H25813 Combined forms of age-related cataract, bilateral: Secondary | ICD-10-CM

## 2019-08-31 DIAGNOSIS — H35033 Hypertensive retinopathy, bilateral: Secondary | ICD-10-CM

## 2019-08-31 DIAGNOSIS — H3581 Retinal edema: Secondary | ICD-10-CM | POA: Diagnosis not present

## 2019-08-31 DIAGNOSIS — H3552 Pigmentary retinal dystrophy: Secondary | ICD-10-CM

## 2019-08-31 DIAGNOSIS — I10 Essential (primary) hypertension: Secondary | ICD-10-CM

## 2019-08-31 DIAGNOSIS — H3582 Retinal ischemia: Secondary | ICD-10-CM

## 2019-09-01 ENCOUNTER — Ambulatory Visit: Payer: Medicare Other | Admitting: Orthopedic Surgery

## 2019-09-01 ENCOUNTER — Ambulatory Visit (INDEPENDENT_AMBULATORY_CARE_PROVIDER_SITE_OTHER): Payer: Medicare Other | Admitting: Orthopedic Surgery

## 2019-09-01 ENCOUNTER — Ambulatory Visit: Payer: Self-pay

## 2019-09-01 ENCOUNTER — Encounter: Payer: Self-pay | Admitting: Orthopedic Surgery

## 2019-09-01 VITALS — Ht 63.0 in | Wt 122.6 lb

## 2019-09-01 DIAGNOSIS — M25559 Pain in unspecified hip: Secondary | ICD-10-CM

## 2019-09-01 DIAGNOSIS — S72002A Fracture of unspecified part of neck of left femur, initial encounter for closed fracture: Secondary | ICD-10-CM | POA: Diagnosis not present

## 2019-09-01 DIAGNOSIS — M25552 Pain in left hip: Secondary | ICD-10-CM

## 2019-09-05 ENCOUNTER — Encounter: Payer: Self-pay | Admitting: Orthopedic Surgery

## 2019-09-05 NOTE — Progress Notes (Signed)
Office Visit Note   Patient: Kristopher Ruiz.           Date of Birth: Nov 13, 1950           MRN: [id number] Visit Date: 09/01/2019              Requested by: Gifford Shave, MD [address],  [place] [address] PCP: Gifford Shave, MD  Chief Complaint  Patient presents with  . Left Leg - Follow-up    Prosthesis evaluation; RX needed for [facility]  . Left Hip - Pain, Injury      HPI: Patient is a 69 year old gentleman who is seen for 2 separate issues.  Patient has decreased residual volume in the left transtibial amputation has a poor fitting socket.  #2 patient states he fell about 3 months ago on his left hip and has been having persistent pain.  Assessment & Plan: Visit Diagnoses:  1. Hip pain   2. Closed fracture of neck of left femur, initial encounter ([place])     Plan: A prescription was written for [facility] for new socket new liner materials and supplies.  A referral is made to Dr. Ninfa Linden for evaluation for total hip arthroplasty on the left.  Follow-Up Instructions: Return in about 2 weeks (around 09/15/2019).   Ortho Exam  Patient is alert, oriented, no adenopathy, well-dressed, normal affect, normal respiratory effort. Examination patient has decreased range of motion of the left hip radiograph shows a chronically impacted and displaced femoral neck fracture.  Patient has decreased volume in the residual limb with an bearing pressure at risk for skin breakdown.  Imaging: No results found. No images are attached to the encounter.  Labs: Lab Results  Component Value Date   HGBA1C 6.4 (H) 09/14/2018   HGBA1C 6.2 (H) 09/07/2018     Lab Results  Component Value Date   ALBUMIN 3.6 09/13/2018    No results found for: MG No results found for: VD25OH  No results found for: PREALBUMIN CBC EXTENDED Latest Ref Rng & Units 09/13/2018 09/13/2018 09/06/2018  WBC 4.0 - 10.5 K/uL 7.9 7.2 9.0  RBC 4.22 - 5.81 MIL/uL 4.78 4.49 5.23  HGB 13.0 - 17.0 g/dL 13.1  12.1(L) 14.3  HCT 39.0 - 52.0 % 39.7 37.9(L) 43.2  PLT 150 - 400 K/uL 199 187 232  NEUTROABS 1.7 - 7.7 K/uL - 4.0 6.5  LYMPHSABS 0.7 - 4.0 K/uL - 2.5 1.8     Body mass index is 21.71 kg/m.  Orders:  Orders Placed This Encounter  Procedures  . XR HIP UNILAT W OR W/O PELVIS 2-3 VIEWS LEFT   No orders of the defined types were placed in this encounter.    Procedures: No procedures performed  Clinical Data: No additional findings.  ROS:  All other systems negative, except as noted in the HPI. Review of Systems  Objective: Vital Signs: Ht 5\' 3"  (1.6 m)   Wt 122 lb 9.3 oz (55.6 kg)   BMI 21.71 kg/m   Specialty Comments:  No specialty comments available.  PMFS History: Patient Active Problem List   Diagnosis Date Noted  . Establishing care with new doctor, encounter for 06/01/2019  . TIA (transient ischemic attack) 09/13/2018  . Cerebellar stroke ([place]) 09/06/2018  . Hypoglycemia due to insulin 09/06/2018  . Tobacco abuse 09/06/2018  . Hyperlipidemia 09/06/2018  . Essential hypertension 09/06/2018  . 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[place]) 04/16/2017   Past Medical History:  Diagnosis Date  . Cataract  NS OU  . Hypertension     Family History  Problem Relation Age of Onset  . Hypertension Mother   . Hypertension Father     Past Surgical History:  Procedure Laterality Date  . HIP FRACTURE SURGERY    . LEG AMPUTATION BELOW KNEE     Social History   Occupational History  . Not on file  Tobacco Use  . Smoking status: Current Every Day Smoker    Types: Cigarettes  . Smokeless tobacco: Never Used  Substance and Sexual Activity  . Alcohol use: No  . Drug use: No  . Sexual activity: Not on file

## 2019-09-13 NOTE — Telephone Encounter (Signed)
Brooke, Can I print this?  Please advise.  Sunday Spillers, CMA

## 2019-09-21 ENCOUNTER — Encounter: Payer: Self-pay | Admitting: Orthopaedic Surgery

## 2019-09-21 ENCOUNTER — Other Ambulatory Visit: Payer: Self-pay

## 2019-09-21 ENCOUNTER — Ambulatory Visit (INDEPENDENT_AMBULATORY_CARE_PROVIDER_SITE_OTHER): Payer: Medicare Other | Admitting: Orthopaedic Surgery

## 2019-09-21 VITALS — Ht 63.0 in | Wt 122.0 lb

## 2019-09-21 DIAGNOSIS — S72002G Fracture of unspecified part of neck of left femur, subsequent encounter for closed fracture with delayed healing: Secondary | ICD-10-CM

## 2019-09-21 MED ORDER — ACETAMINOPHEN-CODEINE #3 300-30 MG PO TABS: 1.0000 | ORAL_TABLET | Freq: Three times a day (TID) | ORAL | 0 refills | Status: DC | PRN

## 2019-09-21 NOTE — Progress Notes (Signed)
The patient is sent by Dr. Lajoyce Corners for evaluation treatment of a subacute left hip fracture.  The patient has a history of a mechanical fall back in January of this year.  He injured his left hip at that time.  He did not see Dr. Lajoyce Corners until earlier this month.  X-rays showed a displaced femoral neck fracture with shortening as well.  He does have a history of a previous right intertrochanteric fracture that was fixed remotely.  He ambulates with a prosthesis but he states that his left BKA prosthesis no longer fits and is too small.  He mainly gets around in a wheelchair.  He does have significant pain as it relates to his left chronically broken hip.  On exam I can put his left hip through internal and external rotation but it is painful for him.  His residual below-knee amputation stump is healed and there is no wounds that I can see on his leg at all.  I did review the x-rays with him and he does have a displaced left hip femoral neck fracture with shortening.  From my standpoint, I would not be able to perform direct anterior hip replacement surgery.  I would not be able to pull traction enough through his residual below-knee amputation stump to successfully get to his hip.  Given the fact that he does not have a prosthesis that fits I cannot have him wear his prosthesis to pull traction through this.  I did give him a prescription for a wheelchair.  I will send in some medication for Tylenol 3 for his pain.  I would like to at least have him seen and evaluated by Dr. Roda Shutters to see if he would be a candidate for at least posterior hip replacement surgery which is something I do not do.  I did not give him any guarantees to Dr. Roda Shutters would be able to perform any type of surgery on him.  They agree with that referral.

## 2019-09-28 ENCOUNTER — Encounter: Payer: Self-pay | Admitting: Orthopaedic Surgery

## 2019-09-28 ENCOUNTER — Other Ambulatory Visit: Payer: Self-pay

## 2019-09-28 ENCOUNTER — Ambulatory Visit (INDEPENDENT_AMBULATORY_CARE_PROVIDER_SITE_OTHER): Payer: Medicare Other | Admitting: Orthopaedic Surgery

## 2019-09-28 DIAGNOSIS — S72002G Fracture of unspecified part of neck of left femur, subsequent encounter for closed fracture with delayed healing: Secondary | ICD-10-CM

## 2019-09-28 MED ORDER — HYDROCODONE-ACETAMINOPHEN 5-325 MG PO TABS: 1.0000 | ORAL_TABLET | Freq: Every day | ORAL | 0 refills | Status: DC | PRN

## 2019-09-28 NOTE — Progress Notes (Signed)
Office Visit Note   Patient: Kristopher Ruiz.           Date of Birth: April 07, 1950           MRN: [id number] Visit Date: 09/28/2019              Requested by: Derrel Nip, MD [address] [place],  [place] [address] PCP: Derrel Nip, MD   Assessment & Plan: Visit Diagnoses:  1. 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  I have reviewed the x-rays with the patient and I do feel that I will be able to perform a total hip replacement through a posterior approach.  We discussed that postoperatively the sooner he can get a well fitting prosthetic leg the quicker his recovery and rehab will be.  He feels that he should be able to use his current prosthetic leg at least for ambulating short distances.  In the meantime his significant other will work on getting a new prosthetic leg/socket.  We will contact the patient to schedule surgery in the near future.  Small supply of hydrocodone was prescribed today.  Questions encouraged and answered.  Risk benefits rehab recovery of the surgery discussed in detail today. Total face to face encounter time was greater than 25 minutes and over half of this time was spent in counseling and/or coordination of care.  Follow-Up Instructions: Return if symptoms worsen or fail to improve.   Orders:  No orders of the defined types were placed in this encounter.  Meds ordered this encounter  Medications   HYDROcodone-acetaminophen (NORCO) 5-325 MG tablet    Sig: Take 1-2 tablets by mouth daily as needed.    Dispense:  30 tablet    Refill:  0      Procedures: No procedures performed   Clinical Data: No additional findings.   Subjective: Chief Complaint  Patient presents with   Left Hip - Pain    Mr. Katzenstein is a referral from Dr. 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  Given the fact that he has a poor fitting prosthetic leg Dr. Magnus Ivan did not feel that he could use the prosthesis as a way to pull traction on the Hana bed therefore patient has been referred to me for consideration of a total hip replacement through a posterior approach.  Denies any changes in medical history.   Review of Systems  Constitutional: Negative.   All other systems reviewed and are negative.    Objective: Vital Signs: There were no vitals taken for this visit.  Physical Exam Vitals and nursing note reviewed.  Constitutional:      Appearance: He is well-developed.  Pulmonary:     Effort: Pulmonary effort is normal.  Abdominal:     Palpations: Abdomen is soft.  Skin:    General: Skin is warm.  Neurological:     Mental Status: He is alert and oriented to person, place, and time.  Psychiatric:        Behavior: Behavior normal.        Thought Content: Thought content normal.        Judgment: Judgment normal.     Ortho Exam Left hip shows painful range of motion with guarding.  He is sitting in wheelchair in no acute distress. Specialty Comments:  No specialty comments available.  Imaging: No results found.   PMFS History: Patient Active Problem List   Diagnosis Date Noted   Closed displaced fracture of left femoral neck with delayed healing 09/28/2019   Establishing care with new doctor, encounter for 06/01/2019   TIA (transient ischemic attack) 09/13/2018   Cerebellar stroke (HCC) 09/06/2018   Hypoglycemia due to insulin 09/06/2018   Tobacco abuse 09/06/2018   Hyperlipidemia 09/06/2018   Essential hypertension 09/06/2018   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HCC) 04/16/2017   Past Medical History:  Diagnosis Date   Cataract    NS OU   Hypertension     Family History  Problem Relation Age of Onset   Hypertension Mother     Hypertension Father     Past Surgical History:  Procedure Laterality Date   HIP FRACTURE SURGERY     LEG AMPUTATION BELOW KNEE     Social History   Occupational History   Not on file  Tobacco Use   Smoking status: Current Every Day Smoker    Types: Cigarettes   Smokeless tobacco: Never Used  Substance and Sexual Activity   Alcohol use: No   Drug use: No   Sexual activity: Not on file

## 2019-10-07 NOTE — Progress Notes (Signed)
[facility] - Clinic Note  10/10/2019     CHIEF COMPLAINT Patient presents for Retina Follow Up   HISTORY OF PRESENT ILLNESS: Kristopher Ruiz. is a 69 y.o. male who presents to the clinic today for:   HPI    Retina Follow Up    Patient presents with  Other.  In left eye.  I, the attending physician,  performed the HPI with the patient and updated documentation appropriately.          Comments    5 1/2 week follow up Retinal ischemia OS-  Vision is still blurry OS but now worse.       Last edited by Rennis [name], MD on 10/10/2019  3:47 PM. (History)    pt feels like his left eye vision is worse today, but he tested better on the eye chart  Referring physician: Francene Boyers, MD [address] 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 olopatadine (PATANOL) 0.1 % ophthalmic solution   . RESTASIS 0.05 % ophthalmic emulsion    No current facility-administered medications for this visit. (Ophthalmic Drugs)   Current Outpatient Medications (Other)  Medication Sig  . acetaminophen (TYLENOL) 500 MG tablet Take 1,000 mg by mouth every 6 (six) hours as needed for mild pain or headache.  Marland Kitchen acetaminophen-codeine (TYLENOL #3) 300-30 MG tablet Take 1-2 tablets by mouth every 8 (eight) hours as needed for moderate pain.  Marland Kitchen amLODipine (NORVASC) 10 MG tablet Take 10 mg by mouth daily.  Marland Kitchen aspirin 81 MG EC tablet Take 81 mg by mouth daily.  Marland Kitchen aspirin EC 325 MG EC tablet Take 1 tablet (325 mg total) by mouth daily.  Marland Kitchen atorvastatin (LIPITOR) 80 MG tablet Take 1 tablet (80 mg total) by mouth daily for 30 days.  Marland Kitchen diltiazem (TIAZAC) 120 MG 24 hr capsule Take 1 capsule (120 mg total) by mouth daily.  Marland Kitchen gabapentin (NEURONTIN) 100 MG capsule Take 1 capsule (100 mg  total) by mouth 3 (three) times daily.  . hydrochlorothiazide (HYDRODIURIL) 25 MG tablet Take 25 mg by mouth every morning.  Marland Kitchen HYDROcodone-acetaminophen (NORCO) 5-325 MG tablet Take 1-2 tablets by mouth daily as needed.  Marland Kitchen ketorolac (TORADOL) 10 MG tablet Take 10 mg by mouth every 8 (eight) hours as needed.  . lidocaine (LIDODERM) 5 % Place 1 patch onto the skin daily. Remove & Discard patch within 12 hours or as directed by MD  . metoprolol succinate (TOPROL-XL) 25 MG 24 hr tablet Take 1 tablet (25 mg total) by mouth daily. Take with or immediately following a meal.  . nicotine (NICODERM CQ - DOSED IN MG/24 HR) 7 mg/24hr patch Place 1 patch (7 mg total) onto the skin daily.  Marland Kitchen omeprazole (PRILOSEC) 40 MG capsule Take 1 capsule (40 mg total) by mouth daily.   No current facility-administered medications for this visit. (Other)      REVIEW OF SYSTEMS: ROS    Positive for: Neurological, Musculoskeletal, Eyes   Negative for: Constitutional, Gastrointestinal, Skin, Genitourinary, HENT, Endocrine, Cardiovascular, Respiratory, Psychiatric, Allergic/Imm, Heme/Lymph   Last edited by Joni Reining, COA on 10/10/2019  3:32 PM. (History)       ALLERGIES Allergies  Allergen Reactions  . Ibuprofen Other (See Comments)    GI Problems    PAST MEDICAL HISTORY Past Medical  History:  Diagnosis Date  . Cataract    NS OU  . Hypertension    Past Surgical History:  Procedure Laterality Date  . HIP FRACTURE SURGERY    . LEG AMPUTATION BELOW KNEE      FAMILY HISTORY Family History  Problem Relation Age of Onset  . Hypertension Mother   . Hypertension Father     SOCIAL HISTORY Social History   Tobacco Use  . Smoking status: Current Every Day Smoker    Types: Cigarettes  . Smokeless tobacco: Never Used  Substance Use Topics  . Alcohol use: No  . Drug use: No         OPHTHALMIC EXAM:  Base Eye Exam    Visual Acuity (Snellen - Linear)      Right Left   Dist [place] 20/50 -1 20/50  -2   Dist ph [facility] NI NI       Tonometry (Tonopen, 3:38 PM)      Right Left   Pressure 12 11       Pupils      Dark Light Shape React APD   Right 2 1 Round Minimal None   Left 5 4.5 Irregular Minimal None       Visual Fields (Counting fingers)      Left Right    Full Full       Extraocular Movement      Right Left    Full Full       Neuro/Psych    Oriented x3: Yes   Mood/Affect: Normal       Dilation    Both eyes: 1.0% Mydriacyl, 2.5% Phenylephrine @ 3:38 PM       Dilation #2    Both eyes: 1.0% Mydriacyl, 2.5% Phenylephrine @ 3:53 PM        Slit Lamp and Fundus Exam    Slit Lamp Exam      Right Left   Lids/Lashes Dermatochalasis - upper lid Dermatochalasis - upper lid   Conjunctiva/Sclera Mild nasal and temporal Pinguecula, mild Melanosis, 1+ Injection Mild nasal and temporal Pinguecula, Melanosis, Trace Injection   Cornea Arcus, mild Debris in tear film Arcus, trace Punctate epithelial erosions, trace Debris in tear film   Anterior Chamber Deep and quiet Deep and quiet   Iris Round and poorly dilated, trace Iris atrophy Round and moderately dilated, trace Iris atrophy   Lens 2+ Nuclear sclerosis, 2+ Cortical cataract, +Vacuoles 2+ Nuclear sclerosis, 2+ Cortical cataract, +Vacuoles   Vitreous Vitreous syneresis, Posterior vitreous detachment, vitreous condensations Vitreous syneresis       Fundus Exam      Right Left   Disc Pink and Sharp, temporal PPA Pink and Sharp, temporal PPA, +cupping   C/D Ratio 0.6 0.65   Macula Flat, blunted foveal reflex, Retinal pigment epithelial mottling, No heme or edema Flat, Blunted foveal reflex, Retinal pigment epithelial mottling, mild ERM, No heme or edema   Vessels Vascular attenuation Vascular attenuation, mild Copper wiring, mild tortuousity   Periphery Attached, focal RPE hyperpigmentation 1100 equator, No heme  Attached, mild White without pressure at 0130, focal perivascular pigment clumping at 0300 and 0600 - no change  from prior, no heme          IMAGING AND PROCEDURES  Imaging and Procedures for @TODAY @  OCT, Retina - OU - Both Eyes       Right Eye Quality was good. Central Foveal Thickness: 247. Progression has been stable. Findings include normal foveal contour, no IRF, no  SRF, vitreomacular adhesion .   Left Eye Quality was good. Central Foveal Thickness: 252. Progression has been stable. Findings include normal foveal contour, no IRF, no SRF (Mild ERM).   Notes *Images captured and stored on drive  Diagnosis / Impression:  NFP, no IRF/SRF OU  Clinical management:  See below  Abbreviations: NFP - Normal foveal profile. CME - cystoid macular edema. PED - pigment epithelial detachment. IRF - intraretinal fluid. SRF - subretinal fluid. EZ - ellipsoid zone. ERM - epiretinal membrane. ORA - outer retinal atrophy. ORT - outer retinal tubulation. SRHM - subretinal hyper-reflective material                 ASSESSMENT/PLAN:    ICD-10-CM   1. 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 Retinal edema  H35.81 OCT, Retina - OU - Both Eyes  4. Essential hypertension  I10   5. Hypertensive retinopathy of both eyes  H35.033   6. Combined forms of age-related cataract of both eyes  H25.813    1,2. Retinal ischemia with focal, peripheral pigmentary retinopathy -- stable  - perivascular pigment clumping temporal and inferior periphery (3 and 6 oclock)  - fluorescein angiography 6.2.21 shows peripheral vascular defect distal to perivascular pigmentation; no leakage or neovascularization  - discussed findings  - no acute intervention indicated or recommended  - f/u 6 months, DFE, OCT  3. No retinal edema on exam or OCT  4,5. Hypertensive retinopathy OU  - discussed importance of tight BP control  - monitor  6. Mixed cataracts OU   - The symptoms of cataract, surgical options, and treatments and risks were discussed with patient.  - discussed diagnosis and  progression  - suspect most of his visual symptoms related to cataract  - may benefit from cataract surgery / consult  - management per Dr. Hanley Seamen      Ophthalmic Meds Ordered this visit:  No orders of the defined types were placed in this encounter.      Return in about 6 months (around 04/11/2020) for f/u retinal ischemia OS, DFE, OCT.  There are no Patient Instructions on file for this visit.   Explained the diagnoses, plan, and follow up with the patient and they expressed understanding.  Patient expressed understanding of the importance of proper follow up care.   This document serves as a record of services personally performed by Karie Chimera, MD, PhD. It was created on their behalf by Joni Reining, an ophthalmic technician. The creation of this record is the provider's dictation and/or activities during the visit.    Electronically signed by: Joni Reining COA, 10/10/19  4:50 PM   This document serves as a record of services personally performed by Karie Chimera, MD, PhD. It was created on their behalf by Glee Arvin. Manson Passey, OA an ophthalmic technician. The creation of this record is the provider's dictation and/or activities during the visit.    Electronically signed by: Glee Arvin. Kristopher Oppenheim 07.12.2021 4:50 PM  Karie Chimera, M.D., Ph.D. Diseases & Surgery of the Retina and Vitreous [facility]  I have reviewed the above documentation for accuracy and completeness, and I agree with the above. Karie Chimera, M.D., Ph.D. 10/10/19 4:50 PM   Abbreviations: M myopia (nearsighted); A astigmatism; H hyperopia (farsighted); P presbyopia; Mrx spectacle prescription;  CTL contact lenses; OD right eye; OS left eye; OU both eyes  XT exotropia; ET esotropia; PEK punctate epithelial  keratitis; PEE punctate epithelial erosions; DES dry eye syndrome; MGD meibomian gland dysfunction; ATs artificial tears; PFAT's preservative free artificial tears; NSC nuclear  sclerotic cataract; PSC posterior subcapsular cataract; ERM epi-retinal membrane; PVD posterior vitreous detachment; RD retinal detachment; DM diabetes mellitus; DR diabetic retinopathy; NPDR non-proliferative diabetic retinopathy; PDR proliferative diabetic retinopathy; CSME clinically significant macular edema; DME diabetic macular edema; dbh dot blot hemorrhages; CWS cotton wool spot; POAG primary open angle glaucoma; C/D cup-to-disc ratio; HVF humphrey visual field; GVF goldmann visual field; OCT optical coherence tomography; IOP intraocular pressure; BRVO Branch retinal vein occlusion; CRVO central retinal vein occlusion; CRAO central retinal artery occlusion; BRAO branch retinal artery occlusion; RT retinal tear; SB scleral buckle; PPV pars plana vitrectomy; VH Vitreous hemorrhage; PRP panretinal laser photocoagulation; IVK intravitreal kenalog; VMT vitreomacular traction; MH Macular hole;  NVD neovascularization of the disc; NVE neovascularization elsewhere; AREDS age related eye disease study; ARMD age related macular degeneration; POAG primary open angle glaucoma; EBMD epithelial/anterior basement membrane dystrophy; ACIOL anterior chamber intraocular lens; IOL intraocular lens; PCIOL posterior chamber intraocular lens; Phaco/IOL phacoemulsification with intraocular lens placement; PRK photorefractive keratectomy; LASIK laser assisted in situ keratomileusis; HTN hypertension; DM diabetes mellitus; COPD chronic obstructive pulmonary disease

## 2019-10-10 ENCOUNTER — Ambulatory Visit (INDEPENDENT_AMBULATORY_CARE_PROVIDER_SITE_OTHER): Payer: Medicare Other | Admitting: Ophthalmology

## 2019-10-10 ENCOUNTER — Encounter (INDEPENDENT_AMBULATORY_CARE_PROVIDER_SITE_OTHER): Payer: Self-pay | Admitting: Ophthalmology

## 2019-10-10 ENCOUNTER — Other Ambulatory Visit: Payer: Self-pay

## 2019-10-10 DIAGNOSIS — H3581 Retinal edema: Secondary | ICD-10-CM

## 2019-10-10 DIAGNOSIS — I10 Essential (primary) hypertension: Secondary | ICD-10-CM | POA: Diagnosis not present

## 2019-10-10 DIAGNOSIS — H25813 Combined forms of age-related cataract, bilateral: Secondary | ICD-10-CM

## 2019-10-10 DIAGNOSIS — H35033 Hypertensive retinopathy, bilateral: Secondary | ICD-10-CM

## 2019-10-10 DIAGNOSIS — H3582 Retinal ischemia: Secondary | ICD-10-CM | POA: Diagnosis not present

## 2019-10-10 DIAGNOSIS — H3552 Pigmentary retinal dystrophy: Secondary | ICD-10-CM

## 2019-10-11 ENCOUNTER — Other Ambulatory Visit: Payer: Self-pay

## 2019-10-14 ENCOUNTER — Telehealth: Payer: Self-pay | Admitting: Orthopaedic Surgery

## 2019-10-14 NOTE — Telephone Encounter (Signed)
Patient's daughter called. Says patient needs a refill on hydrocodone called in. Her CB number is [phone]

## 2019-10-17 NOTE — Telephone Encounter (Signed)
Would like Tylenol #3 called into [facility]

## 2019-10-17 NOTE — Telephone Encounter (Signed)
I can call in tylenol 3 or tramadol but cannot refill norco until after sx

## 2019-10-18 ENCOUNTER — Other Ambulatory Visit: Payer: Self-pay | Admitting: Physician Assistant

## 2019-10-18 MED ORDER — ACETAMINOPHEN-CODEINE #3 300-30 MG PO TABS: 1.0000 | ORAL_TABLET | Freq: Three times a day (TID) | ORAL | 0 refills | Status: DC | PRN

## 2019-10-18 NOTE — Telephone Encounter (Signed)
Ok, I called in

## 2019-10-19 ENCOUNTER — Other Ambulatory Visit: Payer: Self-pay

## 2019-10-19 MED ORDER — METOPROLOL SUCCINATE ER 25 MG PO TB24: 25.0000 mg | ORAL_TABLET | Freq: Every day | ORAL | 0 refills | Status: DC

## 2019-10-19 MED ORDER — ASPIRIN 325 MG PO TBEC: 325.0000 mg | DELAYED_RELEASE_TABLET | Freq: Every day | ORAL | 0 refills | Status: DC

## 2019-10-19 NOTE — Telephone Encounter (Signed)
Patient's daughter calls nurse line requesting rx refills. Daughter states that he will be completely out of BP medication tonight.   Forwarding to PCP  Veronda Prude, RN

## 2019-10-28 ENCOUNTER — Other Ambulatory Visit: Payer: Self-pay

## 2019-10-28 ENCOUNTER — Encounter (HOSPITAL_COMMUNITY): Payer: Self-pay | Admitting: Orthopaedic Surgery

## 2019-10-28 MED ORDER — TRANEXAMIC ACID 1000 MG/10ML IV SOLN
2000.0000 mg | INTRAVENOUS | Status: AC
  Administered 2019-10-31: 2000 mg via TOPICAL
  Filled 2019-10-28: qty 20

## 2019-10-28 MED ORDER — BUPIVACAINE LIPOSOME 1.3 % IJ SUSP
10.0000 mL | Freq: Once | INTRAMUSCULAR | Status: DC
  Filled 2019-10-28: qty 10

## 2019-10-28 NOTE — Progress Notes (Signed)
Spoke with pt's stepdaughter, Shanda Bumps for pre-op call. DPR on file. Pt has hx of a stroke in the past year. Pt is on Aspirin 325 mg and was not instructed by anyone whether to hold it prior to surgery or continue. I suggested to Shanda Bumps that she call the surgeon on call and ask that person if he needs to stop it prior to surgery.  Covid test scheduled for Saturday. Instructed Shanda Bumps to have pt quarantine after the test is done and stay in quarantine until he comes to the hospital on Monday.

## 2019-10-28 NOTE — Progress Notes (Deleted)
   10/28/19 1851  OBSTRUCTIVE SLEEP APNEA  Have you ever been diagnosed with sleep apnea through a sleep study? No  Do you snore loudly (loud enough to be heard through closed doors)?  1  Do you often feel tired, fatigued, or sleepy during the daytime (such as falling asleep during driving or talking to someone)? 1  Has anyone observed you stop breathing during your sleep? 0  Do you have, or are you being treated for high blood pressure? 1  BMI more than 35 kg/m2? 0  Age > 50 (1-yes) 1  Male Gender (Yes=1) 1  Obstructive Sleep Apnea Score 5  Score 5 or greater  Results sent to PCP

## 2019-10-28 NOTE — Progress Notes (Signed)
°   10/28/19 1851  OBSTRUCTIVE SLEEP APNEA  Have you ever been diagnosed with sleep apnea through a sleep study? No  Do you snore loudly (loud enough to be heard through closed doors)?  1  Do you often feel tired, fatigued, or sleepy during the daytime (such as falling asleep during driving or talking to someone)? 1  Has anyone observed you stop breathing during your sleep? 0  Do you have, or are you being treated for high blood pressure? 1  BMI more than 35 kg/m2? 1  Age > 50 (1-yes) 1  Male Gender (Yes=1) 1  Obstructive Sleep Apnea Score 6  Score 5 or greater  Results sent to PCP

## 2019-10-29 ENCOUNTER — Other Ambulatory Visit (HOSPITAL_COMMUNITY)
Admission: RE | Admit: 2019-10-29 | Discharge: 2019-10-29 | Disposition: A | Discharge status: Home / Self Care | Payer: Medicare Other | Source: Ambulatory Visit | Attending: Orthopaedic Surgery | Admitting: Orthopaedic Surgery

## 2019-10-29 DIAGNOSIS — Z20822 Contact with and (suspected) exposure to covid-19: Secondary | ICD-10-CM | POA: Diagnosis not present

## 2019-10-29 DIAGNOSIS — Z01812 Encounter for preprocedural laboratory examination: Secondary | ICD-10-CM | POA: Insufficient documentation

## 2019-10-29 LAB — SARS CORONAVIRUS 2 (TAT 6-24 HRS): SARS Coronavirus 2: NEGATIVE

## 2019-10-31 ENCOUNTER — Other Ambulatory Visit: Payer: Self-pay | Admitting: Physician Assistant

## 2019-10-31 ENCOUNTER — Encounter (HOSPITAL_COMMUNITY)
Admission: RE | Disposition: A | Discharge status: Skilled Nursing Facility | Payer: Self-pay | Source: Home / Self Care | Attending: Orthopaedic Surgery

## 2019-10-31 ENCOUNTER — Observation Stay (HOSPITAL_COMMUNITY)
Admission: RE | Admit: 2019-10-31 | Discharge: 2019-11-03 | Disposition: A | Discharge status: Skilled Nursing Facility | Payer: Medicare Other | Attending: Orthopaedic Surgery | Admitting: Orthopaedic Surgery

## 2019-10-31 ENCOUNTER — Ambulatory Visit (HOSPITAL_COMMUNITY): Payer: Medicare Other | Admitting: Certified Registered Nurse Anesthetist

## 2019-10-31 ENCOUNTER — Encounter (HOSPITAL_COMMUNITY): Payer: Self-pay | Admitting: Orthopaedic Surgery

## 2019-10-31 ENCOUNTER — Observation Stay (HOSPITAL_COMMUNITY): Payer: Medicare Other

## 2019-10-31 ENCOUNTER — Ambulatory Visit (HOSPITAL_COMMUNITY): Payer: Medicare Other

## 2019-10-31 ENCOUNTER — Other Ambulatory Visit: Payer: Self-pay

## 2019-10-31 DIAGNOSIS — I10 Essential (primary) hypertension: Secondary | ICD-10-CM | POA: Insufficient documentation

## 2019-10-31 DIAGNOSIS — S72002G Fracture of unspecified part of neck of left femur, subsequent encounter for closed fracture with delayed healing: Secondary | ICD-10-CM | POA: Diagnosis not present

## 2019-10-31 DIAGNOSIS — Y939 Activity, unspecified: Secondary | ICD-10-CM | POA: Diagnosis not present

## 2019-10-31 DIAGNOSIS — Y999 Unspecified external cause status: Secondary | ICD-10-CM | POA: Diagnosis not present

## 2019-10-31 DIAGNOSIS — Z79899 Other long term (current) drug therapy: Secondary | ICD-10-CM | POA: Diagnosis not present

## 2019-10-31 DIAGNOSIS — S72042A Displaced fracture of base of neck of left femur, initial encounter for closed fracture: Secondary | ICD-10-CM

## 2019-10-31 DIAGNOSIS — F17210 Nicotine dependence, cigarettes, uncomplicated: Secondary | ICD-10-CM | POA: Diagnosis not present

## 2019-10-31 DIAGNOSIS — Z7982 Long term (current) use of aspirin: Secondary | ICD-10-CM | POA: Diagnosis not present

## 2019-10-31 DIAGNOSIS — Y929 Unspecified place or not applicable: Secondary | ICD-10-CM | POA: Diagnosis not present

## 2019-10-31 DIAGNOSIS — X58XXXD Exposure to other specified factors, subsequent encounter: Secondary | ICD-10-CM | POA: Diagnosis not present

## 2019-10-31 DIAGNOSIS — Z96642 Presence of left artificial hip joint: Secondary | ICD-10-CM

## 2019-10-31 DIAGNOSIS — Z96649 Presence of unspecified artificial hip joint: Secondary | ICD-10-CM

## 2019-10-31 DIAGNOSIS — M25552 Pain in left hip: Secondary | ICD-10-CM | POA: Diagnosis not present

## 2019-10-31 DIAGNOSIS — M1612 Unilateral primary osteoarthritis, left hip: Secondary | ICD-10-CM

## 2019-10-31 DIAGNOSIS — S79912D Unspecified injury of left hip, subsequent encounter: Secondary | ICD-10-CM | POA: Diagnosis present

## 2019-10-31 HISTORY — DX: Cerebral infarction, unspecified: I63.9

## 2019-10-31 HISTORY — DX: Unspecified osteoarthritis, unspecified site: M19.90

## 2019-10-31 HISTORY — PX: TOTAL HIP ARTHROPLASTY: SHX124

## 2019-10-31 LAB — CBC
HCT: 38.3 % — ABNORMAL LOW (ref 39.0–52.0)
Hemoglobin: 11.8 g/dL — ABNORMAL LOW (ref 13.0–17.0)
MCH: 26.8 pg (ref 26.0–34.0)
MCHC: 30.8 g/dL (ref 30.0–36.0)
MCV: 87 fL (ref 80.0–100.0)
Platelets: 238 10*3/uL (ref 150–400)
RBC: 4.4 MIL/uL (ref 4.22–5.81)
RDW: 16.4 % — ABNORMAL HIGH (ref 11.5–15.5)
WBC: 12 10*3/uL — ABNORMAL HIGH (ref 4.0–10.5)
nRBC: 0 % (ref 0.0–0.2)

## 2019-10-31 LAB — COMPREHENSIVE METABOLIC PANEL
ALT: 28 U/L (ref 0–44)
AST: 29 U/L (ref 15–41)
Albumin: 3.9 g/dL (ref 3.5–5.0)
Alkaline Phosphatase: 102 U/L (ref 38–126)
Anion gap: 12 (ref 5–15)
BUN: 9 mg/dL (ref 8–23)
CO2: 26 mmol/L (ref 22–32)
Calcium: 9.6 mg/dL (ref 8.9–10.3)
Chloride: 100 mmol/L (ref 98–111)
Creatinine, Ser: 0.72 mg/dL (ref 0.61–1.24)
GFR calc Af Amer: >60 mL/min (ref >60)
GFR calc non Af Amer: >60 mL/min (ref >60)
Glucose, Bld: 93 mg/dL (ref 70–99)
Potassium: 3.9 mmol/L (ref 3.5–5.1)
Sodium: 138 mmol/L (ref 135–145)
Total Bilirubin: 0.5 mg/dL (ref 0.3–1.2)
Total Protein: 7.6 g/dL (ref 6.5–8.1)

## 2019-10-31 LAB — PROTIME-INR
INR: 1.1 (ref 0.8–1.2)
Prothrombin Time: 13.3 seconds (ref 11.4–15.2)

## 2019-10-31 LAB — CBC WITH DIFFERENTIAL/PLATELET
Abs Immature Granulocytes: 0.01 10*3/uL (ref 0.00–0.07)
Basophils Absolute: 0 10*3/uL (ref 0.0–0.1)
Basophils Relative: 1 %
Eosinophils Absolute: 0.1 10*3/uL (ref 0.0–0.5)
Eosinophils Relative: 3 %
HCT: 41.2 % (ref 39.0–52.0)
Hemoglobin: 12.7 g/dL — ABNORMAL LOW (ref 13.0–17.0)
Immature Granulocytes: 0 %
Lymphocytes Relative: 40 %
Lymphs Abs: 1.9 10*3/uL (ref 0.7–4.0)
MCH: 26.7 pg (ref 26.0–34.0)
MCHC: 30.8 g/dL (ref 30.0–36.0)
MCV: 86.7 fL (ref 80.0–100.0)
Monocytes Absolute: 0.6 10*3/uL (ref 0.1–1.0)
Monocytes Relative: 12 %
Neutro Abs: 2.1 10*3/uL (ref 1.7–7.7)
Neutrophils Relative %: 44 %
Platelets: 273 10*3/uL (ref 150–400)
RBC: 4.75 MIL/uL (ref 4.22–5.81)
RDW: 16.3 % — ABNORMAL HIGH (ref 11.5–15.5)
WBC: 4.7 10*3/uL (ref 4.0–10.5)
nRBC: 0 % (ref 0.0–0.2)

## 2019-10-31 LAB — TYPE AND SCREEN
ABO/RH(D): O POS
Antibody Screen: NEGATIVE

## 2019-10-31 LAB — URINALYSIS, ROUTINE W REFLEX MICROSCOPIC
Bilirubin Urine: NEGATIVE
Glucose, UA: NEGATIVE mg/dL
Hgb urine dipstick: NEGATIVE
Ketones, ur: NEGATIVE mg/dL
Leukocytes,Ua: NEGATIVE
Nitrite: NEGATIVE
Protein, ur: NEGATIVE mg/dL
Specific Gravity, Urine: 1.032 — ABNORMAL HIGH (ref 1.005–1.030)
pH: 5 (ref 5.0–8.0)

## 2019-10-31 LAB — CREATININE, SERUM
Creatinine, Ser: 0.87 mg/dL (ref 0.61–1.24)
GFR calc Af Amer: >60 mL/min (ref >60)
GFR calc non Af Amer: >60 mL/min (ref >60)

## 2019-10-31 LAB — SURGICAL PCR SCREEN
MRSA, PCR: NEGATIVE
Staphylococcus aureus: NEGATIVE

## 2019-10-31 LAB — ABO/RH: ABO/RH(D): O POS

## 2019-10-31 LAB — APTT: aPTT: 32 seconds (ref 24–36)

## 2019-10-31 LAB — PREALBUMIN: Prealbumin: 21.8 mg/dL (ref 18–38)

## 2019-10-31 SURGERY — ARTHROPLASTY, HIP, TOTAL,POSTERIOR APPROACH
Anesthesia: Spinal | Site: Hip | Laterality: Left

## 2019-10-31 MED ORDER — ONDANSETRON HCL 4 MG/2ML IJ SOLN: 4.0000 mg | Freq: Once | INTRAMUSCULAR | Status: DC | PRN

## 2019-10-31 MED ORDER — PROPOFOL 10 MG/ML IV BOLUS
INTRAVENOUS | Status: DC | PRN
  Administered 2019-10-31: 50 mg via INTRAVENOUS
  Administered 2019-10-31: 30 mg via INTRAVENOUS

## 2019-10-31 MED ORDER — BUPIVACAINE LIPOSOME 1.3 % IJ SUSP
INTRAMUSCULAR | Status: DC | PRN
  Administered 2019-10-31: 20 mL

## 2019-10-31 MED ORDER — PHENYLEPHRINE 40 MCG/ML (10ML) SYRINGE FOR IV PUSH (FOR BLOOD PRESSURE SUPPORT): PREFILLED_SYRINGE | INTRAVENOUS | Status: DC | PRN

## 2019-10-31 MED ORDER — SODIUM CHLORIDE (PF) 0.9 % IJ SOLN
INTRAMUSCULAR | Status: DC | PRN
  Administered 2019-10-31: 20 mL

## 2019-10-31 MED ORDER — HYDROCHLOROTHIAZIDE 25 MG PO TABS
25.0000 mg | ORAL_TABLET | Freq: Every morning | ORAL | Status: DC
  Administered 2019-11-01 – 2019-11-03 (×3): 25 mg via ORAL
  Filled 2019-10-31 (×3): qty 1

## 2019-10-31 MED ORDER — DOCUSATE SODIUM 100 MG PO CAPS
100.0000 mg | ORAL_CAPSULE | Freq: Every day | ORAL | 2 refills | Status: DC | PRN
Start: 2019-10-31 — End: 2019-11-03

## 2019-10-31 MED ORDER — PROPOFOL 10 MG/ML IV BOLUS
INTRAVENOUS | Status: AC
  Filled 2019-10-31: qty 20

## 2019-10-31 MED ORDER — METOPROLOL SUCCINATE ER 25 MG PO TB24
25.0000 mg | ORAL_TABLET | Freq: Every day | ORAL | Status: DC
  Administered 2019-11-01 – 2019-11-03 (×3): 25 mg via ORAL
  Filled 2019-10-31 (×3): qty 1

## 2019-10-31 MED ORDER — TRANEXAMIC ACID-NACL 1000-0.7 MG/100ML-% IV SOLN
1000.0000 mg | INTRAVENOUS | Status: AC
  Administered 2019-10-31: 1000 mg via INTRAVENOUS
  Filled 2019-10-31: qty 100

## 2019-10-31 MED ORDER — KETOROLAC TROMETHAMINE 15 MG/ML IJ SOLN
15.0000 mg | Freq: Four times a day (QID) | INTRAMUSCULAR | Status: AC
  Administered 2019-10-31 – 2019-11-01 (×4): 15 mg via INTRAVENOUS
  Filled 2019-10-31 (×4): qty 1

## 2019-10-31 MED ORDER — OXYCODONE HCL 5 MG PO TABS
5.0000 mg | ORAL_TABLET | ORAL | Status: DC | PRN
  Administered 2019-11-03: 10 mg via ORAL
  Filled 2019-10-31 (×4): qty 2

## 2019-10-31 MED ORDER — BUPIVACAINE HCL (PF) 0.25 % IJ SOLN
INTRAMUSCULAR | Status: AC
  Filled 2019-10-31: qty 30

## 2019-10-31 MED ORDER — DEXAMETHASONE SODIUM PHOSPHATE 10 MG/ML IJ SOLN
10.0000 mg | Freq: Once | INTRAMUSCULAR | Status: AC
  Administered 2019-11-01: 10 mg via INTRAVENOUS
  Filled 2019-10-31: qty 1

## 2019-10-31 MED ORDER — OXYCODONE HCL 5 MG PO TABS: ORAL_TABLET | ORAL | 0 refills | Status: DC

## 2019-10-31 MED ORDER — VANCOMYCIN HCL 1000 MG IV SOLR
INTRAVENOUS | Status: AC
  Filled 2019-10-31: qty 1000

## 2019-10-31 MED ORDER — SODIUM CHLORIDE 0.9 % IR SOLN
Status: DC | PRN
  Administered 2019-10-31: 3000 mL

## 2019-10-31 MED ORDER — DEXAMETHASONE SODIUM PHOSPHATE 10 MG/ML IJ SOLN
INTRAMUSCULAR | Status: AC
  Filled 2019-10-31: qty 1

## 2019-10-31 MED ORDER — PHENOL 1.4 % MT LIQD: 1.0000 | OROMUCOSAL | Status: DC | PRN

## 2019-10-31 MED ORDER — BUPIVACAINE HCL (PF) 0.25 % IJ SOLN
INTRAMUSCULAR | Status: DC | PRN
  Administered 2019-10-31: 21 mL
  Administered 2019-10-31: 20 mL

## 2019-10-31 MED ORDER — POVIDONE-IODINE 10 % EX SWAB
2.0000 "application " | Freq: Once | CUTANEOUS | Status: AC
  Administered 2019-10-31: 2 via TOPICAL

## 2019-10-31 MED ORDER — GABAPENTIN 300 MG PO CAPS
300.0000 mg | ORAL_CAPSULE | Freq: Three times a day (TID) | ORAL | Status: DC
  Administered 2019-10-31 – 2019-11-03 (×8): 300 mg via ORAL
  Filled 2019-10-31 (×8): qty 1

## 2019-10-31 MED ORDER — MENTHOL 3 MG MT LOZG: 1.0000 | LOZENGE | OROMUCOSAL | Status: DC | PRN

## 2019-10-31 MED ORDER — CHLORHEXIDINE GLUCONATE 0.12 % MT SOLN
OROMUCOSAL | Status: AC
  Administered 2019-10-31: 15 mL via OROMUCOSAL
  Filled 2019-10-31: qty 15

## 2019-10-31 MED ORDER — PHENYLEPHRINE 40 MCG/ML (10ML) SYRINGE FOR IV PUSH (FOR BLOOD PRESSURE SUPPORT)
PREFILLED_SYRINGE | INTRAVENOUS | Status: AC
  Filled 2019-10-31: qty 10

## 2019-10-31 MED ORDER — OXYCODONE HCL 5 MG PO TABS
10.0000 mg | ORAL_TABLET | ORAL | Status: DC | PRN
  Administered 2019-11-01: 15 mg via ORAL
  Administered 2019-11-02 – 2019-11-03 (×3): 10 mg via ORAL
  Filled 2019-10-31: qty 3

## 2019-10-31 MED ORDER — POLYETHYLENE GLYCOL 3350 17 G PO PACK: 17.0000 g | PACK | Freq: Every day | ORAL | Status: DC | PRN

## 2019-10-31 MED ORDER — ENOXAPARIN SODIUM 40 MG/0.4ML ~~LOC~~ SOLN
40.0000 mg | SUBCUTANEOUS | Status: DC
  Administered 2019-11-01 – 2019-11-03 (×3): 40 mg via SUBCUTANEOUS
  Filled 2019-10-31 (×3): qty 0.4

## 2019-10-31 MED ORDER — ONDANSETRON HCL 4 MG PO TABS: 4.0000 mg | ORAL_TABLET | Freq: Four times a day (QID) | ORAL | Status: DC | PRN

## 2019-10-31 MED ORDER — PROPOFOL 500 MG/50ML IV EMUL
INTRAVENOUS | Status: DC | PRN
  Administered 2019-10-31: 40 ug/kg/min via INTRAVENOUS

## 2019-10-31 MED ORDER — FENTANYL CITRATE (PF) 100 MCG/2ML IJ SOLN
INTRAMUSCULAR | Status: DC | PRN
  Administered 2019-10-31: 50 ug via INTRAVENOUS

## 2019-10-31 MED ORDER — FENTANYL CITRATE (PF) 100 MCG/2ML IJ SOLN
25.0000 ug | INTRAMUSCULAR | Status: DC | PRN
  Administered 2019-10-31: 25 ug via INTRAVENOUS

## 2019-10-31 MED ORDER — ACETAMINOPHEN 500 MG PO TABS
1000.0000 mg | ORAL_TABLET | Freq: Four times a day (QID) | ORAL | Status: AC
  Administered 2019-10-31 – 2019-11-01 (×4): 1000 mg via ORAL
  Filled 2019-10-31 (×4): qty 2

## 2019-10-31 MED ORDER — ONDANSETRON HCL 4 MG/2ML IJ SOLN
INTRAMUSCULAR | Status: DC | PRN
  Administered 2019-10-31: 4 mg via INTRAVENOUS

## 2019-10-31 MED ORDER — LIDOCAINE 2% (20 MG/ML) 5 ML SYRINGE
INTRAMUSCULAR | Status: DC | PRN
  Administered 2019-10-31: 40 mg via INTRAVENOUS

## 2019-10-31 MED ORDER — PHENYLEPHRINE HCL-NACL 10-0.9 MG/250ML-% IV SOLN
INTRAVENOUS | Status: DC | PRN
Start: 2019-10-31 — End: 2019-10-31
  Administered 2019-10-31: 50 ug/min via INTRAVENOUS

## 2019-10-31 MED ORDER — DEXAMETHASONE SODIUM PHOSPHATE 10 MG/ML IJ SOLN
INTRAMUSCULAR | Status: DC | PRN
  Administered 2019-10-31: 5 mg via INTRAVENOUS

## 2019-10-31 MED ORDER — OLOPATADINE HCL 0.1 % OP SOLN
1.0000 [drp] | Freq: Two times a day (BID) | OPHTHALMIC | Status: DC
  Administered 2019-10-31 – 2019-11-03 (×6): 1 [drp] via OPHTHALMIC
  Filled 2019-10-31: qty 5

## 2019-10-31 MED ORDER — ORAL CARE MOUTH RINSE: 15.0000 mL | Freq: Once | OROMUCOSAL | Status: AC

## 2019-10-31 MED ORDER — CYCLOSPORINE 0.05 % OP EMUL
1.0000 [drp] | Freq: Two times a day (BID) | OPHTHALMIC | Status: DC
  Administered 2019-10-31 – 2019-11-03 (×6): 1 [drp] via OPHTHALMIC
  Filled 2019-10-31 (×7): qty 1

## 2019-10-31 MED ORDER — LACTATED RINGERS IV SOLN: INTRAVENOUS | Status: DC

## 2019-10-31 MED ORDER — SODIUM CHLORIDE 0.9 % IV SOLN: INTRAVENOUS | Status: DC

## 2019-10-31 MED ORDER — OXYCODONE HCL 5 MG/5ML PO SOLN: 5.0000 mg | Freq: Once | ORAL | Status: DC | PRN

## 2019-10-31 MED ORDER — 0.9 % SODIUM CHLORIDE (POUR BTL) OPTIME
TOPICAL | Status: DC | PRN
  Administered 2019-10-31: 1000 mL

## 2019-10-31 MED ORDER — CEFAZOLIN SODIUM-DEXTROSE 2-4 GM/100ML-% IV SOLN
2.0000 g | INTRAVENOUS | Status: AC
  Administered 2019-10-31: 2 g via INTRAVENOUS
  Filled 2019-10-31: qty 100

## 2019-10-31 MED ORDER — ONDANSETRON HCL 4 MG/2ML IJ SOLN
INTRAMUSCULAR | Status: AC
  Filled 2019-10-31: qty 2

## 2019-10-31 MED ORDER — OXYCODONE HCL ER 10 MG PO T12A
10.0000 mg | EXTENDED_RELEASE_TABLET | Freq: Two times a day (BID) | ORAL | Status: DC
  Administered 2019-10-31 – 2019-11-03 (×6): 10 mg via ORAL
  Filled 2019-10-31 (×6): qty 1

## 2019-10-31 MED ORDER — METHOCARBAMOL 500 MG PO TABS: 500.0000 mg | ORAL_TABLET | Freq: Two times a day (BID) | ORAL | 0 refills | Status: DC | PRN

## 2019-10-31 MED ORDER — CHLORHEXIDINE GLUCONATE 0.12 % MT SOLN: 15.0000 mL | Freq: Once | OROMUCOSAL | Status: AC

## 2019-10-31 MED ORDER — FENTANYL CITRATE (PF) 100 MCG/2ML IJ SOLN
INTRAMUSCULAR | Status: AC
  Filled 2019-10-31: qty 2

## 2019-10-31 MED ORDER — VANCOMYCIN HCL 1000 MG IV SOLR
INTRAVENOUS | Status: DC | PRN
  Administered 2019-10-31: 1000 mg

## 2019-10-31 MED ORDER — ALUM & MAG HYDROXIDE-SIMETH 200-200-20 MG/5ML PO SUSP: 30.0000 mL | ORAL | Status: DC | PRN

## 2019-10-31 MED ORDER — ATORVASTATIN CALCIUM 80 MG PO TABS
80.0000 mg | ORAL_TABLET | Freq: Every day | ORAL | Status: DC
  Administered 2019-11-01 – 2019-11-03 (×3): 80 mg via ORAL
  Filled 2019-10-31 (×3): qty 1

## 2019-10-31 MED ORDER — ONDANSETRON HCL 4 MG PO TABS: 4.0000 mg | ORAL_TABLET | Freq: Three times a day (TID) | ORAL | 0 refills | Status: DC | PRN

## 2019-10-31 MED ORDER — CEFAZOLIN SODIUM-DEXTROSE 2-4 GM/100ML-% IV SOLN
2.0000 g | Freq: Four times a day (QID) | INTRAVENOUS | Status: AC
  Administered 2019-10-31 – 2019-11-01 (×3): 2 g via INTRAVENOUS
  Filled 2019-10-31 (×3): qty 100

## 2019-10-31 MED ORDER — SORBITOL 70 % SOLN: 30.0000 mL | Freq: Every day | Status: DC | PRN

## 2019-10-31 MED ORDER — ENOXAPARIN SODIUM 40 MG/0.4ML ~~LOC~~ SOLN
40.0000 mg | SUBCUTANEOUS | 14 refills | Status: DC
Start: 2019-10-31 — End: 2019-11-01

## 2019-10-31 MED ORDER — DOCUSATE SODIUM 100 MG PO CAPS
100.0000 mg | ORAL_CAPSULE | Freq: Two times a day (BID) | ORAL | Status: DC
  Administered 2019-10-31 – 2019-11-03 (×6): 100 mg via ORAL
  Filled 2019-10-31 (×6): qty 1

## 2019-10-31 MED ORDER — METOCLOPRAMIDE HCL 5 MG PO TABS: 5.0000 mg | ORAL_TABLET | Freq: Three times a day (TID) | ORAL | Status: DC | PRN

## 2019-10-31 MED ORDER — LIDOCAINE 2% (20 MG/ML) 5 ML SYRINGE
INTRAMUSCULAR | Status: AC
  Filled 2019-10-31: qty 5

## 2019-10-31 MED ORDER — ACETAMINOPHEN 325 MG PO TABS
325.0000 mg | ORAL_TABLET | Freq: Four times a day (QID) | ORAL | Status: DC | PRN
  Administered 2019-11-02: 650 mg via ORAL
  Filled 2019-10-31: qty 2

## 2019-10-31 MED ORDER — AMLODIPINE BESYLATE 10 MG PO TABS
10.0000 mg | ORAL_TABLET | Freq: Every day | ORAL | Status: DC
  Administered 2019-11-01 – 2019-11-03 (×3): 10 mg via ORAL
  Filled 2019-10-31 (×3): qty 1

## 2019-10-31 MED ORDER — MIDAZOLAM HCL 2 MG/2ML IJ SOLN
INTRAMUSCULAR | Status: AC
  Filled 2019-10-31: qty 2

## 2019-10-31 MED ORDER — MAGNESIUM CITRATE PO SOLN: 1.0000 | Freq: Once | ORAL | Status: DC | PRN

## 2019-10-31 MED ORDER — HYDROMORPHONE HCL 1 MG/ML IJ SOLN: 0.5000 mg | INTRAMUSCULAR | Status: DC | PRN

## 2019-10-31 MED ORDER — FENTANYL CITRATE (PF) 250 MCG/5ML IJ SOLN
INTRAMUSCULAR | Status: AC
  Filled 2019-10-31: qty 5

## 2019-10-31 MED ORDER — OXYCODONE HCL 5 MG PO TABS: 5.0000 mg | ORAL_TABLET | Freq: Once | ORAL | Status: DC | PRN

## 2019-10-31 MED ORDER — DIPHENHYDRAMINE HCL 12.5 MG/5ML PO ELIX: 25.0000 mg | ORAL_SOLUTION | ORAL | Status: DC | PRN

## 2019-10-31 MED ORDER — MIDAZOLAM HCL 5 MG/5ML IJ SOLN
INTRAMUSCULAR | Status: DC | PRN
  Administered 2019-10-31: 2 mg via INTRAVENOUS

## 2019-10-31 MED ORDER — METOCLOPRAMIDE HCL 5 MG/ML IJ SOLN: 5.0000 mg | Freq: Three times a day (TID) | INTRAMUSCULAR | Status: DC | PRN

## 2019-10-31 MED ORDER — ONDANSETRON HCL 4 MG/2ML IJ SOLN: 4.0000 mg | Freq: Four times a day (QID) | INTRAMUSCULAR | Status: DC | PRN

## 2019-10-31 SURGICAL SUPPLY — 73 items
BALL HIP ARTICU 28 +5 (Hips) ×1 IMPLANT
BLADE CLIPPER SURG (BLADE) ×2 IMPLANT
BRUSH FEMORAL CANAL (MISCELLANEOUS) IMPLANT
CONT SPEC 4OZ CLIKSEAL STRL BL (MISCELLANEOUS) ×2 IMPLANT
COVER SURGICAL LIGHT HANDLE (MISCELLANEOUS) ×2 IMPLANT
COVER WAND RF STERILE (DRAPES) IMPLANT
CUP ACET PNNCL SECTR W/GRIP 56 (Hips) ×1 IMPLANT
DERMABOND ADVANCED (GAUZE/BANDAGES/DRESSINGS) ×1
DERMABOND ADVANCED .7 DNX12 (GAUZE/BANDAGES/DRESSINGS) ×1 IMPLANT
DRAPE HALF SHEET 40X57 (DRAPES) ×4 IMPLANT
DRAPE HIP W/POCKET STRL (MISCELLANEOUS) ×2 IMPLANT
DRAPE INCISE IOBAN 66X45 STRL (DRAPES) ×2 IMPLANT
DRAPE ORTHO SPLIT 77X108 STRL (DRAPES) ×2
DRAPE POUCH INSTRU U-SHP 10X18 (DRAPES) ×2 IMPLANT
DRAPE SURG ORHT 6 SPLT 77X108 (DRAPES) ×2 IMPLANT
DRAPE U-SHAPE 47X51 STRL (DRAPES) ×2 IMPLANT
DRSG AQUACEL AG ADV 3.5X10 (GAUZE/BANDAGES/DRESSINGS) ×2 IMPLANT
DRSG AQUACEL AG ADV 3.5X14 (GAUZE/BANDAGES/DRESSINGS) IMPLANT
DURAPREP 26ML APPLICATOR (WOUND CARE) ×6 IMPLANT
ELECT BLADE 4.0 EZ CLEAN MEGAD (MISCELLANEOUS) ×2
ELECT BLADE 6.5 EXT (BLADE) IMPLANT
ELECT CAUTERY BLADE 6.4 (BLADE) IMPLANT
ELECT REM PT RETURN 9FT ADLT (ELECTROSURGICAL) ×2
ELECTRODE BLDE 4.0 EZ CLN MEGD (MISCELLANEOUS) ×1 IMPLANT
ELECTRODE REM PT RTRN 9FT ADLT (ELECTROSURGICAL) ×1 IMPLANT
EVACUATOR 1/8 PVC DRAIN (DRAIN) IMPLANT
FILTER STRAW FLUID ASPIR (MISCELLANEOUS) ×2 IMPLANT
GLOVE BIOGEL PI IND STRL 7.0 (GLOVE) ×1 IMPLANT
GLOVE BIOGEL PI INDICATOR 7.0 (GLOVE) ×1
GLOVE ECLIPSE 6.5 STRL STRAW (GLOVE) ×4 IMPLANT
GLOVE SKINSENSE NS SZ7.5 (GLOVE) ×1
GLOVE SKINSENSE STRL SZ7.5 (GLOVE) ×1 IMPLANT
GLOVE SURG SYN 7.5  E (GLOVE) ×2
GLOVE SURG SYN 7.5 E (GLOVE) ×2 IMPLANT
HANDPIECE INTERPULSE COAX TIP (DISPOSABLE)
HIP BALL ARTICU 28 +5 (Hips) ×2 IMPLANT
HOOD PEEL AWAY FLYTE STAYCOOL (MISCELLANEOUS) ×6 IMPLANT
INSERT DM PINNACLE 56X49 (Insert) ×2 IMPLANT
KIT BASIN OR (CUSTOM PROCEDURE TRAY) ×2 IMPLANT
KIT TURNOVER KIT B (KITS) ×2 IMPLANT
LINER ACET MENTUM 49X28 (Liner) ×2 IMPLANT
MANIFOLD NEPTUNE II (INSTRUMENTS) ×2 IMPLANT
NEEDLE SPNL 18GX3.5 QUINCKE PK (NEEDLE) ×2 IMPLANT
NS IRRIG 1000ML POUR BTL (IV SOLUTION) ×2 IMPLANT
PACK TOTAL JOINT (CUSTOM PROCEDURE TRAY) ×2 IMPLANT
PAD ARMBOARD 7.5X6 YLW CONV (MISCELLANEOUS) ×4 IMPLANT
PASSER SUT SWANSON 36MM LOOP (INSTRUMENTS) IMPLANT
PINN SECTOR W/GRIP ACE CUP 56 (Hips) ×2 IMPLANT
SCREW 6.5MMX25MM (Screw) ×2 IMPLANT
SET HNDPC FAN SPRY TIP SCT (DISPOSABLE) IMPLANT
SPONGE LAP 18X18 RF (DISPOSABLE) IMPLANT
STAPLER VISISTAT 35W (STAPLE) IMPLANT
STEM FEM ACTIS HIGH SZ7 (Stem) ×2 IMPLANT
SUT ETHIBOND NAB CT1 #1 30IN (SUTURE) ×2 IMPLANT
SUT ETHILON 2 0 PSLX (SUTURE) ×4 IMPLANT
SUT PDS AB 0 CT 36 (SUTURE) ×2 IMPLANT
SUT PDS AB 1 CT  36 (SUTURE) ×1
SUT PDS AB 1 CT 36 (SUTURE) ×1 IMPLANT
SUT VIC AB 0 CT1 27 (SUTURE) ×2
SUT VIC AB 0 CT1 27XBRD ANBCTR (SUTURE) ×2 IMPLANT
SUT VIC AB 1 CT1 27 (SUTURE) ×4
SUT VIC AB 1 CT1 27XBRD ANBCTR (SUTURE) ×4 IMPLANT
SUT VIC AB 1 CT1 27XBRD ANTBC (SUTURE) IMPLANT
SUT VIC AB 2-0 CT1 27 (SUTURE) ×5
SUT VIC AB 2-0 CT1 TAPERPNT 27 (SUTURE) ×5 IMPLANT
SYR 50ML LL SCALE MARK (SYRINGE) ×2 IMPLANT
SYR TB 1ML LUER SLIP (SYRINGE) ×2 IMPLANT
TOWEL GREEN STERILE (TOWEL DISPOSABLE) ×2 IMPLANT
TOWEL GREEN STERILE FF (TOWEL DISPOSABLE) ×2 IMPLANT
TOWER CARTRIDGE SMART MIX (DISPOSABLE) IMPLANT
TRAY FOL W/BAG SLVR 16FR STRL (SET/KITS/TRAYS/PACK) ×1 IMPLANT
TRAY FOLEY W/BAG SLVR 16FR LF (SET/KITS/TRAYS/PACK) ×1
WATER STERILE IRR 1000ML POUR (IV SOLUTION) IMPLANT

## 2019-10-31 NOTE — Transfer of Care (Signed)
Immediate Anesthesia Transfer of Care Note  Patient: Kristopher Ruiz.  Procedure(s) Performed: LEFT TOTAL HIP ARTHROPLASTY, POSTERIOR (Left Hip)  Patient Location: PACU  Anesthesia Type:Spinal  Level of Consciousness: awake, drowsy and patient cooperative  Airway & Oxygen Therapy: Patient Spontanous Breathing  Post-op Assessment: Report given to RN and Post -op Vital signs reviewed and stable  Post vital signs: Reviewed and stable  Last Vitals:  Vitals Value Taken Time  BP    Temp    Pulse    Resp    SpO2      Last Pain:  Vitals:   10/31/19 1130  TempSrc:   PainSc: 9       Patients Stated Pain Goal: 4 (10/31/19 1130)  Complications: No complications documented.

## 2019-10-31 NOTE — Op Note (Signed)
LEFT TOTAL HIP ARTHROPLASTY, POSTERIOR  Procedure Note Azaria Bartell   [id number]  Pre-op Diagnosis: Chronic Left Femoral Neck Fracture     Post-op Diagnosis: same   Operative Procedures  1. Total hip replacement; Left hip; uncemented [id number]   Personnel  Surgeon(s): Tarry Kos, MD   Assistant: Tessa Lerner, PA-C necessary for the timely completion of the procedure   Anesthesia: spinal  Prosthesis:  Acetabulum: DePuy Pinnacle gription 56 mm Femur: Actis 7 high offset Head: 49 mm size: +5 Bearing Type: dual mobility  Date of Service: 10/31/2019   Indication: 69 y.o. year old male with a history of hip pain that has failed conservative management. After risk and benefits of a total hip arthroplasty were explained, the patient elected to proceed with a total hip arthroplasty after voicing understanding.  Procedure:  After informed consent was obtained and understanding of the risk were voiced including but not limited to bleeding, infection, damage to surrounding structures including nerves and vessels, blood clots, leg length inequality, dislocation and the failure to achieve desired results, the operative extremity was marked with verbal confirmation of the patient in the holding area.   The patient was then brought to the operating room and transported to the operating room table and placed in the lateral decubitus position. The operative limb was then prepped and draped in the usual sterile fashion and preoperative antibiotics were administered.  A time out was performed prior to the start of surgery confirming the correct extremity, preoperative antibiotic administration, as well as team members, implants and instruments available for the case. Correct surgical site was also confirmed with preoperative radiographs. A standard posterior approach to the hip was performed. A capsulotomy was performed and the capsule tagged for later repair.  The leg was internally rotated  in order to deliver the femoral and neck nonunion.  The femoral head was removed from the acetabulum without difficulty.  Retractors were then placed for exposure of the acetabulum.  The acetabulum was exposed and labrum resected. We did sequential reaming up to a size 55 mm reamer. The acetabular component, size 56 mm was then impacted into place and position evaluated with guides.  A single 25 mm cancellous screw was placed superiorly through the cup for added fixation.  The liner was then impacted into place. We then turned our attention to femoral preparation. We began sequential broaching to a size 7 stem. This size produced good fit and rotational stability. We placed the trial neck and a 49 mm +5 head.  The hip was stable in extension and external rotation without impingement. The hip was stable in deep flexion. In 90 degrees of abduction and neutral abduction the hip was stable to 80 degrees of internal rotation. In a position of sleep with hip adducted across the body the hip was stable to 75 degrees of rotation.  There was no shuck to the components.  The hip components were then dislocated and the trial components were all removed.  The bony surfaces were all thoroughly irrigated.  The final femoral components were then impacted into place.  We then irrigated and dried the trunion, and the head was placed. We placed an 49 mm +5 head. The hip was again reduced, and taken through a range of motion and found to be stable as above. The hip was thoroughly irrigated, and a posterior capsular repair was performed with #2 Ethibond. The deep fascia was closed with #1 Vicryl, #0 Vicryl for the deep fat layer,  and 2.0 Vicryl Plus for the subcutaneous tissue. The skin was closed with 2-0 nylon and Dermabond.  A sterile dressing was applied. The patient was awakened and transported to the recovery room in stable condition. All sponge, needle, and instrument counts were correct at the end of the case.  Position:  lateral decubitus   Complications: none.  Time Out: performed   Drains/Packing: None  Estimated blood loss: 100 cc  Returned to Recovery Room: in good condition.   Antibiotics: yes   Mechanical VTE (DVT) Prophylaxis: sequential compression devices, TED thigh-high  Chemical VTE (DVT) Prophylaxis: lovenox  Fluid Replacement  Crystalloid: See anesthesia record. Blood: none  FFP: none   Specimens Removed: 1 to pathology   Sponge and Instrument Count Correct? yes   PACU: portable radiograph - low AP pelvis  Admission: inpatient status, start PT & OT POD#1  Plan/RTC: Return in 2 weeks for wound check.  Weight Bearing/Load Lower Extremity: full  Posterior hip precautions  N. Glee Arvin, MD [facility] 2:46 PM

## 2019-10-31 NOTE — Discharge Instructions (Signed)

## 2019-10-31 NOTE — H&P (Signed)
PREOPERATIVE H&P  Chief Complaint: Left Femoral Neck Fracture  HPI: Kristopher Ruiz. is a 69 y.o. male who presents for surgical treatment of Left Femoral Neck Fracture.  He denies any changes in medical history.  Past Medical History:  Diagnosis Date  . Arthritis   . Cataract    NS OU  . Hypertension   . Stroke [facility])    Past Surgical History:  Procedure Laterality Date  . HIP FRACTURE SURGERY    . LEG AMPUTATION BELOW KNEE     left leg   Social History   Socioeconomic History  . Marital status: Legally Separated    Spouse name: Not on file  . Number of children: Not on file  . Years of education: Not on file  . Highest education level: Not on file  Occupational History  . Not on file  Tobacco Use  . Smoking status: Current Every Day Smoker    Types: Cigarettes  . Smokeless tobacco: Never Used  Substance and Sexual Activity  . Alcohol use: No  . Drug use: No  . Sexual activity: Not on file  Other Topics Concern  . Not on file  Social History Narrative  . Not on file   Social Determinants of Health   Financial Resource Strain:   . Difficulty of Paying Living Expenses:   Food Insecurity:   . Worried About Programme researcher, broadcasting/film/video in the Last Year:   . Barista in the Last Year:   Transportation Needs:   . Freight forwarder (Medical):   Marland Kitchen Lack of Transportation (Non-Medical):   Physical Activity:   . Days of Exercise per Week:   . Minutes of Exercise per Session:   Stress:   . Feeling of Stress :   Social Connections:   . Frequency of Communication with Friends and Family:   . Frequency of Social Gatherings with Friends and Family:   . Attends Religious Services:   . Active Member of Clubs or Organizations:   . Attends Banker Meetings:   Marland Kitchen Marital Status:    Family History  Problem Relation Age of Onset  . Hypertension Mother   . Hypertension Father    Allergies  Allergen Reactions  . Ibuprofen Other (See Comments)     GI Problems   Prior to Admission medications   Medication Sig Start Date End Date Taking? Authorizing Provider  acetaminophen (TYLENOL) 500 MG tablet Take 1,000 mg by mouth every 6 (six) hours as needed for mild pain or headache.   Yes [provider]  aspirin 325 MG EC tablet Take 1 tablet (325 mg total) by mouth daily. 10/19/19  Yes Derrel Nip, MD  atorvastatin (LIPITOR) 80 MG tablet Take 80 mg by mouth daily.   Yes [provider]  hydrochlorothiazide (HYDRODIURIL) 25 MG tablet Take 25 mg by mouth every morning. 01/31/19  Yes [provider]  loratadine (CLARITIN) 10 MG tablet Take 10 mg by mouth daily as needed for allergies.   Yes [provider]  metoprolol succinate (TOPROL-XL) 25 MG 24 hr tablet Take 1 tablet (25 mg total) by mouth daily. 10/19/19  Yes Derrel Nip, MD  olopatadine (PATANOL) 0.1 % ophthalmic solution Place 1 drop into both eyes 2 (two) times daily.  08/02/19  Yes [provider]  Pseudoeph-Doxylamine-DM-APAP (NYQUIL PO) Take 1 Dose by mouth at bedtime.   Yes [provider]  RESTASIS 0.05 % ophthalmic emulsion Place 1 drop into both eyes  2 (two) times daily.  08/02/19  Yes [provider]  acetaminophen-codeine (TYLENOL #3) 300-30 MG tablet Take 1 tablet by mouth every 8 (eight) hours as needed for moderate pain. 10/18/19   Cristie Hem, PA-C  ALPHAGAN P 0.1 % SOLN Place 1 drop into the left eye 3 (three) times daily. 09/01/19   [provider]  amLODipine (NORVASC) 10 MG tablet Take 10 mg by mouth daily. 05/07/19   [provider]  HYDROcodone-acetaminophen (NORCO) 5-325 MG tablet Take 1-2 tablets by mouth daily as needed. Patient not taking: Reported on 10/19/2019 09/28/19   Tarry Kos, MD  lidocaine (LIDODERM) 5 % Place 1 patch onto the skin daily. Remove & Discard patch within 12 hours or as directed by MD Patient not taking: Reported on 10/19/2019 06/01/19   Derrel Nip, MD    nicotine (NICODERM CQ - DOSED IN MG/24 HR) 7 mg/24hr patch Place 1 patch (7 mg total) onto the skin daily. Patient not taking: Reported on 10/19/2019 06/01/19   Derrel Nip, MD     Positive ROS: All other systems have been reviewed and were otherwise negative with the exception of those mentioned in the HPI and as above.  Physical Exam: General: Alert, no acute distress Cardiovascular: No pedal edema Respiratory: No cyanosis, no use of accessory musculature GI: abdomen soft Skin: No lesions in the area of chief complaint Neurologic: Sensation intact distally Psychiatric: Patient is competent for consent with normal mood and affect Lymphatic: no lymphedema  MUSCULOSKELETAL: exam stable  Assessment: Left Femoral Neck Fracture  Plan: Plan for Procedure(s): LEFT TOTAL HIP ARTHROPLASTY, POSTERIOR  The risks benefits and alternatives were discussed with the patient including but not limited to the risks of nonoperative treatment, versus surgical intervention including infection, bleeding, nerve injury,  blood clots, cardiopulmonary complications, morbidity, mortality, among others, and they were willing to proceed.   Preoperative templating of the joint replacement has been completed, documented, and submitted to the Operating Room personnel in order to optimize intra-operative equipment management.   Glee Arvin, MD 10/31/2019 6:59 AM

## 2019-10-31 NOTE — Anesthesia Preprocedure Evaluation (Signed)
Anesthesia Evaluation  Patient identified by MRN, date of birth, ID band Patient awake    Reviewed: Allergy & Precautions, NPO status , Patient's Chart, lab work & pertinent test results  History of Anesthesia Complications Negative for: history of anesthetic complications  Airway Mallampati: II  TM Distance: >3 FB Neck ROM: Full    Dental  (+) Missing, Teeth Intact   Pulmonary Current Smoker and Patient abstained from smoking.,    Pulmonary exam normal        Cardiovascular hypertension, Normal cardiovascular exam     Neuro/Psych CVA, Residual Symptoms negative psych ROS   GI/Hepatic negative GI ROS, Neg liver ROS,   Endo/Other  negative endocrine ROS  Renal/GU negative Renal ROS  negative genitourinary   Musculoskeletal  (+) Arthritis ,   Abdominal   Peds  Hematology  (+) anemia ,   Anesthesia Other Findings  On ASA only, no Plavix or other blood thinners Plts 273, INR 1.1  Reproductive/Obstetrics                             Anesthesia Physical Anesthesia Plan  ASA: III  Anesthesia Plan: Spinal   Post-op Pain Management:    Induction:   PONV Risk Score and Plan: 1 and Propofol infusion, Treatment may vary due to age or medical condition, Ondansetron and TIVA  Airway Management Planned: Nasal Cannula and Simple Face Mask  Additional Equipment: None  Intra-op Plan:   Post-operative Plan:   Informed Consent: I have reviewed the patients History and Physical, chart, labs and discussed the procedure including the risks, benefits and alternatives for the proposed anesthesia with the patient or authorized representative who has indicated his/her understanding and acceptance.       Plan Discussed with:   Anesthesia Plan Comments:         Anesthesia Quick Evaluation

## 2019-10-31 NOTE — Anesthesia Postprocedure Evaluation (Signed)
Anesthesia Post Note  Patient: Kristopher Ruiz.  Procedure(s) Performed: LEFT TOTAL HIP ARTHROPLASTY, POSTERIOR (Left Hip)     Patient location during evaluation: PACU Anesthesia Type: Spinal Level of consciousness: awake and alert and oriented Pain management: pain level controlled Vital Signs Assessment: post-procedure vital signs reviewed and stable Respiratory status: spontaneous breathing, nonlabored ventilation and respiratory function stable Cardiovascular status: blood pressure returned to baseline Postop Assessment: no apparent nausea or vomiting, spinal receding, no headache and no backache Anesthetic complications: no   No complications documented.  Last Vitals:  Vitals:   10/31/19 1545 10/31/19 1600  BP: 104/76 103/81  Pulse: 74 82  Resp: 12 15  Temp:    SpO2: 95% 98%    Last Pain:  Vitals:   10/31/19 1545  TempSrc:   PainSc: 3                  Kaylyn Layer

## 2019-11-01 ENCOUNTER — Other Ambulatory Visit: Payer: Self-pay | Admitting: Physician Assistant

## 2019-11-01 ENCOUNTER — Encounter (HOSPITAL_COMMUNITY): Payer: Self-pay | Admitting: Orthopaedic Surgery

## 2019-11-01 DIAGNOSIS — S72002G Fracture of unspecified part of neck of left femur, subsequent encounter for closed fracture with delayed healing: Secondary | ICD-10-CM | POA: Diagnosis not present

## 2019-11-01 LAB — GLUCOSE, CAPILLARY: Glucose-Capillary: 197 mg/dL — ABNORMAL HIGH (ref 70–99)

## 2019-11-01 LAB — BASIC METABOLIC PANEL
Anion gap: 14 (ref 5–15)
BUN: 14 mg/dL (ref 8–23)
CO2: 22 mmol/L (ref 22–32)
Calcium: 9 mg/dL (ref 8.9–10.3)
Chloride: 98 mmol/L (ref 98–111)
Creatinine, Ser: 1.22 mg/dL (ref 0.61–1.24)
GFR calc Af Amer: >60 mL/min (ref >60)
GFR calc non Af Amer: >60 mL/min (ref >60)
Glucose, Bld: 139 mg/dL — ABNORMAL HIGH (ref 70–99)
Potassium: 3.9 mmol/L (ref 3.5–5.1)
Sodium: 134 mmol/L — ABNORMAL LOW (ref 135–145)

## 2019-11-01 LAB — CBC
HCT: 35.4 % — ABNORMAL LOW (ref 39.0–52.0)
Hemoglobin: 11 g/dL — ABNORMAL LOW (ref 13.0–17.0)
MCH: 27 pg (ref 26.0–34.0)
MCHC: 31.1 g/dL (ref 30.0–36.0)
MCV: 86.8 fL (ref 80.0–100.0)
Platelets: 228 10*3/uL (ref 150–400)
RBC: 4.08 MIL/uL — ABNORMAL LOW (ref 4.22–5.81)
RDW: 16.3 % — ABNORMAL HIGH (ref 11.5–15.5)
WBC: 10.6 10*3/uL — ABNORMAL HIGH (ref 4.0–10.5)
nRBC: 0 % (ref 0.0–0.2)

## 2019-11-01 MED ORDER — METHOCARBAMOL 500 MG PO TABS: 500.0000 mg | ORAL_TABLET | Freq: Four times a day (QID) | ORAL | 0 refills | Status: DC

## 2019-11-01 MED ORDER — ONDANSETRON HCL 4 MG PO TABS: 4.0000 mg | ORAL_TABLET | Freq: Three times a day (TID) | ORAL | 0 refills | Status: DC | PRN

## 2019-11-01 MED ORDER — ENOXAPARIN SODIUM 40 MG/0.4ML ~~LOC~~ SOLN: 40.0000 mg | SUBCUTANEOUS | 14 refills | Status: DC

## 2019-11-01 MED ORDER — OXYCODONE-ACETAMINOPHEN 5-325 MG PO TABS: 1.0000 | ORAL_TABLET | Freq: Four times a day (QID) | ORAL | 0 refills | Status: DC | PRN

## 2019-11-01 MED ORDER — ONDANSETRON HCL 4 MG PO TABS: 4.0000 mg | ORAL_TABLET | Freq: Four times a day (QID) | ORAL | 0 refills | Status: DC | PRN

## 2019-11-01 MED ORDER — METHOCARBAMOL 500 MG PO TABS: 500.0000 mg | ORAL_TABLET | Freq: Two times a day (BID) | ORAL | 0 refills | Status: DC | PRN

## 2019-11-01 MED ORDER — OXYCODONE HCL 5 MG PO TABS: ORAL_TABLET | ORAL | 0 refills | Status: DC

## 2019-11-01 NOTE — Plan of Care (Signed)

## 2019-11-01 NOTE — Progress Notes (Signed)
Subjective: 1 Day Post-Op Procedure(s) (LRB): LEFT TOTAL HIP ARTHROPLASTY, POSTERIOR (Left) Patient reports pain as mild.    Objective: Vital signs in last 24 hours: Temp:  [97.4 F (36.3 C)-98.3 F (36.8 C)] 97.6 F (36.4 C) (08/03 0329) Pulse Rate:  [71-106] 92 (08/03 0329) Resp:  [12-20] 15 (08/03 0329) BP: (100-149)/(70-108) 128/75 (08/03 0329) SpO2:  [95 %-100 %] 98 % (08/03 0329) Weight:  [54.4 kg] 54.4 kg (08/02 1025)  Intake/Output from previous day: 08/02 0701 - 08/03 0700 In: 1420 [P.O.:220; I.V.:1100; IV Piggyback:100] Out: 650 [Urine:450; Blood:200] Intake/Output this shift: No intake/output data recorded.  Recent Labs    10/31/19 1021 10/31/19 2022 11/01/19 0216  HGB 12.7* 11.8* 11.0*   Recent Labs    10/31/19 2022 11/01/19 0216  WBC 12.0* 10.6*  RBC 4.40 4.08*  HCT 38.3* 35.4*  PLT 238 228   Recent Labs    10/31/19 1021 10/31/19 1021 10/31/19 2022 11/01/19 0216  NA 138  --   --  134*  K 3.9  --   --  3.9  CL 100  --   --  98  CO2 26  --   --  22  BUN 9  --   --  14  CREATININE 0.72   < > 0.87 1.22  GLUCOSE 93  --   --  139*  CALCIUM 9.6  --   --  9.0   < > = values in this interval not displayed.   Recent Labs    10/31/19 1021  INR 1.1    Neurologically intact Incision: dressing C/D/I No cellulitis present   Assessment/Plan: 1 Day Post-Op Procedure(s) (LRB): LEFT TOTAL HIP ARTHROPLASTY, POSTERIOR (Left) Advance diet Up with therapy D/C IV fluids Discharge to SNF once insurance approves and bed available WBAT LLE- posterior hip precautions ABLA- mild and stable D/c foley      Cristie Hem 11/01/2019, 7:34 AM

## 2019-11-01 NOTE — Care Management Obs Status (Signed)
MEDICARE OBSERVATION STATUS NOTIFICATION   Patient Details  Name: Kristopher Ruiz. MRN: [id number] Date of Birth: [birth year]   Medicare Observation Status Notification Given:  Yes    Janae Bridgeman, RN 11/01/2019, 1:21 PM

## 2019-11-01 NOTE — TOC Initial Note (Signed)
Transition of Care (TOC) - Initial/Assessment Note    Patient Details  Name: Kristopher Ruiz. MRN: [id number] Date of Birth: [birth year]  Transition of Care [facility]) CM/SW Contact:    Curlene Labrum, RN Phone Number: 11/01/2019, 3:21 PM  Clinical Narrative:                 Case Management met with the patient at the bedside regarding transitions of care to a [facility] facility.  The patient was given Medicare choice regarding SNF placement and the patient will need assistance choosing the facility since the patient stated "I can't read".  The patient was given a Medicare.gov guide and I will work on a SNF workup and present offers to the patient.  The patient states that he has not had the COVID vaccine and email was sent to Jena Gauss to obtain a vaccine for the patient at his requesting.  Will continue to follow for SNF discharge.  Expected Discharge Plan: Skilled Nursing Facility Barriers to Discharge: Insurance Authorization, Continued Medical Work up   Patient Goals and CMS Choice Patient states their goals for this hospitalization and ongoing recovery are:: Plans to discharge to SNF facility for rehab. CMS Medicare.gov Compare Post Acute Care list provided to:: Patient Choice offered to / list presented to : Patient  Expected Discharge Plan and Services Expected Discharge Plan: [place]   Discharge Planning Services: CM Consult Post Acute Care Choice: [place] Living arrangements for the past 2 months: Apartment                                      Prior Living Arrangements/Services Living arrangements for the past 2 months: Apartment Lives with:: Adult Children Patient language and need for interpreter reviewed:: Yes Do you feel safe going back to the place where you live?: Yes      Need for Family Participation in Patient Care: Yes (Comment) Care giver support system in place?: Yes (comment) Current home services: DME (has  rolling walker and WC with no brakes) Criminal Activity/Legal Involvement Pertinent to Current Situation/Hospitalization: No - Comment as needed  Activities of Daily Living Home Assistive Devices/Equipment: Prosthesis, Eyeglasses, Wheelchair, Shower chair with back (left leg prosthetic) ADL Screening (condition at time of admission) Patient's cognitive ability adequate to safely complete daily activities?: Yes Is the patient deaf or have difficulty hearing?: No Does the patient have difficulty seeing, even when wearing glasses/contacts?: No Does the patient have difficulty concentrating, remembering, or making decisions?: No Patient able to express need for assistance with ADLs?: Yes Does the patient have difficulty dressing or bathing?: Yes Independently performs ADLs?: No Communication: Independent Dressing (OT): Independent Grooming: Independent Feeding: Independent Bathing: Independent Toileting: Independent, Independent with device (comment) (prosthetic) In/Out Bed: Independent with device (comment) Walks in Home: Independent with device (comment) (prosthetic) Does the patient have difficulty walking or climbing stairs?: Yes Weakness of Legs: Both (LEFT BKA) Weakness of Arms/Hands: Both  Permission Sought/Granted Permission sought to share information with : Case Manager Permission granted to share information with : Yes, Verbal Permission Granted     Permission granted to share info w AGENCY: SNF  Permission granted to share info w Relationship: step daughter     Emotional Assessment Appearance:: Appears stated age Attitude/Demeanor/Rapport: Gracious Affect (typically observed): Accepting Orientation: : Oriented to Self, Oriented to Place, Oriented to  Time, Oriented to Situation Alcohol / Substance Use: Tobacco  Use Psych Involvement: No (comment)  Admission diagnosis:  Status post total replacement of left hip [Z96.642] Patient Active Problem List   Diagnosis Date  Noted  . 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 Closed displaced fracture of left femoral neck with delayed healing 09/28/2019  . Establishing care with new doctor, encounter for 06/01/2019  . TIA (transient ischemic attack) 09/13/2018  . 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 Hypoglycemia due to insulin 09/06/2018  . Tobacco abuse 09/06/2018  . Hyperlipidemia 09/06/2018  . Essential hypertension 09/06/2018  . 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[place]) 04/16/2017   PCP:  Gifford Shave, MD Pharmacy:   [id number] [place], [id number]: [phone]: [place] [place] [address]Phone: [phone]: [phone]    Social Determinants of Health ([place] Interventions    Readmission Risk Interventions No flowsheet data found.

## 2019-11-01 NOTE — NC FL2 (Signed)
[place] MEDICAID FL2 LEVEL OF CARE SCREENING TOOL     IDENTIFICATION  Patient Name: Kristopher Ruiz. Birthdate: June 17, 1950 Sex: male Admission Date (Current Location): 10/31/2019  [facility] and [place] Number:  Producer, television/film/video and Address:  [facility], [address], [place], [place] [address]      Provider Number: [id number]  Attending Physician Name and Address:  Tarry Kos, MD  Relative Name and Phone Number:  Argie Ramming - [phone]    Current Level of Care: Hospital Recommended Level of Care: Skilled Nursing Facility Prior Approval Number:    Date Approved/Denied:   PASRR Number: [id number]  Discharge Plan: SNF    Current Diagnoses: Patient Active Problem List   Diagnosis Date Noted  . 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 Closed displaced fracture of left femoral neck with delayed healing 09/28/2019  . Establishing care with new doctor, encounter for 06/01/2019  . TIA (transient ischemic attack) 09/13/2018  . Cerebellar stroke ([facility]) 09/06/2018  . Hypoglycemia due to insulin 09/06/2018  . Tobacco abuse 09/06/2018  . Hyperlipidemia 09/06/2018  . Essential hypertension 09/06/2018  . 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[facility]) 04/16/2017    Orientation RESPIRATION BLADDER Height & Weight     Self, Time, Situation, Place  Normal Continent Weight: 54.4 kg Height:  5\' 3"  (160 cm)  BEHAVIORAL SYMPTOMS/MOOD NEUROLOGICAL BOWEL NUTRITION STATUS      Continent Diet (See discharge summary)  AMBULATORY STATUS COMMUNICATION OF NEEDS Skin   Extensive Assist Verbally Surgical wounds                       Personal Care Assistance Level of Assistance  Bathing, Dressing Bathing Assistance: Limited assistance   Dressing Assistance: Limited assistance     Functional Limitations Info  Sight, Hearing, Speech Sight Info: Impaired Hearing Info: Adequate Speech Info: Adequate    SPECIAL CARE FACTORS  FREQUENCY  PT (By licensed PT), OT (By licensed OT)     PT Frequency: 5 x per week OT Frequency: 5 x per week            Contractures Contractures Info: Not present    Additional Factors Info  Code Status, Allergies Code Status Info: Full code Allergies Info: Ibuprofen           Current Medications (11/01/2019):  This is the current hospital active medication list Current Facility-Administered Medications  Medication Dose Route Frequency Provider Last Rate Last Admin  . 0.9 %  sodium chloride infusion   Intravenous Continuous 01/01/2020, MD 75 mL/hr at 11/01/19 1141 New Bag at 11/01/19 1141  . acetaminophen (TYLENOL) tablet 325-650 mg  325-650 mg Oral Q6H PRN 01/01/20, MD      . alum & mag hydroxide-simeth (MAALOX/MYLANTA) 200-200-20 MG/5ML suspension 30 mL  30 mL Oral Q4H PRN 10-07-2000, MD      . amLODipine (NORVASC) tablet 10 mg  10 mg Oral Daily Tarry Kos, MD   10 mg at 11/01/19 0905  . atorvastatin (LIPITOR) tablet 80 mg  80 mg Oral Daily 01/01/20, MD   80 mg at 11/01/19 0905  . cycloSPORINE (RESTASIS) 0.05 % ophthalmic emulsion 1 drop  1 drop Both Eyes BID 01/01/20, MD   1 drop at 11/01/19 1101  . diphenhydrAMINE (BENADRYL) 12.5 MG/5ML elixir 25 mg  25 mg Oral Q4H PRN 01/01/20, MD      .  docusate sodium (COLACE) capsule 100 mg  100 mg Oral BID Tarry Kos, MD   100 mg at 11/01/19 0905  . enoxaparin (LOVENOX) injection 40 mg  40 mg Subcutaneous Q24H Tarry Kos, MD   40 mg at 11/01/19 0905  . gabapentin (NEURONTIN) capsule 300 mg  300 mg Oral TID Tarry Kos, MD   300 mg at 11/01/19 0905  . hydrochlorothiazide (HYDRODIURIL) tablet 25 mg  25 mg Oral q morning - 10a Tarry Kos, MD   25 mg at 11/01/19 0905  . HYDROmorphone (DILAUDID) injection 0.5-1 mg  0.5-1 mg Intravenous Q4H PRN Tarry Kos, MD      . magnesium citrate solution 1 Bottle  1 Bottle Oral Once PRN Tarry Kos, MD      . menthol-cetylpyridinium (CEPACOL) lozenge 3 mg  1  lozenge Oral PRN Tarry Kos, MD       Or  . phenol (CHLORASEPTIC) mouth spray 1 spray  1 spray Mouth/Throat PRN Tarry Kos, MD      . metoCLOPramide (REGLAN) tablet 5-10 mg  5-10 mg Oral Q8H PRN Tarry Kos, MD       Or  . metoCLOPramide (REGLAN) injection 5-10 mg  5-10 mg Intravenous Q8H PRN Tarry Kos, MD      . metoprolol succinate (TOPROL-XL) 24 hr tablet 25 mg  25 mg Oral Daily Tarry Kos, MD   25 mg at 11/01/19 0905  . olopatadine (PATANOL) 0.1 % ophthalmic solution 1 drop  1 drop Both Eyes BID Tarry Kos, MD   1 drop at 11/01/19 0906  . ondansetron (ZOFRAN) tablet 4 mg  4 mg Oral Q6H PRN Tarry Kos, MD       Or  . ondansetron [facility]) injection 4 mg  4 mg Intravenous Q6H PRN Tarry Kos, MD      . oxyCODONE (Oxy IR/ROXICODONE) immediate release tablet 10-15 mg  10-15 mg Oral Q4H PRN Tarry Kos, MD   15 mg at 11/01/19 0254  . oxyCODONE (Oxy IR/ROXICODONE) immediate release tablet 5-10 mg  5-10 mg Oral Q4H PRN Tarry Kos, MD      . oxyCODONE (OXYCONTIN) 12 hr tablet 10 mg  10 mg Oral Q12H Tarry Kos, MD   10 mg at 11/01/19 0905  . polyethylene glycol (MIRALAX / GLYCOLAX) packet 17 g  17 g Oral Daily PRN Tarry Kos, MD      . sorbitol 70 % solution 30 mL  30 mL Oral Daily PRN Tarry Kos, MD         Discharge Medications: Please see discharge summary for a list of discharge medications.  Relevant Imaging Results:  Relevant Lab Results:   Additional Information SS# [id number]  Janae Bridgeman, RN

## 2019-11-01 NOTE — Evaluation (Addendum)
Physical Therapy Evaluation Patient Details Name: Kristopher Ruiz. MRN: [id number] DOB: 01/19/1951 Today's Date: 11/01/2019   History of Present Illness  Kristopher Ruiz. is a 69 y.o. male who presents for surgical treatment of Left Femoral Neck Fracture.  He denies any changes in medical history. PMH includesL HTN, hyperlipidemia, CVA, L BKA.    Clinical Impression  Patient received in bed. Reports " I just had surgery yesterday." Agrees to PT assessment. Reports significant pain, but moving well. Mod independent with bed mobility. Transfers with min guard sit to stand. Requires increased standing time to ensure balanced. Patient hopped from bed to recliner 3' using RW and min assist. He declines further mobility due to pain at this time. Did not want to don prosthetic for mobility this date due to pain. Encouraged patient to try using tomorrow for early WB and healing. Patient will continue to benefit from skilled PT while here to improve functional independence and safety with mobility.         Follow Up Recommendations SNF;Supervision for mobility/OOB    Equipment Recommendations  Rolling walker with 5" wheels;Other (comment) (TBD)    Recommendations for Other Services       Precautions / Restrictions Precautions Precautions: Fall Precaution Comments: mod fall Restrictions Weight Bearing Restrictions: Yes LLE Weight Bearing: Weight bearing as tolerated      Mobility  Bed Mobility Overal bed mobility: Modified Independent             General bed mobility comments: patient getting himself to edge of bed prior to my being ready. Requires cues to wait for PT to be ready.  Transfers Overall transfer level: Needs assistance Equipment used: Rolling walker (2 wheeled) Transfers: Sit to/from Stand Sit to Stand: Min guard         General transfer comment: cues for hand placement. No real physical assist needed for boosting up to standing.  Ambulation/Gait Ambulation/Gait  assistance: Min assist Gait Distance (Feet): 3 Feet Assistive device: Rolling walker (2 wheeled) Gait Pattern/deviations: Step-to pattern Gait velocity: decr   General Gait Details: Patient hopped from bed to recliner. Did not want to attempt to put prosthetic on yet due to pain.  Stairs            Wheelchair Mobility    Modified Rankin (Stroke Patients Only)       Balance Overall balance assessment: Needs assistance Sitting-balance support: Feet supported Sitting balance-Leahy Scale: Good     Standing balance support: Bilateral upper extremity supported;During functional activity Standing balance-Leahy Scale: Fair Standing balance comment: reliant on RW and external support for safety at this time                             Pertinent Vitals/Pain Pain Assessment: 0-10 Pain Score: 7  Pain Location: L hip Pain Descriptors / Indicators: Sore;Discomfort Pain Intervention(s): Monitored during session;Repositioned;Limited activity within patient's tolerance;Premedicated before session    Home Living Family/patient expects to be discharged to:: Skilled nursing facility Living Arrangements: Children               Additional Comments: patient was stying with daughter but planning to get his own place after discharge from rehab    Prior Function Level of Independence: Independent with assistive device(s)         Comments: has L LE prosthesis.     Hand Dominance        Extremity/Trunk Assessment   Upper Extremity Assessment  Upper Extremity Assessment: Overall WFL for tasks assessed    Lower Extremity Assessment Lower Extremity Assessment: Overall WFL for tasks assessed    Cervical / Trunk Assessment Cervical / Trunk Assessment: Normal  Communication   Communication: Other (comment) (gravely voice which is sometimes difficult to understand)  Cognition Arousal/Alertness: Awake/alert Behavior During Therapy: WFL for tasks  assessed/performed Overall Cognitive Status: Within Functional Limits for tasks assessed                                        General Comments      Exercises Total Joint Exercises Ankle Circles/Pumps: AROM;10 reps Heel Slides: AROM;Left;10 reps Hip ABduction/ADduction: AROM;10 reps;Left Long Arc Quad: AROM;Left;10 reps   Assessment/Plan    PT Assessment Patient needs continued PT services  PT Problem List Decreased strength;Decreased mobility;Decreased safety awareness;Decreased activity tolerance;Decreased balance;Pain;Decreased knowledge of precautions;Decreased knowledge of use of DME       PT Treatment Interventions DME instruction;Therapeutic activities;Gait training;Therapeutic exercise;Patient/family education;Balance training;Functional mobility training    PT Goals (Current goals can be found in the Care Plan section)  Acute Rehab PT Goals Patient Stated Goal: to go to rehab then home PT Goal Formulation: With patient Time For Goal Achievement: 11/08/19 Potential to Achieve Goals: Good    Frequency 7X/week   Barriers to discharge Decreased caregiver support      Co-evaluation               AM-PAC PT "6 Clicks" Mobility  Outcome Measure Help needed turning from your back to your side while in a flat bed without using bedrails?: None Help needed moving from lying on your back to sitting on the side of a flat bed without using bedrails?: None Help needed moving to and from a bed to a chair (including a wheelchair)?: A Little Help needed standing up from a chair using your arms (e.g., wheelchair or bedside chair)?: A Little Help needed to walk in hospital room?: A Lot Help needed climbing 3-5 steps with a railing? : A Lot 6 Click Score: 18    End of Session Equipment Utilized During Treatment: Gait belt Activity Tolerance: Patient tolerated treatment well;Patient limited by pain Patient left: in chair;with call bell/phone within  reach;with chair alarm set;with family/visitor present Nurse Communication: Mobility status PT Visit Diagnosis: Unsteadiness on feet (R26.81);Muscle weakness (generalized) (M62.81);Other abnormalities of gait and mobility (R26.89);Pain;Difficulty in walking, not elsewhere classified (R26.2) Pain - Right/Left: Left Pain - part of body: Hip    Time: 1130-1200 PT Time Calculation (min) (ACUTE ONLY): 30 min   Charges:   PT Evaluation $PT Eval Moderate Complexity: 1 Mod PT Treatments $Gait Training: 8-22 mins        Iman Reinertsen, PT, GCS 11/01/19,12:49 PM

## 2019-11-02 ENCOUNTER — Observation Stay: Payer: Medicare Other

## 2019-11-02 DIAGNOSIS — Z23 Encounter for immunization: Secondary | ICD-10-CM

## 2019-11-02 DIAGNOSIS — S72002G Fracture of unspecified part of neck of left femur, subsequent encounter for closed fracture with delayed healing: Secondary | ICD-10-CM | POA: Diagnosis not present

## 2019-11-02 LAB — GLUCOSE, CAPILLARY: Glucose-Capillary: 127 mg/dL — ABNORMAL HIGH (ref 70–99)

## 2019-11-02 NOTE — Progress Notes (Signed)
Subjective: 2 Days Post-Op Procedure(s) (LRB): LEFT TOTAL HIP ARTHROPLASTY, POSTERIOR (Left) Patient reports pain as moderate.    Objective: Vital signs in last 24 hours: Temp:  [97.6 F (36.4 C)-98.3 F (36.8 C)] 97.6 F (36.4 C) (08/04 0518) Pulse Rate:  [84-92] 90 (08/04 0518) Resp:  [16-20] 16 (08/04 0518) BP: (119-148)/(87-92) 148/92 (08/04 0518) SpO2:  [95 %-100 %] 99 % (08/04 0518)  Intake/Output from previous day: 08/03 0701 - 08/04 0700 In: 2033.8 [I.V.:2033.8] Out: -  Intake/Output this shift: No intake/output data recorded.  Recent Labs    10/31/19 1021 10/31/19 2022 11/01/19 0216  HGB 12.7* 11.8* 11.0*   Recent Labs    10/31/19 2022 11/01/19 0216  WBC 12.0* 10.6*  RBC 4.40 4.08*  HCT 38.3* 35.4*  PLT 238 228   Recent Labs    10/31/19 1021 10/31/19 1021 10/31/19 2022 11/01/19 0216  NA 138  --   --  134*  K 3.9  --   --  3.9  CL 100  --   --  98  CO2 26  --   --  22  BUN 9  --   --  14  CREATININE 0.72   < > 0.87 1.22  GLUCOSE 93  --   --  139*  CALCIUM 9.6  --   --  9.0   < > = values in this interval not displayed.   Recent Labs    10/31/19 1021  INR 1.1    Neurologically intact Neurovascular intact Sensation intact distally Intact pulses distally Dorsiflexion/Plantar flexion intact Incision: dressing C/D/I No cellulitis present Compartment soft   Assessment/Plan: 2 Days Post-Op Procedure(s) (LRB): LEFT TOTAL HIP ARTHROPLASTY, POSTERIOR (Left) Advance diet Up with therapy D/C IV fluids Discharge to SNF once bed available WBAT LLE ABLA- mild and stable      Cristie Hem 11/02/2019, 8:01 AM

## 2019-11-02 NOTE — Plan of Care (Signed)

## 2019-11-02 NOTE — Progress Notes (Signed)
Physical Therapy Treatment Patient Details Name: Kristopher Ruiz. MRN: [id number] DOB: 31-Jan-1951 Today's Date: 11/02/2019    History of Present Illness Pt is a 69 y.o. male admitted 10/31/19 with chronic L femoral neck fx. S/p L THA (posterior approach) on 8/2. PMH includes L BKA (traumatic), CVA, HTN.   PT Comments    Pt progressing with mobility. Continues to decline LLE prosthetic wear secondary to L hip and knee pain. Despite this, agreeable to transfer training and able to hop on RLE with RW and modA. Pt limited by generalized weakness, pain and impaired balance strategies/postural reactions, requiring frequent assist to prevent LOB. Continue to recommend SNF-level therapies to maximize functional mobility and independence prior to return home.    Follow Up Recommendations  SNF;Supervision for mobility/OOB     Equipment Recommendations   (defer to next venue)    Recommendations for Other Services       Precautions / Restrictions Precautions Precautions: Fall;Posterior Hip Precaution Booklet Issued: Yes (comment) Precaution Comments: Pt cannot read - provided hip precaution education handout that included illustrations Restrictions Weight Bearing Restrictions: Yes LLE Weight Bearing: Weight bearing as tolerated Other Position/Activity Restrictions: H/o L BKA    Mobility  Bed Mobility Overal bed mobility: Modified Independent                Transfers Overall transfer level: Needs assistance Equipment used: Rolling walker (2 wheeled) Transfers: Sit to/from Stand Sit to Stand: Mod assist         General transfer comment: ModA for trunk elevation and to maintain stability; multiple trials from bed and recliner. Cues for hand placement as pt reliant on momentum and pulling up on RW requiring assist to keep walker from falling  Ambulation/Gait Ambulation/Gait assistance: Mod assist Gait Distance (Feet): 4 Feet Assistive device: Rolling walker (2 wheeled)        General Gait Details: Pt able to hop on RLE with RW and modA for balance. Pt declined donning LLE prosthetic secondary to knee pain despite encouragement; deferred further distance due to pain as well   Stairs             Wheelchair Mobility    Modified Rankin (Stroke Patients Only)       Balance Overall balance assessment: Needs assistance Sitting-balance support: Feet supported Sitting balance-Leahy Scale: Fair       Standing balance-Leahy Scale: Poor Standing balance comment: Reliant on UE support and external assist to maintain static and standing balance                            Cognition Arousal/Alertness: Awake/alert Behavior During Therapy: WFL for tasks assessed/performed Overall Cognitive Status: Within Functional Limits for tasks assessed     General Comments: WFL for simple tasks, not formally assessed; some decreased attention noted, likely baseline cognition. Appreciative of education      Exercises Amputee Exercises Hip ABduction/ADduction: AROM;Left;Seated Knee Flexion: AROM;Left;Seated Straight Leg Raises: AROM;Left;Seated (reclined to maintain hip precautions)    General Comments General comments (skin integrity, edema, etc.): Increased time educating on importance of maintaining posterior hip precautions as pt flexing forward and adducting hip at times during session, max cues to correct. Pt appreciative of illustration handout for educ      Pertinent Vitals/Pain Pain Assessment: Faces Faces Pain Scale: Hurts even more Pain Location: L anterior knee > L hip Pain Descriptors / Indicators: Sore;Discomfort Pain Intervention(s): Monitored during session;Limited activity within patient's tolerance;Ice applied  Home Living                      Prior Function            PT Goals (current goals can now be found in the care plan section) Progress towards PT goals: Progressing toward goals    Frequency    Min  3X/week      PT Plan Frequency needs to be updated    Co-evaluation              AM-PAC PT "6 Clicks" Mobility   Outcome Measure  Help needed turning from your back to your side while in a flat bed without using bedrails?: None Help needed moving from lying on your back to sitting on the side of a flat bed without using bedrails?: None Help needed moving to and from a bed to a chair (including a wheelchair)?: A Lot Help needed standing up from a chair using your arms (e.g., wheelchair or bedside chair)?: A Lot Help needed to walk in hospital room?: A Lot Help needed climbing 3-5 steps with a railing? : A Lot 6 Click Score: 16    End of Session Equipment Utilized During Treatment: Gait belt Activity Tolerance: Patient tolerated treatment well;Patient limited by pain Patient left: in chair;with call bell/phone within reach;with chair alarm set Nurse Communication: Mobility status PT Visit Diagnosis: Unsteadiness on feet (R26.81);Muscle weakness (generalized) (M62.81);Other abnormalities of gait and mobility (R26.89);Pain Pain - Right/Left: Left Pain - part of body: Hip;Knee     Time: 1610-9604 PT Time Calculation (min) (ACUTE ONLY): 19 min  Charges:  $Therapeutic Activity: 8-22 mins                     Ina Homes, PT, DPT Acute Rehabilitation Services  Pager [phone] Office [phone]  Malachy Chamber 11/02/2019, 4:55 PM

## 2019-11-02 NOTE — Discharge Summary (Addendum)
Patient ID: Kristopher Ruiz. MRN: [id number] DOB/AGE: 1950/07/22 69 y.o.  Admit date: 10/31/2019 Discharge date: 11/03/2019  Admission Diagnoses:  Principal Problem:   Closed displaced fracture of left femoral neck with delayed healing Active Problems:   Status post total replacement of left hip   Discharge Diagnoses:  Same  Past Medical History:  Diagnosis Date   Arthritis    Cataract    NS OU   Hypertension    Stroke [facility])     Surgeries: Procedure(s): LEFT TOTAL HIP ARTHROPLASTY, POSTERIOR on 10/31/2019   Consultants:   Discharged Condition: Improved  Hospital Course: Kristopher Ruiz. is an 69 y.o. male who was admitted 10/31/2019 for operative treatment ofClosed displaced fracture of left femoral neck with delayed healing. Patient has severe unremitting pain that affects sleep, daily activities, and work/hobbies. After pre-op clearance the patient was taken to the operating room on 10/31/2019 and underwent  Procedure(s): LEFT TOTAL HIP ARTHROPLASTY, POSTERIOR.    Patient was given perioperative antibiotics:  Anti-infectives (From admission, onward)   Start     Dose/Rate Route Frequency Ordered Stop   10/31/19 1945  ceFAZolin (ANCEF) IVPB 2g/100 mL premix        2 g 200 mL/hr over 30 Minutes Intravenous Every 6 hours 10/31/19 1935 11/01/19 1140   10/31/19 1336  vancomycin (VANCOCIN) powder  Status:  Discontinued          As needed 10/31/19 1336 10/31/19 1512   10/31/19 1015  ceFAZolin (ANCEF) IVPB 2g/100 mL premix        2 g 200 mL/hr over 30 Minutes Intravenous On call to O.R. 10/31/19 1011 10/31/19 1305       Patient was given sequential compression devices, early ambulation, and chemoprophylaxis to prevent DVT.  Patient benefited maximally from hospital stay and there were no complications.    Recent vital signs:  Patient Vitals for the past 24 hrs:  BP Temp Temp src Pulse Resp SpO2  11/03/19 0708 134/76 98.5 F (36.9 C) Oral 91 18 98 %  11/03/19 0401 129/75  98.6 F (37 C) Oral 97 20 94 %  11/02/19 1946 128/86 97.7 F (36.5 C) Oral 91 19 100 %  11/02/19 1432 113/80 98.6 F (37 C) Oral 93 17 99 %     Recent laboratory studies:  Recent Labs    10/31/19 1021 10/31/19 1021 10/31/19 2022 11/01/19 0216  WBC 4.7   < > 12.0* 10.6*  HGB 12.7*   < > 11.8* 11.0*  HCT 41.2   < > 38.3* 35.4*  PLT 273   < > 238 228  NA 138  --   --  134*  K 3.9  --   --  3.9  CL 100  --   --  98  CO2 26  --   --  22  BUN 9  --   --  14  CREATININE 0.72   < > 0.87 1.22  GLUCOSE 93  --   --  139*  INR 1.1  --   --   --   CALCIUM 9.6   < >  --  9.0   < > = values in this interval not displayed.     Discharge Medications:   Allergies as of 11/03/2019      Reactions   Ibuprofen Other (See Comments)   GI Problems      Medication List    STOP taking these medications   acetaminophen 500 MG tablet Commonly known as: TYLENOL  acetaminophen-codeine 300-30 MG tablet Commonly known as: TYLENOL #3   aspirin 325 MG EC tablet   docusate sodium 100 MG capsule Commonly known as: Colace   HYDROcodone-acetaminophen 5-325 MG tablet Commonly known as: Norco   oxyCODONE 5 MG immediate release tablet Commonly known as: Roxicodone     TAKE these medications   Alphagan P 0.1 % Soln Generic drug: brimonidine Place 1 drop into the left eye 3 (three) times daily.   amLODipine 10 MG tablet Commonly known as: NORVASC Take 10 mg by mouth daily.   atorvastatin 80 MG tablet Commonly known as: LIPITOR Take 80 mg by mouth daily.   enoxaparin 40 MG/0.4ML injection Commonly known as: LOVENOX Inject 0.4 mLs (40 mg total) into the skin daily.   hydrochlorothiazide 25 MG tablet Commonly known as: HYDRODIURIL Take 25 mg by mouth every morning.   lidocaine 5 % Commonly known as: Lidoderm Place 1 patch onto the skin daily. Remove & Discard patch within 12 hours or as directed by MD   loratadine 10 MG tablet Commonly known as: CLARITIN Take 10 mg by mouth  daily as needed for allergies.   methocarbamol 500 MG tablet Commonly known as: Robaxin Take 1 tablet (500 mg total) by mouth 4 (four) times daily. What changed:   when to take this  reasons to take this   methocarbamol 500 MG tablet Commonly known as: Robaxin Take 1 tablet (500 mg total) by mouth 4 (four) times daily. What changed: You were already taking a medication with the same name, and this prescription was added. Make sure you understand how and when to take each.   metoprolol succinate 25 MG 24 hr tablet Commonly known as: TOPROL-XL Take 1 tablet (25 mg total) by mouth daily.   nicotine 7 mg/24hr patch Commonly known as: NICODERM CQ - dosed in mg/24 hr Place 1 patch (7 mg total) onto the skin daily.   NYQUIL PO Take 1 Dose by mouth at bedtime.   olopatadine 0.1 % ophthalmic solution Commonly known as: PATANOL Place 1 drop into both eyes 2 (two) times daily.   ondansetron 4 MG tablet Commonly known as: ZOFRAN Take 1 tablet (4 mg total) by mouth every 6 (six) hours as needed for nausea. What changed:   when to take this  reasons to take this   oxyCODONE-acetaminophen 5-325 MG tablet Commonly known as: Percocet Take 1-2 tablets by mouth every 6 (six) hours as needed for severe pain.   Restasis 0.05 % ophthalmic emulsion Generic drug: cycloSPORINE Place 1 drop into both eyes 2 (two) times daily.            Durable Medical Equipment  (From admission, onward)         Start     Ordered   10/31/19 1935  DME Walker rolling  Once       Question:  Patient needs a walker to treat with the following condition  Answer:  History of hip replacement   10/31/19 1935   10/31/19 1935  DME 3 n 1  Once        10/31/19 1935   10/31/19 1935  DME Bedside commode  Once       Question:  Patient needs a bedside commode to treat with the following condition  Answer:  History of hip replacement   10/31/19 1935          Diagnostic Studies: DG Chest 2 View  Result  Date: 10/31/2019 CLINICAL DATA:  Preop for left hip  surgery. EXAM: CHEST - 2 VIEW COMPARISON:  None. FINDINGS: The heart size and mediastinal contours are within normal limits. No pneumothorax or pleural effusion is noted. Left lung is clear. Rounded density is seen in the right midlung region which is very dense and may represent either calcified granuloma or possibly heterotopic bone formation related to adjacent right rib fracture. IMPRESSION: Rounded density seen in right midlung which most likely represents either calcified granuloma or heterotopic bone formation related to adjacent old right rib fracture. No definite acute abnormality is noted. Aortic Atherosclerosis (ICD10-I70.0). Electronically Signed   By: Lupita Raider M.D.   On: 10/31/2019 11:09   DG Pelvis Portable  Result Date: 10/31/2019 CLINICAL DATA:  Post left hip replacement. EXAM: PORTABLE PELVIS 1-2 VIEWS COMPARISON:  October 01, 2011 FINDINGS: The patient has undergone total hip arthroplasty on the left. The alignment is appropriate. There are expected postsurgical changes. There is no periprosthetic fracture. The patient is status post prior ORIF of the proximal right femur. IMPRESSION: Expected postsurgical changes related to total hip arthroplasty on the left. Electronically Signed   By: Katherine Mantle M.D.   On: 10/31/2019 16:39   OCT, Retina - OU - Both Eyes  Result Date: 10/10/2019 Right Eye Quality was good. Central Foveal Thickness: 247. Progression has been stable. Findings include normal foveal contour, no IRF, no SRF, vitreomacular adhesion . Left Eye Quality was good. Central Foveal Thickness: 252. Progression has been stable. Findings include normal foveal contour, no IRF, no SRF (Mild ERM). Notes *Images captured and stored on drive Diagnosis / Impression: NFP, no IRF/SRF OU Clinical management: See below Abbreviations: NFP - Normal foveal profile. CME - cystoid macular edema. PED - pigment epithelial detachment. IRF -  intraretinal fluid. SRF - subretinal fluid. EZ - ellipsoid zone. ERM - epiretinal membrane. ORA - outer retinal atrophy. ORT - outer retinal tubulation. SRHM - subretinal hyper-reflective material    Disposition: Discharge disposition: 03-Skilled Nursing Facility          Follow-up Information    Tarry Kos, MD. Schedule an appointment as soon as possible for a visit in 2 week(s).   Specialty: Orthopedic Surgery Contact information: [address] [place] [address] [phone]                Signed: Cristie Hem 11/03/2019, 8:12 AM

## 2019-11-02 NOTE — TOC Progression Note (Addendum)
Transition of Care (TOC) - Progression Note    Patient Details  Name: Kristopher Ruiz. MRN: [id number] Date of Birth: 10/02/1950  Transition of Care (TOC) CM/SW Contact  Michelle R Stubbldfield, RN Phone Number: 11/02/2019, 2:23 PM  Clinical Narrative:    Case management met with the patient to determine Medicare choice regarding SNF placement and patient's sister chose 1. [facility], 2. [facility], and 3. [facility].  I called and started insurance authorization for the patient with [employer] Medicare ref # [id number].  [facility] was unable to accept the patient until next week since patient has to be in quarentine, [facility] is not accepting patients this week since there was an exposure at the facility.  [facility] was called and is following up to possibly accept the patient once the insurance is approved.  The patient received his first dose of Moderna vaccine today and will receive the next dose 4 weeks from now.  Will continue to follow for transition to a SNF.  8/4- 1500 - [facility] called back and was full and unable to accept the patient.  [facility] called and they are saving a room for the patient. - Clinicals faxed to [employer] with pending bed choice - Will clarify with them that [facility] is the only SNF with an open bed for the patient but the sister is in agreement for placement there when approved.   Expected Discharge Plan: Skilled Nursing Facility Barriers to Discharge: Insurance Authorization, Continued Medical Work up  Expected Discharge Plan and Services Expected Discharge Plan: Skilled Nursing Facility   Discharge Planning Services: CM Consult Post Acute Care Choice: Skilled Nursing Facility Living arrangements for the past 2 months: Apartment Expected Discharge Date: 11/02/19                                     Social Determinants of Health (SDOH) Interventions    Readmission Risk  Interventions No flowsheet data found.  

## 2019-11-02 NOTE — Plan of Care (Signed)

## 2019-11-02 NOTE — Progress Notes (Signed)
   [id number]  Name:  Kristopher Ruiz.    MRN: [id number] DOB: 07/14/1950  11/02/2019  Mr. [name] was observed post Covid-19 immunization for 15 minutes without incident. He was provided with Vaccine Information Sheet and instruction to access the V-Safe system.   Mr. [name] was instructed to call 911 with any severe reactions post vaccine: Marland Kitchen Difficulty breathing  . Swelling of face and throat  . A fast heartbeat  . A bad rash all over body  . Dizziness and weakness   Immunizations Administered    Name Date Dose VIS Date Route   Moderna COVID-19 Vaccine 11/02/2019 11:50 AM 0.5 mL 03/2019 Intramuscular   Manufacturer: Moderna   Lot: [id number]   NDC: 61443-154-00

## 2019-11-02 NOTE — Plan of Care (Signed)

## 2019-11-03 DIAGNOSIS — S72002G Fracture of unspecified part of neck of left femur, subsequent encounter for closed fracture with delayed healing: Secondary | ICD-10-CM | POA: Diagnosis not present

## 2019-11-03 NOTE — Progress Notes (Signed)
Subjective: 3 Days Post-Op Procedure(s) (LRB): LEFT TOTAL HIP ARTHROPLASTY, POSTERIOR (Left) Patient reports pain as moderate.    Objective: Vital signs in last 24 hours: Temp:  [97.7 F (36.5 C)-98.6 F (37 C)] 98.5 F (36.9 C) (08/05 0708) Pulse Rate:  [91-97] 91 (08/05 0708) Resp:  [17-20] 18 (08/05 0708) BP: (113-134)/(75-86) 134/76 (08/05 0708) SpO2:  [94 %-100 %] 98 % (08/05 0708)  Intake/Output from previous day: 08/04 0701 - 08/05 0700 In: 761.4 [P.O.:720; I.V.:41.4] Out: 1340 [Urine:1340] Intake/Output this shift: No intake/output data recorded.  Recent Labs    10/31/19 1021 10/31/19 2022 11/01/19 0216  HGB 12.7* 11.8* 11.0*   Recent Labs    10/31/19 2022 11/01/19 0216  WBC 12.0* 10.6*  RBC 4.40 4.08*  HCT 38.3* 35.4*  PLT 238 228   Recent Labs    10/31/19 1021 10/31/19 1021 10/31/19 2022 11/01/19 0216  NA 138  --   --  134*  K 3.9  --   --  3.9  CL 100  --   --  98  CO2 26  --   --  22  BUN 9  --   --  14  CREATININE 0.72   < > 0.87 1.22  GLUCOSE 93  --   --  139*  CALCIUM 9.6  --   --  9.0   < > = values in this interval not displayed.   Recent Labs    10/31/19 1021  INR 1.1    Neurologically intact Neurovascular intact Sensation intact distally Intact pulses distally Dorsiflexion/Plantar flexion intact Incision: scant drainage No cellulitis present Compartment soft   Assessment/Plan: 3 Days Post-Op Procedure(s) (LRB): LEFT TOTAL HIP ARTHROPLASTY, POSTERIOR (Left) Advance diet Up with therapy D/C IV fluids Discharge to SNF once bed available WBAT LLE Posterior hip precautions      Cristie Hem 11/03/2019, 8:11 AM

## 2019-11-03 NOTE — TOC Transition Note (Signed)
Transition of Care [facility]) - CM/SW Discharge Note   Patient Details  Name: Kristopher Ruiz. MRN: [id number] Date of Birth: January 16, 1951  Transition of Care [facility]) CM/SW Contact:  Janae Bridgeman, RN Phone Number: 11/03/2019, 12:38 PM   Clinical Narrative:    Received authorization approval for this patient for placement at [facility] today - auth approval # [id number].  The patient was notified along with the patient's daughter, Kristopher Ruiz.  PTAR was arranged for 2 pm today for transport to the facility.  Will continue to follow for discharge and transfer.   Final next level of care: Skilled Nursing Facility Barriers to Discharge: English as a second language teacher, Continued Medical Work up   Patient Goals and CMS Choice Patient states their goals for this hospitalization and ongoing recovery are:: Plans to discharge to SNF facility for rehab. CMS Medicare.gov Compare Post Acute Care list provided to:: Patient Choice offered to / list presented to : Patient  Discharge Placement                       Discharge Plan and Services   Discharge Planning Services: CM Consult Post Acute Care Choice: Skilled Nursing Facility                               Social Determinants of Health (SDOH) Interventions     Readmission Risk Interventions No flowsheet data found.

## 2019-11-03 NOTE — Plan of Care (Signed)
  Problem: Health Behavior/Discharge Planning: Goal: Ability to manage health-related needs will improve Outcome: Adequate for Discharge   Problem: Pain Managment: Goal: General experience of comfort will improve Outcome: Adequate for Discharge   Problem: Safety: Goal: Ability to remain free from injury will improve Outcome: Adequate for Discharge   

## 2019-11-03 NOTE — Progress Notes (Signed)
Report given to [place]. All questions answered. Pt belongings gathered to be sent with him. Waiting on PTAR for transport.

## 2019-11-11 ENCOUNTER — Other Ambulatory Visit: Payer: Self-pay | Admitting: Family Medicine

## 2019-11-15 ENCOUNTER — Inpatient Hospital Stay: Payer: Medicare Other | Admitting: Orthopaedic Surgery

## 2019-11-29 ENCOUNTER — Telehealth: Payer: Self-pay | Admitting: *Deleted

## 2019-11-29 NOTE — Telephone Encounter (Signed)
SLP Tresa Endo with rehab calling for pt, pt present. States received 1st covid vaccine 11/02/2019, is concerned as he is in rehab presently and 21 day time frame has elapsed. Advised pt will not have to repeat first dose. Recommendation is receive same vaccine within 6 weeks. Does not repeat 1st dose.  Verbalizes understanding.

## 2020-01-31 ENCOUNTER — Telehealth: Payer: Self-pay | Admitting: Family Medicine

## 2020-01-31 NOTE — Telephone Encounter (Signed)
Form completed and placed in RN form box in front office.

## 2020-01-31 NOTE — Telephone Encounter (Signed)
NCD[employer] Placard form dropped off for at front desk for completion.  Verified that patient section of form has been completed.  Last DOS/WCC with PCP was 08/05/19.  Placed form in team folder to be completed by clinical staff.  Vilinda Blanks

## 2020-01-31 NOTE — Telephone Encounter (Signed)
Clinical info completed on handicap placard form.  Place form in Dr [employer] box for completion.  Sunday Spillers, CMA

## 2020-02-01 NOTE — Telephone Encounter (Signed)
Step-daughter contacted and informed of form ready for pick up. Copy made for batch scanning.

## 2020-03-14 ENCOUNTER — Telehealth: Payer: Self-pay

## 2020-03-14 NOTE — Telephone Encounter (Signed)
Disability Parking Placard form dropped off for at front desk for completion.  Verified that patient section of form has been completed.  Last DOS/WCC with PCP was 08/05/19.  Placed form in team folder to be completed by clinical staff.  [employer]

## 2020-03-14 NOTE — Telephone Encounter (Signed)
Clinical info completed on Handicap Placard form.  Placed form in Dr. Cresenzo's box for completion.  Kristopher Ruiz T Autry Droege, CMA  

## 2020-04-10 NOTE — Progress Notes (Shared)
[facility] - Clinic Note  04/13/2020     CHIEF COMPLAINT Patient presents for No chief complaint on file.   HISTORY OF PRESENT ILLNESS: Kristopher Debono. is a 70 y.o. male who presents to the clinic today for:   pt feels like his left eye vision is worse today, but he tested better on the eye chart  Referring physician: Derrel Nip, MD [address] 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 ALPHAGAN P 0.1 % SOLN Place 1 drop into the left eye 3 (three) times daily.  Marland Kitchen olopatadine (PATANOL) 0.1 % ophthalmic solution Place 1 drop into both eyes 2 (two) times daily.   . RESTASIS 0.05 % ophthalmic emulsion Place 1 drop into both eyes 2 (two) times daily.    No current facility-administered medications for this visit. (Ophthalmic Drugs)   Current Outpatient Medications (Other)  Medication Sig  . amLODipine (NORVASC) 10 MG tablet Take 10 mg by mouth daily.  Marland Kitchen atorvastatin (LIPITOR) 80 MG tablet Take 80 mg by mouth daily.  Marland Kitchen enoxaparin (LOVENOX) 40 MG/0.4ML injection Inject 0.4 mLs (40 mg total) into the skin daily.  . hydrochlorothiazide (HYDRODIURIL) 25 MG tablet Take 25 mg by mouth every morning.  . lidocaine (LIDODERM) 5 % Place 1 patch onto the skin daily. Remove & Discard patch within 12 hours or as directed by MD (Patient not taking: Reported on 10/19/2019)  . loratadine (CLARITIN) 10 MG tablet Take 10 mg by mouth daily as needed for allergies.  . methocarbamol (ROBAXIN) 500 MG tablet Take 1 tablet (500 mg total) by mouth 4 (four) times daily.  . methocarbamol (ROBAXIN) 500 MG tablet Take 1 tablet (500 mg total) by mouth 4 (four) times daily.  . metoprolol succinate (TOPROL-XL) 25 MG 24 hr tablet Take 1 tablet (25 mg total) by mouth daily.  . nicotine  (NICODERM CQ - DOSED IN MG/24 HR) 7 mg/24hr patch Place 1 patch (7 mg total) onto the skin daily. (Patient not taking: Reported on 10/19/2019)  . ondansetron (ZOFRAN) 4 MG tablet Take 1 tablet (4 mg total) by mouth every 6 (six) hours as needed for nausea.  Marland Kitchen oxyCODONE-acetaminophen (PERCOCET) 5-325 MG tablet Take 1-2 tablets by mouth every 6 (six) hours as needed for severe pain.  . Pseudoeph-Doxylamine-DM-APAP (NYQUIL PO) Take 1 Dose by mouth at bedtime.   No current facility-administered medications for this visit. (Other)      REVIEW OF SYSTEMS:    ALLERGIES Allergies  Allergen Reactions  . Ibuprofen Other (See Comments)    GI Problems    PAST MEDICAL HISTORY Past Medical History:  Diagnosis Date  . Arthritis   . Cataract    NS OU  . Hypertension   . Stroke [facility] [place] [facility])    Past Surgical History:  Procedure Laterality Date  . HIP FRACTURE SURGERY    . LEG AMPUTATION BELOW KNEE     left leg  . TOTAL HIP ARTHROPLASTY Left 10/31/2019  . TOTAL HIP ARTHROPLASTY Left 10/31/2019   Procedure: LEFT TOTAL HIP ARTHROPLASTY, POSTERIOR;  Surgeon: Tarry Kos, MD;  Location: [facility] OR;  Service: Orthopedics;  Laterality: Left;    FAMILY HISTORY Family History  Problem Relation Age of Onset  . Hypertension Mother   . Hypertension Father     SOCIAL HISTORY Social History  Tobacco Use  . Smoking status: Current Every Day Smoker    Types: Cigarettes  . Smokeless tobacco: Never Used  Vaping Use  . Vaping Use: Never used  Substance Use Topics  . Alcohol use: No  . Drug use: No         OPHTHALMIC EXAM:  Not recorded     IMAGING AND PROCEDURES  Imaging and Procedures for @TODAY @           ASSESSMENT/PLAN:    ICD-10-CM   1. 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 Retinal edema  H35.81   4. Essential hypertension  I10   5. Hypertensive retinopathy of both eyes  H35.033   6. Combined forms of age-related cataract of both eyes  H25.813     1,2. Retinal ischemia with focal, peripheral pigmentary retinopathy -- stable  - perivascular pigment clumping temporal and inferior periphery (3 and 6 oclock)  - fluorescein angiography 6.2.21 shows peripheral vascular defect distal to perivascular pigmentation; no leakage or neovascularization  - discussed findings  - no acute intervention indicated or recommended  - f/u 6 months, DFE, OCT  3. No retinal edema on exam or OCT  4,5. Hypertensive retinopathy OU  - discussed importance of tight BP control  - monitor  6. Mixed cataracts OU   - The symptoms of cataract, surgical options, and treatments and risks were discussed with patient.  - discussed diagnosis and progression  - suspect most of his visual symptoms related to cataract  - may benefit from cataract surgery / consult  - management per Dr. 8.2.21      Ophthalmic Meds Ordered this visit:  No orders of the defined types were placed in this encounter.      No follow-ups on file.  There are no Patient Instructions on file for this visit.   Explained the diagnoses, plan, and follow up with the patient and they expressed understanding.  Patient expressed understanding of the importance of proper follow up care.   This document serves as a record of services personally performed by Hanley Seamen, MD, PhD. It was created on their behalf by Karie Chimera. Glee Arvin, OA an ophthalmic technician. The creation of this record is the provider's dictation and/or activities during the visit.    Electronically signed by: Manson Passey. Glee Arvin, Manson Passey 01.11.2022 12:19 PM  03.11.2022, M.D., Ph.D. Diseases & Surgery of the Retina and Vitreous [facility]    Abbreviations: M myopia (nearsighted); A astigmatism; H hyperopia (farsighted); P presbyopia; Mrx spectacle prescription;  CTL contact lenses; OD right eye; OS left eye; OU both eyes  XT exotropia; ET esotropia; PEK punctate epithelial keratitis; PEE  punctate epithelial erosions; DES dry eye syndrome; MGD meibomian gland dysfunction; ATs artificial tears; PFAT's preservative free artificial tears; NSC nuclear sclerotic cataract; PSC posterior subcapsular cataract; ERM epi-retinal membrane; PVD posterior vitreous detachment; RD retinal detachment; DM diabetes mellitus; DR diabetic retinopathy; NPDR non-proliferative diabetic retinopathy; PDR proliferative diabetic retinopathy; CSME clinically significant macular edema; DME diabetic macular edema; dbh dot blot hemorrhages; CWS cotton wool spot; POAG primary open angle glaucoma; C/D cup-to-disc ratio; HVF humphrey visual field; GVF goldmann visual field; OCT optical coherence tomography; IOP intraocular pressure; BRVO Branch retinal vein occlusion; CRVO central retinal vein occlusion; CRAO central retinal artery occlusion; BRAO branch retinal artery occlusion; RT retinal tear; SB scleral buckle; PPV pars plana vitrectomy; VH Vitreous hemorrhage; PRP panretinal laser photocoagulation; IVK intravitreal kenalog; VMT  vitreomacular traction; MH Macular hole;  NVD neovascularization of the disc; NVE neovascularization elsewhere; AREDS age related eye disease study; ARMD age related macular degeneration; POAG primary open angle glaucoma; EBMD epithelial/anterior basement membrane dystrophy; ACIOL anterior chamber intraocular lens; IOL intraocular lens; PCIOL posterior chamber intraocular lens; Phaco/IOL phacoemulsification with intraocular lens placement; [place] photorefractive keratectomy; LASIK laser assisted in situ keratomileusis; HTN hypertension; DM diabetes mellitus; COPD chronic obstructive pulmonary disease

## 2020-04-13 ENCOUNTER — Encounter (INDEPENDENT_AMBULATORY_CARE_PROVIDER_SITE_OTHER): Payer: Medicare Other | Admitting: Ophthalmology

## 2020-04-13 DIAGNOSIS — H35033 Hypertensive retinopathy, bilateral: Secondary | ICD-10-CM

## 2020-04-13 DIAGNOSIS — H3581 Retinal edema: Secondary | ICD-10-CM

## 2020-04-13 DIAGNOSIS — H3582 Retinal ischemia: Secondary | ICD-10-CM

## 2020-04-13 DIAGNOSIS — H25813 Combined forms of age-related cataract, bilateral: Secondary | ICD-10-CM

## 2020-04-13 DIAGNOSIS — I10 Essential (primary) hypertension: Secondary | ICD-10-CM

## 2020-04-13 DIAGNOSIS — H3552 Pigmentary retinal dystrophy: Secondary | ICD-10-CM

## 2020-04-19 ENCOUNTER — Ambulatory Visit (INDEPENDENT_AMBULATORY_CARE_PROVIDER_SITE_OTHER): Payer: Medicare Other | Admitting: Orthopedic Surgery

## 2020-04-19 ENCOUNTER — Encounter: Payer: Self-pay | Admitting: Orthopedic Surgery

## 2020-04-19 DIAGNOSIS — S88112A Complete traumatic amputation at level between knee and ankle, left lower leg, initial encounter: Secondary | ICD-10-CM

## 2020-04-19 DIAGNOSIS — Z89512 Acquired absence of left leg below knee: Secondary | ICD-10-CM

## 2020-04-19 NOTE — Progress Notes (Signed)
Office Visit Note   Patient: Kristopher Ruiz.           Date of Birth: [birth year]           MRN: [id number] Visit Date: 04/19/2020              Requested by: Derrel Nip, MD [address] [place],  [place] [address] PCP: Derrel Nip, MD  Chief Complaint  Patient presents with  . Left Leg - Follow-up    Hx BKA needs rx for prosthetic from [employer]       HPI: Patient is a 70 year old gentleman who is status post a left transtibial amputation.  Patient has a broken foot and ankle which is unstable and hand has caused him to fall.  He states his socket is also unstable with a loose fit.  Assessment & Plan: Visit Diagnoses:  1. 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HCC)     Plan: Patient is given a prescription for [employer] for new socket new liner new material supplies including a foot and ankle patient is a K3 level amputee  Follow-Up Instructions: Return if symptoms worsen or fail to improve.   Ortho Exam  Patient is alert, oriented, no adenopathy, well-dressed, normal affect, normal respiratory effort. Examination patient has an unstable foot and ankle that is unstable side to side.  The patient has decreased volume of the residual limb and has a poor fitting unstable socket.  Patient is an existing left transtibial  amputee.  Patient's current comorbidities are not expected to impact the ability to function with the prescribed prosthesis. Patient verbally communicates a strong desire to use a prosthesis. Patient currently requires mobility aids to ambulate without a prosthesis.  Expects not to use mobility aids with a new prosthesis.  Patient is a K3 level ambulator that spends a lot of time walking around on uneven terrain over obstacles, up and down stairs, and ambulates with a variable cadence.   Imaging: No results found. No images are attached to the encounter.  Labs: Lab Results  Component Value Date   HGBA1C 6.4 (H) 09/14/2018   HGBA1C 6.2  (H) 09/07/2018     Lab Results  Component Value Date   ALBUMIN 3.9 10/31/2019   ALBUMIN 3.6 09/13/2018   PREALBUMIN 21.8 10/31/2019    No results found for: MG No results found for: VD25OH  Lab Results  Component Value Date   PREALBUMIN 21.8 10/31/2019   CBC EXTENDED Latest Ref Rng & Units 11/01/2019 10/31/2019 10/31/2019  WBC 4.0 - 10.5 K/uL 10.6(H) 12.0(H) 4.7  RBC 4.22 - 5.81 MIL/uL 4.08(L) 4.40 4.75  HGB 13.0 - 17.0 g/dL 11.0(L) 11.8(L) 12.7(L)  HCT 39.0 - 52.0 % 35.4(L) 38.3(L) 41.2  PLT 150 - 400 K/uL 228 238 273  NEUTROABS 1.7 - 7.7 K/uL - - 2.1  LYMPHSABS 0.7 - 4.0 K/uL - - 1.9     There is no height or weight on file to calculate BMI.  Orders:  No orders of the defined types were placed in this encounter.  No orders of the defined types were placed in this encounter.    Procedures: No procedures performed  Clinical Data: No additional findings.  ROS:  All other systems negative, except as noted in the HPI. Review of Systems  Objective: Vital Signs: There were no vitals taken for this visit.  Specialty Comments:  No specialty comments available.  PMFS History: Patient Active Problem List   Diagnosis Date Noted  . Status post  total replacement of left hip 10/31/2019  . Closed displaced fracture of left femoral neck with delayed healing 09/28/2019  . Establishing care with new doctor, encounter for 06/01/2019  . TIA (transient ischemic attack) 09/13/2018  . Cerebellar stroke (HCC) 09/06/2018  . Hypoglycemia due to insulin 09/06/2018  . Tobacco abuse 09/06/2018  . Hyperlipidemia 09/06/2018  . Essential hypertension 09/06/2018  . 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HCC) 04/16/2017   Past Medical History:  Diagnosis Date  . Arthritis   . Cataract    NS OU  . Hypertension   . Stroke [facility])     Family History  Problem Relation Age of Onset  . Hypertension Mother   . Hypertension Father     Past Surgical History:  Procedure  Laterality Date  . HIP FRACTURE SURGERY    . LEG AMPUTATION BELOW KNEE     left leg  . TOTAL HIP ARTHROPLASTY Left 10/31/2019  . TOTAL HIP ARTHROPLASTY Left 10/31/2019   Procedure: LEFT TOTAL HIP ARTHROPLASTY, POSTERIOR;  Surgeon: Tarry Kos, MD;  Location: [facility] OR;  Service: Orthopedics;  Laterality: Left;   Social History   Occupational History  . Not on file  Tobacco Use  . Smoking status: Current Every Day Smoker    Types: Cigarettes  . Smokeless tobacco: Never Used  Vaping Use  . Vaping Use: Never used  Substance and Sexual Activity  . Alcohol use: No  . Drug use: No  . Sexual activity: Not on file

## 2020-06-25 ENCOUNTER — Telehealth: Payer: Self-pay | Admitting: Orthopedic Surgery

## 2020-06-25 NOTE — Telephone Encounter (Signed)
Pt step daughter called and state this pt needs a refill on oxycodone

## 2020-06-25 NOTE — Telephone Encounter (Signed)
Can you please call pt and make an appt for eval?  

## 2020-06-25 NOTE — Telephone Encounter (Signed)
Yes ma'am! 

## 2020-06-27 ENCOUNTER — Telehealth: Payer: Self-pay | Admitting: Family Medicine

## 2020-06-27 ENCOUNTER — Ambulatory Visit (INDEPENDENT_AMBULATORY_CARE_PROVIDER_SITE_OTHER): Payer: Medicare Other | Admitting: Physician Assistant

## 2020-06-27 ENCOUNTER — Ambulatory Visit: Payer: Self-pay

## 2020-06-27 ENCOUNTER — Encounter: Payer: Self-pay | Admitting: Physician Assistant

## 2020-06-27 DIAGNOSIS — M25552 Pain in left hip: Secondary | ICD-10-CM | POA: Diagnosis not present

## 2020-06-27 MED ORDER — PREDNISONE 10 MG PO TABS: 10.0000 mg | ORAL_TABLET | Freq: Every day | ORAL | 0 refills | Status: DC

## 2020-06-27 NOTE — Progress Notes (Signed)
Office Visit Note   Patient: Kristopher Ruiz.           Date of Birth: [birth year]           MRN: [id number] Visit Date: 06/27/2020              Requested by: Derrel Nip, MD [address] [place],  [place] [address] PCP: Derrel Nip, MD  Chief Complaint  Patient presents with  . Left Hip - Pain      HPI: Patient is a pleasant 70 year old gentleman who presents today with a chief complaint of left hip pain.  He is status post left below-knee amputation.  He has also had a stroke.  He points to the pain on the outside of the hip and says it does radiate down to his stump.  He denies any weakness but occasionally gets tingling in his leg.  He denies any trauma recently.  He has minimal groin pain.  Assessment & Plan: Visit Diagnoses:  1. Pain in left hip     Plan: Findings more consistent with a lumbar radiculopathy.  I have recommended he try some prednisone with food.  If he has any GI upset he is to discontinue the prednisone.  Follow-up with [place] in 3 weeks.  I will review his x-rays with Dr. Roda Shutters to see if any follow-up is needed  Follow-Up Instructions: No follow-ups on file.   Ortho Exam  Patient is alert, oriented, no adenopathy, well-dressed, normal affect, normal respiratory effort. Examination patient is sitting comfortably in a wheelchair.  Minimal pain with internal and external rotation of his hip.  He does have pain to palpation in the posterior buttocks that reproduces some radicular symptoms down his lateral thigh.  Imaging: XR HIP UNILAT W OR W/O PELVIS 2-3 VIEWS LEFT  Result Date: 06/27/2020 2 views of his pelvis were taken today he is status post IM rodding of a right hip fracture and a hip replacement on the left.  On the left components are seated and in good position.  Very similar x-rays to immediately after surgery.  No dislocation no sign of osteolysis  No images are attached to the encounter.  Labs: Lab Results  Component Value Date   HGBA1C  6.4 (H) 09/14/2018   HGBA1C 6.2 (H) 09/07/2018     Lab Results  Component Value Date   ALBUMIN 3.9 10/31/2019   ALBUMIN 3.6 09/13/2018   PREALBUMIN 21.8 10/31/2019    No results found for: MG No results found for: VD25OH  Lab Results  Component Value Date   PREALBUMIN 21.8 10/31/2019   CBC EXTENDED Latest Ref Rng & Units 11/01/2019 10/31/2019 10/31/2019  WBC 4.0 - 10.5 K/uL 10.6(H) 12.0(H) 4.7  RBC 4.22 - 5.81 MIL/uL 4.08(L) 4.40 4.75  HGB 13.0 - 17.0 g/dL 11.0(L) 11.8(L) 12.7(L)  HCT 39.0 - 52.0 % 35.4(L) 38.3(L) 41.2  PLT 150 - 400 K/uL 228 238 273  NEUTROABS 1.7 - 7.7 K/uL - - 2.1  LYMPHSABS 0.7 - 4.0 K/uL - - 1.9     There is no height or weight on file to calculate BMI.  Orders:  Orders Placed This Encounter  Procedures  . XR HIP UNILAT W OR W/O PELVIS 2-3 VIEWS LEFT   No orders of the defined types were placed in this encounter.    Procedures: No procedures performed  Clinical Data: No additional findings.  ROS:  All other systems negative, except as noted in the HPI. Review of Systems  Objective:  Vital Signs: There were no vitals taken for this visit.  Specialty Comments:  No specialty comments available.  PMFS History: Patient Active Problem List   Diagnosis Date Noted  . 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 Closed displaced fracture of left femoral neck with delayed healing 09/28/2019  . Establishing care with new doctor, encounter for 06/01/2019  . TIA (transient ischemic attack) 09/13/2018  . Cerebellar stroke (HCC) 09/06/2018  . Hypoglycemia due to insulin 09/06/2018  . Tobacco abuse 09/06/2018  . Hyperlipidemia 09/06/2018  . Essential hypertension 09/06/2018  . 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HCC) 04/16/2017   Past Medical History:  Diagnosis Date  . Arthritis   . Cataract    NS OU  . Hypertension   . Stroke [facility])     Family History  Problem Relation Age of Onset  . Hypertension Mother   .  Hypertension Father     Past Surgical History:  Procedure Laterality Date  . HIP FRACTURE SURGERY    . LEG AMPUTATION BELOW KNEE     left leg  . TOTAL HIP ARTHROPLASTY Left 10/31/2019  . TOTAL HIP ARTHROPLASTY Left 10/31/2019   Procedure: LEFT TOTAL HIP ARTHROPLASTY, POSTERIOR;  Surgeon: Tarry Kos, MD;  Location: [facility] OR;  Service: Orthopedics;  Laterality: Left;   Social History   Occupational History  . Not on file  Tobacco Use  . Smoking status: Current Every Day Smoker    Types: Cigarettes  . Smokeless tobacco: Never Used  Vaping Use  . Vaping Use: Never used  Substance and Sexual Activity  . Alcohol use: No  . Drug use: No  . Sexual activity: Not on file

## 2020-06-27 NOTE — Telephone Encounter (Signed)
Personal Care Services form dropped off for at front desk for completion.  Verified that patient section of form has been completed.  Last DOS/WCC with PCP was 08/05/19.  Placed form in team folder to be completed by clinical staff.  Vilinda Blanks

## 2020-06-27 NOTE — Telephone Encounter (Signed)
Clinical info completed on personal care services form.  Place form in Dr. Geanie Logan box for completion. Patient needs to keep his appt on 07-06-20 in order for form to valid.  Naveen Lorusso, CMA

## 2020-06-28 ENCOUNTER — Ambulatory Visit: Payer: Self-pay | Admitting: Licensed Clinical Social Worker

## 2020-06-28 DIAGNOSIS — I639 Cerebral infarction, unspecified: Secondary | ICD-10-CM

## 2020-06-28 DIAGNOSIS — S88112A Complete traumatic amputation at level between knee and ankle, left lower leg, initial encounter: Secondary | ICD-10-CM

## 2020-06-28 NOTE — Telephone Encounter (Signed)
Form completed to the best of my ability.  I have routed the form to social work to help with any further assistance needed.  Will fax when able after the patient's visit.

## 2020-06-28 NOTE — Chronic Care Management (AMB) (Signed)
  Care Management  Consultation Note  06/28/2020 Name: Kristopher Ruiz. MRN: [id number] DOB: 1951-02-22  Kristopher Ruiz. is a 70 y.o. year old male who is a primary care patient of Derrel Nip, MD. The CCM team was consulted reference care coordination needs.  Assessment: Patient is requesting assistance with Personal Care Services.  Intervention: Patient was not interviewed or contacted during this encounter.   CCM LCSW collaborated with PCP .  Conducted brief assessment, recommendations and relevant information discussed.   Follow up Plan: Provider has been informed, If further intervention is needed to assist with this need, please place a formal CCM referral.     Collaboration with Derrel Nip, MD regarding development and update of comprehensive plan of care as evidenced by provider attestation and co-signature Review of patient past medical history, allergies, medications, and health status, including review of pertinent consultant reports was performed as part of comprehensive evaluation and provision of care management/care coordination services.   Care Plan Conditions to be addressed/monitored per PCP order: Level of care concerns for Personal Care Services.  There are no care plans to display for this patient.    Sammuel Hines, LCSW Care Management & Coordination  [facility] / [facility]   [phone] 1:42 PM

## 2020-06-28 NOTE — Addendum Note (Signed)
Addended by: Celedonio Savage on: 06/28/2020 04:23 PM   Modules accepted: Orders

## 2020-06-29 ENCOUNTER — Telehealth: Payer: Self-pay

## 2020-06-29 ENCOUNTER — Telehealth: Payer: Self-pay | Admitting: *Deleted

## 2020-06-29 NOTE — Telephone Encounter (Signed)
Called patient. No answer. LMOM.  Per Roda Shutters he needs an appt to f/u for L THA done 10/2019.

## 2020-06-29 NOTE — Chronic Care Management (AMB) (Signed)
  Care Management   Outreach Note  06/29/2020 Name: Kristopher Ruiz. MRN: [id number] DOB: 1950-06-11  Referred by: Derrel Nip, MD Reason for referral : Care Coordination (Initial outreach to schedule referral with Licensed Clinical SW)   An unsuccessful telephone outreach was attempted today. The patient was referred to the case management team for assistance with care management and care coordination.   Follow Up Plan: A HIPAA compliant phone message was left for the patient providing contact information and requesting a return call. The care management team will reach out to the patient again over the next 7 days. If patient returns call to provider office, please advise to call Embedded Care Management Care Guide Gwenevere Ghazi at [phone].  Gwenevere Ghazi  Care Guide, Embedded Care Coordination [facility] Management

## 2020-07-04 ENCOUNTER — Telehealth: Payer: Self-pay | Admitting: *Deleted

## 2020-07-04 NOTE — Telephone Encounter (Signed)
Pts daughter said that her dad needs all of his BP med refilled, I asked which ones and she said he was taking like 3 and needs them all.  He is completely out and wanted to get some until appointment with PCP on Friday. Pinky Ravan Zimmerman Rumple, CMA

## 2020-07-05 MED ORDER — METOPROLOL SUCCINATE ER 25 MG PO TB24: 25.0000 mg | ORAL_TABLET | Freq: Every day | ORAL | 0 refills | Status: DC

## 2020-07-05 MED ORDER — HYDROCHLOROTHIAZIDE 25 MG PO TABS: 25.0000 mg | ORAL_TABLET | Freq: Every morning | ORAL | 3 refills | Status: DC

## 2020-07-05 MED ORDER — AMLODIPINE BESYLATE 10 MG PO TABS: 10.0000 mg | ORAL_TABLET | Freq: Every day | ORAL | 3 refills | Status: DC

## 2020-07-05 NOTE — Telephone Encounter (Signed)
Refills sent to patient's pharmacy.

## 2020-07-05 NOTE — Addendum Note (Signed)
Addended by: Celedonio Savage on: 07/05/2020 01:32 PM   Modules accepted: Orders

## 2020-07-06 ENCOUNTER — Other Ambulatory Visit: Payer: Self-pay

## 2020-07-06 ENCOUNTER — Ambulatory Visit (INDEPENDENT_AMBULATORY_CARE_PROVIDER_SITE_OTHER): Payer: Medicare Other | Admitting: Family Medicine

## 2020-07-06 ENCOUNTER — Telehealth: Payer: Self-pay

## 2020-07-06 ENCOUNTER — Encounter: Payer: Self-pay | Admitting: Family Medicine

## 2020-07-06 VITALS — BP 130/78 | HR 100 | Ht 63.0 in | Wt 155.6 lb

## 2020-07-06 DIAGNOSIS — R739 Hyperglycemia, unspecified: Secondary | ICD-10-CM | POA: Diagnosis not present

## 2020-07-06 DIAGNOSIS — I10 Essential (primary) hypertension: Secondary | ICD-10-CM | POA: Diagnosis not present

## 2020-07-06 DIAGNOSIS — D649 Anemia, unspecified: Secondary | ICD-10-CM | POA: Insufficient documentation

## 2020-07-06 DIAGNOSIS — I639 Cerebral infarction, unspecified: Secondary | ICD-10-CM

## 2020-07-06 DIAGNOSIS — E785 Hyperlipidemia, unspecified: Secondary | ICD-10-CM

## 2020-07-06 LAB — POCT GLYCOSYLATED HEMOGLOBIN (HGB A1C): HbA1c, POC (controlled diabetic range): 6 % (ref 0.0–7.0)

## 2020-07-06 MED ORDER — METHOCARBAMOL 500 MG PO TABS: 500.0000 mg | ORAL_TABLET | Freq: Three times a day (TID) | ORAL | 3 refills | Status: DC | PRN

## 2020-07-06 MED ORDER — ALPHAGAN P 0.1 % OP SOLN: 1.0000 [drp] | Freq: Three times a day (TID) | OPHTHALMIC | 3 refills | Status: DC

## 2020-07-06 NOTE — Patient Instructions (Signed)
It was great seeing you today.  I have already refilled your blood pressure medications and your blood pressure looks great today.  We are going to check some lab work and I need you to come to a visit with our pharmacis so that we can document all of the medications that you are currently on.  I have given you a packet of paperwork about getting assistance at home.  If you have any questions or concerns please let me know.  I hope you have a wonderful afternoon!

## 2020-07-06 NOTE — Progress Notes (Signed)
    SUBJECTIVE:   CHIEF COMPLAINT / HPI:   HTN  Patient reports that he is unsure of what medications he is supposed to be taking for his blood pressure.  He was prescribed certain medications while he was in the rehab facility but was on different medications when he was not in the rehab facility.  He does not have all of his medications with him but reports that he was on lisinopril at one point and he is not sure who prescribed to them.  I sent in refills for his blood pressure medications that we had on file and the ones that I had started him on a year ago and patient reports that he started taking them yesterday.  Denies any signs or symptoms of hypertension.  Is a very poor historian when it comes to medications.   OBJECTIVE:   BP 130/78   Pulse 100   Ht 5\' 3"  (1.6 m)   Wt 155 lb 9.6 oz (70.6 kg)   SpO2 96%   BMI 27.56 kg/m   General: On chronically ill-appearing 70 year old male in no acute distress, sitting in wheelchair Cardiac: Regular rate and rhythm, no murmurs appreciated Respiratory: Normal work of breathing, lungs clear to auscultation bilaterally Abdomen: Soft, nontender, positive bowel sounds MSK: Patient ambulates with wheelchair  ASSESSMENT/PLAN:   Essential hypertension Patient's blood pressure today 130/78.  Recently restarted taking Norvasc 10 mg, metoprolol XR 25 mg, hydrochlorothiazide 25 mg. -Continue these medications -Checking BMP at this time -Patient is going to follow-up with our pharmacist and bring all of his medications so that we can get an accurate med reconciliation completed and make changes as needed  Hyperlipidemia Collecting lipid panel today  Anemia Unsure etiology from previous CBCs.  We will recheck CBC today as well as a ferritin.     70, MD [facility]

## 2020-07-06 NOTE — Assessment & Plan Note (Signed)
Unsure etiology from previous CBCs.  We will recheck CBC today as well as a ferritin.

## 2020-07-06 NOTE — Assessment & Plan Note (Signed)
Patient's blood pressure today 130/78.  Recently restarted taking Norvasc 10 mg, metoprolol XR 25 mg, hydrochlorothiazide 25 mg. -Continue these medications -Checking BMP at this time -Patient is going to follow-up with our pharmacist and bring all of his medications so that we can get an accurate med reconciliation completed and make changes as needed

## 2020-07-06 NOTE — Chronic Care Management (AMB) (Signed)
  Care Management   Note  07/06/2020 Name: Myquan Schaumburg. MRN: [id number] DOB: 1950-11-03  Malyk Girouard. is a 70 y.o. year old male who is a primary care patient of Derrel Nip, MD. I reached out to [name]. by phone today in response to a referral sent by Mr. Yadiel Aubry Jr.'s PCP, Dr Nobie Putnam.    Mr. Bethel was given information about care management services today including:  1. Care management services include personalized support from designated clinical staff supervised by his physician, including individualized plan of care and coordination with other care providers 2. 24/7 contact phone numbers for assistance for urgent and routine care needs. 3. The patient may stop care management services at any time by phone call to the office staff.  Patient agreed to services and verbal consent obtained.   Follow up plan: Telephone appointment with care management team member scheduled for:07/18/2020  [facility] Guide, Embedded Care Coordination [facility] Management

## 2020-07-06 NOTE — Assessment & Plan Note (Signed)
Collecting lipid panel today

## 2020-07-07 LAB — LIPID PANEL
Chol/HDL Ratio: 3.8 ratio (ref 0.0–5.0)
Cholesterol, Total: 223 mg/dL — ABNORMAL HIGH (ref 100–199)
HDL: 58 mg/dL (ref >39)
LDL Chol Calc (NIH): 136 mg/dL — ABNORMAL HIGH (ref 0–99)
Triglycerides: 161 mg/dL — ABNORMAL HIGH (ref 0–149)
VLDL Cholesterol Cal: 29 mg/dL (ref 5–40)

## 2020-07-07 LAB — BASIC METABOLIC PANEL
BUN/Creatinine Ratio: 11 (ref 10–24)
BUN: 9 mg/dL (ref 8–27)
CO2: 22 mmol/L (ref 20–29)
Calcium: 10 mg/dL (ref 8.6–10.2)
Chloride: 103 mmol/L (ref 96–106)
Creatinine, Ser: 0.81 mg/dL (ref 0.76–1.27)
Glucose: 90 mg/dL (ref 65–99)
Potassium: 3.8 mmol/L (ref 3.5–5.2)
Sodium: 146 mmol/L — ABNORMAL HIGH (ref 134–144)
eGFR: 95 mL/min/{1.73_m2} (ref >59)

## 2020-07-07 LAB — CBC
Hematocrit: 37.8 % (ref 37.5–51.0)
Hemoglobin: 12.2 g/dL — ABNORMAL LOW (ref 13.0–17.7)
MCH: 26.6 pg (ref 26.6–33.0)
MCHC: 32.3 g/dL (ref 31.5–35.7)
MCV: 83 fL (ref 79–97)
Platelets: 280 10*3/uL (ref 150–450)
RBC: 4.58 x10E6/uL (ref 4.14–5.80)
RDW: 13.6 % (ref 11.6–15.4)
WBC: 7.2 10*3/uL (ref 3.4–10.8)

## 2020-07-07 LAB — FERRITIN: Ferritin: 129 ng/mL (ref 30–400)

## 2020-07-10 ENCOUNTER — Ambulatory Visit: Payer: Medicare Other | Admitting: Rehabilitation

## 2020-07-16 ENCOUNTER — Other Ambulatory Visit: Payer: Self-pay

## 2020-07-16 ENCOUNTER — Ambulatory Visit (INDEPENDENT_AMBULATORY_CARE_PROVIDER_SITE_OTHER): Payer: Medicare Other | Admitting: Pharmacist

## 2020-07-16 VITALS — BP 120/80

## 2020-07-16 DIAGNOSIS — I10 Essential (primary) hypertension: Secondary | ICD-10-CM

## 2020-07-16 NOTE — Progress Notes (Signed)
Subjective:    Patient ID: [name]., male    DOB: 10-27-1950, 70 y.o.   MRN: [id number]  HPI Patient is a 70 y.o. male who presents for medication review and management.  He is in good spirits and presents with assistance from wheelchair with daughter present. Patient was referred and last seen by Primary Care Provider 07/06/20.  Patient was discharged from rehab facility about a month ago leading to some changes with his medications. Patient supplied medications and upon review the following issues were noted: -Patient taking lisinopril 20mg  but was not on Epic medication list -Patient was taking aspirin 325mg  (OTC) since he ran out of aspirin 81mg  prescription -Patient did not have atorvastatin 80mg  and was not sure about this medication  Patient's daughter states he uses a pill and daughter writes on prescription time of day to take it so patient knows how to accurately fill his .  Medication Adherence Questionnaire (A score of 2 or more points indicates risk for nonadherence)  Do you know what each of your medicines is for? 1 (1 point if no)  Do you ever have trouble remembering to take your medicine? 0 (2 points if yes)  Do you ever not take a medicine because you feel you do not need it?  0 (1 point if yes)  Do you think that any of your medicines is not helping you? 0 (1 point if yes)  Do you have any physical problems such as vision loss that keep you from taking your medicines as prescribed?  0 (2 points if yes)  Do you think any of your medicine is causing a side effect? 1; wakes up with dizziness and feels his balance is off (1 point if yes)  Do you know the names of ALL of your medicines? 1 (1 point if no)  Do you think that you need ALL of your medicines? 0 (1 point if no)  In the past 6 months, have you missed getting a refill or a new prescription filled on time? 0 (1 point if yes)  How often do you miss taking a dose of medicine? 0 Never (0 points),  1 or 2 times a month (1 points), 1 time a week (2 points), 2 or more times a week (3 points).   TOTAL SCORE 3/14    Objective:   Labs:   Vitals:   07/16/20 1359  BP: 120/80   PHQ-9: 8  Assessment/Plan:   Understanding of regimen: fair  Understanding of indications: poor  Potential of compliance: good  Patient has known adherence challenges based on score of 3 for questionnaire. Barriers include: lack of knowledge and side effect complaints. Medication list reviewed and updated. Patient was provided with a printed medication list.   Hypertension controlled based on in-office reading today. Despite discussing with patient that his blood pressure was normal, he still does not believe he needs all of his blood pressure medications. Discussed with patient that we can utilize a 24 hour ambulatory blood pressure monitor to determine if he is becoming hypotensive, as he does not check his blood pressure at home. Patient is agreeable. Will return to clinic in one week to place ambulatory blood pressure monitor on with follow-up the next day.  Follow-up appointment in one week for ambulatory blood pressure monitor placement. Will send message to PCP regarding elevated PHQ-9 score for follow-up. Written patient instructions provided.  This appointment required 45 minutes of patient care (this includes precharting, chart review, review  of results, and face-to-face care).  Thank you for involving pharmacy to assist in providing this patient's care.

## 2020-07-16 NOTE — Patient Instructions (Signed)
Kristopher Ruiz it was a pleasure seeing you today.   Today we reviewed all of the medications you are currently taking. Included is an updated medication list. Please continue taking all medications as prescribed on this list.  You will come in next week to have the blood pressure monitor placed on your arm for 24 hours.  If you have any questions please call the clinic and ask to speak with me.  Follow-up with me in one week.

## 2020-07-17 ENCOUNTER — Other Ambulatory Visit: Payer: Self-pay | Admitting: Family Medicine

## 2020-07-17 MED ORDER — ATORVASTATIN CALCIUM 80 MG PO TABS: 80.0000 mg | ORAL_TABLET | Freq: Every day | ORAL | 0 refills | Status: DC

## 2020-07-17 MED ORDER — ASPIRIN 325 MG PO TABS: 325.0000 mg | ORAL_TABLET | Freq: Every day | ORAL | 0 refills | Status: DC

## 2020-07-17 NOTE — Progress Notes (Signed)
Refilled ASA and Lipitor.   Katha Cabal, DO PGY-2,  Family Medicine 07/17/2020 11:15 AM

## 2020-07-18 ENCOUNTER — Telehealth: Payer: Self-pay | Admitting: Family Medicine

## 2020-07-18 ENCOUNTER — Ambulatory Visit: Payer: Medicare Other | Admitting: Licensed Clinical Social Worker

## 2020-07-18 DIAGNOSIS — Z139 Encounter for screening, unspecified: Secondary | ICD-10-CM

## 2020-07-18 NOTE — Telephone Encounter (Signed)
   Kristopher Ruiz. DOB: 04-29-1950 MRN: [id number]   RIDER WAIVER AND RELEASE OF LIABILITY  For purposes of improving physical access to our facilities, [facility] is pleased to partner with third parties to provide [facility] patients or other authorized individuals the option of convenient, on-demand ground transportation services (the [employer]") through use of the technology service that enables users to request on-demand ground transportation from independent third-party providers.  By opting to use and accept these [employer], I, the undersigned, hereby agree on behalf of myself, and on behalf of any minor child using the [employer] for whom I am the parent or legal guardian, as follows:  1. Science writer provided to me are provided by independent third-party transportation providers who are not [employer] or employees and who are unaffiliated with [employer]. 2. [place] is neither a transportation carrier nor a common or public carrier. 3. [facility] has no control over the quality or safety of the transportation that occurs as a result of the [employer]. 4. [facility] cannot guarantee that any third-party transportation provider will complete any arranged transportation service. 5. [facility] makes no representation, warranty, or guarantee regarding the reliability, timeliness, quality, safety, suitability, or availability of any of the Transport Services or that they will be error free. 6. I fully understand that traveling by vehicle involves risks and dangers of serious bodily injury, including permanent disability, paralysis, and death. I agree, on behalf of myself and on behalf of any minor child using the Transport Services for whom I am the parent or legal guardian, that the entire risk arising out of my use of the [employer] remains solely with me, to the maximum extent permitted under applicable law. 7. The [employer] are provided "as is" and "as available." [facility] disclaims all representations and warranties, express, implied or statutory, not expressly set out in these terms, including the implied warranties of merchantability and fitness for a particular purpose. 8. I hereby waive and release [place], its agents, employees, officers, directors, representatives, insurers, attorneys, assigns, successors, subsidiaries, and affiliates from any and all past, present, or future claims, demands, liabilities, actions, causes of action, or suits of any kind directly or indirectly arising from acceptance and use of the [employer]. 9. I further waive and release [facility] and its affiliates from all present and future liability and responsibility for any injury or death to persons or damages to property caused by or related to the use of the [employer]. 10. I have read this Waiver and Release of Liability, and I understand the terms used in it and their legal significance. This Waiver is freely and voluntarily given with the understanding that my right (as well as the right of any minor child for whom I am the parent or legal guardian using the [employer]) to legal recourse against [place] in connection with the [employer] is knowingly surrendered in return for use of these services.   I attest that I read the consent document to Kristopher Ruiz., gave Kristopher Ruiz the opportunity to ask questions and answered the questions asked (if any). I affirm that Kristopher Ruiz. then provided consent for he's participation in this program.     Kristopher Ruiz

## 2020-07-18 NOTE — Patient Instructions (Signed)
Licensed Clinical Social Worker Visit Information  Goals we discussed today:  Goals Addressed            This Visit's Progress   . Client will have ADL needs addressed by [facility]       Patient Goals/Self-Care Activities: Over the next 5 days . Call [facility] to schedule assessment [phone] or [phone] . They have received all needed information     . Effective Long-Term Care Planning       Patient Goals/Self-Care Activities :  . Complete Advance Directive packet,  . Call LCSW if you have questions  . Have advance directive notarized and provide a copy to provider office      . Find Help in My Community       Timeframe:  Short-Term Goal Priority:  High Start Date:   07/18/20                          Expected End Date:                       Follow Up Date 08/03/20   . Call Department of Social Services [phone] to apply for [facility] Transportation  . Call your insurance provider to discuss transportation options . Call your insurance provider for more information about your Enhanced Benefits    Why is this important?    Knowing how and where to find help for yourself or family in your neighborhood and community is an important skill.      Materials provided: Yes: Mr. Duecker and daughter was given information about Care Management services today including:  1. Care Management services include personalized support from designated clinical staff supervised by his physician, including individualized plan of care and coordination with other care providers 2. 24/7 contact phone numbers for assistance for urgent and routine care needs. 3. The patient may stop Care Management services at any time by phone call to the office staff. Daughter verbally agreed to assistance and services provided by embedded care coordination/care management team today.  Patient's daughter verbalizes understanding of instructions provided today and agrees to view in MyChart.   Follow up  plan: Appointment scheduled for SW follow up with client by phone on: 08/03/20  Sammuel Hines, LCSW Care Management & Coordination  [phone]

## 2020-07-18 NOTE — Chronic Care Management (AMB) (Signed)
Care Management Clinical Social Work Note  07/18/2020 Name: Kristopher Ruiz. MRN: [id number] DOB: 1950-11-15  Kristopher Ruiz. is a 70 y.o. year old male who is a primary care patient of Gifford Shave, MD.  The Care Management team was consulted for assistance with chronic disease management and coordination needs.  Engaged with patient by telephone for initial visit in response to provider referral for social work chronic care management and care coordination services  Consent to Services:  Mr. Welcher was given information about Care Management services today including:  1. Care Management services includes personalized support from designated clinical staff supervised by his physician, including individualized plan of care and coordination with other care providers 2. 24/7 contact phone numbers for assistance for urgent and routine care needs. 3. The patient may stop case management services at any time by phone call to the office staff.  Patient's daughter agreed to services and consent obtained.   Assessment: Patient's Daughter Janett Billow provided all information during this encounter. Patient is currently experiencing difficulty with meeting ADL's  and  transporation to appointments. Also concerns with pain and inquired about referral to pain management clinic.  LCSW discussed with [facility] referral coordinator who will follow up.  See Care Plan below for interventions and patient self-care actives.  Recent life changes /stressors: finding new normal after stroke  Recommendation: Patient may benefit from, and is in agreement to follow up with insurance provider to explore enhanced benefits.   Follow up Plan: Patient's daughter would like continued follow-up.  CCM LCSW will reach out to patient's daughter in 2 weeks. Patient will call office if needed prior to next encounter.   Review of patient past medical history, allergies, medications, and health status, including review of relevant  consultants reports was performed today as part of a comprehensive evaluation and provision of chronic care management and care coordination services.  SDOH (Social Determinants of Health) assessments and interventions performed:  SDOH Interventions   Flowsheet Row Most Recent Value  SDOH Interventions   Transportation Interventions Other (Comment), [facility], SCAT ([place])  Dorothy Spark transporation with Medicaid and Medicare]       Advanced Directives Status: See Care Plan for related entries.  Care Plan  Allergies  Allergen Reactions  . Ibuprofen Other (See Comments)    GI Problems    Outpatient Encounter Medications as of 07/18/2020  Medication Sig  . acetaminophen (TYLENOL) 500 MG tablet Take 500 mg by mouth at bedtime.  Marland Kitchen amLODipine (NORVASC) 10 MG tablet Take 1 tablet (10 mg total) by mouth daily.  Marland Kitchen aspirin 325 MG tablet Take 1 tablet (325 mg total) by mouth daily.  Marland Kitchen atorvastatin (LIPITOR) 80 MG tablet Take 1 tablet (80 mg total) by mouth daily.  . brimonidine (ALPHAGAN) 0.2 % ophthalmic solution   . hydrochlorothiazide (HYDRODIURIL) 25 MG tablet Take 1 tablet (25 mg total) by mouth every morning.  Marland Kitchen lisinopril (ZESTRIL) 20 MG tablet Take 1 tablet by mouth daily.  Marland Kitchen loratadine (CLARITIN) 10 MG tablet Take 10 mg by mouth daily as needed for allergies. (Patient not taking: Reported on 07/16/2020)  . methocarbamol (ROBAXIN) 500 MG tablet Take 1 tablet (500 mg total) by mouth every 8 (eight) hours as needed for muscle spasms.  . metoprolol succinate (TOPROL-XL) 25 MG 24 hr tablet Take 1 tablet (25 mg total) by mouth daily.  Marland Kitchen olopatadine (PATANOL) 0.1 % ophthalmic solution Place 1 drop into both eyes 2 (two) times daily.   Marland Kitchen oxyCODONE-acetaminophen (  PERCOCET) 5-325 MG tablet Take 1-2 tablets by mouth every 6 (six) hours as needed for severe pain. (Patient not taking: Reported on 07/16/2020)  . predniSONE (DELTASONE) 10 MG tablet  Take 1 tablet (10 mg total) by mouth daily with breakfast.  . RESTASIS 0.05 % ophthalmic emulsion Place 1 drop into both eyes 2 (two) times daily.    No facility-administered encounter medications on file as of 07/18/2020.    Patient Active Problem List   Diagnosis Date Noted  . Anemia 07/06/2020  . 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 Closed displaced fracture of left femoral neck with delayed healing 09/28/2019  . Establishing care with new doctor, encounter for 06/01/2019  . TIA (transient ischemic attack) 09/13/2018  . Cerebellar stroke ([place]) 09/06/2018  . Hypoglycemia due to insulin 09/06/2018  . Tobacco abuse 09/06/2018  . Hyperlipidemia 09/06/2018  . Essential hypertension 09/06/2018  . 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[place]) 04/16/2017    Conditions to be addressed/monitored: Transportation, Level of care concerns and Inability to perform ADL's independently  Care Plan : General Social Work (Adult)  Updates made by Maurine Cane, LCSW since 07/18/2020 12:00 AM  Problem: No Advance Directive   Goal: Patient will have advance directive   Start Date: 07/18/2020  This Visit's Progress: On track  Priority: High  Current barriers:   . Patient does not have an advance directive.  . Needs education, support and coordination in order to meet this need. Clinical Goal(s): Over the next 60 days, the patient and daughter will review and complete advance directive packet and have notarized.  Interventions: . Collaboration with PCP regarding development and update of comprehensive plan of care as evidenced by provider attestation and co-signature. Bertram Savin care team collaboration (see longitudinal plan of care) . Assessed understanding of Advance Directives. . A voluntary discussion about advanced care planning including importance of advanced directives, was discussed with the patient's daughter.  Elease Etienne educational information on  Advance Directives as well as hard copy of advance directive packet left at front desk Patient Goals/Self-Care Activities : Over the next 30 days . Review mailed education on Advance Directive  . Complete Advance Directive packet,  . Call LCSW if you have questions  . Have advance directive notarized and provide a copy to provider office   Problem: Mobility and Independence with ADL   Goal: Mobility and Independence Optimized with Personal Care Services   Start Date: 07/18/2020  This Visit's Progress: On track  Priority: High  Current Barriers:    Patient unable to consistently perform activities of daily living and needs assistance and support in order to meet this unmet need  Currently unable to independently self manage needs related to chronic health conditions.   Knowledge Deficits related to short term plan for care coordination needs and long term plans for chronic disease management needs Clinical Goals: Over the next 45 to 60 days, patient will have personal care needs met as evident by having PCS Aide in the home assisting with needs.  Clinical Interventions : . Assessed needs, level of care concerns, basic eligibility and provided education on Personal Care Service process,  . Collaborate with primary care provider ref completing PCS referral and [facility] referral coordinator  . Patient and daughter have selected [facility] as their provider . LCSW called [employer] at [phone] confirmed referral received; they have not been able to contact patient or daughter . Shared information with daughter and  advised her  to call [facility] to schedule the assessment; review PCS process with daughter . Other interventions provided: Active listening / Reflection utilized  and Caregiver stress acknowledged  Patient Goals/Self-Care Activities: Over the next 5 days . Call [facility] to schedule assessment [phone] or [phone] . They have received all needed  information    Problem: Barriers to Treatment due to Transporation     Goal: Transporation needs met for medical and non-medical needs   Start Date: 07/18/2020  This Visit's Progress: On track  Priority: High  Current barriers:   . Patient in need of assistance with connecting to community resources for Transportation . Acknowledges deficits with meeting this unmet need . Patient is unable to independently navigate community resource options without care coordination support Clinical Goals: Patient and daughter will work with agencies discussed  to address needs related to transportation  Clinical Interventions:  . Collaboration with Gifford Shave, MD regarding development and update of comprehensive plan of care as evidenced by provider attestation and co-signature . Inter-disciplinary care team collaboration (see longitudinal plan of care) . Assessment of needs, barriers , agencies contacted, as well as how impacting  . Review various resources, discussed options and provided patient information about Department of Social Services (food stamps, [employer], and utilities assistance), [employer] , Transportation provided by [employer] provider, and Enhanced Benefits connected with insurance provider . Referred patient to community resources care guide team for assistance with [employer].  . Discussed plans with patient for ongoing care management follow up and provided patient with direct contact information for care management team . Assisted patient/caregiver with obtaining information about health plan benefits . Other interventions provided:Solution-Focused Strategies and Active listening / Reflection utilized  Patient Goals/Self-Care Activities: Over the next 30 days . Call [place] [phone] to apply for [facility] Transportation  . Call your insurance provider to discuss transportation options . Call your insurance provider for more information  about your Enhanced Benefits  . I have left a SCAT application at the front desk for you      Casimer Lanius, [place] / [place]   [phone] 2:10 PM

## 2020-07-18 NOTE — Telephone Encounter (Signed)
   Telephone encounter was:  Successful.  07/18/2020 Name: Kristopher Ruiz. MRN: [id number] DOB: 14-May-1950  Kristopher Ruiz. is a 70 y.o. year old male who is a primary care patient of Derrel Nip, MD . The community resource team was consulted for assistance with Transportation Needs   Care guide performed the following interventions: Patient provided with information about care guide support team and interviewed to confirm resource needs Discussed resources to assist with transportation options. I emailed Kristopher Ruiz, his step daughter information on using [employer] and also Medicaid transportation services in the future if they ever have a need. But I did set up transportation for the upcomming appt on the 22nd. Placed referral to [facility] via email form.  Follow Up Plan:  No further follow up planned at this time. The patient has been provided with needed resources.  Rojelio Brenner Care Guide, Embedded Care Coordination [facility], Care Management Phone: [phone] Email: [email or web address]

## 2020-07-20 ENCOUNTER — Encounter: Payer: Self-pay | Admitting: Physician Assistant

## 2020-07-20 ENCOUNTER — Ambulatory Visit (INDEPENDENT_AMBULATORY_CARE_PROVIDER_SITE_OTHER): Payer: Medicare Other | Admitting: Physician Assistant

## 2020-07-20 DIAGNOSIS — M25552 Pain in left hip: Secondary | ICD-10-CM

## 2020-07-20 NOTE — Progress Notes (Signed)
Office Visit Note   Patient: Kristopher Ruiz.           Date of Birth: April 22, 1950           MRN: [id number] Visit Date: 07/20/2020              Requested by: Derrel Nip, MD [address] [place],  [place] [address] PCP: Derrel Nip, MD  No chief complaint on file.     HPI: Patient presents today for follow-up on his left hip pain.  This was found to be radicular in nature at his last appointment.  He was placed on a course of prednisone.  He does feel like this is helped him he says sometimes his leg still feels warm but improved  Assessment & Plan: Visit Diagnoses: No diagnosis found.  Plan: He will wean off prednisone follow-up as needed  Follow-Up Instructions: No follow-ups on file.   Ortho Exam  Patient is alert, oriented, no adenopathy, well-dressed, normal affect, normal respiratory effort. Left lower extremity is status post below-knee amputation no warmth no swelling noted in his leg today no pain with internal or external rotation of his hip he does trace his pain from down his buttock to the lateral side of his leg with some associated tingling but again much improved  Imaging: No results found. No images are attached to the encounter.  Labs: Lab Results  Component Value Date   HGBA1C 6.0 07/06/2020   HGBA1C 6.4 (H) 09/14/2018   HGBA1C 6.2 (H) 09/07/2018     Lab Results  Component Value Date   ALBUMIN 3.9 10/31/2019   ALBUMIN 3.6 09/13/2018   PREALBUMIN 21.8 10/31/2019    No results found for: MG No results found for: VD25OH  Lab Results  Component Value Date   PREALBUMIN 21.8 10/31/2019   CBC EXTENDED Latest Ref Rng & Units 07/06/2020 11/01/2019 10/31/2019  WBC 3.4 - 10.8 x10E3/uL 7.2 10.6(H) 12.0(H)  RBC 4.14 - 5.80 x10E6/uL 4.58 4.08(L) 4.40  HGB 13.0 - 17.7 g/dL 12.2(L) 11.0(L) 11.8(L)  HCT 37.5 - 51.0 % 37.8 35.4(L) 38.3(L)  PLT 150 - 450 x10E3/uL 280 228 238  NEUTROABS 1.7 - 7.7 K/uL - - -  LYMPHSABS 0.7 - 4.0 K/uL - - -      There is no height or weight on file to calculate BMI.  Orders:  No orders of the defined types were placed in this encounter.  No orders of the defined types were placed in this encounter.    Procedures: No procedures performed  Clinical Data: No additional findings.  ROS:  All other systems negative, except as noted in the HPI. Review of Systems  Objective: Vital Signs: There were no vitals taken for this visit.  Specialty Comments:  No specialty comments available.  PMFS History: Patient Active Problem List   Diagnosis Date Noted  . Anemia 07/06/2020  . 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 Closed displaced fracture of left femoral neck with delayed healing 09/28/2019  . Establishing care with new doctor, encounter for 06/01/2019  . TIA (transient ischemic attack) 09/13/2018  . Cerebellar stroke ([facility]) 09/06/2018  . Hypoglycemia due to insulin 09/06/2018  . Tobacco abuse 09/06/2018  . Hyperlipidemia 09/06/2018  . Essential hypertension 09/06/2018  . 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[facility]) 04/16/2017   Past Medical History:  Diagnosis Date  . Arthritis   . Cataract    NS OU  . Hypertension   . Stroke [facility])  Family History  Problem Relation Age of Onset  . Hypertension Mother   . Hypertension Father     Past Surgical History:  Procedure Laterality Date  . HIP FRACTURE SURGERY    . LEG AMPUTATION BELOW KNEE     left leg  . TOTAL HIP ARTHROPLASTY Left 10/31/2019  . TOTAL HIP ARTHROPLASTY Left 10/31/2019   Procedure: LEFT TOTAL HIP ARTHROPLASTY, POSTERIOR;  Surgeon: Tarry Kos, MD;  Location: [facility] OR;  Service: Orthopedics;  Laterality: Left;   Social History   Occupational History  . Not on file  Tobacco Use  . Smoking status: Former Smoker    Types: Cigarettes  . Smokeless tobacco: Never Used  Vaping Use  . Vaping Use: Never used  Substance and Sexual Activity  . Alcohol use: No  . Drug use: No  . Sexual  activity: Not on file

## 2020-07-23 ENCOUNTER — Ambulatory Visit (INDEPENDENT_AMBULATORY_CARE_PROVIDER_SITE_OTHER): Payer: Medicare Other | Admitting: Pharmacist

## 2020-07-23 ENCOUNTER — Other Ambulatory Visit: Payer: Self-pay

## 2020-07-23 VITALS — BP 106/72

## 2020-07-23 DIAGNOSIS — I10 Essential (primary) hypertension: Secondary | ICD-10-CM

## 2020-07-23 NOTE — Patient Instructions (Addendum)
Wearing the Blood Pressure Monitor  The cuff will inflate every 20 minutes during the day and every 30 minutes while you sleep.  Your blood pressure readings will NOT display after cuff inflation  Fill out the blood pressure-activity diary during the day, especially during activities that may affect your reading -- such as exercise, stress, walking, taking your blood pressure medications   Blood Pressure Activity Diary Time Lying down/ Sleeping Walking/ Exercise Stressed/ Angry Headache/ Pain Dizzy  9 AM       10 AM       11 AM       12 PM       1 PM       2 PM       Time Lying down/ Sleeping Walking/ Exercise Stressed/ Angry Headache/ Pain Dizzy  3 PM       4 PM        5 PM       6 PM       7 PM       8 PM       Time Lying down/ Sleeping Walking/ Exercise Stressed/ Angry Headache/ Pain Dizzy  9 PM       10 PM       11 PM       12 AM       1 AM       2 AM       3 AM       Time Lying down/ Sleeping Walking/ Exercise Stressed/ Angry Headache/ Pain Dizzy  4 AM       5 AM       6 AM       7 AM       8 AM       9 AM       10 AM         Important things to know:  Avoid taking the monitor off for the next 24 hours, unless it causes you discomfort or pain.  Do NOT get the monitor wet and do NOT dry to clean the monitor with any cleaning products.  Do NOT put the monitor on anyone else's arm.  When the cuff inflates, avoid excess movement. Let the cuffed arm hang loosely, slightly away from the body. Avoid flexing the muscles or moving the hand/fingers.  When you go to sleep, make sure that the hose is not kinked.  Remember to fill out the blood pressure activity diary.  If you experience severe pain or unusual pain (not associated with getting your blood pressure checked), remove the monitor.  Troubleshooting:  Code  Troubleshooting    1  Check cuff position, tighten cuff   2, 3  Remain still during reading   4, 87  Check air hose connections and make sure cuff is tight   85, 89  Check hose connections and make tubing is not crimped   86  Push START/STOP to restart reading   88, 91  Retry by pushing START/STOP   90  Replace batteries. If problem persists, remove monitor and bring back to   clinic at follow up   97, 98, 99  Service required - Remove monitor and bring back to clinic at follow up   Time you woke up: _________                  Time you went to sleep:__________    Come back tomorrow morning to have  the monitor removed  Call the [facility] if you have any questions before then [phone])

## 2020-07-23 NOTE — Progress Notes (Signed)
   S:    Patient arrives in good spirits with assistance ambulating via wheelchair. Presents to the clinic for ambulatory blood pressure evaluation.   Patient was referred and last seen by Primary Care Provider on 07/06/20.   Cannot recall when he was diagnosed with hypertension but states it has been 10+ years.    Medication compliance is reported to be optimal.   Discussed procedure for wearing the monitor and gave patient written instructions. Monitor was placed on non-dominant arm with instructions to return in the morning.   Current BP Medications include:  Amlodipine 10mg , HCTZ 25mg , lisinopril 20mg , metoprolol succinate 25mg    Antihypertensives tried in the past include: diltiazem 120mg    O:   Last 3 Office BP readings: BP Readings from Last 3 Encounters:  07/23/20 106/72  07/16/20 120/80  07/06/20 130/78     Clinical Atherosclerotic Cardiovascular Disease (ASCVD): Yes  The ASCVD Risk score DC Jr., et al., 2013) failed to calculate for the following reasons:   The patient has a prior MI or stroke diagnosis  Basic Metabolic Panel    Component Value Date/Time   NA 146 (H) 07/06/2020 1012   K 3.8 07/06/2020 1012   CL 103 07/06/2020 1012   CO2 22 07/06/2020 1012   GLUCOSE 90 07/06/2020 1012   GLUCOSE 139 (H) 11/01/2019 0216   BUN 9 07/06/2020 1012   CREATININE 0.81 07/06/2020 1012   CALCIUM 10.0 07/06/2020 1012   GFRNONAA >60 11/01/2019 0216   GFRAA >60 11/01/2019 0216    A/P: History of hypertension longstanding with current complaints of dizziness. Medication adherence appears optimal, although patient has hard time recalling which medications he is currently taking and their indications. Placed 24-hour ambulatory blood pressure on patient. Patient to return tomorrow to discuss results.   Written information provided.  Total time in face-to-face counseling 30 minutes.   Patient seen with 09/05/2020, PharmD Candidate

## 2020-07-24 ENCOUNTER — Encounter: Payer: Self-pay | Admitting: Pharmacist

## 2020-07-24 ENCOUNTER — Ambulatory Visit (INDEPENDENT_AMBULATORY_CARE_PROVIDER_SITE_OTHER): Payer: Medicare Other | Admitting: Pharmacist

## 2020-07-24 DIAGNOSIS — I10 Essential (primary) hypertension: Secondary | ICD-10-CM | POA: Diagnosis not present

## 2020-07-24 NOTE — Patient Instructions (Addendum)
Thank you for coming in to see Kristopher Ruiz today. Today we reviewed the results from your 24 hour blood pressure monitoring trial and found good results. Today we would like you to stop taking the fluid pill hydrochlorothiazide 25 MG. Please continue to monitor for any signs of low blood pressure such as light headedness or dizziness. We will follow up with a blood pressure check soon with your daughter present. Please schedule a follow up with Dr. Nobie Putnam in 3-4 weeks. Please bring your pill box and pill bottles to your next visit.

## 2020-07-24 NOTE — Progress Notes (Signed)
S:    Patient arrives in pleasant condition in his wheel chair.Patient was referred and last seen by Primary Care Provider on 07/06/2020. Presents to the clinic for ambulatory blood pressure evaluation. Patient arrives wearing 24hr blood pressure cuff, but does acknowledge it was difficult to wear and some of the tubing was displaced at some point during the day. Patient reports headaches and dizziness associated with standing up. During encounter, assisted patient with standing up to evaluate dizziness. Patient unsteady on feet upon standing and endorsed dizziness. Patient did not bring pill bottles or daughter to this appointment.    Cannot recall when he was diagnosed with hypertension but states it has been 10+ years.    Medication compliance is reported to be good. Patient reports his daughter fills his pill box regularly and assist in medication management. Patient himself unable to recall names and indication of medications without his pill box present.     Current BP Medications include:  Amlodipine 10mg , HCTZ 25mg , lisinopril 20mg , metoprolol succinate 25mg    Antihypertensives tried in the past include: diltiazem 120mg    O:   Physical Exam Constitutional:      Appearance: Normal appearance.  Musculoskeletal:        General: No swelling.  Neurological:     Mental Status: He is alert.  Psychiatric:        Mood and Affect: Mood normal.        Behavior: Behavior normal.     Review of Systems  Cardiovascular: Negative for leg swelling.  Neurological: Positive for dizziness and headaches.    Last 3 Office BP readings: BP Readings from Last 3 Encounters:  07/23/20 106/72  07/16/20 120/80  07/06/20 130/78     Clinical Atherosclerotic Cardiovascular Disease (ASCVD): Yes  - Hx of stroke   Basic Metabolic Panel    Component Value Date/Time   NA 146 (H) 07/06/2020 1012   K 3.8 07/06/2020 1012   CL 103 07/06/2020 1012   CO2 22 07/06/2020 1012   GLUCOSE 90 07/06/2020  1012   GLUCOSE 139 (H) 11/01/2019 0216   BUN 9 07/06/2020 1012   CREATININE 0.81 07/06/2020 1012   CALCIUM 10.0 07/06/2020 1012   GFRNONAA >60 11/01/2019 0216   GFRAA >60 11/01/2019 0216    ABPM Study Data: Arm Placement left arm   Overall Mean 24hr BP:   122/76 mmHg HR: 98  Daytime Mean BP:  122/76 mmHg HR: 98  Nighttime Mean BP:  NA mmHg HR: NA  Dipping Pattern: Unable to assess as blood pressure monitor broke while patient was wearing  Non-hypertensive ABPM thresholds: daytime BP <125/75 mmHg,    A/P: History of longstanding hypertension. 24-hour ambulatory blood pressure demonstrates adequate control with an average blood pressure of 122/76 mmHg. Unable to assess nocturnal dipping pattern as the blood pressure cuff broke while patient was wearing prior to sleep. Patient continues to complain of dizziness and feeling unsteady upon standing.  - Discontinue HCTZ 25mg  - Continue amlodipine 10mg , lisinopril 20mg , metoprolol succinate 25mg   - Continue to monitor for signs and symptoms of low blood pressure  - Follow up with PCP in 3-4 weeks with daughter and pill box/pill bottles present - If blood pressure elevation following HCTZ discontinuation, consider low dose HCTZ with ARB to replace lisinopril.    Results reviewed and written information provided.  Total time in face-to-face counseling 30 minutes.   F/U Clinic Visit with Dr. 09/05/2020.  Patient seen with Dr. 09/05/2020, PharmD, Dr. 09/05/2020, PharmD  resident, and Coralyn Helling, PharmD Student

## 2020-07-24 NOTE — Assessment & Plan Note (Signed)
History of longstanding hypertension. 24-hour ambulatory blood pressure demonstrates adequate control with an average blood pressure of 122/76 mmHg. Unable to assess nocturnal dipping pattern as the blood pressure cuff broke while patient was wearing prior to sleep. Patient continues to complain of dizziness and feeling unsteady upon standing.  - Discontinue HCTZ 25mg  - Continue amlodipine 10mg , lisinopril 20mg , metoprolol succinate 25mg   - Continue to monitor for signs and symptoms of low blood pressure  - Follow up with PCP in 3-4 weeks with daughter and pill box/pill bottles present - If blood pressure elevation following HCTZ discontinuation, consider low dose HCTZ with ARB to replace lisinopril.

## 2020-07-25 ENCOUNTER — Other Ambulatory Visit: Payer: Self-pay | Admitting: Physician Assistant

## 2020-07-30 NOTE — Progress Notes (Signed)
Reviewed: I agree with Dr. Koval's documentation and management. 

## 2020-08-03 ENCOUNTER — Telehealth: Payer: Self-pay | Admitting: Licensed Clinical Social Worker

## 2020-08-03 ENCOUNTER — Telehealth: Payer: Medicare Other

## 2020-08-03 NOTE — Chronic Care Management (AMB) (Signed)
    Clinical Social Work  Care Management   Phone Outreach    08/03/2020 Name: Kristopher Ruiz. MRN: [id number] DOB: 1950-08-23  Kristopher Ruiz. is a 70 y.o. year old male who is a primary care patient of Derrel Nip, MD .   F/U phone call today to assess needs, and progress with care plan goals.   Telephone outreach was unsuccessful A HIPPA compliant phone message was left for the patient providing contact information and requesting a return call.   Plan:CCM LCSW will wait for return call. If no return call is received, Will reach out to patient again in the next 30 days .   Review of patient status, including review of consultants reports, relevant laboratory and other test results, and collaboration with appropriate care team members and the patient's provider was performed as part of comprehensive patient evaluation and provision of care management services.    Sammuel Hines, LCSW Care Management & Coordination  [facility] / [facility]   [phone] 2:57 PM

## 2020-08-14 ENCOUNTER — Telehealth: Payer: Self-pay | Admitting: Licensed Clinical Social Worker

## 2020-08-14 ENCOUNTER — Ambulatory Visit: Payer: Medicare Other | Admitting: Licensed Clinical Social Worker

## 2020-08-14 NOTE — Chronic Care Management (AMB) (Signed)
Care Management   Clinical Social Work Note  08/14/2020 Name: Kristopher Ruiz. MRN: [id number] DOB: 01-14-1951  Kristopher Ruiz. is a 70 y.o. year old male who is a primary care patient of Gifford Shave, MD. The CCM team was consulted to assist the patient with chronic disease management and/or care coordination needs related to: Transportation Needs , Level of Care Concerns and Advanced Directive Education.   Collaboration with patietn's daughter Kristopher Ruiz for follow up visit in response to provider referral for social work chronic care management and care coordination services.   Consent to Services:  The patient was given information about Chronic Care Management services, agreed to services, and gave verbal consent prior to initiation of services.  Please see initial visit note for detailed documentation.   Patient agreed to services and consent obtained.   Assessment: Patient's Daughter Kristopher Ruiz provided all information during this encounter. Patient continues to experience difficulty with getting an agency to provide PCS.  Daughter is working with patient on other goals ( SCAT and Forensic scientist). See Care Plan below for interventions and patient self-care actives.  Follow up Plan: No follow up scheduled with CCM team at this time. Will follow up with patient's daugher in 7 to 14 days with update on progress with SCAT application.    : Review of patient past medical history, allergies, medications, and health status, including review of relevant consultants reports was performed today as part of a comprehensive evaluation and provision of chronic care management and care coordination services.     SDOH (Social Determinants of Health) assessments and interventions performed:    Advanced Directives Status: See Care Plan for related entries.  CCM Care Plan  Allergies  Allergen Reactions  . Ibuprofen Other (See Comments)    GI Problems    Outpatient Encounter Medications as of  08/14/2020  Medication Sig  . acetaminophen (TYLENOL) 500 MG tablet Take 500 mg by mouth at bedtime.  Marland Kitchen amLODipine (NORVASC) 10 MG tablet Take 1 tablet (10 mg total) by mouth daily.  Marland Kitchen aspirin 325 MG tablet Take 1 tablet (325 mg total) by mouth daily.  . ASPIRIN LOW DOSE 81 MG EC tablet Take 81 mg by mouth daily.  Marland Kitchen atorvastatin (LIPITOR) 80 MG tablet Take 1 tablet (80 mg total) by mouth daily.  . brimonidine (ALPHAGAN) 0.2 % ophthalmic solution   . hydrochlorothiazide (HYDRODIURIL) 25 MG tablet Take 1 tablet (25 mg total) by mouth every morning.  Marland Kitchen lisinopril (ZESTRIL) 20 MG tablet Take 1 tablet by mouth daily.  Marland Kitchen loratadine (CLARITIN) 10 MG tablet Take 10 mg by mouth daily as needed for allergies.  . methocarbamol (ROBAXIN) 500 MG tablet Take 1 tablet (500 mg total) by mouth every 8 (eight) hours as needed for muscle spasms.  . metoprolol succinate (TOPROL-XL) 25 MG 24 hr tablet Take 1 tablet (25 mg total) by mouth daily.  Marland Kitchen olopatadine (PATANOL) 0.1 % ophthalmic solution Place 1 drop into both eyes 2 (two) times daily.   Marland Kitchen oxyCODONE-acetaminophen (PERCOCET) 5-325 MG tablet Take 1-2 tablets by mouth every 6 (six) hours as needed for severe pain.  . predniSONE (DELTASONE) 10 MG tablet Take 1 tablet (10 mg total) by mouth daily with breakfast.  . RESTASIS 0.05 % ophthalmic emulsion Place 1 drop into both eyes 2 (two) times daily.    No facility-administered encounter medications on file as of 08/14/2020.    Patient Active Problem List   Diagnosis Date Noted  . Anemia 07/06/2020  .  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 Closed displaced fracture of left femoral neck with delayed healing 09/28/2019  . Establishing care with new doctor, encounter for 06/01/2019  . TIA (transient ischemic attack) 09/13/2018  . Cerebellar stroke ([place]) 09/06/2018  . Hypoglycemia due to insulin 09/06/2018  . Tobacco abuse 09/06/2018  . Hyperlipidemia 09/06/2018  . Essential hypertension  09/06/2018  . 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[place]) 04/16/2017    Conditions to be addressed/monitored: Transportation and Level of care concerns  Care Plan : General Social Work (Adult)  Updates made by Maurine Cane, LCSW since 08/14/2020 12:00 AM  Problem: No Advance Directive   Goal: Patient will have advance directive   Start Date: 07/18/2020  This Visit's Progress: Not on track  Recent Progress: On track  Priority: High  Current barriers:   . Patient does not have an advance directive.  . Needs education, support and coordination in order to meet this need. Clinical Goal(s): Over the next 60 days, the patient and daughter will review and complete advance directive packet and have notarized.  Interventions: . Inter-disciplinary care team collaboration (see longitudinal plan of care) . Assessed understanding of  and barriers with completing Advance Directives Patient Goals/Self-Care Activities : Over the next 30 days . Complete Advance Directive packet,  . Call LCSW if you have questions  . Have advance directive notarized and provide a copy to provider office   Problem: Mobility and Independence with ADL   Goal: Mobility and Independence Optimized with Personal Care Services   Start Date: 07/18/2020  Recent Progress: On track  Priority: High  Current Barriers:    Patient unable to consistently perform activities of daily living and needs assistance and support in order to meet this unmet need  Currently unable to independently self manage needs related to chronic health conditions.   Knowledge Deficits related to short term plan for care coordination needs and long term plans for chronic disease management needs Clinical Goals: Over the next 45 to 60 days, patient will have personal care needs met as evident by having PCS Aide in the home assisting with needs.  Clinical Interventions : . Assessed needs. Barriers to getting connected with Personal Care  Service process,  . Patient and daughter have selected [facility] as their provider however they need to select a new agency  . LCSW called [employer] at [phone] confirmed referral received; they have not been able to contact patient or daughter . Shared information with daughter and  advised her to call [facility] to schedule the assessment; review PCS process with daughter . Other interventions provided: Active listening / Reflection utilized  and Caregiver stress acknowledged  Patient Goals/Self-Care Activities: Over the next 5 days . Select a new PCS provider  . Call [facility] to schedule assessment [phone] or [phone]   Problem: Barriers to Treatment due to Transporation   Goal: Transporation needs met for medical and non-medical needs   Start Date: 07/18/2020  This Visit's Progress: On track  Recent Progress: On track  Priority: High  Current barriers:   . Patient in need of assistance with connecting to community resources for Transportation . Acknowledges deficits with meeting this unmet need . Patient is unable to independently navigate community resource options without care coordination support Clinical Goals: Patient and daughter will work with agencies discussed  to address needs related to transportation  Clinical Interventions:  . Inter-disciplinary care team collaboration (see longitudinal plan of care) .  Assessment of needs and barriers to care with SCAT and other transportation options.  . Review various resources, discussed options and provided patient information about Department of Social Services (food stamps, [employer], and utilities assistance), [employer] , Transportation provided by [employer] provider, and Enhanced Benefits connected with insurance provider . Referred patient to community resources care guide team for assistance with [employer].  . Assisted patient/caregiver with obtaining information about  health plan benefits . Other interventions provided:Solution-Focused Strategies and Active listening / Reflection utilized  Patient Goals/Self-Care Activities: Over the next 30 days . Call [place] [phone] to apply for [facility] Transportation  . Call your insurance provider to discuss transportation options . Call your insurance provider for more information about your Enhanced Benefits  . Complete SCAT application and bring in for PCP to complete      Casimer Lanius, [place] / [place]   [phone] 10:04 AM

## 2020-08-14 NOTE — Patient Instructions (Signed)
Visit Information  Goals Addressed            This Visit's Progress   . Client will have ADL needs addressed by [facility]       Patient Goals/Self-Care Activities: Over the next 5 days . Select a new PCS provider  . Call [facility] to schedule assessment [phone] or [phone]     . Find Help in My Community       Timeframe:  Short-Term Goal Priority:  High Start Date:   07/18/20                          Expected End Date:                        . Call Department of Social Services [phone] to apply for [facility] Transportation  . Call your insurance provider to discuss transportation options . Call your insurance provider for more information about your Enhanced Benefits  . Complete SCAT application and bring in for PCP to complete   Why is this important?    Knowing how and where to find help for yourself or family in your neighborhood and community is an important skill.        Patient's daughter verbalizes understanding of instructions provided today.    No follow up scheduled will contact you with an update after you drop off the SCAT application.    Sammuel Hines, LCSW Care Management & Coordination  [phone]

## 2020-08-14 NOTE — Chronic Care Management (AMB) (Signed)
    Clinical Social Work  Care Management   Phone Outreach    08/14/2020 Name: Demere Dotzler. MRN: [id number] DOB: Oct 29, 1950  Finlee Milo. is a 70 y.o. year old male who is a primary care patient of Derrel Nip, MD .   F/U phone call today to assess needs, and progress with care plan goals.   Telephone outreach was unsuccessful A HIPPA compliant phone message was left for the patient providing contact information and requesting a return call.   Plan:CCM LCSW will wait for return call. If no return call is received in 7 days, Will route chart to Care Guide to see if patient would like to reschedule phone appointment   Review of patient status, including review of consultants reports, relevant laboratory and other test results, and collaboration with appropriate care team members and the patient's provider was performed as part of comprehensive patient evaluation and provision of care management services.     Sammuel Hines, LCSW Care Management & Coordination  [facility] / [facility]   [phone] 8:54 AM

## 2020-08-21 ENCOUNTER — Telehealth: Payer: Self-pay | Admitting: *Deleted

## 2020-08-21 NOTE — Chronic Care Management (AMB) (Signed)
  Care Management   Note  08/21/2020 Name: Kristopher Ruiz. MRN: [id number] DOB: October 28, 1950  Kristopher Ruiz. is a 70 y.o. year old male who is a primary care patient of Derrel Nip, MD and is actively engaged with the care management team. I reached out to [name]. by phone today to assist with scheduling a follow up visit with the Licensed Clinical Social Worker.  Follow up plan: Unsuccessful telephone outreach attempt made. A HIPAA compliant phone message was left for the patient providing contact information and requesting a return call.  The care management team will reach out to the patient again over the next 7 days.  If patient returns call to provider office, please advise to call Embedded Care Management Care Guide Gwenevere Ghazi at [phone].  Gwenevere Ghazi  Care Guide, Embedded Care Coordination [facility] Management

## 2020-08-22 ENCOUNTER — Telehealth: Payer: Self-pay | Admitting: Family Medicine

## 2020-08-22 NOTE — Telephone Encounter (Signed)
[place] Application form dropped off for at front desk for completion.  Verified that patient section of form has been completed.  Last DOS/WCC with PCP was 07/06/20.  Placed form in team folder to be completed by clinical staff.  Kristopher Ruiz

## 2020-08-24 NOTE — Telephone Encounter (Signed)
Clinical info completed on [employer] [place] Applicaiton form.  Place form in Dr [employer] box for completion.  Sunday Spillers, CMA

## 2020-08-28 NOTE — Chronic Care Management (AMB) (Signed)
  Care Management   Note  08/28/2020 Name: [name]. MRN: [id number] DOB: 12-30-1950  Kristopher Ruiz. is a 70 y.o. year old male who is a primary care patient of Derrel Nip, MD and is actively engaged with the care management team. I reached out to Kristopher Ruiz. by phone today to assist with re-scheduling a follow up visit with the Licensed Clinical Social Worker  Follow up plan: A second unsuccessful telephone outreach attempt made. A HIPAA compliant phone message was left for the patient providing contact information and requesting a return call.  The care management team will reach out to the patient again over the next 7 days.  If patient returns call to provider office, please advise to call Embedded Care Management Care Guide Gwenevere Ghazi at [phone].  Gwenevere Ghazi  Care Guide, Embedded Care Coordination [facility] Management

## 2020-09-03 NOTE — Chronic Care Management (AMB) (Signed)
  Care Management   Note  09/03/2020 Name: Kristopher Ruiz. MRN: [id number] DOB: 06-06-1950  Kristopher Ruiz. is a 70 y.o. year old male who is a primary care patient of Derrel Nip, MD and is actively engaged with the care management team. I reached out to [name]. by phone today to assist with re-scheduling a follow up visit with the Licensed Clinical Social Worker  Follow up plan: Unsuccessful telephone outreach attempt made. A HIPAA compliant phone message was left for the patient providing contact information and requesting a return call.  Unable to make contact on outreach attempts x 3. PCP Derrel Nip, MD notified via routed documentation in medical record.  Appropriate care team members and provider have been notified via electronic communication.We have been unable to make contact with the patient for follow up. The care management team is available to follow up with the patient after provider conversation with the patient regarding recommendation for care management engagement and subsequent re-referral to the care management team.   [facility] Guide, Embedded Care Coordination [facility]  Care Management

## 2020-09-04 ENCOUNTER — Ambulatory Visit: Payer: Self-pay | Admitting: Licensed Clinical Social Worker

## 2020-09-04 NOTE — Patient Instructions (Signed)
Visit Information  Goals Addressed            This Visit's Progress   . Find Help in My Community   On track    Timeframe:  Short-Term Goal Priority:  High Start Date:   07/18/20                          Expected End Date:                       . Call Department of Social Services [phone] to apply for [facility] Transportation  . Call your insurance provider to discuss transportation options . Call your insurance provider for more information about your Enhanced Benefits  . Follow up on SCAT application to get assessment scheduled   Why is this important?    Knowing how and where to find help for yourself or family in your neighborhood and community is an important skill.      We have been unable to make contact with the patient's daughter for follow up. The care management team is available to follow up with the patient after provider conversation with the patient regarding recommendation for care management engagement and subsequent re-referral to the care management team.   Sammuel Hines, LCSW Care Management & Coordination  [phone]

## 2020-09-04 NOTE — Chronic Care Management (AMB) (Signed)
Care Management  Collaboration  Note  09/04/2020 Name: Kristopher Ruiz. MRN: [id number] DOB: 1950-04-18  Kristopher Ruiz. is a 70 y.o. year old male who is a primary care patient of Gifford Shave, MD. The CCM team was consulted reference care coordination needs for Transportation Needs  and Level of Care Concerns.  Assessment: Patient was not interviewed or contacted during this encounter. CCM Care guide has not been successful in reaching patients' daughter, CCM team has been disconnected from care team.  CCM LCSW collaborated with PCP and SCAT staff during this encounter to finalize SCAT application. See Care Plan below for interventions and patient self-care actives.   Follow up Plan: No follow up scheduled with CCM team at this time. CCM has disconnected from care team due to 3 unsuccessful outreaches.    Review of patient past medical history, allergies, medications, and health status, including review of pertinent consultant reports was performed as part of comprehensive evaluation and provision of care management/care coordination services.   Care Plan Conditions to be addressed/monitored per PCP order:  Transportation and Level of care concerns  Patient Care Plan: General Social Work (Adult)  Problem Identified: No Advance Directive   Goal: Patient will have advance directive   Start Date: 07/18/2020  This Visit's Progress: Not on track  Recent Progress: On track  Priority: High  Note:   Current barriers:   . Patient does not have an advance directive.  . Needs education, support and coordination in order to meet this need. Clinical Goal(s): Over the next 60 days, the patient and daughter will review and complete advance directive packet and have notarized.  Interventions: . Inter-disciplinary care team collaboration (see longitudinal plan of care) . Assessed understanding of  and barriers with completing Advance Directives Patient Goals/Self-Care Activities : Over the next 30  days . Complete Advance Directive packet,  . Call LCSW if you have questions  . Have advance directive notarized and provide a copy to provider office   Problem Identified: Mobility and Independence with ADL   Goal: Mobility and Independence Optimized with Personal Care Services   Start Date: 07/18/2020  Recent Progress: On track  Priority: High  Current Barriers:    Patient unable to consistently perform activities of daily living and needs assistance and support in order to meet this unmet need  Currently unable to independently self manage needs related to chronic health conditions.   Knowledge Deficits related to short term plan for care coordination needs and long term plans for chronic disease management needs Clinical Goals: Over the next 45 to 60 days, patient will have personal care needs met as evident by having PCS Aide in the home assisting with needs.  Clinical Interventions : . Assessed needs. Barriers to getting connected with Personal Care Service process,  . Patient and daughter have selected [facility] as their provider however they need to select a new agency  . LCSW called [employer] at [phone] confirmed referral received; they have not been able to contact patient or daughter . Shared information with daughter and  advised her to call [facility] to schedule the assessment; review PCS process with daughter . Other interventions provided: Active listening / Reflection utilized  and Caregiver stress acknowledged  Patient Goals/Self-Care Activities: Over the next 5 days . Select a new PCS provider  . Call [facility] to schedule assessment [phone] or [phone]   Problem Identified: Barriers to Treatment due to Transporation   Goal: Transporation needs met for  medical and non-medical needs   Start Date: 07/18/2020  This Visit's Progress: On track  Recent Progress: On track  Priority: High  Current barriers:   . Patient in need  of assistance with connecting to community resources for Transportation . Acknowledges deficits with meeting this unmet need . Patient is unable to independently navigate community resource options without care coordination support Clinical Goals: Patient and daughter will work with agencies discussed  to address needs related to transportation  Clinical Interventions:  . Inter-disciplinary care team collaboration (see longitudinal plan of care) . Collaborated with PCP to complete SCAT application  . SCAT application faxed 12/03/7094; Left message for patient's daughter to follow on application  Patient Goals/Self-Care Activities: Over the next 30 days . Call [place] [phone] to apply for [facility] Transportation  . Call your insurance provider to discuss transportation options . Call your insurance provider for more information about your Enhanced Benefits  . Follow up on SCAT application to get assessment scheduled      Casimer Lanius, [place] / [place]   [phone] 9:01 AM

## 2020-09-25 ENCOUNTER — Other Ambulatory Visit: Payer: Self-pay | Admitting: Family Medicine

## 2020-12-24 ENCOUNTER — Other Ambulatory Visit: Payer: Self-pay | Admitting: Family Medicine

## 2021-01-04 ENCOUNTER — Telehealth: Payer: Self-pay

## 2021-01-04 NOTE — Telephone Encounter (Signed)
Pt's daughter stated she would like to get pt worked in as soon as possible to get fitted for a new leg. Please advise If I can work him in and I can call the pt back to schedule

## 2021-01-04 NOTE — Telephone Encounter (Signed)
Lvm for pt to cb to schedule

## 2021-01-04 NOTE — Telephone Encounter (Signed)
No time today Kristopher Ruiz has time on Wednesday if you don't mind calling?

## 2021-02-15 ENCOUNTER — Other Ambulatory Visit: Payer: Self-pay

## 2021-02-15 ENCOUNTER — Ambulatory Visit (INDEPENDENT_AMBULATORY_CARE_PROVIDER_SITE_OTHER): Payer: Medicare Other | Admitting: Family Medicine

## 2021-02-15 ENCOUNTER — Encounter: Payer: Self-pay | Admitting: Family Medicine

## 2021-02-15 VITALS — BP 141/87 | HR 89 | Ht 63.0 in | Wt 161.0 lb

## 2021-02-15 DIAGNOSIS — Z23 Encounter for immunization: Secondary | ICD-10-CM | POA: Diagnosis not present

## 2021-02-15 DIAGNOSIS — F17209 Nicotine dependence, unspecified, with unspecified nicotine-induced disorders: Secondary | ICD-10-CM

## 2021-02-15 MED ORDER — NICOTINE 7 MG/24HR TD PT24: 7.0000 mg | MEDICATED_PATCH | Freq: Every day | TRANSDERMAL | 0 refills | Status: DC

## 2021-02-15 MED ORDER — TETANUS-DIPHTH-ACELL PERTUSSIS 5-2.5-18.5 LF-MCG/0.5 IM SUSP: 0.5000 mL | Freq: Once | INTRAMUSCULAR | 0 refills | Status: AC

## 2021-02-15 NOTE — Addendum Note (Signed)
Addended by: Lamonte Sakai, Connar Keating D on: 02/15/2021 04:44 PM   Modules accepted: Orders

## 2021-02-15 NOTE — Progress Notes (Addendum)
    SUBJECTIVE:   CHIEF COMPLAINT / HPI:   Kristopher Washam. is a 70 y.o. male with a PMH of TIA, HTN, and HLD presenting to the clinic for a discussion of circumcision.  Circumcision discussion The patient presents to the clinic today to discuss a circumcision referral. His stated reason for wanting the procedure is that a preacher told him it is important for getting into heaven. He also noticed that his grandson was recently circumcised. He does not have any medical concerns that would require circumcision and denies any genital pain, irritation, or difficulty with his foreskin.  PERTINENT  PMH / PSH:   The patient is currently smoking 1-4 cigarettes a day. He is working on quitting and would like a prescription for nicotine patches to help.  OBJECTIVE:   BP (!) 141/87   Pulse 89   Ht 5\' 3"  (1.6 m)   Wt 161 lb (73 kg)   SpO2 98%   BMI 28.52 kg/m   General: age-appropriate, resting comfortably in wheelchair, NAD, WNWD, alert and at baseline Cardiovascular: Regular rate and rhythm. Normal S1/S2. No murmurs, rubs, or gallops appreciated. 2+ radial pulses. Pulmonary: Clear bilaterally to ascultation. No increased WOB, no accessory muscle usage. No wheezes, rales, or crackles. Extremities: no edema or cyanosis Psych: Pleasant and appropriate   ASSESSMENT/PLAN:   No problem-specific Assessment & Plan notes found for this encounter.  Circumcision discussion After discussing the procedure with the patient, I reassured him that it is normal to not be circumcised. I could not speak to the faith-based concerns myself, but I suggested following up with the preacher who first mentioned the procedure to clarify. I also mentioned the expense of the procedure and the fact that it requires surgery. The patient is reassured and states that he does not want the procedure at this time.  Smoking cessation Discussed the patient's goals and progress towards quitting. Prescribed Nicoderm patches per  the patient's request and referred him to [facility] smoking cessation program with Dr. .  Preventative Administered influenza and pneumonia vaccines. Sent a Tdap prescription to pharmacy. The patient states he has gotten the COVID vaccine and boosters and will bring his records in the future.  Dimitry Raymondo Band, Medical Student [facility]    Resident Attestation  I saw and evaluated the patient, performing the key elements of the service.I  personally performed or re-performed the history, physical exam, and medical decision making activities of this service and have verified that the service and findings are accurately documented in the student's note. I developed the management plan that is described in the medical student's note, and I agree with the content, with my edits above.    Hospital doctor, PGY3

## 2021-02-15 NOTE — Patient Instructions (Addendum)
It was great seeing you today.  I am glad you are doing well.  We are giving you the flu shot today.  I will send a message to our pharmacist regarding your smoking cessation.  I sent this prescription in for your NicoDerm patches.  I also sent a prescription for your Tdap vaccine.  If you have any questions or concerns call the clinic.  Happy have a great afternoon!

## 2021-02-28 ENCOUNTER — Telehealth: Payer: Self-pay | Admitting: Pharmacist

## 2021-02-28 DIAGNOSIS — Z720 Tobacco use: Secondary | ICD-10-CM

## 2021-02-28 NOTE — Telephone Encounter (Signed)
Noted and agree. 

## 2021-02-28 NOTE — Telephone Encounter (Signed)
Patient contacted for follow/up of tobacco cessation attempt as requested by PCP, Dr. Nobie Putnam.  Since last contact patient reports continued smoking.  He states that he buys a pack routinely and smokes as much as 10 cigarettes per day.  He reports giving a large amount of cigarettes away.    Medications currently being used; None Nicotine patch - NOT yet picked up BUT is planning to pick up tomorrow following receipt of his check.  Rates IMPORTANCE of quitting tobacco as high.  He verbalized plan to stop smoking completely when he starts the patch.  Total time with patient call and documentation of interaction: 13 minutes.  F/U Phone call planned: 1 week to reassess if product is obtained/used and if he has decreased intake or quit.

## 2021-02-28 NOTE — Telephone Encounter (Signed)
-----   Message from Derrel Nip, MD sent at 02/15/2021  2:42 PM EST ----- Hey, this patient is interested in smoking cessation.  I sent a prescription for his patches to the pharmacy.  Could you follow-up with him on how he is doing.  He is currently smoking 1 to 4 cigarettes daily. Thanks, Alecia Lemming

## 2021-02-28 NOTE — Assessment & Plan Note (Signed)
Patient contacted for follow/up of tobacco cessation attempt as requested by PCP, Dr. Cresenzo.  Since last contact patient reports continued smoking.  He states that he buys a pack routinely and smokes as much as 10 cigarettes per day.  He reports giving a large amount of cigarettes away.    Medications currently being used; None Nicotine patch - NOT yet picked up BUT is planning to pick up tomorrow following receipt of his check.  Rates IMPORTANCE of quitting tobacco as high.  He verbalized plan to stop smoking completely when he starts the patch.  Total time with patient call and documentation of interaction: 13 minutes.  F/U Phone call planned: 1 week to reassess if product is obtained/used and if he has decreased intake or quit.    

## 2021-03-07 ENCOUNTER — Telehealth: Payer: Self-pay | Admitting: Pharmacist

## 2021-03-07 DIAGNOSIS — Z720 Tobacco use: Secondary | ICD-10-CM

## 2021-03-07 NOTE — Telephone Encounter (Signed)
Patient contacted for follow/up of tobacco intake reduction / tobacco cessation attempt.   Since last contact patient reports he has obtained nicotine patches and is wearing them.  He states they have helped.  He reports smoking only 1 cigarette per day.  He reports his current supply of cigarettes is about 1/2 of a pack  Medications currently being used;  Nicotine patch - 7mg    Patient denies any significant side effects from tobacco cessation therapy.   Rates IMPORTANCE of quitting tobacco as high.   Motivation to quit: breathing  He agreed to a 10 day follow-up phone call to assess progress. Potentially QUIT.  Total time with patient call and documentation of interaction: 13 minutes.  F/U Phone call planned: 10 days.

## 2021-03-07 NOTE — Telephone Encounter (Signed)
-----   Message from Kathrin Ruddy, RPH-CPP sent at 02/28/2021  3:32 PM EST ----- Regarding: Quit with use of patch

## 2021-03-07 NOTE — Assessment & Plan Note (Signed)
Patient contacted for follow/up of tobacco intake reduction / tobacco cessation attempt.   Since last contact patient reports he has obtained nicotine patches and is wearing them.  He states they have helped.  He reports smoking only 1 cigarette per day.  He reports his current supply of cigarettes is about 1/2 of a pack  Medications currently being used;  Nicotine patch - 7mg    Patient denies any significant side effects from tobacco cessation therapy.   Rates IMPORTANCE of quitting tobacco as high.   Motivation to quit: breathing  He agreed to a 10 day follow-up phone call to assess progress. Potentially QUIT.

## 2021-03-18 ENCOUNTER — Telehealth: Payer: Self-pay | Admitting: Pharmacist

## 2021-03-18 ENCOUNTER — Telehealth: Payer: Self-pay

## 2021-03-18 DIAGNOSIS — Z720 Tobacco use: Secondary | ICD-10-CM

## 2021-03-18 NOTE — Telephone Encounter (Signed)
-----   Message from Kathrin Ruddy, RPH-CPP sent at 03/07/2021  2:13 PM EST ----- Regarding: Quit with use of 7mg  patches? Was using 1 cig per day with 7mg  patch 03/07/2021

## 2021-03-18 NOTE — Telephone Encounter (Signed)
Pt called into the office and would like a referral to be sent out for the patient to get a new leg.   Please advise

## 2021-03-18 NOTE — Assessment & Plan Note (Signed)
Patient contacted for follow/up of tobacco intake reduction / tobacco cessation attempt.   Since last contact patient reports smoking due to anger but only 1 cigarette per day.   Medications currently being used;  Nicotine patch - 7mg   Patient denies any significant side effects from tobacco cessation therapy.   Rates IMPORTANCE of quitting tobacco as high.  States he has only 4 cigarettes left and he plans to quit completely when he uses all of his remaining cigarettes.   Most common triggers to use tobacco include; anger   Motivation to quit: health/breathing   Total time with patient call and documentation of interaction: 12 minutes.  F/U Phone call planned: 2-3 weeks.  Discussed plan to stay on 7mg  nicotine patch for at least 30 days after quitting completely.  Patient verbalized understanding of treatment plan.

## 2021-03-18 NOTE — Telephone Encounter (Signed)
Noted and agree. 

## 2021-03-18 NOTE — Telephone Encounter (Signed)
Patient contacted for follow/up of tobacco intake reduction / tobacco cessation attempt.  ° °Since last contact patient reports smoking due to anger but only 1 cigarette per day.  ° °Medications currently being used;  °Nicotine patch - 7mg ° °Patient denies any significant side effects from tobacco cessation therapy.  ° °Rates IMPORTANCE of quitting tobacco as high. ° °States he has only 4 cigarettes left and he plans to quit completely when he uses all of his remaining cigarettes.  ° °Most common triggers to use tobacco include; anger  ° °Motivation to quit: health/breathing ° ° °Total time with patient call and documentation of interaction: 12 minutes. ° °F/U Phone call planned: 2-3 weeks.  Discussed plan to stay on 7mg nicotine patch for at least 30 days after quitting completely.  Patient verbalized understanding of treatment plan.  °

## 2021-03-19 NOTE — Telephone Encounter (Signed)
Rx faxed to [facility], pt informed.

## 2021-03-25 ENCOUNTER — Other Ambulatory Visit: Payer: Self-pay | Admitting: Family Medicine

## 2021-04-05 ENCOUNTER — Telehealth: Payer: Self-pay | Admitting: Pharmacist

## 2021-04-05 DIAGNOSIS — Z720 Tobacco use: Secondary | ICD-10-CM

## 2021-04-05 MED ORDER — LISINOPRIL 20 MG PO TABS: 20.0000 mg | ORAL_TABLET | Freq: Every day | ORAL | 1 refills | Status: DC

## 2021-04-05 NOTE — Telephone Encounter (Signed)
-----   Message from Leavy Cella, [place] sent at 03/18/2021 11:35 AM EST ----- Regarding: Tobacco QUIT On 7mg  patch and smoking 1 per day 03/18/2021 - only has 4 cigs left

## 2021-04-05 NOTE — Telephone Encounter (Signed)
Patient contacted for follow/up of tobacco intake reduction / tobacco cessation attempt.   Since last contact patient reports not smoking for last two weeks.    Medications currently being used;  Nicotine patch - 7mg  - has 11 patches remaining - patient would like to stop after using these 11 patches.  Agreed with plan to quit using patches in 11 days.    Patient denies any significant side effects from tobacco cessation therapy.    Motivation to quit: Breathing   Requested refill of Lisinopril - provided.   Total time with patient call and documentation of interaction: 14 minutes.  F/U Phone call planned: 2 months for 3 month quit date and 2 month off tx

## 2021-04-05 NOTE — Assessment & Plan Note (Signed)
Patient contacted for follow/up of tobacco intake reduction / tobacco cessation attempt.   Since last contact patient reports not smoking for last two weeks.    Medications currently being used;  Nicotine patch - 7mg  - has 11 patches remaining - patient would like to stop after using these 11 patches.  Agreed with plan to quit using patches in 11 days.    Patient denies any significant side effects from tobacco cessation therapy.

## 2021-04-05 NOTE — Telephone Encounter (Signed)
Noted and agree. 

## 2021-04-10 ENCOUNTER — Other Ambulatory Visit: Payer: Self-pay

## 2021-04-10 ENCOUNTER — Ambulatory Visit (INDEPENDENT_AMBULATORY_CARE_PROVIDER_SITE_OTHER): Payer: Medicare Other

## 2021-04-10 DIAGNOSIS — Z0000 Encounter for general adult medical examination without abnormal findings: Secondary | ICD-10-CM | POA: Diagnosis not present

## 2021-04-10 NOTE — Patient Instructions (Addendum)
You spoke to Kristopher Ruiz, Kristopher Ruiz over the phone for your annual wellness visit.  We discussed goals:   Goals      Client will have ADL needs addressed by [facility]     Patient Goals/Self-Care Activities: Over the next 5 days Select a new PCS provider  Call [facility] to schedule assessment [phone] or [phone]      Effective Long-Term Care Planning     Patient Goals/Self-Care Activities :  Complete Advance Directive packet,  Call LCSW if you have questions  Have advance directive notarized and provide a copy to provider office       Find Help in My Community     Timeframe:  Short-Term Goal Priority:  High Start Date:   07/18/20                          Expected End Date:                        Call Department of Social Services [phone] to apply for Medicaid Transportation  Call your insurance provider to discuss transportation options Call your insurance provider for more information about your Enhanced Benefits  Follow up on SCAT application to get assessment scheduled   Why is this important?   Knowing how and where to find help for yourself or family in your neighborhood and community is an important skill.       Quit Smoking     Patient reports uses nicotine patches and reports ~2 cigs per day now.       We also discussed recommended health maintenance. Please call our office and schedule a visit. As discussed, you are due for: Health Maintenance  Topic Date Due   Hepatitis C Screening  Never done   TETANUS/TDAP  Never done   COLONOSCOPY (Pts 45-31yrs Insurance coverage will need to be confirmed)  Never done   Zoster Vaccines- Shingrix (1 of 2) Never done   COVID-19 Vaccine (2 - Moderna series) 11/30/2019   Pneumonia Vaccine 23+ Years old  Completed   INFLUENZA VACCINE  Completed   HPV VACCINES  Aged Out   Follow-up with PCP as needed.  Bring covid card to next apt. Go to [facility] for Tetanus Vaccine. Let us know when you need more nicotine  patches.  Keep up the good work with smoking cessation!  We also discussed smoking cessation.  1-800-QUIT-NOW  Preventive Care 56 Years and Older, Male Preventive care refers to lifestyle choices and visits with your health care provider that can promote health and wellness. Preventive care visits are also called wellness exams. What can I expect for my preventive care visit? Counseling During your preventive care visit, your health care provider may ask about your: Medical history, including: Past medical problems. Family medical history. History of falls. Current health, including: Emotional well-being. Home life and relationship well-being. Sexual activity. Memory and ability to understand (cognition). Lifestyle, including: Alcohol, nicotine or tobacco, and drug use. Access to firearms. Diet, exercise, and sleep habits. Work and work Statistician. Sunscreen use. Safety issues such as seatbelt and bike helmet use. Physical exam Your health care provider will check your: Height and weight. These may be used to calculate your BMI (body mass index). BMI is a measurement that tells if you are at a healthy weight. Waist circumference. This measures the distance around your waistline. This measurement also tells if you are at a healthy weight  and may help predict your risk of certain diseases, such as type 2 diabetes and high blood pressure. Heart rate and blood pressure. Body temperature. Skin for abnormal spots. What immunizations do I need? Vaccines are usually given at various ages, according to a schedule. Your health care provider will recommend vaccines for you based on your age, medical history, and lifestyle or other factors, such as travel or where you work. What tests do I need? Screening Your health care provider may recommend screening tests for certain conditions. This may include: Lipid and cholesterol levels. Diabetes screening. This is done by checking your blood  sugar (glucose) after you have not eaten for a while (fasting). Hepatitis C test. Hepatitis B test. HIV (human immunodeficiency virus) test. STI (sexually transmitted infection) testing, if you are at risk. Lung cancer screening. Colorectal cancer screening. Prostate cancer screening. Abdominal aortic aneurysm (AAA) screening. You may need this if you are a current or former smoker. Talk with your health care provider about your test results, treatment options, and if necessary, the need for more tests. Follow these instructions at home: Eating and drinking  Eat a diet that includes fresh fruits and vegetables, whole grains, lean protein, and low-fat dairy products. Limit your intake of foods with high amounts of sugar, saturated fats, and salt. Take vitamin and mineral supplements as recommended by your health care provider. Do not drink alcohol if your health care provider tells you not to drink. If you drink alcohol: Limit how much you have to 0-2 drinks a day. Know how much alcohol is in your drink. In the [place] one drink equals one 12 oz bottle of beer (355 mL), one 5 oz glass of wine (148 mL), or one 1 oz glass of hard liquor (44 mL). Lifestyle Brush your teeth every morning and night with fluoride toothpaste. Floss one time each day. Exercise for at least 30 minutes 5 or more days each week. Do not use any products that contain nicotine or tobacco. These products include cigarettes, chewing tobacco, and vaping devices, such as e-cigarettes. If you need help quitting, ask your health care provider. Do not use drugs. If you are sexually active, practice safe sex. Use a condom or other form of protection to prevent STIs. Take aspirin only as told by your health care provider. Make sure that you understand how much to take and what form to take. Work with your health care provider to find out whether it is safe and beneficial for you to take aspirin daily. Ask your health care provider  if you need to take a cholesterol-lowering medicine (statin). Find healthy ways to manage stress, such as: Meditation, yoga, or listening to music. Journaling. Talking to a trusted person. Spending time with friends and family. Safety Always wear your seat belt while driving or riding in a vehicle. Do not drive: If you have been drinking alcohol. Do not ride with someone who has been drinking. When you are tired or distracted. While texting. If you have been using any mind-altering substances or drugs. Wear a helmet and other protective equipment during sports activities. If you have firearms in your house, make sure you follow all gun safety procedures. Minimize exposure to UV radiation to reduce your risk of skin cancer. What's next? Visit your health care provider once a year for an annual wellness visit. Ask your health care provider how often you should have your eyes and teeth checked. Stay up to date on all vaccines. This information is not  intended to replace advice given to you by your health care provider. Make sure you discuss any questions you have with your health care provider. Document Revised: 09/12/2020 Document Reviewed: 09/12/2020 Elsevier Patient Education  2022 [place] Prevention in the Home, Adult Falls can cause injuries and can happen to people of all ages. There are many things you can do to make your home safe and to help prevent falls. Ask for help when making these changes. What actions can I take to prevent falls? General Instructions Use good lighting in all rooms. Replace any light bulbs that burn out. Turn on the lights in dark areas. Use night-lights. Keep items that you use often in easy-to-reach places. Lower the shelves around your home if needed. Set up your furniture so you have a clear path. Avoid moving your furniture around. Do not have throw rugs or other things on the floor that can make you trip. Avoid walking on wet  floors. If any of your floors are uneven, fix them. Add color or contrast paint or tape to clearly mark and help you see: Grab bars or handrails. First and last steps of staircases. Where the edge of each step is. If you use a stepladder: Make sure that it is fully opened. Do not climb a closed stepladder. Make sure the sides of the stepladder are locked in place. Ask someone to hold the stepladder while you use it. Know where your pets are when moving through your home. What can I do in the bathroom?   Keep the floor dry. Clean up any water on the floor right away. Remove soap buildup in the tub or shower. Use nonskid mats or decals on the floor of the tub or shower. Attach bath mats securely with double-sided, nonslip rug tape. If you need to sit down in the shower, use a plastic, nonslip stool. Install grab bars by the toilet and in the tub and shower. Do not use towel bars as grab bars. What can I do in the bedroom? Make sure that you have a light by your bed that is easy to reach. Do not use any sheets or blankets for your bed that hang to the floor. Have a firm chair with side arms that you can use for support when you get dressed. What can I do in the kitchen? Clean up any spills right away. If you need to reach something above you, use a step stool with a grab bar. Keep electrical cords out of the way. Do not use floor polish or wax that makes floors slippery. What can I do with my stairs? Do not leave any items on the stairs. Make sure that you have a light switch at the top and the bottom of the stairs. Make sure that there are handrails on both sides of the stairs. Fix handrails that are broken or loose. Install nonslip stair treads on all your stairs. Avoid having throw rugs at the top or bottom of the stairs. Choose a carpet that does not hide the edge of the steps on the stairs. Check carpeting to make sure that it is firmly attached to the stairs. Fix carpet that is  loose or worn. What can I do on the outside of my home? Use bright outdoor lighting. Fix the edges of walkways and driveways and fix any cracks. Remove anything that might make you trip as you walk through a door, such as a raised step or threshold. Trim any bushes or trees  on paths to your home. Check to see if handrails are loose or broken and that both sides of all steps have handrails. Install guardrails along the edges of any raised decks and porches. Clear paths of anything that can make you trip, such as tools or rocks. Have leaves, snow, or ice cleared regularly. Use sand or salt on paths during winter. Clean up any spills in your garage right away. This includes grease or oil spills. What other actions can I take? Wear shoes that: Have a low heel. Do not wear high heels. Have rubber bottoms. Feel good on your feet and fit well. Are closed at the toe. Do not wear open-toe sandals. Use tools that help you move around if needed. These include: Canes. Walkers. Scooters. Crutches. Review your medicines with your doctor. Some medicines can make you feel dizzy. This can increase your chance of falling. Ask your doctor what else you can do to help prevent falls. Where to find more information Centers for Disease Control and Prevention, [employer]: http://www.wolf.info/ National Institute on Aging: http://kim-miller.com/ Contact a doctor if: You are afraid of falling at home. You feel weak, drowsy, or dizzy at home. You fall at home. Summary There are many simple things that you can do to make your home safe and to help prevent falls. Ways to make your home safe include removing things that can make you trip and installing grab bars in the bathroom. Ask for help when making these changes in your home. This information is not intended to replace advice given to you by your health care provider. Make sure you discuss any questions you have with your health care provider. Document Revised: 10/19/2019  Document Reviewed: 10/19/2019 Elsevier Patient Education  2022 [employer].   Our clinic's number is [phone]. Please call with questions or concerns about what we discussed today.

## 2021-04-10 NOTE — Progress Notes (Addendum)
I have reviewed this visit and agree with the documentation.   Burley Saver MD      Subjective:   Kristopher Ruiz. is a 71 y.o. male who presents for Medicare Annual/Subsequent preventive examination.  The patient consented to a virtual visit. Patient consented to have virtual visit and was identified by name and date of birth. Method of visit: Telephone  Encounter participants: Patient: Kristopher Ruiz. - located at Home Nurse/Provider: Steva Colder - located at [facility] Others (if applicable): NA  Review of Systems: Defer to PCP  Cardiac Risk Factors include: advanced age (>76men, >47 women);male gender;smoking/ tobacco exposure  Objective:   Vitals: There were no vitals taken for this visit.  There is no height or weight on file to calculate BMI.  Advanced Directives 04/10/2021 07/06/2020 10/31/2019 09/14/2018 09/06/2018  Does Patient Have a Medical Advance Directive? No No No No No  Would patient like information on creating a medical advance directive? No - Patient declined No - Patient declined No - Patient declined No - Patient declined No - Patient declined   Tobacco Social History   Tobacco Use  Smoking Status Every Day   Types: Cigarettes  Smokeless Tobacco Never  Tobacco Comments   2 cigs per day   Ready to quit: Yes Counseling given: Yes Tobacco comments: 2 cigs per day  Clinical Intake:  Pre-visit preparation completed: Yes  How often do you need to have someone help you when you read instructions, pamphlets, or other written materials from your doctor or pharmacy?: 4 - Often What is the last grade level you completed in school?: 4th grade education  Past Medical History:  Diagnosis Date   Arthritis    Cataract    NS OU   Hypertension    Stroke [facility])    Past Surgical History:  Procedure Laterality Date   HIP FRACTURE SURGERY     LEG AMPUTATION BELOW KNEE     left leg   TOTAL HIP ARTHROPLASTY Left 10/31/2019   TOTAL HIP ARTHROPLASTY Left 10/31/2019    Procedure: LEFT TOTAL HIP ARTHROPLASTY, POSTERIOR;  Surgeon: Tarry Kos, MD;  Location: [facility] OR;  Service: Orthopedics;  Laterality: Left;   Family History  Problem Relation Age of Onset   Hypertension Mother    Hypertension Father    Social History   Socioeconomic History   Marital status: Widowed    Spouse name: Not on file   Number of children: 2   Years of education: 4   Highest education level: 4th grade  Occupational History   Occupation: Disability  Tobacco Use   Smoking status: Every Day    Types: Cigarettes   Smokeless tobacco: Never   Tobacco comments:    2 cigs per day  Vaping Use   Vaping Use: Never used  Substance and Sexual Activity   Alcohol use: No   Drug use: No   Sexual activity: Not Currently  Other Topics Concern   Not on file  Social History Narrative   Patient lives in [place] with his roommate [name].   Patient has an aide 7 days a week for 2 hours to help with cooking and cleaning.    Patient has 2 two daughters in [place].    Patient has a step daughter here who looks after him. Shanda Bumps [facility] patient.)      Social Determinants of Health   Financial Resource Strain: Low Risk    Difficulty of Paying Living Expenses: Not hard at all  Food Insecurity: No Food Insecurity   Worried About Charity fundraiser in the Last Year: Never true   Ran Out of Food in the Last Year: Never true  Transportation Needs: No Transportation Needs   Lack of Transportation (Medical): No   Lack of Transportation (Non-Medical): No  Physical Activity: Insufficiently Active   Days of Exercise per Week: 7 days   Minutes of Exercise per Session: 20 min  Stress: Stress Concern Present   Feeling of Stress : To some extent  Social Connections: Moderately Isolated   Frequency of Communication with Friends and Family: Three times a week   Frequency of Social Gatherings with Friends and Family: Twice a week   Attends Religious Services: More than 4 times per year    Active Member of [employer] or Organizations: No   Attends Archivist Meetings: Never   Marital Status: Widowed   Outpatient Encounter Medications as of 04/10/2021  Medication Sig   acetaminophen (TYLENOL) 500 MG tablet Take 500 mg by mouth at bedtime.   amLODipine (NORVASC) 10 MG tablet Take 1 tablet (10 mg total) by mouth daily.   aspirin 325 MG tablet Take 1 tablet (325 mg total) by mouth daily.   atorvastatin (LIPITOR) 80 MG tablet Take 1 tablet (80 mg total) by mouth daily.   hydrochlorothiazide (HYDRODIURIL) 25 MG tablet Take 1 tablet (25 mg total) by mouth every morning.   lisinopril (ZESTRIL) 20 MG tablet Take 1 tablet (20 mg total) by mouth daily.   loratadine (CLARITIN) 10 MG tablet Take 10 mg by mouth daily as needed for allergies.   metoprolol succinate (TOPROL-XL) 25 MG 24 hr tablet TAKE 1 TABLET (25 MG TOTAL) BY MOUTH DAILY.   nicotine (NICODERM CQ) 7 mg/24hr patch Place 1 patch (7 mg total) onto the skin daily.   predniSONE (DELTASONE) 10 MG tablet Take 1 tablet (10 mg total) by mouth daily with breakfast.   ASPIRIN LOW DOSE 81 MG EC tablet Take 81 mg by mouth daily. (Patient not taking: Reported on 04/10/2021)   brimonidine (ALPHAGAN) 0.2 % ophthalmic solution  (Patient not taking: Reported on 04/10/2021)   methocarbamol (ROBAXIN) 500 MG tablet Take 1 tablet (500 mg total) by mouth every 8 (eight) hours as needed for muscle spasms. (Patient not taking: Reported on 04/10/2021)   olopatadine (PATANOL) 0.1 % ophthalmic solution Place 1 drop into both eyes 2 (two) times daily.  (Patient not taking: Reported on 04/10/2021)   oxyCODONE-acetaminophen (PERCOCET) 5-325 MG tablet Take 1-2 tablets by mouth every 6 (six) hours as needed for severe pain. (Patient not taking: Reported on 04/10/2021)   RESTASIS 0.05 % ophthalmic emulsion Place 1 drop into both eyes 2 (two) times daily.  (Patient not taking: Reported on 04/10/2021)   No facility-administered encounter medications on file as  of 04/10/2021.   Activities of Daily Living In your present state of health, do you have any difficulty performing the following activities: 04/10/2021 04/10/2021  Hearing? - -  Vision? - -  Difficulty concentrating or making decisions? Y -  Walking or climbing stairs? - -  Dressing or bathing? - -  Doing errands, shopping? - Facilities manager and eating ? - N  Using the Toilet? - N  In the past six months, have you accidently leaked urine? - N  Do you have problems with loss of bowel control? - N  Managing your Medications? - N  Managing your Finances? - N  Housekeeping or managing your Housekeeping? -  Y  Some recent data might be hidden   Patient Care Team: Gifford Shave, MD as PCP - General (Family Medicine)   Assessment:   This is a routine wellness examination for Jakaleb.  Exercise Activities and Dietary recommendations Current Exercise Habits: Home exercise routine, Time (Minutes): 20, Frequency (Times/Week): 7, Weekly Exercise (Minutes/Week): 140, Intensity: Moderate, Exercise limited by: orthopedic condition(s);neurologic condition(s);respiratory conditions(s)   Goals      Client will have ADL needs addressed by [facility]     Patient Goals/Self-Care Activities: Over the next 5 days Select a new PCS provider  Call [facility] to schedule assessment [phone] or [phone]      Effective Long-Term Care Planning     Patient Goals/Self-Care Activities :  Complete Advance Directive packet,  Call LCSW if you have questions  Have advance directive notarized and provide a copy to provider office       Find Help in My Community     Timeframe:  Short-Term Goal Priority:  High Start Date:   07/18/20                          Expected End Date:                        Call Department of Social Services [phone] to apply for Medicaid Transportation  Call your insurance provider to discuss transportation options Call your insurance provider for more information  about your Enhanced Benefits  Follow up on SCAT application to get assessment scheduled   Why is this important?   Knowing how and where to find help for yourself or family in your neighborhood and community is an important skill.       Quit Smoking     Patient reports uses nicotine patches and reports ~2 cigs per day now.       Fall Risk Fall Risk  04/10/2021 06/01/2019  Falls in the past year? 1 -  Number falls in past yr: 0 1  Injury with Fall? 0 0  Risk for fall due to : Impaired mobility;Impaired balance/gait -  Follow up Falls prevention discussed -   Patient reports impaired mobility. Patient uses a walker to ambulate.  Is the patient's home free of loose throw rugs in walkways, pet beds, electrical cords, etc?   yes      Grab bars in the bathroom? yes      Handrails on the stairs?   yes      Adequate lighting?   yes  Patient rating of health (0-10): 10   Depression Screen PHQ 2/9 Scores 04/10/2021 07/06/2020 06/01/2019  PHQ - 2 Score 2 3 4   PHQ- 9 Score 6 9 -   Cognitive Function 6CIT Screen 04/10/2021  What Year? 0 points  What month? 0 points  What time? 0 points  Count back from 20 0 points  Months in reverse 2 points  Repeat phrase 0 points  Total Score 2   Immunization History  Administered Date(s) Administered   Fluad Quad(high Dose 65+) 02/15/2021   Moderna Sars-Covid-2 Vaccination 11/02/2019   PNEUMOCOCCAL CONJUGATE-20 02/15/2021    Covid-19 vaccine status: Patient reports receiving "all the vaccines." However, the only documentation we have is for #1 Moderna. I checked the [place] database as well. Patient asked to bring his vaccine card to next apt so we can update or move forward with vaccines.    Qualifies for Shingles Vaccine? Yes  Shingrix Completed: No, Education has been provided regarding the importance of this vaccine. Advised may receive this vaccine at local pharmacy or Health Dept. Aware to provide a copy of the vaccination record if obtained from  local pharmacy or Health Dept. Verbalized acceptance and understanding.  Tetanus? No. Prescription has been sent to his pharmacy. Patient has been educated on how to receive this.  Screening Tests Health Maintenance  Topic Date Due   Hepatitis C Screening  Never done   TETANUS/TDAP  Never done   COLONOSCOPY (Pts 45-87yrs Insurance coverage will need to be confirmed)  Never done   Zoster Vaccines- Shingrix (1 of 2) Never done   COVID-19 Vaccine (2 - Moderna series) 11/30/2019   Pneumonia Vaccine 43+ Years old  Completed   INFLUENZA VACCINE  Completed   HPV VACCINES  Aged Out   Cancer Screenings: Lung: Low Dose CT Chest recommended if Age 37-80 years, 30 pack-year currently smoking OR have quit w/in 15years. Patient does not qualify. Colorectal: Never  Additional Screenings: HIV Screening: Completed   Plan:  Follow-up with PCP as needed.  Bring covid card to next apt. Go to [facility] for Tetanus Vaccine. Let us know when you need more nicotine patches.  Keep up the good work with smoking cessation!  I have personally reviewed and noted the following in the patients chart:   Medical and social history Use of alcohol, tobacco or illicit drugs  Current medications and supplements Functional ability and status Nutritional status Physical activity Advanced directives List of other physicians Hospitalizations, surgeries, and ER visits in previous 12 months Vitals Screenings to include cognitive, depression, and falls Referrals and appointments  In addition, I have reviewed and discussed with patient certain preventive protocols, quality metrics, and best practice recommendations. A written personalized care plan for preventive services as well as general preventive health recommendations were provided to patient.  Dorna Bloom, [place]  04/10/2021

## 2021-05-01 ENCOUNTER — Other Ambulatory Visit: Payer: Self-pay

## 2021-05-01 DIAGNOSIS — S88112A Complete traumatic amputation at level between knee and ankle, left lower leg, initial encounter: Secondary | ICD-10-CM

## 2021-05-15 ENCOUNTER — Ambulatory Visit: Payer: Medicare Other | Attending: Orthopedic Surgery

## 2021-05-15 ENCOUNTER — Other Ambulatory Visit: Payer: Self-pay

## 2021-05-15 DIAGNOSIS — S88112A Complete traumatic amputation at level between knee and ankle, left lower leg, initial encounter: Secondary | ICD-10-CM | POA: Insufficient documentation

## 2021-05-15 DIAGNOSIS — M6281 Muscle weakness (generalized): Secondary | ICD-10-CM | POA: Insufficient documentation

## 2021-05-15 DIAGNOSIS — R2689 Other abnormalities of gait and mobility: Secondary | ICD-10-CM | POA: Insufficient documentation

## 2021-05-15 NOTE — Therapy (Signed)
[facility] [address] [place], [place], [address] Phone: [phone]   Fax:  [phone]  Physical Therapy Evaluation  Patient Details  Name: Kristopher Ruiz. MRN: [id number] Date of Birth: 13-Feb-1951 Referring Provider (PT): Dr. Lajoyce Corners   Encounter Date: 05/15/2021   PT End of Session - 05/15/21 1323     Visit Number 1    Number of Visits 24    Date for PT Re-Evaluation 08/07/21    Authorization Type UHC Medicare/medicaid    Progress Note Due on Visit 10    PT Start Time 1215    PT Stop Time 1300    PT Time Calculation (min) 45 min    Equipment Utilized During Treatment Gait belt    Activity Tolerance Patient tolerated treatment well    Behavior During Therapy WFL for tasks assessed/performed             Past Medical History:  Diagnosis Date   Arthritis    Cataract    NS OU   Hypertension    Stroke [facility])     Past Surgical History:  Procedure Laterality Date   HIP FRACTURE SURGERY     LEG AMPUTATION BELOW KNEE     left leg   TOTAL HIP ARTHROPLASTY Left 10/31/2019   TOTAL HIP ARTHROPLASTY Left 10/31/2019   Procedure: LEFT TOTAL HIP ARTHROPLASTY, POSTERIOR;  Surgeon: Tarry Kos, MD;  Location: [facility] OR;  Service: Orthopedics;  Laterality: Left;    There were no vitals filed for this visit.    Subjective Assessment - 05/15/21 1314     Subjective pt can't remember when his amputation or when he received his prosthetic leg. Patient thinks it was >10 years ago. Pt reports he had revision done to his prosthetic leg (pylon) 1 month ago with [employer]. He is here because he wants to work on his walking.    Pertinent History Hx of L BKA, CVA, HTN    How long can you sit comfortably? no issues    How long can you stand comfortably? 2-3 min    How long can you walk comfortably? 10-15 min with RW    Patient Stated Goals Walk better,    Currently in Pain? No/denies                [facility] PT Assessment -  05/15/21 0001       Assessment   Medical Diagnosis R BKA    Referring Provider (PT) Dr. Lajoyce Corners    Onset Date/Surgical Date 05/01/21   Date of referral     Precautions   Precautions Fall      Restrictions   Weight Bearing Restrictions No      Balance Screen   Has the patient fallen in the past 6 months Yes    How many times? 1    Has the patient had a decrease in activity level because of a fear of falling?  No    Is the patient reluctant to leave their home because of a fear of falling?  No      Home Environment   Living Environment Private residence    Available Help at Discharge --   aide comes 2 hours per day with cleaning, cooking   Type of Home House    Home Access Stairs to enter    Entrance Stairs-Number of Steps 3    Entrance Stairs-Rails Right    Home Layout One level    Home Equipment [place] -  2 wheels;Walker - 4 wheels;Shower seat;Grab bars - toilet;Grab bars - tub/shower;Transport chair      Prior Function   Level of Independence Independent      Cognition   Overall Cognitive Status Within Functional Limits for tasks assessed      Transfers   Five time sit to stand comments  36 sec with bil UE support      Standardized Balance Assessment   Standardized Balance Assessment Timed Up and Go Test;10 meter walk test;Five Times Sit to Stand;Berg Balance Test      Berg Balance Test   Sit to Stand Able to stand  independently using hands    Standing Unsupported Unable to stand 30 seconds unassisted    Sitting with Back Unsupported but Feet Supported on Floor or Stool Able to sit safely and securely 2 minutes    Stand to Sit Controls descent by using hands    Transfers Able to transfer safely, definite need of hands    Standing Unsupported with Eyes Closed Needs help to keep from falling    Standing Unsupported with Feet Together Needs help to attain position and unable to hold for 15 seconds    From Standing, Reach Forward with Outstretched Arm Reaches forward but  needs supervision    From Standing Position, Pick up Object from Floor Unable to try/needs assist to keep balance   requires support surface to reach down.   From Standing Position, Turn to Look Behind Over each Shoulder Looks behind one side only/other side shows less weight shift    Turn 360 Degrees Needs assistance while turning    Standing Unsupported, Alternately Place Feet on Step/Stool Needs assistance to keep from falling or unable to try    Standing Unsupported, One Foot in [employer] balance while stepping or standing    Standing on One Leg Unable to try or needs assist to prevent fall    Total Score 17    Berg comment: 17/56      Timed Up and Go Test   Normal TUG (seconds) 55   with RW                       Objective measurements completed on examination: See above findings.                  PT Short Term Goals - 05/15/21 1259       PT SHORT TERM GOAL #1   Title Pt will be able to perform TUG <45 seconds with RW to improve functional mobility    Baseline 55 seconds (05/15/21)    Time 4    Period Weeks    Status New    Target Date 06/12/21      PT SHORT TERM GOAL #2   Title Pt will be able to ambulate 115' with RW with SBA to improve household ambulation    Baseline 10-15 feet with 4 WW at home (05/15/21)    Time 4    Period Weeks    Status New    Target Date 06/12/21      PT SHORT TERM GOAL #3   Title Pt will be able to go up and down steps with 1 rail for UE and SBA to improve safety    Baseline Pt reports unsteadiness (05/15/21)    Time 4    Period Weeks    Status New    Target Date 06/12/21  PT Long Term Goals - 05/15/21 1303       PT LONG TERM GOAL #1   Title Pt will be able to perform 5x sit to stand with bil UE support and without UE support when standing and in <20 seconds to improve functional strength with transfers    Baseline 36 seconds bil UE intermittent support of walker    Time 8    Period  Weeks    Status New    Target Date 08/07/21      PT LONG TERM GOAL #2   Title Patient will demo >27/56 on BBS to improve functional standing balance and reduce fall risk    Baseline 17/56 (2//15/23)    Time 8    Period Weeks    Status New    Target Date 08/07/21      PT LONG TERM GOAL #3   Title Pt will demo >230' with RW and SBA to improve functional ambulation within home    Baseline 10-15 feet with 4 WW within home (05/15/21)    Time 8    Period Weeks    Status New    Target Date 08/07/21      PT LONG TERM GOAL #4   Title Pt will be I and compliant with HEP to self manage symptoms    Baseline TBD    Time 8    Period Weeks    Status New    Target Date 08/07/21                    Plan - 05/15/21 1317     Clinical Impression Statement Patient is a 71 y.o. 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 Pt demonstrates high fall risk due to 17/56 on Berg balance scale and decreased functional strength (5x sit to stand), and decreased overall functional mobility and transfers (Timed up and go test). These impairments are limiting patient from performing trnasfers, gait, stairs, and affecting his safety and indepdence.    Personal Factors and Comorbidities Age;Comorbidity 2    Comorbidities Hx of L BKA, CVA, HTN    Examination-Activity Limitations Caring for Others;Carry;Squat;Stairs;Stand;Transfers    Examination-Participation Restrictions Church;Cleaning;Community Activity;Meal Prep    Stability/Clinical Decision Making Stable/Uncomplicated    Clinical Decision Making Moderate    Rehab Potential Good    PT Frequency 2x / week    PT Duration 12 weeks    PT Treatment/Interventions ADLs/Self Care Home Management;Cryotherapy;Electrical Stimulation;Moist Heat;Gait training;Stair training;Functional mobility training;Therapeutic activities;Therapeutic exercise;Balance training;Manual techniques;Wheelchair mobility training;Prosthetic  Training;Orthotic Fit/Training;Patient/family education;Neuromuscular re-education;Passive range of motion;Energy conservation;Joint Manipulations    PT Next Visit Plan Work on static standing balance, work on Investment banker, operational with walker (pt uses rollator at home- assess his gait with rollator for safety) Pt does have 2 ww at home and encournage pt to use that over 4 ww    Consulted and Agree with Plan of Care Patient             Patient will benefit from skilled therapeutic intervention in order to improve the following deficits and impairments:  Abnormal gait, Decreased activity tolerance, Decreased balance, Decreased coordination, Decreased mobility, Decreased strength, Difficulty walking, Impaired flexibility, Impaired tone, Impaired UE functional use, Improper body mechanics, Postural dysfunction, Prosthetic Dependency  Visit Diagnosis: Other abnormalities of gait and mobility  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HCC)  Muscle weakness (generalized)     Problem List Patient Active Problem List   Diagnosis  Date Noted   Anemia 07/06/2020   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   Closed displaced fracture of left femoral neck with delayed healing 09/28/2019   Establishing care with new doctor, encounter for 06/01/2019   TIA (transient ischemic attack) 09/13/2018   Cerebellar stroke (HCC) 09/06/2018   Hypoglycemia due to insulin 09/06/2018   Tobacco abuse 09/06/2018   Hyperlipidemia 09/06/2018   Essential hypertension 09/06/2018   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HCC) 04/16/2017    Ileana LaddKarmesh N Joleene Burnham, PT 05/15/2021, 1:25 PM  [facility] [address] [place], [place], [address] Phone: [phone]   Fax:  [phone]  Name: Kristopher SeverDavid Mccaughan Jr. MRN: [id number] Date of Birth: 04/11/1950

## 2021-05-21 ENCOUNTER — Other Ambulatory Visit: Payer: Self-pay | Admitting: Family Medicine

## 2021-05-29 ENCOUNTER — Other Ambulatory Visit: Payer: Self-pay | Admitting: Family Medicine

## 2021-06-06 ENCOUNTER — Telehealth: Payer: Self-pay | Admitting: Pharmacist

## 2021-06-06 DIAGNOSIS — Z720 Tobacco use: Secondary | ICD-10-CM

## 2021-06-06 MED ORDER — METOPROLOL SUCCINATE ER 25 MG PO TB24: 25.0000 mg | ORAL_TABLET | Freq: Every day | ORAL | 1 refills | Status: DC

## 2021-06-06 MED ORDER — NICOTINE 7 MG/24HR TD PT24: 7.0000 mg | MEDICATED_PATCH | Freq: Every day | TRANSDERMAL | 1 refills | Status: DC

## 2021-06-06 NOTE — Telephone Encounter (Signed)
Patient contacted for follow/up of tobacco cessation attempt.  ?Since last contact patient reports smoking 1 cigarette per day.  ? ?Medications previously sed; Nicotine patch - 7mg  but ran out and requesting restarting ?  ?Patient denies any significant side effects from tobacco cessation therapy.  ?Rates IMPORTANCE of quitting tobacco as high.  ?Rates CONFIDENCE of quitting tobacco also as high.  States confidence he can completely quit quit last cigarette with restarting nicotine patch.  ? ?Total time with patient call and documentation of interaction: 13 minutes. ?Refilled metoprolol at patient request.  ?F/U Phone call planned: 2 weeks. ? ? ?

## 2021-06-06 NOTE — Assessment & Plan Note (Signed)
Patient contacted for follow/up of tobacco cessation attempt.  ?Since last contact patient reports smoking 1 cigarette per day.  ? ?Medications previously sed; Nicotine patch - 7mg  but ran out and requesting restarting ?  ?Patient denies any significant side effects from tobacco cessation therapy.  ?Rates IMPORTANCE of quitting tobacco as high.  ?Rates CONFIDENCE of quitting tobacco also as high.  States confidence he can completely quit quit last cigarette with restarting nicotine patch.  ?

## 2021-06-06 NOTE — Telephone Encounter (Signed)
-----   Message from Kathrin Ruddy, RPH-CPP sent at 04/05/2021 12:21 PM EST ----- ?Regarding: Quit for 2 weeks - Quit 02/2021 ? ? ?

## 2021-06-07 ENCOUNTER — Ambulatory Visit: Payer: Medicare Other | Attending: Orthopedic Surgery | Admitting: Physical Therapy

## 2021-06-07 ENCOUNTER — Encounter: Payer: Self-pay | Admitting: Physical Therapy

## 2021-06-07 ENCOUNTER — Other Ambulatory Visit: Payer: Self-pay

## 2021-06-07 DIAGNOSIS — R2689 Other abnormalities of gait and mobility: Secondary | ICD-10-CM | POA: Insufficient documentation

## 2021-06-07 DIAGNOSIS — M6281 Muscle weakness (generalized): Secondary | ICD-10-CM | POA: Diagnosis present

## 2021-06-07 NOTE — Therapy (Signed)
?OUTPATIENT PHYSICAL THERAPY TREATMENT NOTE ? ? ?Patient Name: Kristopher Ruiz. ?MRN: [id number] ?DOB:March 20, 1951, 71 y.o., male ?Today's Date: 06/07/2021 ? ?PCP: Gifford Shave, MD ?REFERRING PROVIDER: Dr. Sharol Given  ? ? PT End of Session - 06/07/21 1321   ? ? Visit Number 2   ? Number of Visits 24   ? Date for PT Re-Evaluation 08/07/21   ? Authorization Type UHC Medicare/medicaid   ? Progress Note Due on Visit 10   ? PT Start Time 1319   ? PT Stop Time 1400   ? PT Time Calculation (min) 41 min   ? Equipment Utilized During Treatment Gait belt   ? Activity Tolerance Patient tolerated treatment well   ? Behavior During Therapy [facility] for tasks assessed/performed   ? ?  ?  ? ?  ? ? ?Past Medical History:  ?Diagnosis Date  ? Arthritis   ? Cataract   ? NS OU  ? Hypertension   ? Stroke [facility])   ? ?Past Surgical History:  ?Procedure Laterality Date  ? HIP FRACTURE SURGERY    ? LEG AMPUTATION BELOW KNEE    ? left leg  ? TOTAL HIP ARTHROPLASTY Left 10/31/2019  ? TOTAL HIP ARTHROPLASTY Left 10/31/2019  ? Procedure: LEFT TOTAL HIP ARTHROPLASTY, POSTERIOR;  Surgeon: Leandrew Koyanagi, MD;  Location: [place];  Service: Orthopedics;  Laterality: Left;  ? ?Patient Active Problem List  ? Diagnosis Date Noted  ? Anemia 07/06/2020  ? 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 Closed displaced fracture of left femoral neck with delayed healing 09/28/2019  ? Establishing care with new doctor, encounter for 06/01/2019  ? TIA (transient ischemic attack) 09/13/2018  ? Cerebellar stroke ([place]) 09/06/2018  ? Hypoglycemia due to insulin 09/06/2018  ? Tobacco abuse 09/06/2018  ? Hyperlipidemia 09/06/2018  ? Essential hypertension 09/06/2018  ? 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[place]) 04/16/2017  ? ? ?REFERRING DIAG: R BKA  ? ?THERAPY DIAG:  ?Other abnormalities of gait and mobility ? ?Muscle weakness (generalized) ? ?PERTINENT HISTORY: Hx of L BKA, CVA, HTN  ? ?PRECAUTIONS: Fall  ? ?SUBJECTIVE: No new complaints. No falls or pain to  report.  ? ?PAIN:  ?Are you having pain? No ? ? ? ? ?TODAY'S TREATMENT: ?CURRENT PROSTHETIC WEAR ASSESSMENT: ?Donning prosthesis: Modified independence ?Doffing prosthesis: Modified independence ?Prosthetic wear tolerance: most awake hours with 1-2 hour break hours/day, daily days/week ?Residual limb condition: intact per pt report ? ? ?GAIT: ?Gait pattern: step through pattern, ataxic, lateral hip instability, decreased trunk rotation, trunk flexed, and narrow BOS ?Distance walked: 60 x2  ?Assistive device utilized: Environmental consultant - 2 wheeled and prosthesis ?Level of assistance: Min A and Mod A ?Comments:  pt with significant ataxia in right LE and instability on prosthetic side with 1st gait rep. Added 3# ankle weight to right LE with improved step control noted with 2cd gait rep. Continued to scissor at time with both prosthesis and right LE, decreased from 1st gait rep.   ? ?BALANCE: ?Standing with RW support and 3# ankle weight on right LE: alternating forward stepping to target on floor<>back to parallel stance for 10 reps each side with cues to slow down for improved movement control. Then alternating foot taps to 4 inch box for 10 reps each side. Min to mod assist for balance.  ? ? ? ? ?PATIENT EDUCATION: ?Education details: safety with mobility at home due to ataxia and broken brake on wheelchair. Pt may need new wheelchair if he  qualifies as he is using a transport chair with a broken brake on one side and loose brake on the other side.  ?Person educated: Patient and Child(ren) ?Education method: Explanation, Demonstration, and Verbal cues ?Education comprehension: verbalized understanding, returned demonstration, verbal cues required, and needs further education ? ? ?HOME EXERCISE PROGRAM: ?TBD ? ? PT Short Term Goals - 05/15/21 1259   ? ?  ? PT SHORT TERM GOAL #1  ? Title Pt will be able to perform TUG <45 seconds with RW to improve functional mobility   ? Baseline 55 seconds (05/15/21)   ? Time 4   ? Period  Weeks   ? Status New   ? Target Date 06/12/21   ?  ? PT SHORT TERM GOAL #2  ? Title Pt will be able to ambulate 115' with RW with SBA to improve household ambulation   ? Baseline 10-15 feet with 4 WW at home (05/15/21)   ? Time 4   ? Period Weeks   ? Status New   ? Target Date 06/12/21   ?  ? PT SHORT TERM GOAL #3  ? Title Pt will be able to go up and down steps with 1 rail for UE and SBA to improve safety   ? Baseline Pt reports unsteadiness (05/15/21)   ? Time 4   ? Period Weeks   ? Status New   ? Target Date 06/12/21   ? ?  ?  ? ?  ? ? ? PT Long Term Goals - 05/15/21 1303   ? ?  ? PT LONG TERM GOAL #1  ? Title Pt will be able to perform 5x sit to stand with bil UE support and without UE support when standing and in <20 seconds to improve functional strength with transfers   ? Baseline 36 seconds bil UE intermittent support of walker   ? Time 8   ? Period Weeks   ? Status New   ? Target Date 08/07/21   ?  ? PT LONG TERM GOAL #2  ? Title Patient will demo >27/56 on BBS to improve functional standing balance and reduce fall risk   ? Baseline 17/56 (2//15/23)   ? Time 8   ? Period Weeks   ? Status New   ? Target Date 08/07/21   ?  ? PT LONG TERM GOAL #3  ? Title Pt will demo >230' with RW and SBA to improve functional ambulation within home   ? Baseline 10-15 feet with 4 WW within home (05/15/21)   ? Time 8   ? Period Weeks   ? Status New   ? Target Date 08/07/21   ?  ? PT LONG TERM GOAL #4  ? Title Pt will be I and compliant with HEP to self manage symptoms   ? Baseline TBD   ? Time 8   ? Period Weeks   ? Status New   ? Target Date 08/07/21   ? ?  ?  ? ?  ? ? ? Plan - 06/07/21 1321   ? ? Clinical Impression Statement Skilled session continued to address transfers, gait with RW and standing balance with support. Min to mod assist needed for balance at this time. Pt is walking at home alone, advised of fall risk associated with this. Pt and daughter verbalized understanding. Pt should benefit from continued PT to  progress toward unmet goals.   ? Personal Factors and Comorbidities Age;Comorbidity 2   ? Comorbidities Hx of  L BKA, CVA, HTN   ? Examination-Activity Limitations Caring for Others;Carry;Squat;Stairs;Stand;Transfers   ? Examination-Participation Restrictions Church;Cleaning;Community Activity;Meal Prep   ? Stability/Clinical Decision Making Stable/Uncomplicated   ? Rehab Potential Good   ? PT Frequency 2x / week   ? PT Duration 12 weeks   ? PT Treatment/Interventions ADLs/Self Care Home Management;Cryotherapy;Electrical Stimulation;Moist Heat;Gait training;Stair training;Functional mobility training;Therapeutic activities;Therapeutic exercise;Balance training;Manual techniques;Wheelchair mobility training;Prosthetic Training;Orthotic Fit/Training;Patient/family education;Neuromuscular re-education;Passive range of motion;Energy conservation;Joint Manipulations   ? PT Next Visit Plan formally test LE strength , coordination and sensation and issue HEP afterwards; continue to work on transfers and gait with RW (pt no longer using rollator after advised not to at eval), balance training with decreased UE support   ? Consulted and Agree with Plan of Care Patient   ? ?  ?  ? ?  ? ? ? ?Willow Ora, PTA, CLT ?[facility] ?[address], [address] ?[place], [place] [address] ?[phone] ?06/07/21, 5:09 PM  ? ?   ?

## 2021-06-10 NOTE — Telephone Encounter (Signed)
Noted and agree. 

## 2021-06-11 ENCOUNTER — Ambulatory Visit: Payer: Medicare Other | Admitting: Rehabilitation

## 2021-06-14 ENCOUNTER — Other Ambulatory Visit: Payer: Self-pay

## 2021-06-14 ENCOUNTER — Ambulatory Visit: Payer: Medicare Other

## 2021-06-14 DIAGNOSIS — R2689 Other abnormalities of gait and mobility: Secondary | ICD-10-CM

## 2021-06-14 DIAGNOSIS — M6281 Muscle weakness (generalized): Secondary | ICD-10-CM

## 2021-06-14 NOTE — Therapy (Addendum)
?OUTPATIENT PHYSICAL THERAPY TREATMENT NOTE ? ? ?Patient Name: Kristopher SeverDavid Forton Jr. ?MRN: [id number] ?DOB:12-09-1950, 71 y.o., male ?Today's Date: 06/15/2021 ? ?PCP: Derrel Nipresenzo, Victor, MD ?REFERRING PROVIDER: Dr. Lajoyce Cornersuda  ? ? PT End of Session - 06/14/21 1542   ? ? Visit Number 3   ? Number of Visits 24   ? Date for PT Re-Evaluation 08/07/21   ? Authorization Type UHC Medicare/medicaid   ? Progress Note Due on Visit 10   ? PT Start Time 1542   Pt arrived late  ? PT Stop Time 1613   ? PT Time Calculation (min) 31 min   ? Equipment Utilized During Treatment Gait belt   ? Activity Tolerance Patient tolerated treatment well   ? [facility] tasks assessed/performed   ? ?  ?  ? ?  ? ? ?Past Medical History:  ?Diagnosis Date  ? Arthritis   ? Cataract   ? NS OU  ? Hypertension   ? [facility]   ? ?Past Surgical History:  ?Procedure Laterality Date  ? HIP FRACTURE SURGERY    ? LEG AMPUTATION BELOW KNEE    ? left leg  ? TOTAL HIP ARTHROPLASTY Left 10/31/2019  ? TOTAL HIP ARTHROPLASTY Left 10/31/2019  ? Procedure: LEFT TOTAL HIP ARTHROPLASTY, POSTERIOR;  Surgeon: Tarry KosXu, Naiping M, MD;  Location: [facility];  Service: Orthopedics;  Laterality: Left;  ? ?Patient Active Problem List  ? Diagnosis Date Noted  ? Anemia 07/06/2020  ? 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 Closed displaced fracture of left femoral neck with delayed healing 09/28/2019  ? Establishing care with new doctor, encounter for 06/01/2019  ? TIA (transient ischemic attack) 09/13/2018  ? Cerebellar stroke ([facility]) 09/06/2018  ? Hypoglycemia due to insulin 09/06/2018  ? Tobacco abuse 09/06/2018  ? Hyperlipidemia 09/06/2018  ? Essential hypertension 09/06/2018  ? 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[facility]) 04/16/2017  ? ? ?REFERRING DIAG: R BKA  ? ?THERAPY DIAG:  ?Other abnormalities of gait and mobility ? ?Muscle weakness (generalized) ? ?PERTINENT HISTORY: Hx of L BKA, CVA, HTN  ? ?PRECAUTIONS: Fall  ? ?SUBJECTIVE: Pt reports that he had 2  falls yesterday when tripped on walker. Also only has rollator in home and sits on it. Needs w/c. Only uses transport chair outside and breaks don't work. He reports he was walking on his own as the person at home was elderly and unable to help. Was able to get up on own and denies injury.  Pt reports that he had stroke about 3 years ago. Reports difficulty with shaking on right and sensation problems on left. Went to nursing home for awhile after and had some therapy there. ? ?PAIN:  ?Are you having pain? No ? ? ? ? ?TODAY'S TREATMENT: ?CURRENT PROSTHETIC WEAR ASSESSMENT: ?Donning prosthesis: Modified independence ?Doffing prosthesis: Modified independence ?Prosthetic wear tolerance: most awake hours with 1-2 hour break hours/day, daily days/week ?Residual limb condition: intact per pt report ?Comments: pt wearing 2 ply socks currently. [place] [facility] [address]  ? ? ? ?MMT: ? ?MMT Right ?06/15/2021 Left ?06/15/2021  ?Hip flexion 4+/5 5/5  ?Hip extension    ?Hip abduction    ?Hip adduction    ?Hip internal rotation    ?Hip external rotation    ?Knee flexion 4+/5 5/5  ?Knee extension 5/5 5/5  ?Ankle dorsiflexion 5/5   ?Ankle plantarflexion    ?Ankle inversion    ?Ankle eversion    ?(  Blank rows = not tested)  ? Sensation: intact on right, light touch diminished throughout LUE and LLE. Impaired temperature on left. ? Proprioception: intact at thumb bilateral ? Coordination: impaired right heel to shin, impaired finger to nose on right, impaired RAMs right ? Sit to stand from transport chair positioned against mat as brakes not working well: CGA/min assist. ? Pregait: stepping forwards and back x 3 each foot with some ataxia on right noted. ? Standing without UE support about 20 sec with increased ataxia but able to maintain close SBA. ? Gait: Pt ambulated 115' with RW and prosthesis min assist with verbal cues to focus on control and upright posture. Pt did better with foot placement today pulling it  back only every few steps. Pt has decreased step length right with ataxic step. ?BP=150/86 ? ? ?PATIENT EDUCATION: ?Education details: safety with mobility at home due to ataxia and broken brake on wheelchair. Pt may need new wheelchair if he qualifies as he is using a transport chair with a broken brake on one side and loose brake on the other side. Pt was instructed not to walk on own at home. ?Person educated: Patient ?Education method: Explanation, Demonstration, and Verbal cues ?Education comprehension: verbalized understanding, returned demonstration, verbal cues required, and needs further education ? ? ?HOME EXERCISE PROGRAM: ?TBD ? ? PT Short Term Goals - 05/15/21 1259   ? ?  ? PT SHORT TERM GOAL #1  ? Title Pt will be able to perform TUG <45 seconds with RW to improve functional mobility   ? Baseline 55 seconds (05/15/21)   ? Time 4   ? Period Weeks   ? Status New   ? Target Date 06/12/21   ?  ? PT SHORT TERM GOAL #2  ? Title Pt will be able to ambulate 115' with RW with SBA to improve household ambulation   ? Baseline 10-15 feet with 4 WW at home (05/15/21)   ? Time 4   ? Period Weeks   ? Status New   ? Target Date 06/12/21   ?  ? PT SHORT TERM GOAL #3  ? Title Pt will be able to go up and down steps with 1 rail for UE and SBA to improve safety   ? Baseline Pt reports unsteadiness (05/15/21)   ? Time 4   ? Period Weeks   ? Status New   ? Target Date 06/12/21   ? ?  ?  ? ?  ? ? ? PT Long Term Goals - 05/15/21 1303   ? ?  ? PT LONG TERM GOAL #1  ? Title Pt will be able to perform 5x sit to stand with bil UE support and without UE support when standing and in <20 seconds to improve functional strength with transfers   ? Baseline 36 seconds bil UE intermittent support of walker   ? Time 8   ? Period Weeks   ? Status New   ? Target Date 08/07/21   ?  ? PT LONG TERM GOAL #2  ? Title Patient will demo >27/56 on BBS to improve functional standing balance and reduce fall risk   ? Baseline 17/56 (2//15/23)   ? Time 8    ? Period Weeks   ? Status New   ? Target Date 08/07/21   ?  ? PT LONG TERM GOAL #3  ? Title Pt will demo >230' with RW and SBA to improve functional ambulation within home   ? Baseline  10-15 feet with 4 WW within home (05/15/21)   ? Time 8   ? Period Weeks   ? Status New   ? Target Date 08/07/21   ?  ? PT LONG TERM GOAL #4  ? Title Pt will be I and compliant with HEP to self manage symptoms   ? Baseline TBD   ? Time 8   ? Period Weeks   ? Status New   ? Target Date 08/07/21   ? ?  ?  ? ?  ? ? ? Plan - 06/07/21 1321   ? ? Clinical Impression Statement PT further assessed coordination, strength and sensation today to get a better grasp on deficits from his prior CVA. Pt with impaired coordination on right with ataxic movements and impaired sensation on left. Pt's strength is slightly less on right but not significant difference. Pt did well with cues to slow down and try to engage core more with activities. Will need to focus more on this with gait and balance. Pt did well shifting weight over prosthesis. Deficits seem more related to his CVA.  ? Personal Factors and Comorbidities Age;Comorbidity 2   ? Comorbidities Hx of L BKA, CVA, HTN   ? Examination-Activity Limitations Caring for Others;Carry;Squat;Stairs;Stand;Transfers   ? Examination-Participation Restrictions Church;Cleaning;Community Activity;Meal Prep   ? Stability/Clinical Decision Making Stable/Uncomplicated   ? Rehab Potential Good   ? PT Frequency 2x / week   ? PT Duration 12 weeks   ? PT Treatment/Interventions ADLs/Self Care Home Management;Cryotherapy;Electrical Stimulation;Moist Heat;Gait training;Stair training;Functional mobility training;Therapeutic activities;Therapeutic exercise;Balance training;Manual techniques;Wheelchair mobility training;Prosthetic Training;Orthotic Fit/Training;Patient/family education;Neuromuscular re-education;Passive range of motion;Energy conservation;Joint Manipulations   ? PT Next Visit Plan Check STGs. issue HEP  afterwards; continue to work on transfers and gait with RW. Work on engaging core and slowing down movements for more control due to ataxia. balance training with decreased UE support. I sent a request fo

## 2021-06-15 ENCOUNTER — Telehealth: Payer: Self-pay

## 2021-06-15 NOTE — Telephone Encounter (Signed)
Dr. Lajoyce Corners, ?Kristopher Ruiz is being treated by physical therapy for abnormality of gait after CVA and left BKA.  They will benefit from use of standard w/c in home in order to improve safety with functional mobility.   ?Please submit order in epic or fax to [facility] at [phone]. . ? ? ?Thank you, ?Elmer Bales, PT, DPT, NCS ?  ?

## 2021-06-16 ENCOUNTER — Other Ambulatory Visit: Payer: Self-pay | Admitting: Orthopedic Surgery

## 2021-06-16 DIAGNOSIS — S88112A Complete traumatic amputation at level between knee and ankle, left lower leg, initial encounter: Secondary | ICD-10-CM

## 2021-06-19 ENCOUNTER — Other Ambulatory Visit: Payer: Self-pay

## 2021-06-19 ENCOUNTER — Ambulatory Visit: Payer: Medicare Other | Admitting: Physical Therapy

## 2021-06-19 ENCOUNTER — Encounter: Payer: Self-pay | Admitting: Physical Therapy

## 2021-06-19 DIAGNOSIS — M6281 Muscle weakness (generalized): Secondary | ICD-10-CM

## 2021-06-19 DIAGNOSIS — R2689 Other abnormalities of gait and mobility: Secondary | ICD-10-CM

## 2021-06-19 NOTE — Therapy (Signed)
?OUTPATIENT PHYSICAL THERAPY TREATMENT NOTE ? ? ?Patient Name: Kristopher Ruiz. ?MRN: [id number] ?DOB:1951/01/16, 70 y.o., male ?Today's Date: 06/19/2021 ? ?PCP: Gifford Shave, MD ?REFERRING PROVIDER: Dr. Sharol Given  ? ? PT End of Session - 06/19/21 1321   ? ? Visit Number 4   ? Number of Visits 24   ? Date for PT Re-Evaluation 08/07/21   ? Authorization Type UHC Medicare/medicaid   ? Progress Note Due on Visit 10   ? PT Start Time 1319   ? PT Stop Time 1400   ? PT Time Calculation (min) 41 min   ? Equipment Utilized During Treatment Gait belt   ? Activity Tolerance Patient tolerated treatment well   ? Behavior During Therapy [facility] for tasks assessed/performed   ? ?  ?  ? ?  ? ? ?Past Medical History:  ?Diagnosis Date  ? Arthritis   ? Cataract   ? NS OU  ? Hypertension   ? Stroke [facility])   ? ?Past Surgical History:  ?Procedure Laterality Date  ? HIP FRACTURE SURGERY    ? LEG AMPUTATION BELOW KNEE    ? left leg  ? TOTAL HIP ARTHROPLASTY Left 10/31/2019  ? TOTAL HIP ARTHROPLASTY Left 10/31/2019  ? Procedure: LEFT TOTAL HIP ARTHROPLASTY, POSTERIOR;  Surgeon: Leandrew Koyanagi, MD;  Location: [place];  Service: Orthopedics;  Laterality: Left;  ? ?Patient Active Problem List  ? Diagnosis Date Noted  ? Anemia 07/06/2020  ? 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 Closed displaced fracture of left femoral neck with delayed healing 09/28/2019  ? Establishing care with new doctor, encounter for 06/01/2019  ? TIA (transient ischemic attack) 09/13/2018  ? Cerebellar stroke ([place]) 09/06/2018  ? Hypoglycemia due to insulin 09/06/2018  ? Tobacco abuse 09/06/2018  ? Hyperlipidemia 09/06/2018  ? Essential hypertension 09/06/2018  ? 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[place]) 04/16/2017  ? ? ?REFERRING DIAG: R BKA  ? ?THERAPY DIAG:  ?Other abnormalities of gait and mobility ? ?Muscle weakness (generalized) ? ?PERTINENT HISTORY: Hx of L BKA, CVA, HTN  ? ?PRECAUTIONS: Fall  ? ?SUBJECTIVE: No new falls or pain to report.  ? ?PAIN:   ?Are you having pain? No ? ? ? ? ?TODAY'S TREATMENT: ?CURRENT PROSTHETIC WEAR ASSESSMENT: ?Donning prosthesis: Modified independence ?Doffing prosthesis: Modified independence ?Prosthetic wear tolerance: most awake hours with 1-2 hour break hours/day, daily days/week ?Residual limb condition: intact per pt report ?Comments: pt wearing 2 ply socks currently. Reports leg came from [place] on [facility]. Reports pressure on tibial area. With increase to 3 ply socket is too tight.  ? ?STRENGTHENING  ? Issued HEP for strengthening. Cues on correct form and technique. No issues reported or noted with performance in session. ? ?TRANSFERS:  ?Min guard to min assist for sit<>stands with occasional need of UE support upon standing for balance. Cues for weight shifting to assist with standing and to move slowly for improved balance. Performed for several reps through out session.  ? ? ?GAIT: ?Gait pattern: step through pattern, decreased stride length, and ataxic ?Distance walked: 115 x 1 ?Assistive device utilized: Environmental consultant - 2 wheeled and prosthesis ?Level of assistance:  min guard at times, mostly min assist ?Comments: cues for posture, to go slow and weight over stance leg prior to advancement of other leg.   ? ? ? ? ? [facility] PT Assessment - 06/19/21 1455   ? ?  ? Timed Up and Go Test  ? Normal TUG (seconds)  49.38   sec's with RW. min guard assist for gait, mod assist for turns with total assist to sit safety into chair after 2cd turn.  ? ?  ?  ? ? ?PATIENT EDUCATION: ?Education details: initial HEP for strengthening ?Person educated: Patient ?Education method: Explanation, Demonstration, and Verbal cues ?Education comprehension: verbalized understanding, returned demonstration, verbal cues required, and needs further education ? ? ?HOME EXERCISE PROGRAM: ?Access Code: [email or web address] ?URL: [email or web address] ?Date: 06/19/2021 ?Prepared by: Willow Ora ? ?Exercises ?Supine Bridge with Arms over Chest - 1 x daily - 5 x  weekly - 1 sets - 10 reps ?Hooklying Clamshell with Resistance - 1 x daily - 5 x weekly - 1 sets - 10 reps ?Supine March with Resistance Band - 1 x daily - 5 x weekly - 1 sets - 10 reps ?Sit to Stand with Counter Support - 1 x daily - 5 x weekly - 1 sets - 10 reps ? ? ? PT Short Term Goals - 05/15/21 1259   ? ?  ? PT SHORT TERM GOAL #1  ? Title Pt will be able to perform TUG <45 seconds with RW to improve functional mobility   ? Baseline 06/19/21: 49.38 sec's with min to mod assist for safety with RW/prosthesis  ? Time   ? Period   ? Status Not Met  ? Target Date   ?  ? PT SHORT TERM GOAL #2  ? Title Pt will be able to ambulate 115' with RW with SBA to improve household ambulation   ? Baseline 06/19/21: met distance portion with min guard to min assist for balance  ? Time   ? Period   ? Status Partially Met  ? Target Date    ?  ? PT SHORT TERM GOAL #3  ? Title Pt will be able to go up and down steps with 1 rail for UE and SBA to improve safety   ? Baseline 06/19/21: Deferred due to safety concerns  ? Time   ? Period   ? Status Deferred  ? Target Date   ? ?  ?  ? ?  ? ? ? PT Long Term Goals - 05/15/21 1303   ? ?  ? PT LONG TERM GOAL #1  ? Title Pt will be able to perform 5x sit to stand with bil UE support and without UE support when standing and in <20 seconds to improve functional strength with transfers   ? Baseline 36 seconds bil UE intermittent support of walker   ? Time 8   ? Period Weeks   ? Status New   ? Target Date 08/07/21   ?  ? PT LONG TERM GOAL #2  ? Title Patient will demo >27/56 on BBS to improve functional standing balance and reduce fall risk   ? Baseline 17/56 (2//15/23)   ? Time 8   ? Period Weeks   ? Status New   ? Target Date 08/07/21   ?  ? PT LONG TERM GOAL #3  ? Title Pt will demo >230' with RW and SBA to improve functional ambulation within home   ? Baseline 10-15 feet with 4 WW within home (05/15/21)   ? Time 8   ? Period Weeks   ? Status New   ? Target Date 08/07/21   ?  ? PT LONG TERM GOAL #4   ? Title Pt will be I and compliant with HEP to self manage symptoms   ? Baseline TBD   ?  Time 8   ? Period Weeks   ? Status New   ? Target Date 08/07/21   ? ?  ?  ? ?  ? ? ? Plan - 06/07/21 1321   ? ? Clinical Impression Statement Skilled session focused on progress toward STGs with progress made. Continues to need cues to slow down for safety/decrease ataxia with mobility. Issued HEP for strengthening this session as well with no issues noted or reported in session. The pt is progressing and should benefit from continued PT to progress toward unmet goals.   ? Personal Factors and Comorbidities Age;Comorbidity 2   ? Comorbidities Hx of L BKA, CVA, HTN   ? Examination-Activity Limitations Caring for Others;Carry;Squat;Stairs;Stand;Transfers   ? Examination-Participation Restrictions Church;Cleaning;Community Activity;Meal Prep   ? Stability/Clinical Decision Making Stable/Uncomplicated   ? Rehab Potential Good   ? PT Frequency 2x / week   ? PT Duration 12 weeks   ? PT Treatment/Interventions ADLs/Self Care Home Management;Cryotherapy;Electrical Stimulation;Moist Heat;Gait training;Stair training;Functional mobility training;Therapeutic activities;Therapeutic exercise;Balance training;Manual techniques;Wheelchair mobility training;Prosthetic Training;Orthotic Fit/Training;Patient/family education;Neuromuscular re-education;Passive range of motion;Energy conservation;Joint Manipulations   ? PT Next Visit Plan Continue to work on transfers and gait with RW. Work on engaging core and slowing down movements for more control due to ataxia. balance training with decreased UE support. Try tall kneeling activities  ? Consulted and Agree with Plan of Care Patient   ? ?  ?  ? ?  ? ? ? ?Willow Ora, PTA, CLT ?[place] ?[address], [address] ?[place], [place] [address] ?[phone] ?06/19/21, 3:09 PM  ? ?   ?

## 2021-06-21 ENCOUNTER — Other Ambulatory Visit: Payer: Self-pay

## 2021-06-21 ENCOUNTER — Ambulatory Visit: Payer: Medicare Other | Admitting: Physical Therapy

## 2021-06-21 ENCOUNTER — Encounter: Payer: Self-pay | Admitting: Physical Therapy

## 2021-06-21 DIAGNOSIS — R2689 Other abnormalities of gait and mobility: Secondary | ICD-10-CM | POA: Diagnosis not present

## 2021-06-21 DIAGNOSIS — M6281 Muscle weakness (generalized): Secondary | ICD-10-CM

## 2021-06-21 NOTE — Therapy (Signed)
?OUTPATIENT PHYSICAL THERAPY TREATMENT NOTE ? ? ?Patient Name: Kristopher Ruiz. ?MRN: [id number] ?DOB:Jul 02, 1950, 71 y.o., male ?Today's Date: 06/21/2021 ? ?PCP: Gifford Shave, MD ?REFERRING PROVIDER: Dr. Sharol Given  ? ? PT End of Session - 06/21/21 1235   ? ? Visit Number 5   ? Number of Visits 24   ? Date for PT Re-Evaluation 08/07/21   ? Authorization Type UHC Medicare/medicaid   ? Progress Note Due on Visit 10   ? PT Start Time 1234   ? PT Stop Time 1315   ? PT Time Calculation (min) 41 min   ? Equipment Utilized During Treatment Gait belt   ? Activity Tolerance Patient tolerated treatment well   ? Behavior During Therapy [facility] for tasks assessed/performed   ? ?  ?  ? ?  ? ? ?Past Medical History:  ?Diagnosis Date  ? Arthritis   ? Cataract   ? NS OU  ? Hypertension   ? Stroke [facility])   ? ?Past Surgical History:  ?Procedure Laterality Date  ? HIP FRACTURE SURGERY    ? LEG AMPUTATION BELOW KNEE    ? left leg  ? TOTAL HIP ARTHROPLASTY Left 10/31/2019  ? TOTAL HIP ARTHROPLASTY Left 10/31/2019  ? Procedure: LEFT TOTAL HIP ARTHROPLASTY, POSTERIOR;  Surgeon: Leandrew Koyanagi, MD;  Location: [place];  Service: Orthopedics;  Laterality: Left;  ? ?Patient Active Problem List  ? Diagnosis Date Noted  ? Anemia 07/06/2020  ? 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 Closed displaced fracture of left femoral neck with delayed healing 09/28/2019  ? Establishing care with new doctor, encounter for 06/01/2019  ? TIA (transient ischemic attack) 09/13/2018  ? Cerebellar stroke ([place]) 09/06/2018  ? Hypoglycemia due to insulin 09/06/2018  ? Tobacco abuse 09/06/2018  ? Hyperlipidemia 09/06/2018  ? Essential hypertension 09/06/2018  ? 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 ? ?REFERRING DIAG: R BKA  ? ?THERAPY DIAG:  ?Other abnormalities of gait and mobility ? ?Muscle weakness (generalized) ? ?PERTINENT HISTORY: Hx of L BKA, CVA, HTN  ? ?PRECAUTIONS: Fall  ? ?SUBJECTIVE: No new falls or pain to report. Reports  HEP is going well at home.  ? ?PAIN:  ?Are you having pain? No ? ? ? ? ?TODAY'S TREATMENT: ? 06/21/21 ?CURRENT PROSTHETIC WEAR ASSESSMENT: ?Donning prosthesis: Modified independence ?Doffing prosthesis: Modified independence ?Prosthetic wear tolerance: most awake hours with 1-2 hour break hours/day, daily days/week ?Residual limb condition: intact per pt report ?Comments: pt wearing 2 ply socks currently, has single ply with him for adjustment if needed. Reports leg came from [place] on [facility]. Reports pressure on tibial area.   ? ?STRENGTHENING  ? Scifit UE/LE's level 3.5 x 8 minutes with goal >/= 75 steps per minute for strengthening, reciprocal movements and activity tolerance.  ? ?TRANSFERS:  ?Min guard to min assist for sit<>stands with occasional need of UE support upon standing for balance. Cues for weight shifting to assist with standing and to move slowly for improved balance. Performed for several reps through out session.  ? ? ?GAIT: ?Gait pattern: step through pattern, decreased stride length, and ataxic ?Distance walked: 115 x 1 ?Assistive device utilized: Environmental consultant - 2 wheeled and prosthesis ?Level of assistance:  min guard at times, mostly min assist ?Comments: continues to need cues for slow and controlled movements with gait, to focus on step placement with right<left foot. Pt with improved stability and balance when moving slower.  ? ?BALANCE/NMR: ? Seated  on green air disc with feet on floor: alternating UE raises, then alternating upper trunk rotation to reach back behind him for 5-6 reps each/each side. PTA assist with keeping LE's on floor to assist with ?balance/stability with cues for slow movements. Then with bil UE support on mat at sides- alternating LE marching, then long arc quads for 10 reps each/each side with min guard to min assist at trunk for posture and balance, cues for slow movements. Progressed to alternating UE/LE raises for 10 reps each side with min assist for balance and cues  to task/coordination of extremities. Then with feet back on floor, UE's holding 3# weight ball for OH raises, then alternating diagonals for ~10 reps each. ? ? ? ? ? ? ? ?PATIENT EDUCATION: ?Education details: continue with HEP for strengthening ?Person educated: Patient ?Education method: Explanation, Demonstration, and Verbal cues ?Education comprehension: verbalized understanding, returned demonstration, verbal cues required, and needs further education ? ? ?HOME EXERCISE PROGRAM: ?Access Code: [email or web address] ?URL: [email or web address] ?Date: 06/19/2021 ?Prepared by: Willow Ora ? ?Exercises ?Supine Bridge with Arms over Chest - 1 x daily - 5 x weekly - 1 sets - 10 reps ?Hooklying Clamshell with Resistance - 1 x daily - 5 x weekly - 1 sets - 10 reps ?Supine March with Resistance Band - 1 x daily - 5 x weekly - 1 sets - 10 reps ?Sit to Stand with Counter Support - 1 x daily - 5 x weekly - 1 sets - 10 reps ? ? ? PT Short Term Goals - 05/15/21 1259   ? ?  ? PT SHORT TERM GOAL #1  ? Title Pt will be able to perform TUG <45 seconds with RW to improve functional mobility   ? Baseline 06/19/21: 49.38 sec's with min to mod assist for safety with RW/prosthesis  ? Time   ? Period   ? Status Not Met  ? Target Date   ?  ? PT SHORT TERM GOAL #2  ? Title Pt will be able to ambulate 115' with RW with SBA to improve household ambulation   ? Baseline 06/19/21: met distance portion with min guard to min assist for balance  ? Time   ? Period   ? Status Partially Met  ? Target Date    ?  ? PT SHORT TERM GOAL #3  ? Title Pt will be able to go up and down steps with 1 rail for UE and SBA to improve safety   ? Baseline 06/19/21: Deferred due to safety concerns  ? Time   ? Period   ? Status Deferred  ? Target Date   ? ?  ?  ? ?  ? ? ? PT Long Term Goals - 05/15/21 1303   ? ?  ? PT LONG TERM GOAL #1  ? Title Pt will be able to perform 5x sit to stand with bil UE support and without UE support when standing and in <20 seconds to  improve functional strength with transfers   ? Baseline 36 seconds bil UE intermittent support of walker   ? Time 8   ? Period Weeks   ? Status New   ? Target Date 08/07/21   ?  ? PT LONG TERM GOAL #2  ? Title Patient will demo >27/56 on BBS to improve functional standing balance and reduce fall risk   ? Baseline 17/56 (2//15/23)   ? Time 8   ? Period Weeks   ? Status New   ? Target  Date 08/07/21   ?  ? PT LONG TERM GOAL #3  ? Title Pt will demo >230' with RW and SBA to improve functional ambulation within home   ? Baseline 10-15 feet with 4 WW within home (05/15/21)   ? Time 8   ? Period Weeks   ? Status New   ? Target Date 08/07/21   ?  ? PT LONG TERM GOAL #4  ? Title Pt will be I and compliant with HEP to self manage symptoms   ? Baseline TBD   ? Time 8   ? Period Weeks   ? Status New   ? Target Date 08/07/21   ? ?  ?  ? ?  ? ? ? Plan - 06/07/21 1321   ? ? Clinical Impression Statement Skilled session focused on strengthening, gait and balance/coordination with up to min assist needed. Pt does better when he slows down with tasks/mobility. The pt is progressing toward goals and should benefit from continued PT to progress toward unmet goals.   ? Personal Factors and Comorbidities Age;Comorbidity 2   ? Comorbidities Hx of L BKA, CVA, HTN   ? Examination-Activity Limitations Caring for Others;Carry;Squat;Stairs;Stand;Transfers   ? Examination-Participation Restrictions Church;Cleaning;Community Activity;Meal Prep   ? Stability/Clinical Decision Making Stable/Uncomplicated   ? Rehab Potential Good   ? PT Frequency 2x / week   ? PT Duration 12 weeks   ? PT Treatment/Interventions ADLs/Self Care Home Management;Cryotherapy;Electrical Stimulation;Moist Heat;Gait training;Stair training;Functional mobility training;Therapeutic activities;Therapeutic exercise;Balance training;Manual techniques;Wheelchair mobility training;Prosthetic Training;Orthotic Fit/Training;Patient/family education;Neuromuscular re-education;Passive  range of motion;Energy conservation;Joint Manipulations   ? PT Next Visit Plan Continue to work on transfers and gait with RW. Work on engaging core and slowing down movements for more control due to atax

## 2021-06-25 ENCOUNTER — Encounter: Payer: Self-pay | Admitting: Physical Therapy

## 2021-06-25 ENCOUNTER — Other Ambulatory Visit: Payer: Self-pay

## 2021-06-25 ENCOUNTER — Telehealth: Payer: Self-pay | Admitting: Pharmacist

## 2021-06-25 ENCOUNTER — Ambulatory Visit: Payer: Medicare Other | Admitting: Physical Therapy

## 2021-06-25 DIAGNOSIS — R2689 Other abnormalities of gait and mobility: Secondary | ICD-10-CM

## 2021-06-25 DIAGNOSIS — M6281 Muscle weakness (generalized): Secondary | ICD-10-CM

## 2021-06-25 NOTE — Telephone Encounter (Signed)
Patient contacted for follow/up of tobacco intake reduction /cessation attempt.  ? ?Since last contact patient reports smoking ~ 1 per day. ? ?Medications currently being used; None  ?Nicotine patch - Continued to have Psychologic triggers so decided to stop patches.  ? ?Rates IMPORTANCE of quitting tobacco rated as high. ?Rates CONFIDENCE of quitting tobacco is moderate. ? ?Most common triggers to use tobacco include; other smokers, smell of smoke.  ? ?Motivation to quit: breathing and health.  ? ?Total time with patient call and documentation of interaction: 16 minutes. ? ?F/U Phone call planned: 2-3 weeks.  ?  ?

## 2021-06-25 NOTE — Therapy (Signed)
?OUTPATIENT PHYSICAL THERAPY TREATMENT NOTE ? ? ?Patient Name: Kristopher Ruiz. ?MRN: [id number] ?DOB:1950-05-03, 71 y.o., male ?Today's Date: 06/26/2021 ? ?PCP: Gifford Shave, MD ?REFERRING PROVIDER: Dr. Sharol Given  ? ? PT End of Session - 06/25/21 1320   ? ? Visit Number 6   ? Number of Visits 24   ? Date for PT Re-Evaluation 08/07/21   ? Authorization Type UHC Medicare/medicaid   ? Progress Note Due on Visit 10   ? PT Start Time 1318   ? PT Stop Time 1400   ? PT Time Calculation (min) 42 min   ? Equipment Utilized During Treatment Gait belt   ? Activity Tolerance Patient tolerated treatment well   ? Behavior During Therapy [facility] for tasks assessed/performed   ? ?  ?  ? ?  ? ? ?Past Medical History:  ?Diagnosis Date  ? Arthritis   ? Cataract   ? NS OU  ? Hypertension   ? Stroke [facility])   ? ?Past Surgical History:  ?Procedure Laterality Date  ? HIP FRACTURE SURGERY    ? LEG AMPUTATION BELOW KNEE    ? left leg  ? TOTAL HIP ARTHROPLASTY Left 10/31/2019  ? TOTAL HIP ARTHROPLASTY Left 10/31/2019  ? Procedure: LEFT TOTAL HIP ARTHROPLASTY, POSTERIOR;  Surgeon: Leandrew Koyanagi, MD;  Location: [place];  Service: Orthopedics;  Laterality: Left;  ? ?Patient Active Problem List  ? Diagnosis Date Noted  ? Anemia 07/06/2020  ? 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 Closed displaced fracture of left femoral neck with delayed healing 09/28/2019  ? Establishing care with new doctor, encounter for 06/01/2019  ? TIA (transient ischemic attack) 09/13/2018  ? Cerebellar stroke ([place]) 09/06/2018  ? Hypoglycemia due to insulin 09/06/2018  ? Tobacco abuse 09/06/2018  ? Hyperlipidemia 09/06/2018  ? Essential hypertension 09/06/2018  ? 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[place]) 04/16/2017  ? ? ?REFERRING DIAG: R BKA  ? ?THERAPY DIAG:  ?Other abnormalities of gait and mobility ? ?Muscle weakness (generalized) ? ?PERTINENT HISTORY: Hx of L BKA, CVA, HTN  ? ?PRECAUTIONS: Fall  ? ?SUBJECTIVE: No new falls or pain to report. Reports  HEP is going well at home.  ? ?PAIN:  ?Are you having pain? No ? ? ? ? ?TODAY'S TREATMENT: ? 06/25/21 ?CURRENT PROSTHETIC WEAR ASSESSMENT: ?Donning prosthesis: Modified independence ?Doffing prosthesis: Modified independence ?Prosthetic wear tolerance: most awake hours with 1-2 hour break hours/day, daily days/week ?Residual limb condition: intact per pt report ?Comments: no change. Still gets occasional pressure on tibial area.  ? ?STRENGTHENING  ? Scifit UE/LE's level 3.8 x 8 minutes with goal >/= 75 steps per minute for strengthening, reciprocal movements and activity tolerance.  ? Step up with 6 inch box and bil LE support on bars for 10 reps each side, min guard assist for safety.  ? ?TRANSFERS:  ?Min guard to min assist for sit<>stands with occasional need of UE support upon standing for balance. Cues for weight shifting to assist with standing and to move slowly for improved balance. Performed for several reps through out session.  ? ?GAIT: ?Gait pattern: step through pattern, decreased stride length, and ataxic ?Distance walked: 80 x 2 reps ?Assistive device utilized: Environmental consultant - 2 wheeled and prosthesis ?Level of assistance:  min guard at times, mostly min assist ?Comments: reminder cues to move slowly for improved step placement/LE control with movements ? ?BALANCE:  ?Side Stepping: with UE support on bars for 4 laps toward each way with  min guard assist for safety. Cues on posture,form and to ensure he lift feet, not slides them along. ?Backwards Walking: with UE support on bars for 4 laps with min guard assist, cues for posture, step length and to advance hands on rails.  ?Marching: with UE support for forward direction for 4 laps . With cues on form/technique with min guard to min assist for balance.  ? ?Static Balance: ?Surface: Airex ?Position: Feet Hip Width Apart ?Completed with: Eyes Open and Eyes Closed; EC for 30 sec's x 3 reps, then EO for Head Turns x 10 Reps and Head Nods x 10 Reps. Min guard to  min assist for balance.  ? ?Modified Tandem Stance: ? Surface: Airex ?Completed with: Eyes Open;  ? Time: 30 seconds x 2 reps each foot forward. Min guard to min assist for balance.  ?  ? ? ? ?PATIENT EDUCATION: ?Education details: continue with HEP for strengthening ?Person educated: Patient ?Education method: Explanation, Demonstration, and Verbal cues ?Education comprehension: verbalized understanding, returned demonstration, verbal cues required, and needs further education ? ? ?HOME EXERCISE PROGRAM: ?Access Code: [email or web address] ?URL: [email or web address] ?Date: 06/19/2021 ?Prepared by: Willow Ora ? ?Exercises ?Supine Bridge with Arms over Chest - 1 x daily - 5 x weekly - 1 sets - 10 reps ?Hooklying Clamshell with Resistance - 1 x daily - 5 x weekly - 1 sets - 10 reps ?Supine March with Resistance Band - 1 x daily - 5 x weekly - 1 sets - 10 reps ?Sit to Stand with Counter Support - 1 x daily - 5 x weekly - 1 sets - 10 reps ? ? ? PT Short Term Goals - 05/15/21 1259   ? ?  ? PT SHORT TERM GOAL #1  ? Title Pt will be able to perform TUG <45 seconds with RW to improve functional mobility   ? Baseline 06/19/21: 49.38 sec's with min to mod assist for safety with RW/prosthesis  ? Time   ? Period   ? Status Not Met  ? Target Date   ?  ? PT SHORT TERM GOAL #2  ? Title Pt will be able to ambulate 115' with RW with SBA to improve household ambulation   ? Baseline 06/19/21: met distance portion with min guard to min assist for balance  ? Time   ? Period   ? Status Partially Met  ? Target Date    ?  ? PT SHORT TERM GOAL #3  ? Title Pt will be able to go up and down steps with 1 rail for UE and SBA to improve safety   ? Baseline 06/19/21: Deferred due to safety concerns  ? Time   ? Period   ? Status Deferred  ? Target Date   ? ?  ?  ? ?  ? ? ? PT Long Term Goals - 05/15/21 1303   ? ?  ? PT LONG TERM GOAL #1  ? Title Pt will be able to perform 5x sit to stand with bil UE support and without UE support when standing and  in <20 seconds to improve functional strength with transfers   ? Baseline 36 seconds bil UE intermittent support of walker   ? Time 8   ? Period Weeks   ? Status New   ? Target Date 08/07/21   ?  ? PT LONG TERM GOAL #2  ? Title Patient will demo >27/56 on BBS to improve functional standing balance and reduce fall risk   ? Baseline  17/56 (2//15/23)   ? Time 8   ? Period Weeks   ? Status New   ? Target Date 08/07/21   ?  ? PT LONG TERM GOAL #3  ? Title Pt will demo >230' with RW and SBA to improve functional ambulation within home   ? Baseline 10-15 feet with 4 WW within home (05/15/21)   ? Time 8   ? Period Weeks   ? Status New   ? Target Date 08/07/21   ?  ? PT LONG TERM GOAL #4  ? Title Pt will be I and compliant with HEP to self manage symptoms   ? Baseline TBD   ? Time 8   ? Period Weeks   ? Status New   ? Target Date 08/07/21   ? ?  ?  ? ?  ? ? ? Plan - 06/07/21 1321   ? ? Clinical Impression Statement Skilled session continued to focus on strengthening, gait training with RW and balance training. Pt continues with ataxic movements that he has more control over when slowed down with mobility. The pt is progressing and should benefit from continued PT to progress toward unmet goals.  ? Personal Factors and Comorbidities Age;Comorbidity 2   ? Comorbidities Hx of L BKA, CVA, HTN   ? Examination-Activity Limitations Caring for Others;Carry;Squat;Stairs;Stand;Transfers   ? Examination-Participation Restrictions Church;Cleaning;Community Activity;Meal Prep   ? Stability/Clinical Decision Making Stable/Uncomplicated   ? Rehab Potential Good   ? PT Frequency 2x / week   ? PT Duration 12 weeks   ? PT Treatment/Interventions ADLs/Self Care Home Management;Cryotherapy;Electrical Stimulation;Moist Heat;Gait training;Stair training;Functional mobility training;Therapeutic activities;Therapeutic exercise;Balance training;Manual techniques;Wheelchair mobility training;Prosthetic Training;Orthotic Fit/Training;Patient/family  education;Neuromuscular re-education;Passive range of motion;Energy conservation;Joint Manipulations   ? PT Next Visit Plan Needs 30 day goals set/or LTGs checked around 07/08/21 for 30 day progress note for

## 2021-06-25 NOTE — Telephone Encounter (Signed)
-----   Message from Kathrin Ruddy, RPH-CPP sent at 06/06/2021  5:01 PM EST ----- ?Regarding: restarted 7mg  patch - smoking 1 cig per day to call ? ? ?

## 2021-06-25 NOTE — Telephone Encounter (Signed)
Noted and agree. 

## 2021-06-28 ENCOUNTER — Ambulatory Visit: Payer: Medicare Other

## 2021-07-02 ENCOUNTER — Encounter: Payer: Self-pay | Admitting: Physical Therapy

## 2021-07-02 ENCOUNTER — Ambulatory Visit: Payer: Medicare Other | Attending: Orthopedic Surgery | Admitting: Physical Therapy

## 2021-07-02 DIAGNOSIS — R2689 Other abnormalities of gait and mobility: Secondary | ICD-10-CM | POA: Diagnosis present

## 2021-07-02 DIAGNOSIS — S88112A Complete traumatic amputation at level between knee and ankle, left lower leg, initial encounter: Secondary | ICD-10-CM | POA: Diagnosis present

## 2021-07-02 DIAGNOSIS — M6281 Muscle weakness (generalized): Secondary | ICD-10-CM | POA: Diagnosis present

## 2021-07-02 NOTE — Therapy (Signed)
?OUTPATIENT PHYSICAL THERAPY TREATMENT NOTE ? ? ?Patient Name: Kristopher Ruiz. ?MRN: [id number] ?DOB:[birth year], 71 y.o., male ?Today's Date: 07/02/2021 ? ?PCP: Gifford Shave, MD ?REFERRING PROVIDER: Dr. Sharol Given  ? ? PT End of Session - 07/02/21 1406   ? ? Visit Number 7   ? Number of Visits 24   ? Date for PT Re-Evaluation 08/07/21   ? Authorization Type UHC Medicare/medicaid   ? Progress Note Due on Visit 10   ? PT Start Time 2774   ? PT Stop Time 1287   ? PT Time Calculation (min) 41 min   ? Equipment Utilized During Treatment Gait belt   ? Activity Tolerance Patient tolerated treatment well   ? Behavior During Therapy [facility] for tasks assessed/performed   ? ?  ?  ? ?  ? ? ?Past Medical History:  ?Diagnosis Date  ? Arthritis   ? Cataract   ? NS OU  ? Hypertension   ? Stroke [facility])   ? ?Past Surgical History:  ?Procedure Laterality Date  ? HIP FRACTURE SURGERY    ? LEG AMPUTATION BELOW KNEE    ? left leg  ? TOTAL HIP ARTHROPLASTY Left 10/31/2019  ? TOTAL HIP ARTHROPLASTY Left 10/31/2019  ? Procedure: LEFT TOTAL HIP ARTHROPLASTY, POSTERIOR;  Surgeon: Leandrew Koyanagi, MD;  Location: [place];  Service: Orthopedics;  Laterality: Left;  ? ?Patient Active Problem List  ? Diagnosis Date Noted  ? Anemia 07/06/2020  ? 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 Closed displaced fracture of left femoral neck with delayed healing 09/28/2019  ? Establishing care with new doctor, encounter for 06/01/2019  ? TIA (transient ischemic attack) 09/13/2018  ? Cerebellar stroke ([place]) 09/06/2018  ? Hypoglycemia due to insulin 09/06/2018  ? Tobacco abuse 09/06/2018  ? Hyperlipidemia 09/06/2018  ? Essential hypertension 09/06/2018  ? 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 ? ?REFERRING DIAG: R BKA  ? ?THERAPY DIAG:  ?Other abnormalities of gait and mobility ? ?Muscle weakness (generalized) ? ?PERTINENT HISTORY: Hx of L BKA, CVA, HTN  ? ?PRECAUTIONS: Fall  ? ?SUBJECTIVE: No new falls or pain to report. Had to  cancel this coming Friday due to no transportation.  ? ?PAIN:  ?Are you having pain? No ? ? ? ? ?TODAY'S TREATMENT: ? 07/02/21 ?CURRENT PROSTHETIC WEAR ASSESSMENT: ?Donning prosthesis: Modified independence ?Doffing prosthesis: Modified independence ?Prosthetic wear tolerance: most awake hours with 1-2 hour break hours/day, daily days/week ?Residual limb condition: intact per pt report ?Comments: no change. Still gets occasional pressure on tibial area.  ? ?STRENGTHENING  ? Nustep UE/LE's level 4.0 x 8 minutes with goal >/= 75 steps per minute for strengthening, reciprocal movements and activity tolerance.  ? Sit<>stands x 10 reps with light support from low mat table. Occasional need to touch sturdy surface for balance upon standing, min guard to min assist.  ? ?TRANSFERS:  ?Min guard to min assist for sit<>stands with occasional need of UE support upon standing for balance. Cues for weight shifting to assist with standing and to move slowly for improved balance. Performed for several reps through out session.  ? ?GAIT: ?Gait pattern: step through pattern, decreased stride length, and ataxic ?Distance walked: 50 x 2 reps ?Assistive device utilized: Environmental consultant - 2 wheeled and prosthesis ?Level of assistance:  min guard at times, mostly min assist ?Comments: reminder cues to move slowly for improved step placement/LE control with movements.  ? ? NMR/BALANCE: ? Seated at edge of mat:  sit<>stands with 1 inch foam under right LE x 5 reps, then under prosthesis x 5 reps. Min assist with UE support needed with foam under prosthesis>right LE.  ?Standing with one hand on sturdy surface/HHA other side: forward/backward stepping over yard stick to color circle targets for 10 reps each side, then lateral foot taps over yard stick/back for 10 reps each side. Min to mod assist for balance.  ? ?  ? ? ? ?PATIENT EDUCATION: ?Education details: continue with HEP for strengthening ?Person educated: Patient ?Education method: Explanation,  Demonstration, and Verbal cues ?Education comprehension: verbalized understanding, returned demonstration, verbal cues required, and needs further education ? ? ?HOME EXERCISE PROGRAM: ?Access Code: [email or web address] ?URL: [email or web address] ?Date: 06/19/2021 ?Prepared by: Willow Ora ? ?Exercises ?Supine Bridge with Arms over Chest - 1 x daily - 5 x weekly - 1 sets - 10 reps ?Hooklying Clamshell with Resistance - 1 x daily - 5 x weekly - 1 sets - 10 reps ?Supine March with Resistance Band - 1 x daily - 5 x weekly - 1 sets - 10 reps ?Sit to Stand with Counter Support - 1 x daily - 5 x weekly - 1 sets - 10 reps ? ? ? PT Short Term Goals - 05/15/21 1259   ? ?  ? PT SHORT TERM GOAL #1  ? Title Pt will be able to perform TUG <45 seconds with RW to improve functional mobility   ? Baseline 06/19/21: 49.38 sec's with min to mod assist for safety with RW/prosthesis  ? Time   ? Period   ? Status Not Met  ? Target Date   ?  ? PT SHORT TERM GOAL #2  ? Title Pt will be able to ambulate 115' with RW with SBA to improve household ambulation   ? Baseline 06/19/21: met distance portion with min guard to min assist for balance  ? Time   ? Period   ? Status Partially Met  ? Target Date    ?  ? PT SHORT TERM GOAL #3  ? Title Pt will be able to go up and down steps with 1 rail for UE and SBA to improve safety   ? Baseline 06/19/21: Deferred due to safety concerns  ? Time   ? Period   ? Status Deferred  ? Target Date   ? ?  ?  ? ?  ? ? ? PT Long Term Goals - 05/15/21 1303   ? ?  ? PT LONG TERM GOAL #1  ? Title Pt will be able to perform 5x sit to stand with bil UE support and without UE support when standing and in <20 seconds to improve functional strength with transfers   ? Baseline 36 seconds bil UE intermittent support of walker   ? Time 8   ? Period Weeks   ? Status New   ? Target Date 08/07/21   ?  ? PT LONG TERM GOAL #2  ? Title Patient will demo >27/56 on BBS to improve functional standing balance and reduce fall risk   ?  Baseline 17/56 (2//15/23)   ? Time 8   ? Period Weeks   ? Status New   ? Target Date 08/07/21   ?  ? PT LONG TERM GOAL #3  ? Title Pt will demo >230' with RW and SBA to improve functional ambulation within home   ? Baseline 10-15 feet with 4 WW within home (05/15/21)   ? Time 8   ? Period Weeks   ?  Status New   ? Target Date 08/07/21   ?  ? PT LONG TERM GOAL #4  ? Title Pt will be I and compliant with HEP to self manage symptoms   ? Baseline TBD   ? Time 8   ? Period Weeks   ? Status New   ? Target Date 08/07/21   ? ?  ?  ? ?  ? ? ? Plan - 06/07/21 1321   ? ? Clinical Impression Statement Skilled session continued to focus on strengthening, balance and coordination with transfers and gait. Pt continues to have increased control over right spasticity when he moves slowly. The pt is progressing and should benefit from continued PT to progress toward unmet goals.   ? Personal Factors and Comorbidities Age;Comorbidity 2   ? Comorbidities Hx of L BKA, CVA, HTN   ? Examination-Activity Limitations Caring for Others;Carry;Squat;Stairs;Stand;Transfers   ? Examination-Participation Restrictions Church;Cleaning;Community Activity;Meal Prep   ? Stability/Clinical Decision Making Stable/Uncomplicated   ? Rehab Potential Good   ? PT Frequency 2x / week   ? PT Duration 12 weeks   ? PT Treatment/Interventions ADLs/Self Care Home Management;Cryotherapy;Electrical Stimulation;Moist Heat;Gait training;Stair training;Functional mobility training;Therapeutic activities;Therapeutic exercise;Balance training;Manual techniques;Wheelchair mobility training;Prosthetic Training;Orthotic Fit/Training;Patient/family education;Neuromuscular re-education;Passive range of motion;Energy conservation;Joint Manipulations   ? PT Next Visit Plan Check LTGs around 07/08/21 for 30 day progress note for Medicare. Continue to work on transfers and gait with RW. Work on engaging core and slowing down movements for more control due to ataxia. balance training  with decreased UE support. Try tall kneeling activities, seated on balance disc, ? Resisted gait/stepping in parallel bars  ? Consulted and Agree with Plan of Care Patient   ? ?  ?  ? ?  ? ? ? ?Willow Ora,

## 2021-07-05 ENCOUNTER — Ambulatory Visit: Payer: Medicare Other

## 2021-07-12 ENCOUNTER — Ambulatory Visit: Payer: Medicare Other

## 2021-07-12 DIAGNOSIS — R2689 Other abnormalities of gait and mobility: Secondary | ICD-10-CM | POA: Diagnosis not present

## 2021-07-12 DIAGNOSIS — M6281 Muscle weakness (generalized): Secondary | ICD-10-CM

## 2021-07-12 DIAGNOSIS — S88112A Complete traumatic amputation at level between knee and ankle, left lower leg, initial encounter: Secondary | ICD-10-CM

## 2021-07-12 NOTE — Therapy (Signed)
?OUTPATIENT PHYSICAL THERAPY PROGRESS NOTE/RECERTIFICATION NOTE ? ? ?Patient Name: Kristopher Ruiz. ?MRN: [id number] ?DOB:October 09, 1950, 71 y.o., male ?Today's Date: 07/12/2021 ? ?PCP: Gifford Shave, MD ?REFERRING PROVIDER: Dr. Sharol Given  ? ? PT End of Session - 07/12/21 1225   ? ? Visit Number 8   ? Number of Visits 24   ? Date for PT Re-Evaluation 09/06/21   ? Authorization Type UHC Medicare/medicaid; EVAL 1/96/22; recert on 2/97/98   ? Progress Note Due on Visit 18   ? PT Start Time 1230   ? PT Stop Time 1315   ? PT Time Calculation (min) 45 min   ? Equipment Utilized During Treatment Gait belt   ? Activity Tolerance Patient tolerated treatment well   ? Behavior During Therapy [facility] for tasks assessed/performed   ? ?  ?  ? ?  ? ? ?Past Medical History:  ?Diagnosis Date  ? Arthritis   ? Cataract   ? NS OU  ? Hypertension   ? Stroke [facility])   ? ?Past Surgical History:  ?Procedure Laterality Date  ? HIP FRACTURE SURGERY    ? LEG AMPUTATION BELOW KNEE    ? left leg  ? TOTAL HIP ARTHROPLASTY Left 10/31/2019  ? TOTAL HIP ARTHROPLASTY Left 10/31/2019  ? Procedure: LEFT TOTAL HIP ARTHROPLASTY, POSTERIOR;  Surgeon: Leandrew Koyanagi, MD;  Location: [place];  Service: Orthopedics;  Laterality: Left;  ? ?Patient Active Problem List  ? Diagnosis Date Noted  ? Anemia 07/06/2020  ? 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 Closed displaced fracture of left femoral neck with delayed healing 09/28/2019  ? Establishing care with new doctor, encounter for 06/01/2019  ? TIA (transient ischemic attack) 09/13/2018  ? Cerebellar stroke ([place]) 09/06/2018  ? Hypoglycemia due to insulin 09/06/2018  ? Tobacco abuse 09/06/2018  ? Hyperlipidemia 09/06/2018  ? Essential hypertension 09/06/2018  ? 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 ? ?REFERRING DIAG: R BKA  ? ?THERAPY DIAG:  ?Other abnormalities of gait and mobility ? ?Muscle weakness (generalized) ? ?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[place]) ? ?PERTINENT  HISTORY: Hx of L BKA, CVA, HTN  ? ?PRECAUTIONS: Fall  ? ?SUBJECTIVE: No new falls or pain to report. Had to cancel this coming Friday due to no transportation.  ? ?PAIN:  ?Are you having pain? No ? ? ? ?OBJECTIVE: ? ?TUG: 46 sec with RW and min to mod A (07/12/21) ?BBS: 26/56 ? ? ?TODAY'S TREATMENT: ?07/12/20: ?Tug performed: trial and an actual timed one  ?Stairs: bil hand rail reciprocal gait pattern:  ?Gait training: 1 x 115' with min A ataxic gait pattern, inconsistent step length, wide BOS, cues to look down at his feet to work on equal step length ?BBS performed ?Standing small step fwd and lateral: without HHA: 10x R and L, required  mod A bil, tactile cues to shift weight on contralateral side before advancing LE ?Gait training: parallel bars: ?- Walking fwd and bwd with 1 HHA: ? R UE: 2 x 8 fwd and bwd ? L UE: 2 x 8 fwd and bwd ?- walking laterally with bil HHA: 2 x 8 feet R and L ? ? ? ? ?07/02/21 ?CURRENT PROSTHETIC WEAR ASSESSMENT: ?Donning prosthesis: Modified independence ?Doffing prosthesis: Modified independence ?Prosthetic wear tolerance: most awake hours with 1-2 hour break hours/day, daily days/week ?Residual limb condition: intact per pt report ?Comments: no change. Still gets occasional pressure on tibial area.  ? ?STRENGTHENING  ? Nustep  UE/LE's level 4.0 x 8 minutes with goal >/= 75 steps per minute for strengthening, reciprocal movements and activity tolerance.  ? Sit<>stands x 10 reps with light support from low mat table. Occasional need to touch sturdy surface for balance upon standing, min guard to min assist.  ? ?TRANSFERS:  ?Min guard to min assist for sit<>stands with occasional need of UE support upon standing for balance. Cues for weight shifting to assist with standing and to move slowly for improved balance. Performed for several reps through out session.  ? ?GAIT: ?Gait pattern: step through pattern, decreased stride length, and ataxic ?Distance walked: 50 x 2 reps ?Assistive device  utilized: Environmental consultant - 2 wheeled and prosthesis ?Level of assistance:  min guard at times, mostly min assist ?Comments: reminder cues to move slowly for improved step placement/LE control with movements.  ? ? NMR/BALANCE: ? Seated at edge of mat: sit<>stands with 1 inch foam under right LE x 5 reps, then under prosthesis x 5 reps. Min assist with UE support needed with foam under prosthesis>right LE.  ?Standing with one hand on sturdy surface/HHA other side: forward/backward stepping over yard stick to color circle targets for 10 reps each side, then lateral foot taps over yard stick/back for 10 reps each side. Min to mod assist for balance.  ? ?  ? ? ? ?PATIENT EDUCATION: ?Education details: continue with HEP for strengthening ?Person educated: Patient ?Education method: Explanation, Demonstration, and Verbal cues ?Education comprehension: verbalized understanding, returned demonstration, verbal cues required, and needs further education ? ? ?HOME EXERCISE PROGRAM: ?Access Code: [email or web address] ?URL: [email or web address] ?Date: 06/19/2021 ?Prepared by: Willow Ora ? ?Exercises ?Supine Bridge with Arms over Chest - 1 x daily - 5 x weekly - 1 sets - 10 reps ?Hooklying Clamshell with Resistance - 1 x daily - 5 x weekly - 1 sets - 10 reps ?Supine March with Resistance Band - 1 x daily - 5 x weekly - 1 sets - 10 reps ?Sit to Stand with Counter Support - 1 x daily - 5 x weekly - 1 sets - 10 reps ? ? ? PT Short Term Goals All STG to be met by 08/09/2021 ?  ? ?  ? PT SHORT TERM GOAL #1  ? Title Pt will be able to perform TUG <45 seconds with RW and CGA to improve functional mobility   ? Baseline 06/19/21: 49.38 sec's with min to mod assist for safety with RW/prosthesis; 46 seconds with RW and min to mod A for safety with LOB with making 180 deg turns.  ? Time   ? Period   ? Status Progressing continue  ? Target Date   ?  ? PT SHORT TERM GOAL #2  ? Title Pt will be able to ambulate 115' with RW with SBA to improve  household ambulation   ? Baseline 06/19/21: met distance portion with min guard to min assist for balance; Pt continues to require min A for safety (07/12/21)  ? Time   ? Period   ? Status Partially Met; progressing continue  ? Target Date    ?  ? PT SHORT TERM GOAL #3  ? Title Pt will be able to go up and down steps with 1 rail for UE and SBA to improve safety   ? Baseline 06/19/21: Deferred due to safety concerns; able to perform with bil HHA  ? Time   ? Period   ? Status Progressing continue  ? Target Date   ? ?  ?  ? ?  ? ? ?  PT Long Term Goals - all LTG to be met by 09/06/2021 ?  ? ?  ? PT LONG TERM GOAL #1  ? Title Pt will be able to perform 5x sit to stand with bil UE support and without UE support when standing and in <20 seconds to improve functional strength with transfers   ? Baseline 36 seconds bil UE intermittent support of walker ; 19 seconds but very poor eccentric control and pt braces knees against table  ? Time 8   ? Period Weeks   ? Status Progressing continue  ?    ?  ? PT LONG TERM GOAL #2  ? Title Patient will demo >32/56 on BBS to improve functional standing balance and reduce fall risk   ? Baseline 17/56 (2//15/23) ; 26/56 (07/12/21)  ? Time 8   ? Period Weeks   ? Status Progressing continue (revised by increasing goal from >27/56 to >32/56 on 07/12/21)  ?    ?  ? PT LONG TERM GOAL #3  ? Title Pt will demo >230' with RW and SBA to improve functional ambulation within home   ? Baseline 10-15 feet with 4 WW within home (05/15/21) ; 115' with RW and min A  ? Time 8   ? Period Weeks   ? Status Progressing continue  ?    ?  ? PT LONG TERM GOAL #4  ? Title Pt will be I and compliant with HEP to self manage symptoms   ? Baseline TBD   ? Time 8   ? Period Weeks   ? Status Progressing continue  ? Target Date   ? ?  ?  ? ?  ? ? ? Plan   ? ? Clinical Impression Statement Patient has been seen for total of 8 sessions from 05/15/21 to 07/12/21. Pt is making gradual progress towards his short term and long term  goals. Patient has demonstrated improved score in Timed Up and Go test and Berg Balance Scale compared to his intial assessment. Patient still requires min to mod A with turning with gait due to LOB. Patient has dif

## 2021-07-16 ENCOUNTER — Encounter: Payer: Self-pay | Admitting: Physical Therapy

## 2021-07-16 ENCOUNTER — Ambulatory Visit: Payer: Medicare Other | Admitting: Physical Therapy

## 2021-07-16 DIAGNOSIS — R2689 Other abnormalities of gait and mobility: Secondary | ICD-10-CM

## 2021-07-16 DIAGNOSIS — M6281 Muscle weakness (generalized): Secondary | ICD-10-CM

## 2021-07-16 NOTE — Therapy (Signed)
?OUTPATIENT PHYSICAL THERAPY TREATMENT NOTE ? ? ?Patient Name: Kristopher Ruiz. ?MRN: [id number] ?DOB:08/22/1950, 71 y.o., male ?Today's Date: 07/16/2021 ? ?PCP: Gifford Shave, MD ?REFERRING PROVIDER: Dr. Sharol Given  ? ? PT End of Session - 07/16/21 1239   ? ? Visit Number 9   ? Number of Visits 24   ? Date for PT Re-Evaluation 09/06/21   ? Authorization Type [employer] Medicare/medicaid; EVAL 8/85/02; recert on 7/74/12   ? Progress Note Due on Visit 18   ? PT Start Time 1233   ? PT Stop Time 1315   ? PT Time Calculation (min) 42 min   ? Equipment Utilized During Treatment Gait belt   ? Activity Tolerance Patient tolerated treatment well   ? Behavior During Therapy [facility] for tasks assessed/performed   ? ?  ?  ? ?  ? ? ?Past Medical History:  ?Diagnosis Date  ? Arthritis   ? Cataract   ? NS OU  ? Hypertension   ? Stroke [facility])   ? ?Past Surgical History:  ?Procedure Laterality Date  ? HIP FRACTURE SURGERY    ? LEG AMPUTATION BELOW KNEE    ? left leg  ? TOTAL HIP ARTHROPLASTY Left 10/31/2019  ? TOTAL HIP ARTHROPLASTY Left 10/31/2019  ? Procedure: LEFT TOTAL HIP ARTHROPLASTY, POSTERIOR;  Surgeon: Leandrew Koyanagi, MD;  Location: [facility];  Service: Orthopedics;  Laterality: Left;  ? ?Patient Active Problem List  ? Diagnosis Date Noted  ? Anemia 07/06/2020  ? 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 Closed displaced fracture of left femoral neck with delayed healing 09/28/2019  ? Establishing care with new doctor, encounter for 06/01/2019  ? TIA (transient ischemic attack) 09/13/2018  ? Cerebellar stroke ([place]) 09/06/2018  ? Hypoglycemia due to insulin 09/06/2018  ? Tobacco abuse 09/06/2018  ? Hyperlipidemia 09/06/2018  ? Essential hypertension 09/06/2018  ? 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 ? ?REFERRING DIAG: R BKA  ? ?THERAPY DIAG:  ?Other abnormalities of gait and mobility ? ?Muscle weakness (generalized) ? ?PERTINENT HISTORY: Hx of L BKA, CVA, HTN  ? ?PRECAUTIONS: Fall  ? ?SUBJECTIVE: No new  falls or pain to report. Some questions about HEP.  ? ?PAIN:  ?Are you having pain? No ? ? ? ?TODAY'S TREATMENT: ?07/16/21 ?CURRENT PROSTHETIC WEAR ASSESSMENT: ?Donning prosthesis: Modified independence ?Doffing prosthesis: Modified independence ?Prosthetic wear tolerance: most awake hours with 1-2 hour break hours/day, daily days/week ?Residual limb condition: intact per pt report ?Comments: no change. Still gets occasional pressure on tibial area. Has appt with [facility] next Thursday 07/25/21. ? ?STRENGTHENING  ? Scifit UE/LE's level 4.0 x 8 minutes with goal >/= 75 steps per minute for strengthening, reciprocal movements and activity tolerance.  ?  ? ?TRANSFERS:  ?Min guard to min assist for sit<>stands with occasional need of UE support upon standing for balance. Cues for weight shifting to assist with standing and to move slowly for improved balance. Performed for several reps through out session.  ? ?GAIT: ?Gait pattern: step through pattern, decreased stride length, and ataxic ?Distance walked: 40 x 1, 60 x 1, plus around clinic with session ?Assistive device utilized: Environmental consultant - 2 wheeled and prosthesis ?Level of assistance:  min guard at times, mostly min assist ?Comments: reminder cues to move slowly for improved step placement/LE control with movements and for upright posture.  ? ? NMR/BALANCE: ? Side Stepping: in parallel bars with UE support on bars for 3 laps toward each direction, cues  on posture, forward gaze and step length with min guard to min assist for balance.  ?Stepping Strategy: with bil progressing to single UE support while standing across red foam beam for alternating forward stepping to floor/back onto beam for several step with bil UE support, then with single UE support (~5-6 with left UE support only, then same with right UE support only). Min guard to min assist for balance   ?Standing across red foam beam with no UE support: EC for 30 seconds x 3 reps with feet hip width apart, min guard  to min assist for balance. Then with EC alternating UE raises for ~5-6 reps each side, limited  range of motion noted, min guard to min assist.  ? ? ? ?PATIENT EDUCATION: ?Education details: continue with HEP for strengthening ?Person educated: Patient ?Education method: Explanation, Demonstration, and Verbal cues ?Education comprehension: verbalized understanding, returned demonstration, verbal cues required, and needs further education ? ? ?HOME EXERCISE PROGRAM: ?Access Code: [email or web address] ?URL: [email or web address] ?Date: 06/19/2021 ?Prepared by: Willow Ora ? ?Exercises ?Supine Bridge with Arms over Chest - 1 x daily - 5 x weekly - 1 sets - 10 reps ?Hooklying Clamshell with Resistance - 1 x daily - 5 x weekly - 1 sets - 10 reps ?Supine March with Resistance Band - 1 x daily - 5 x weekly - 1 sets - 10 reps ?Sit to Stand with Counter Support - 1 x daily - 5 x weekly - 1 sets - 10 reps ? ? ? PT Short Term Goals All STG to be met by 08/09/2021 ?  ? ?  ? PT SHORT TERM GOAL #1  ? Title Pt will be able to perform TUG <45 seconds with RW and CGA to improve functional mobility   ? Baseline 07/12/21: 46 seconds with RW and min to mod A for safety with LOB with making 180 deg turns.  ? Time   ? Period   ? Status Partially met,  continue goal  ? Target Date   ?  ? PT SHORT TERM GOAL #2  ? Title Pt will be able to ambulate 115' with RW with SBA to improve household ambulation   ? Baseline 07/12/21: Pt continues to require min guard assist to min A for safety   ? Time   ? Period   ? Status Partially Met; continue goal  ? Target Date    ?  ? PT SHORT TERM GOAL #3  ? Title Pt will be able to go up and down steps with 1 rail for UE and SBA to improve safety   ? Baseline 07/12/21: able to perform with bil HHA  ? Time   ? Period   ? Status Progressing, continue goal  ? Target Date   ? ?  ?  ? ?  ? ? ? PT Long Term Goals - all LTG to be met by 09/06/2021 ?  ? ?  ? PT LONG TERM GOAL #1  ? Title Pt will be able to perform 5x sit  to stand with bil UE support and without UE support when standing and in <20 seconds to improve functional strength with transfers   ? Baseline 07/12/21: 19 seconds but very poor eccentric control and pt braces knees against table, improved just not to goal  ? Time 8   ? Period Weeks   ? Status Partially met, continue goal  ?    ?  ? PT LONG TERM GOAL #2  ? Title Patient will demo >  32/56 on BBS to improve functional standing balance and reduce fall risk   ? Baseline 07/12/21:  26/56   ? Time 8   ? Period Weeks   ? Status Revised,  continue goal  ?    ?  ? PT LONG TERM GOAL #3  ? Title Pt will demo >230' with RW and SBA to improve functional ambulation within home   ? Baseline 07/12/21: 115' with RW and min A, improved just not to goal  ? Time 8   ? Period Weeks   ? Status Progressing,  continue goal  ?    ?  ? PT LONG TERM GOAL #4  ? Title Pt will be I and compliant with HEP to self manage symptoms   ? Baseline  07/12/21: has program, will benefit from updating as progresses  ? Time 8   ? Period Weeks   ? Status Progressing,  continue goal  ? Target Date   ? ?  ?  ? ?  ? ? ? Plan   ? ? Clinical Impression Statement Today's skilled session continued to focus on strengthening, gait with RW and balance training with no issues noted or reported in session. The pt is progressing and should benefit from continued PT to progress toward unmet goals.   ? Personal Factors and Comorbidities Age;Comorbidity 2   ? Comorbidities Hx of L BKA, CVA, HTN   ? Examination-Activity Limitations Caring for Others;Carry;Squat;Stairs;Stand;Transfers   ? Examination-Participation Restrictions Church;Cleaning;Community Activity;Meal Prep   ? Stability/Clinical Decision Making Stable/Uncomplicated   ? Rehab Potential Good   ? PT Frequency 2x / week   ? PT Duration 8 weeks  ? PT Treatment/Interventions ADLs/Self Care Home Management;Cryotherapy;Electrical Stimulation;Moist Heat;Gait training;Stair training;Functional mobility training;Therapeutic  activities;Therapeutic exercise;Balance training;Manual techniques;Wheelchair mobility training;Prosthetic Training;Orthotic Fit/Training;Patient/family education;Neuromuscular re-education;Passive range

## 2021-07-19 ENCOUNTER — Ambulatory Visit: Payer: Medicare Other

## 2021-07-19 ENCOUNTER — Telehealth: Payer: Self-pay | Admitting: Pharmacist

## 2021-07-19 MED ORDER — NICOTINE 14 MG/24HR TD PT24: 14.0000 mg | MEDICATED_PATCH | Freq: Every day | TRANSDERMAL | 1 refills | Status: DC

## 2021-07-19 NOTE — Telephone Encounter (Signed)
Patient contacted for follow/up of tobacco intake reduction / cessation attempt.  ? ?Since last contact patient reports smoking only 0-3 per day.  He has recently been smoking only in the morning.  States he is able to refrain from smoking in the evening.  He is currently purchasing cigarettes 2 at a time.  ? ?Medications currently being used;  ?Nicotine patch - no more 7mg  patches - patient requests 14mg  patches to help him quit complete. Patient educated on purpose, proper use and potential adverse effects of insomnia and vivid dreams with higher dose.  Following instruction patient verbalized understanding of treatment plan.  ? ?Rates IMPORTANCE of quitting tobacco remains high.  ? ?Patient agreed with a plan to quit completely for 2 days over the next 1 week.  ?I agreed to supply 14mg  nicotine patch supply.  ? ? ?Total time with patient call and documentation of interaction: 12 minutes. ? ?F/U Phone call planned: 7-10 days.  ? ? ?

## 2021-07-19 NOTE — Telephone Encounter (Signed)
-----   Message from Kathrin [facility], RPH-CPP sent at 06/25/2021  4:50 PM EDT ----- ?Regarding: OFF patches - Quit?  only smoking 1-2 per day per his report ? ? ?

## 2021-07-22 NOTE — Telephone Encounter (Signed)
Noted and agree. 

## 2021-07-23 ENCOUNTER — Ambulatory Visit: Payer: Medicare Other

## 2021-07-23 DIAGNOSIS — R2689 Other abnormalities of gait and mobility: Secondary | ICD-10-CM

## 2021-07-23 DIAGNOSIS — M6281 Muscle weakness (generalized): Secondary | ICD-10-CM

## 2021-07-23 NOTE — Therapy (Signed)
?OUTPATIENT PHYSICAL THERAPY TREATMENT NOTE ? ? ?Patient Name: Kristopher Ruiz. ?MRN: [id number] ?DOB:17-Nov-1950, 71 y.o., male ?Today's Date: 07/23/2021 ? ?PCP: Gifford Shave, MD ?REFERRING PROVIDER: Dr. Sharol Given  ? ? PT End of Session - 07/23/21 1320   ? ? Visit Number 10   ? Number of Visits 24   ? Date for PT Re-Evaluation 09/06/21   ? Authorization Type [employer]HC Medicare/medicaid; EVAL 7/89/38; recert on 03/31/73   ? Progress Note Due on Visit 18   ? PT Start Time 1320   ? PT Stop Time 1025   ? PT Time Calculation (min) 39 min   ? Equipment Utilized During Treatment Gait belt   ? Activity Tolerance Patient tolerated treatment well   ? Behavior During Therapy [facility] for tasks assessed/performed   ? ?  ?  ? ?  ? ? ?Past Medical History:  ?Diagnosis Date  ? Arthritis   ? Cataract   ? NS OU  ? Hypertension   ? Stroke [facility])   ? ?Past Surgical History:  ?Procedure Laterality Date  ? HIP FRACTURE SURGERY    ? LEG AMPUTATION BELOW KNEE    ? left leg  ? TOTAL HIP ARTHROPLASTY Left 10/31/2019  ? TOTAL HIP ARTHROPLASTY Left 10/31/2019  ? Procedure: LEFT TOTAL HIP ARTHROPLASTY, POSTERIOR;  Surgeon: Leandrew Koyanagi, MD;  Location: [place];  Service: Orthopedics;  Laterality: Left;  ? ?Patient Active Problem List  ? Diagnosis Date Noted  ? Anemia 07/06/2020  ? 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 Closed displaced fracture of left femoral neck with delayed healing 09/28/2019  ? Establishing care with new doctor, encounter for 06/01/2019  ? TIA (transient ischemic attack) 09/13/2018  ? Cerebellar stroke ([place]) 09/06/2018  ? Hypoglycemia due to insulin 09/06/2018  ? Tobacco abuse 09/06/2018  ? Hyperlipidemia 09/06/2018  ? Essential hypertension 09/06/2018  ? 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[place]) 04/16/2017  ? ? ?REFERRING DIAG: R BKA  ? ?THERAPY DIAG:  ?Other abnormalities of gait and mobility ? ?Muscle weakness (generalized) ? ?PERTINENT HISTORY: Hx of L BKA, CVA, HTN  ? ?PRECAUTIONS: Fall  ? ?SUBJECTIVE: Pt  denies any new issues. Has been working on his exercises.  ? ?PAIN:  ?Are you having pain? No ? ? ? ?TODAY'S TREATMENT: ? Prosthetics: ?Prosthetic care comments: Pt wearing 4 ply socks. Reports he always wears that. Does not have to wear shrinker ?Donning prosthesis: Modified independence ?Doffing prosthesis: Modified independence ?Prosthetic wear tolerance: all awake  hours/day, 7 days/week ?Prosthetic weight bearing tolerance:  ?Residual limb condition: pt denies any issues with skin ? ?PT tried to call [employer] to check on status of w/c but could not get someone on food. Will try again after session. ?   ?GAIT: ?Gait pattern: step through pattern, decreased step length- Left, decreased stance time- Right, and ataxic ?Distance walked: 39' ?Assistive device utilized: Environmental consultant - 2 wheeled and prosthesis ?Level of assistance: Min A ?Comments: Pt was cued to try to engage core and focus on right foot placement with steps as well as stay up in walker. ? ?Standing balance at walker: BUE with tapping 2 different colored circles on floor on command, standing without UE support 1 min x 2 with verbal cues to engage core to help with stability, adding in alternating shoulder flexion x 10, tapping 2 circles with only RUE support x 10. Trialed with LUE support but LOB due to poor stability on prosthesis with same side grip. ? ?Showed pt  upright pilon with full weight shift in mirror as he was wondering about knee being offset. Verbalized understanding after that is upright with full weight bearing. ? ?Pt ambulated another 115' min assist at end of session with RW and prosthesis ? ? ? ?PATIENT EDUCATION: ? ? ? ?HOME EXERCISE PROGRAM: ?Access Code: [email or web address] ?URL: [email or web address] ?Date: 06/19/2021 ?Prepared by: Willow Ora ? ?Exercises ?Supine Bridge with Arms over Chest - 1 x daily - 5 x weekly - 1 sets - 10 reps ?Hooklying Clamshell with Resistance - 1 x daily - 5 x weekly - 1 sets - 10 reps ?Supine March with  Resistance Band - 1 x daily - 5 x weekly - 1 sets - 10 reps ?Sit to Stand with Counter Support - 1 x daily - 5 x weekly - 1 sets - 10 reps ? ? ? PT Short Term Goals All STG to be met by 08/09/2021 ?  ? ?  ? PT SHORT TERM GOAL #1  ? Title Pt will be able to perform TUG <45 seconds with RW and CGA to improve functional mobility   ? Baseline 07/12/21: 46 seconds with RW and min to mod A for safety with LOB with making 180 deg turns.  ? Time   ? Period   ? Status Partially met,  continue goal  ? Target Date   ?  ? PT SHORT TERM GOAL #2  ? Title Pt will be able to ambulate 115' with RW with SBA to improve household ambulation   ? Baseline 07/12/21: Pt continues to require min guard assist to min A for safety   ? Time   ? Period   ? Status Partially Met; continue goal  ? Target Date    ?  ? PT SHORT TERM GOAL #3  ? Title Pt will be able to go up and down steps with 1 rail for UE and SBA to improve safety   ? Baseline 07/12/21: able to perform with bil HHA  ? Time   ? Period   ? Status Progressing, continue goal  ? Target Date   ? ?  ?  ? ?  ? ? ? PT Long Term Goals - all LTG to be met by 09/06/2021 ?  ? ?  ? PT LONG TERM GOAL #1  ? Title Pt will be able to perform 5x sit to stand with bil UE support and without UE support when standing and in <20 seconds to improve functional strength with transfers   ? Baseline 07/12/21: 19 seconds but very poor eccentric control and pt braces knees against table, improved just not to goal  ? Time 8   ? Period Weeks   ? Status Partially met, continue goal  ?    ?  ? PT LONG TERM GOAL #2  ? Title Patient will demo >32/56 on BBS to improve functional standing balance and reduce fall risk   ? Baseline 07/12/21:  26/56   ? Time 8   ? Period Weeks   ? Status Revised,  continue goal  ?    ?  ? PT LONG TERM GOAL #3  ? Title Pt will demo >230' with RW and SBA to improve functional ambulation within home   ? Baseline 07/12/21: 115' with RW and min A, improved just not to goal  ? Time 8   ? Period Weeks    ? Status Progressing,  continue goal  ?    ?  ? PT LONG TERM GOAL #  4  ? Title Pt will be I and compliant with HEP to self manage symptoms   ? Baseline  07/12/21: has program, will benefit from updating as progresses  ? Time 8   ? Period Weeks   ? Status Progressing,  continue goal  ? Target Date   ? ?  ?  ? ?  ? ? ? Plan   ? ? Clinical Impression Statement PT continued to work on gait and balance activities with more core activation to help. Pt did better with slowing down focusing on movements. Less assistance with gait today.  ? Personal Factors and Comorbidities Age;Comorbidity 2   ? Comorbidities Hx of L BKA, CVA, HTN   ? Examination-Activity Limitations Caring for Others;Carry;Squat;Stairs;Stand;Transfers   ? Examination-Participation Restrictions Church;Cleaning;Community Activity;Meal Prep   ? Stability/Clinical Decision Making Stable/Uncomplicated   ? Rehab Potential Good   ? PT Frequency 2x / week   ? PT Duration 8 weeks  ? PT Treatment/Interventions ADLs/Self Care Home Management;Cryotherapy;Electrical Stimulation;Moist Heat;Gait training;Stair training;Functional mobility training;Therapeutic activities;Therapeutic exercise;Balance training;Manual techniques;Wheelchair mobility training;Prosthetic Training;Orthotic Fit/Training;Patient/family education;Neuromuscular re-education;Passive range of motion;Energy conservation;Joint Manipulations   ? PT Next Visit Plan I did call [employer] to and spoke to someone after session and they are still saying they did not receive fax. I got 2 fax numbers from them and refaxed. Work on engaging core and slowing down movements for more control due to ataxia. balance training with decreased UE support. Try tall kneeling activities, seated on balance disc, ? Resisted gait/stepping in parallel bars; continue to work on turning and balance, reaching with R UE and R LE  ? Consulted and Agree with Plan of Care Patient   ? ?  ?  ? ?  ? ? ?Cherly Anderson, PT, DPT, NCS ? ?[address] ?[address], [address] ?[place], [place] [address] ?[phone] ?07/23/21, 3:22 PM  ? ?   ?

## 2021-07-24 ENCOUNTER — Telehealth: Payer: Self-pay | Admitting: Orthopedic Surgery

## 2021-07-24 NOTE — Telephone Encounter (Signed)
He hasn't been seen in a year. Can you please call him to schedule him for updated visit for home health orders? Thank you. ?

## 2021-07-24 NOTE — Telephone Encounter (Signed)
Noted  

## 2021-07-24 NOTE — Telephone Encounter (Signed)
Pt has not been seen in a year, lmom for pt to return call to sch appt with Duda/Erin at next opening. See previous message in patient chart. ?

## 2021-07-24 NOTE — Telephone Encounter (Signed)
They need current weight & Height, with a office note from the last 90 days --noting the need for the wheel chair  ?

## 2021-07-26 ENCOUNTER — Ambulatory Visit: Payer: Medicare Other

## 2021-07-26 DIAGNOSIS — M6281 Muscle weakness (generalized): Secondary | ICD-10-CM

## 2021-07-26 DIAGNOSIS — R2689 Other abnormalities of gait and mobility: Secondary | ICD-10-CM

## 2021-07-26 NOTE — Therapy (Signed)
?OUTPATIENT PHYSICAL THERAPY TREATMENT NOTE ? ? ?Patient Name: Kristopher Ruiz. ?MRN: [id number] ?DOB:Aug 29, 1950, 71 y.o., male ?Today's Date: 07/26/2021 ? ?PCP: Gifford Shave, MD ?REFERRING PROVIDER: Dr. Sharol Given  ? ? PT End of Session - 07/26/21 1316   ? ? Visit Number 11   ? Number of Visits 24   ? Date for PT Re-Evaluation 09/06/21   ? Authorization Type UHC Medicare/medicaid; EVAL 4/43/15; recert on 4/00/86   ? Progress Note Due on Visit 18   ? PT Start Time 1315   ? PT Stop Time 1356   ? PT Time Calculation (min) 41 min   ? Equipment Utilized During Treatment Gait belt   ? Activity Tolerance Patient tolerated treatment well   ? Behavior During Therapy [facility] for tasks assessed/performed   ? ?  ?  ? ?  ? ? ?Past Medical History:  ?Diagnosis Date  ? Arthritis   ? Cataract   ? NS OU  ? Hypertension   ? Stroke [facility])   ? ?Past Surgical History:  ?Procedure Laterality Date  ? HIP FRACTURE SURGERY    ? LEG AMPUTATION BELOW KNEE    ? left leg  ? TOTAL HIP ARTHROPLASTY Left 10/31/2019  ? TOTAL HIP ARTHROPLASTY Left 10/31/2019  ? Procedure: LEFT TOTAL HIP ARTHROPLASTY, POSTERIOR;  Surgeon: Leandrew Koyanagi, MD;  Location: [place];  Service: Orthopedics;  Laterality: Left;  ? ?Patient Active Problem List  ? Diagnosis Date Noted  ? Anemia 07/06/2020  ? 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 Closed displaced fracture of left femoral neck with delayed healing 09/28/2019  ? Establishing care with new doctor, encounter for 06/01/2019  ? TIA (transient ischemic attack) 09/13/2018  ? Cerebellar stroke ([place]) 09/06/2018  ? Hypoglycemia due to insulin 09/06/2018  ? Tobacco abuse 09/06/2018  ? Hyperlipidemia 09/06/2018  ? Essential hypertension 09/06/2018  ? 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 ? ?REFERRING DIAG: R BKA  ? ?THERAPY DIAG:  ?Other abnormalities of gait and mobility ? ?Muscle weakness (generalized) ? ?PERTINENT HISTORY: Hx of L BKA, CVA, HTN  ? ?PRECAUTIONS: Fall  ? ?SUBJECTIVE: Pt  denies any new issues. No falls. ? ?PAIN:  ?Are you having pain? No ? ? ? ?TODAY'S TREATMENT: ? Prosthetics: ?Prosthetic care comments: Pt wearing 4 ply socks. Reports he always wears that. Does not have to wear shrinker ?Donning prosthesis: Modified independence ?Doffing prosthesis: Modified independence ?Prosthetic wear tolerance: all awake  hours/day, 7 days/week ?Prosthetic weight bearing tolerance:  ?Residual limb condition: pt denies any issues with skin ?   ? ?GAIT: ?Gait pattern: step through pattern, decreased step length- Right, and ataxic ?Distance walked: 230' ?Assistive device utilized: Environmental consultant - 2 wheeled and prosthesis ?Level of assistance: CGA and Min A ?Comments: Verbal cues for upright posture and to engage core with gait and focus on foot placement. BP after gait =120/72 ? ?Sitting edge of mat on green disc for unstable surface: PT in front for safety gently stabilizing at legs at times, pt performed bilateral shoulder flexion x 10, then added in alternating shoulder flexion with 3# weights x 10, then performed bilateral rows with RTB 10 x 2 with verbal cues to engage core and for form, then reaching across body for 3# weight on mat on side and placing on other side. Performed x 10 each side with cues to look with head as well and engage core. ? ?Standing at walker: without UE support x 30 sec, then added  in head turns to each side x 10 then up/down x 10, then RUE support on walker stepping forward and back x 10 with RLE working on control and cues to engage core to help, then staggered stance x 30 sec each position. CGA/min assist for safety with all activities. ? ? ?PATIENT EDUCATION: ?Education details: discussed performing bilateral rows with RTB in doorway seated upright in chair with engaging core as well. ?Person educated: Patient ?Education method: Explanation and Demonstration ?Education comprehension: verbalized understanding and returned demonstration ? ? ? ? ?HOME EXERCISE  PROGRAM: ?Access Code: [email or web address] ?URL: [email or web address] ?Date: 06/19/2021 ?Prepared by: Willow Ora ? ?Exercises ?Supine Bridge with Arms over Chest - 1 x daily - 5 x weekly - 1 sets - 10 reps ?Hooklying Clamshell with Resistance - 1 x daily - 5 x weekly - 1 sets - 10 reps ?Supine March with Resistance Band - 1 x daily - 5 x weekly - 1 sets - 10 reps ?Sit to Stand with Counter Support - 1 x daily - 5 x weekly - 1 sets - 10 reps ? ? ? PT Short Term Goals All STG to be met by 08/09/2021 ?  ? ?  ? PT SHORT TERM GOAL #1  ? Title Pt will be able to perform TUG <45 seconds with RW and CGA to improve functional mobility   ? Baseline 07/12/21: 46 seconds with RW and min to mod A for safety with LOB with making 180 deg turns.  ? Time   ? Period   ? Status Partially met,  continue goal  ? Target Date   ?  ? PT SHORT TERM GOAL #2  ? Title Pt will be able to ambulate 115' with RW with SBA to improve household ambulation   ? Baseline 07/12/21: Pt continues to require min guard assist to min A for safety   ? Time   ? Period   ? Status Partially Met; continue goal  ? Target Date    ?  ? PT SHORT TERM GOAL #3  ? Title Pt will be able to go up and down steps with 1 rail for UE and SBA to improve safety   ? Baseline 07/12/21: able to perform with bil HHA  ? Time   ? Period   ? Status Progressing, continue goal  ? Target Date   ? ?  ?  ? ?  ? ? ? PT Long Term Goals - all LTG to be met by 09/06/2021 ?  ? ?  ? PT LONG TERM GOAL #1  ? Title Pt will be able to perform 5x sit to stand with bil UE support and without UE support when standing and in <20 seconds to improve functional strength with transfers   ? Baseline 07/12/21: 19 seconds but very poor eccentric control and pt braces knees against table, improved just not to goal  ? Time 8   ? Period Weeks   ? Status Partially met, continue goal  ?    ?  ? PT LONG TERM GOAL #2  ? Title Patient will demo >32/56 on BBS to improve functional standing balance and reduce fall risk   ?  Baseline 07/12/21:  26/56   ? Time 8   ? Period Weeks   ? Status Revised,  continue goal  ?    ?  ? PT LONG TERM GOAL #3  ? Title Pt will demo >230' with RW and SBA to improve functional ambulation within home   ?  Baseline 07/12/21: 115' with RW and min A, improved just not to goal  ? Time 8   ? Period Weeks   ? Status Progressing,  continue goal  ?    ?  ? PT LONG TERM GOAL #4  ? Title Pt will be I and compliant with HEP to self manage symptoms   ? Baseline  07/12/21: has program, will benefit from updating as progresses  ? Time 8   ? Period Weeks   ? Status Progressing,  continue goal  ? Target Date   ? ?  ?  ? ?  ? ? ? Plan   ? ? Clinical Impression Statement Pt initially limited as was having issues with something in throat and cough which increased his ataxia and had to wait to begin until calmed down. Focused on slow, controlled movements with gait and then more core strengthening activities adding compliant seated surface. This was challenging for pt but he enjoyed it.  ? Personal Factors and Comorbidities Age;Comorbidity 2   ? Comorbidities Hx of L BKA, CVA, HTN   ? Examination-Activity Limitations Caring for Others;Carry;Squat;Stairs;Stand;Transfers   ? Examination-Participation Restrictions Church;Cleaning;Community Activity;Meal Prep   ? Stability/Clinical Decision Making Stable/Uncomplicated   ? Rehab Potential Good   ? PT Frequency 2x / week   ? PT Duration 8 weeks  ? PT Treatment/Interventions ADLs/Self Care Home Management;Cryotherapy;Electrical Stimulation;Moist Heat;Gait training;Stair training;Functional mobility training;Therapeutic activities;Therapeutic exercise;Balance training;Manual techniques;Wheelchair mobility training;Prosthetic Training;Orthotic Fit/Training;Patient/family education;Neuromuscular re-education;Passive range of motion;Energy conservation;Joint Manipulations   ? PT Next Visit Plan Add picture for scapular retractions seated with band to HEP. I did call [employer] to and spoke to  someone after session and they are still saying they did not receive fax. I got 2 fax numbers from them and refaxed. Work on engaging core and slowing down movements for more control due to ataxia. balance training with

## 2021-07-31 ENCOUNTER — Telehealth: Payer: Self-pay | Admitting: Pharmacist

## 2021-07-31 DIAGNOSIS — Z720 Tobacco use: Secondary | ICD-10-CM

## 2021-07-31 NOTE — Telephone Encounter (Signed)
Patient contacted for follow/up of tobacco intake reduction / tobacco cessation attempt.  ? ?Since last contact patient reports that he quit for 4 days. However he continues to smoke intermittently.  He reports his major trigger is smoking after meals.  ?Medications currently being used;  ?Nicotine patch  ? ?Patient denies any significant side effects from tobacco cessation therapy.  ?Rates IMPORTANCE of quitting tobacco remains high.  ?Motivation to quit: health and breathing.  ? ?He states only 3 cigarettes in his possession.  We agreed that these 3 should be his final cigarettes and he should plan a quit day over the upcoming weekend.  ?He was confident that he could quit completely.   ? ? ?Total time with patient call and documentation of interaction: 14 minutes. ? ?F/U Phone call planned: 1 week to assess for complete cessation.  ? ? ?

## 2021-07-31 NOTE — Assessment & Plan Note (Signed)
Patient contacted for follow/up of tobacco intake reduction / tobacco cessation attempt.  ? ?Since last contact patient reports that he quit for 4 days. However he continues to smoke intermittently.  He reports his major trigger is smoking after meals.  ?Medications currently being used;  ?Nicotine patch  ? ?Patient denies any significant side effects from tobacco cessation therapy.  ?Rates IMPORTANCE of quitting tobacco remains high.  ?Motivation to quit: health and breathing.  ? ?He states only 3 cigarettes in his possession.  We agreed that these 3 should be his final cigarettes and he should plan a quit day over the upcoming weekend.  ?He was confident that he could quit completely.   ?

## 2021-07-31 NOTE — Telephone Encounter (Signed)
Noted and agree. 

## 2021-07-31 NOTE — Telephone Encounter (Signed)
-----   Message from Leavy Cella, [place] sent at 07/19/2021  1:15 PM EDT ----- ?Regarding: Quit for 2 days - Using 14mg  nicotine patches? ? ? ?

## 2021-08-02 ENCOUNTER — Ambulatory Visit: Payer: Medicare Other | Attending: Orthopedic Surgery | Admitting: Physical Therapy

## 2021-08-02 ENCOUNTER — Encounter: Payer: Self-pay | Admitting: Physical Therapy

## 2021-08-02 DIAGNOSIS — S88112A Complete traumatic amputation at level between knee and ankle, left lower leg, initial encounter: Secondary | ICD-10-CM | POA: Insufficient documentation

## 2021-08-02 DIAGNOSIS — M6281 Muscle weakness (generalized): Secondary | ICD-10-CM | POA: Diagnosis present

## 2021-08-02 DIAGNOSIS — R2689 Other abnormalities of gait and mobility: Secondary | ICD-10-CM | POA: Insufficient documentation

## 2021-08-02 NOTE — Therapy (Signed)
?OUTPATIENT PHYSICAL THERAPY TREATMENT NOTE ? ? ?Patient Name: Kristopher Ruiz. ?MRN: [id number] ?DOB:07-27-1950, 71 y.o., male ?Today's Date: 08/02/2021 ? ?PCP: Gifford Shave, MD ?REFERRING PROVIDER: Dr. Sharol Given  ? ? PT End of Session - 08/02/21 1322   ? ? Visit Number 12   ? Number of Visits 24   ? Date for PT Re-Evaluation 09/06/21   ? Authorization Type UHC Medicare/medicaid; EVAL 06/13/38; recert on 0/86/76   ? Progress Note Due on Visit 18   ? PT Start Time 1320   ? PT Stop Time 1400   ? PT Time Calculation (min) 40 min   ? Equipment Utilized During Treatment Gait belt   ? Activity Tolerance Patient tolerated treatment well   ? Behavior During Therapy [facility] for tasks assessed/performed   ? ?  ?  ? ?  ? ? ?Past Medical History:  ?Diagnosis Date  ? Arthritis   ? Cataract   ? NS OU  ? Hypertension   ? Stroke [facility])   ? ?Past Surgical History:  ?Procedure Laterality Date  ? HIP FRACTURE SURGERY    ? LEG AMPUTATION BELOW KNEE    ? left leg  ? TOTAL HIP ARTHROPLASTY Left 10/31/2019  ? TOTAL HIP ARTHROPLASTY Left 10/31/2019  ? Procedure: LEFT TOTAL HIP ARTHROPLASTY, POSTERIOR;  Surgeon: Leandrew Koyanagi, MD;  Location: [place];  Service: Orthopedics;  Laterality: Left;  ? ?Patient Active Problem List  ? Diagnosis Date Noted  ? Anemia 07/06/2020  ? 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 Closed displaced fracture of left femoral neck with delayed healing 09/28/2019  ? Establishing care with new doctor, encounter for 06/01/2019  ? TIA (transient ischemic attack) 09/13/2018  ? Cerebellar stroke ([place]) 09/06/2018  ? Hypoglycemia due to insulin 09/06/2018  ? Tobacco abuse 09/06/2018  ? Hyperlipidemia 09/06/2018  ? Essential hypertension 09/06/2018  ? 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[place]) 04/16/2017  ? ? ?REFERRING DIAG: R BKA  ? ?THERAPY DIAG:  ?Other abnormalities of gait and mobility ? ?Muscle weakness (generalized) ? ?PERTINENT HISTORY: Hx of L BKA, CVA, HTN  ? ?PRECAUTIONS: Fall  ? ?SUBJECTIVE: No new  complaints. No falls or pain to report. Has not heard from [employer] about wheelchair.  ? ?PAIN:  ?Are you having pain? No ? ? ? ?TODAY'S TREATMENT: ? ?08/02/21 ?PROSTHETICS ?Prosthetic care comments:  Saw [name] at [facility] last week for prosthetic adjustment to medial edge (cut it down some so not too tight). No other changes were needed.  ?Donning prosthesis: Modified independence ?Doffing prosthesis: Modified independence ?Prosthetic wear tolerance: all awake  hours/day, 7 days/week ?Prosthetic weight bearing tolerance:  ?Residual limb condition: pt denies any issues with skin ? ?STRENGTHENING  ?Scifit UE/LE's level 4.5 x 6 minutes with goal >/=70 steps per minute for strengthening and activity tolerance.  ?   ? ?GAIT: ?Gait pattern: step through pattern, decreased step length- Right, and ataxic ?Distance walked: 115 x 2 reps ?Assistive device utilized: Environmental consultant - 2 wheeled and prosthesis ?Level of assistance:  min guard to min assist  ?Comments: Verbal cues for upright posture and to engage core with gait and focus on foot placement while going slow. ? ? NMR: ?  Static standing Balance: ?Surface: Floor ?Position: Feet Hip Width Apart ?Completed with: Eyes Open and Eyes Closed; EC 30 seconds x 3 reps, then with EC head movements left<>right, up<>down for 8-9 reps each.  ? ?Next to mat table with 4 inch box, one hand on chair  back/HHA on other side: alternating forward foot taps with goal to tap on black square of box for 10 reps each side. Cues to slow down and maintain wide base of support. Min guard to min assist for balance.  ? ? ? ? ? ?PATIENT EDUCATION: ?Education details: continue with current HEP; call Dr. Jess Barters office for appt for face to face to get wheelchair ?Person educated: Patient ?Education method: Explanation and Demonstration ?Education comprehension: verbalized understanding and returned demonstration ? ? ? ? ?HOME EXERCISE PROGRAM: ?Access Code: [email or web address] ?URL: [email or web address] ?Date:  06/19/2021 ?Prepared by: Willow Ora ? ?Exercises ?Supine Bridge with Arms over Chest - 1 x daily - 5 x weekly - 1 sets - 10 reps ?Hooklying Clamshell with Resistance - 1 x daily - 5 x weekly - 1 sets - 10 reps ?Supine March with Resistance Band - 1 x daily - 5 x weekly - 1 sets - 10 reps ?Sit to Stand with Counter Support - 1 x daily - 5 x weekly - 1 sets - 10 reps ? ? ? PT Short Term Goals All STG to be met by 08/09/2021 ?  ? ?  ? PT SHORT TERM GOAL #1  ? Title Pt will be able to perform TUG <45 seconds with RW and CGA to improve functional mobility   ? Baseline 07/12/21: 46 seconds with RW and min to mod A for safety with LOB with making 180 deg turns.  ? Time   ? Period   ? Status Partially met,  continue goal  ? Target Date   ?  ? PT SHORT TERM GOAL #2  ? Title Pt will be able to ambulate 115' with RW with SBA to improve household ambulation   ? Baseline 07/12/21: Pt continues to require min guard assist to min A for safety   ? Time   ? Period   ? Status Partially Met; continue goal  ? Target Date    ?  ? PT SHORT TERM GOAL #3  ? Title Pt will be able to go up and down steps with 1 rail for UE and SBA to improve safety   ? Baseline 07/12/21: able to perform with bil HHA  ? Time   ? Period   ? Status Progressing, continue goal  ? Target Date   ? ?  ?  ? ?  ? ? ? PT Long Term Goals - all LTG to be met by 09/06/2021 ?  ? ?  ? PT LONG TERM GOAL #1  ? Title Pt will be able to perform 5x sit to stand with bil UE support and without UE support when standing and in <20 seconds to improve functional strength with transfers   ? Baseline 07/12/21: 19 seconds but very poor eccentric control and pt braces knees against table, improved just not to goal  ? Time 8   ? Period Weeks   ? Status Partially met, continue goal  ?    ?  ? PT LONG TERM GOAL #2  ? Title Patient will demo >32/56 on BBS to improve functional standing balance and reduce fall risk   ? Baseline 07/12/21:  26/56   ? Time 8   ? Period Weeks   ? Status Revised,   continue goal  ?    ?  ? PT LONG TERM GOAL #3  ? Title Pt will demo >230' with RW and SBA to improve functional ambulation within home   ? Baseline 07/12/21: 115' with RW and  min A, improved just not to goal  ? Time 8   ? Period Weeks   ? Status Progressing,  continue goal  ?    ?  ? PT LONG TERM GOAL #4  ? Title Pt will be I and compliant with HEP to self manage symptoms   ? Baseline  07/12/21: has program, will benefit from updating as progresses  ? Time 8   ? Period Weeks   ? Status Progressing,  continue goal  ? Target Date   ? ?  ?  ? ?  ? ? ? Plan   ? ? Clinical Impression Statement Today's skilled session continued to focus on gait, strengthening and balance training with no issues noted or reported. The pt is making progress and should benefit from continued PT to progress toward unmet goals.   ? Personal Factors and Comorbidities Age;Comorbidity 2   ? Comorbidities Hx of L BKA, CVA, HTN   ? Examination-Activity Limitations Caring for Others;Carry;Squat;Stairs;Stand;Transfers   ? Examination-Participation Restrictions Church;Cleaning;Community Activity;Meal Prep   ? Stability/Clinical Decision Making Stable/Uncomplicated   ? Rehab Potential Good   ? PT Frequency 2x / week   ? PT Duration 8 weeks  ? PT Treatment/Interventions ADLs/Self Care Home Management;Cryotherapy;Electrical Stimulation;Moist Heat;Gait training;Stair training;Functional mobility training;Therapeutic activities;Therapeutic exercise;Balance training;Manual techniques;Wheelchair mobility training;Prosthetic Training;Orthotic Fit/Training;Patient/family education;Neuromuscular re-education;Passive range of motion;Energy conservation;Joint Manipulations   ? PT Next Visit Plan Check STGs due 08/09/21. Add picture for scapular retractions seated with band to HEP. Work on engaging core and slowing down movements for more control due to ataxia. balance training with decreased UE support. Try tall kneeling activities, seated on balance disc, continue  to work on turning and balance, reaching with R UE and R LE  ? Consulted and Agree with Plan of Care Patient   ? ?  ?  ? ?  ? ? ?Willow Ora, PTA, CLT ?[place] ?[address], [address]

## 2021-08-09 ENCOUNTER — Ambulatory Visit: Payer: Medicare Other | Admitting: Physical Therapy

## 2021-08-09 ENCOUNTER — Encounter: Payer: Self-pay | Admitting: Physical Therapy

## 2021-08-09 DIAGNOSIS — R2689 Other abnormalities of gait and mobility: Secondary | ICD-10-CM

## 2021-08-09 DIAGNOSIS — M6281 Muscle weakness (generalized): Secondary | ICD-10-CM

## 2021-08-09 NOTE — Therapy (Signed)
?OUTPATIENT PHYSICAL THERAPY TREATMENT NOTE ? ? ?Patient Name: Kristopher Ruiz. ?MRN: [id number] ?DOB:[birth year], 71 y.o., male ?Today's Date: 08/09/2021 ? ?PCP: Gifford Shave, MD ?REFERRING PROVIDER: Dr. Sharol Given  ? ? ? 08/09/21 1320  ?PT Visits / Re-Eval  ?Visit Number 13  ?Number of Visits 24  ?Date for PT Re-Evaluation 09/06/21  ?Authorization  ?Authorization Type U[employer] Medicare/medicaid; EVAL 05/21/23; recert on 07/25/04  ?Progress Note Due on Visit 18  ?PT Time Calculation  ?PT Start Time 1318  ?PT Stop Time 1400  ?PT Time Calculation (min) 42 min  ?PT - End of Session  ?Equipment Utilized During Treatment Gait belt  ?Activity Tolerance Patient tolerated treatment well  ?Behavior During Therapy [facility] for tasks assessed/performed  ? ? ? ?Past Medical History:  ?Diagnosis Date  ? Arthritis   ? Cataract   ? NS OU  ? Hypertension   ? Stroke [facility])   ? ?Past Surgical History:  ?Procedure Laterality Date  ? HIP FRACTURE SURGERY    ? LEG AMPUTATION BELOW KNEE    ? left leg  ? TOTAL HIP ARTHROPLASTY Left 10/31/2019  ? TOTAL HIP ARTHROPLASTY Left 10/31/2019  ? Procedure: LEFT TOTAL HIP ARTHROPLASTY, POSTERIOR;  Surgeon: Leandrew Koyanagi, MD;  Location: [place];  Service: Orthopedics;  Laterality: Left;  ? ?Patient Active Problem List  ? Diagnosis Date Noted  ? Anemia 07/06/2020  ? 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 Closed displaced fracture of left femoral neck with delayed healing 09/28/2019  ? Establishing care with new doctor, encounter for 06/01/2019  ? TIA (transient ischemic attack) 09/13/2018  ? Cerebellar stroke ([place]) 09/06/2018  ? Hypoglycemia due to insulin 09/06/2018  ? Tobacco abuse 09/06/2018  ? Hyperlipidemia 09/06/2018  ? Essential hypertension 09/06/2018  ? 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[place]) 04/16/2017  ? ? ?REFERRING DIAG: R BKA  ? ?THERAPY DIAG:  ?Other abnormalities of gait and mobility ? ?Muscle weakness (generalized) ? ?PERTINENT HISTORY: Hx of L BKA, CVA, HTN  ? ?PRECAUTIONS:  Fall  ? ?SUBJECTIVE: No new complaints. No falls or pain to report. Has not called Dr. Sharol Given for face to face appointment yet, plans to do so soon.  ? ?PAIN:  ?Are you having pain? No ? ? ? ?TODAY'S TREATMENT: ? ?08/09/2021 ?PROSTHETICS ?Prosthetic care comments:  No issues.  ?Donning prosthesis: Modified independence ?Doffing prosthesis: Modified independence ?Prosthetic wear tolerance: all awake  hours/day, 7 days/week ?Prosthetic weight bearing tolerance:  ?Residual limb condition: pt denies any issues with skin ? ?STRENGTHENING  ?Scifit UE/LE's level 4.5 x 6 minutes with goal >/=70 steps per minute for strengthening and activity tolerance.  ?   ? ?GAIT: ?Gait pattern: step through pattern, decreased step length- Right, and ataxic ?Distance walked: 115 x 2 reps, plus around clinic ?Assistive device utilized: Environmental consultant - 2 wheeled and prosthesis ?Level of assistance:  min guard to min assist  ?Comments: Verbal cues for upright posture and to engage core with gait and focus on foot placement while going slow. ?  ? [facility] PT Assessment - 08/09/21 1346   ? ?  ? Timed Up and Go Test  ? TUG Normal TUG   ? Normal TUG (seconds) 51.47   sec's with RW/prosthesis with min guard assist with gait and turns  ? ?  ?  ? ?  ?  ? ?STAIRS: ? Level of Assistance:  min guard to min assist ? Stair Negotiation Technique: Step to Pattern ?Alternating Pattern  ?Forwards with Bilateral Rails ?  Number of Stairs: 4  ? Height of Stairs: 6  ?Comments: min guard assist to ascend reciprocally with bil rail, min guard to min assist to descend reciprocally x 2 steps, the step to pattern with last 2 steps. Cues for hand advancement along rails and weight shifting.  ? ? ? ? ? ? ?PATIENT EDUCATION: ?Education details: continue with current HEP; called and set up appt with Dr. Jess Barters office for face to face for wheelchair ?Person educated: Patient ?Education method: Explanation and Demonstration ?Education comprehension: verbalized  understanding ? ? ? ? ?HOME EXERCISE PROGRAM: ?Access Code: [email or web address] ?URL: [email or web address] ?Date: 06/19/2021 ?Prepared by: Willow Ora ? ?Exercises ?Supine Bridge with Arms over Chest - 1 x daily - 5 x weekly - 1 sets - 10 reps ?Hooklying Clamshell with Resistance - 1 x daily - 5 x weekly - 1 sets - 10 reps ?Supine March with Resistance Band - 1 x daily - 5 x weekly - 1 sets - 10 reps ?Sit to Stand with Counter Support - 1 x daily - 5 x weekly - 1 sets - 10 reps ? ? ? PT Short Term Goals All STG to be met by 08/09/2021 ?  ? ?  ? PT SHORT TERM GOAL #1  ? Title Pt will be able to perform TUG <45 seconds with RW and CGA to improve functional mobility   ? Baseline 08/09/21: 51.47 seconds with RW, improved balance with only min guard assist needed, increased time with less assistance   ? Time   ? Period   ? Status PARTIALLY MET  ? Target Date   ?  ? PT SHORT TERM GOAL #2  ? Title Pt will be able to ambulate 115' with RW with SBA to improve household ambulation   ? Baseline 08/09/21: continues to need min assist at times, mostly min guard assist  ? Time   ? Period   ? Status PARTIALLY MET  ? Target Date    ?  ? PT SHORT TERM GOAL #3  ? Title Pt will be able to go up and down steps with 1 rail for UE and SBA to improve safety   ? Baseline 08/09/21: continues to need bil rails with min guard assist for safety  ? Time   ? Period   ? Status NOT MET  ? Target Date   ? ?  ?  ? ?  ? ? ? PT Long Term Goals - all LTG to be met by 09/06/2021 ?  ? ?  ? PT LONG TERM GOAL #1  ? Title Pt will be able to perform 5x sit to stand with bil UE support and without UE support when standing and in <20 seconds to improve functional strength with transfers   ? Baseline 07/12/21: 19 seconds but very poor eccentric control and pt braces knees against table, improved just not to goal  ? Time 8   ? Period Weeks   ? Status Partially met, continue goal  ?    ?  ? PT LONG TERM GOAL #2  ? Title Patient will demo >32/56 on BBS to improve  functional standing balance and reduce fall risk   ? Baseline 07/12/21:  26/56   ? Time 8   ? Period Weeks   ? Status Revised,  continue goal  ?    ?  ? PT LONG TERM GOAL #3  ? Title Pt will demo >230' with RW and SBA to improve functional ambulation within home   ?  Baseline 07/12/21: 115' with RW and min A, improved just not to goal  ? Time 8   ? Period Weeks   ? Status Progressing,  continue goal  ?    ?  ? PT LONG TERM GOAL #4  ? Title Pt will be I and compliant with HEP to self manage symptoms   ? Baseline  07/12/21: has program, will benefit from updating as progresses  ? Time 8   ? Period Weeks   ? Status Progressing,  continue goal  ? Target Date   ? ?  ?  ? ?  ? ? ? Plan   ? ? Clinical Impression Statement Today's skilled session continued to focus on gait, stairs and strengthening. Progress toward STGs has been made with them partially met. The pt should benefit from continued PT to progress toward unmet goals.   ? Personal Factors and Comorbidities Age;Comorbidity 2   ? Comorbidities Hx of L BKA, CVA, HTN   ? Examination-Activity Limitations Caring for Others;Carry;Squat;Stairs;Stand;Transfers   ? Examination-Participation Restrictions Church;Cleaning;Community Activity;Meal Prep   ? Stability/Clinical Decision Making Stable/Uncomplicated   ? Rehab Potential Good   ? PT Frequency 2x / week   ? PT Duration 8 weeks  ? PT Treatment/Interventions ADLs/Self Care Home Management;Cryotherapy;Electrical Stimulation;Moist Heat;Gait training;Stair training;Functional mobility training;Therapeutic activities;Therapeutic exercise;Balance training;Manual techniques;Wheelchair mobility training;Prosthetic Training;Orthotic Fit/Training;Patient/family education;Neuromuscular re-education;Passive range of motion;Energy conservation;Joint Manipulations   ? PT Next Visit Plan  Work on engaging core and slowing down movements for more control due to ataxia. balance training with decreased UE support. Try tall kneeling  activities, seated on balance disc, continue to work on turning and balance, reaching with R UE and R LE  ? Consulted and Agree with Plan of Care Patient   ? ?  ?  ? ?  ? ? ?Willow Ora, PTA, CLT ?[place] ?[place]

## 2021-08-13 ENCOUNTER — Ambulatory Visit: Payer: Medicare Other | Admitting: Rehabilitation

## 2021-08-13 ENCOUNTER — Encounter: Payer: Self-pay | Admitting: Rehabilitation

## 2021-08-13 DIAGNOSIS — R2689 Other abnormalities of gait and mobility: Secondary | ICD-10-CM

## 2021-08-13 DIAGNOSIS — M6281 Muscle weakness (generalized): Secondary | ICD-10-CM

## 2021-08-13 DIAGNOSIS — S88112A Complete traumatic amputation at level between knee and ankle, left lower leg, initial encounter: Secondary | ICD-10-CM
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
## 2021-08-16 ENCOUNTER — Ambulatory Visit: Payer: Medicare Other | Admitting: Physical Therapy

## 2021-08-16 ENCOUNTER — Encounter: Payer: Self-pay | Admitting: Physical Therapy

## 2021-08-16 ENCOUNTER — Telehealth: Payer: Self-pay | Admitting: Pharmacist

## 2021-08-16 ENCOUNTER — Ambulatory Visit: Payer: Medicare Other | Admitting: Family

## 2021-08-16 DIAGNOSIS — M6281 Muscle weakness (generalized): Secondary | ICD-10-CM

## 2021-08-16 DIAGNOSIS — R2689 Other abnormalities of gait and mobility: Secondary | ICD-10-CM

## 2021-08-16 NOTE — Telephone Encounter (Signed)
Patient contacted for follow/up of tobacco cessation attempt.   Since last contact patient reports he has been intermittently smoking 1 or no cigarettes each day.  Today he shared that he believes he is done.  He has not smoked today and is motivated to remain quit.   Medications currently being used;  Nicotine patch - continues 14mg  daily  Patient denies any significant side effects from tobacco cessation therapy.   Rates IMPORTANCE of quitting tobacco remains high Rates CONFIDENCE of quitting tobacco is high.  Most common triggers to use tobacco include; boredom and stress - some habit with other smokers around him is especially hard.    Total time with patient call and documentation of interaction: 11 minutes.  F/U Phone call planned: 2-3 weeks Consider taper plan to 7mg  if he can remain smoke free for > 10 days straight.

## 2021-08-16 NOTE — Therapy (Signed)
OUTPATIENT PHYSICAL THERAPY TREATMENT NOTE   Patient Name: Kristopher Ruiz. MRN: [id number] DOB:[birth year], 71 y.o., male Today's Date: 08/16/2021  PCP: Gifford Shave, MD REFERRING PROVIDER: Dr. Sharol Given    08/16/21 1404  PT Visits / Re-Eval  Visit Number 15  Number of Visits 24  Date for PT Re-Evaluation 09/06/21  Authorization  Authorization Type UHC Medicare/medicaid; Elizabeth Sauer 3/78/58; recert on 8/50/27  Progress Note Due on Visit 18  PT Time Calculation  PT Start Time 1402  PT Stop Time 1442  PT Time Calculation (min) 40 min  PT - End of Session  Equipment Utilized During Treatment Gait belt  Activity Tolerance Patient tolerated treatment well  Behavior During Therapy WFL for tasks assessed/performed      Past Medical History:  Diagnosis Date   Arthritis    Cataract    NS OU   Hypertension    Stroke [facility])    Past Surgical History:  Procedure Laterality Date   HIP FRACTURE SURGERY     LEG AMPUTATION BELOW KNEE     left leg   TOTAL HIP ARTHROPLASTY Left 10/31/2019   TOTAL HIP ARTHROPLASTY Left 10/31/2019   Procedure: LEFT TOTAL HIP ARTHROPLASTY, POSTERIOR;  Surgeon: Leandrew Koyanagi, MD;  Location: [place];  Service: Orthopedics;  Laterality: Left;   Patient Active Problem List   Diagnosis Date Noted   Anemia 07/06/2020   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   Closed displaced fracture of left femoral neck with delayed healing 09/28/2019   Establishing care with new doctor, encounter for 06/01/2019   TIA (transient ischemic attack) 09/13/2018   Cerebellar stroke ([place]) 09/06/2018   Hypoglycemia due to insulin 09/06/2018   Tobacco abuse 09/06/2018   Hyperlipidemia 09/06/2018   Essential hypertension 09/06/2018   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[place]) 04/16/2017    REFERRING DIAG: R BKA   THERAPY DIAG:  Other abnormalities of gait and mobility  Muscle weakness (generalized)  PERTINENT HISTORY: Hx of L BKA, CVA, HTN   PRECAUTIONS:  Fall   SUBJECTIVE: Missed his appointment this morning due to no transportation. Forgot to call Dr. Jess Barters office to let them know. States he will call them to reschedule . No falls or pain to report.   PAIN:  Are you having pain? No    TODAY'S TREATMENT:  08/16/2021 PROSTHETICS Prosthetic care comments:  No issues.  Donning prosthesis: Modified independence Doffing prosthesis: Modified independence Prosthetic wear tolerance: all awake  hours/day, 7 days/week Prosthetic weight bearing tolerance:  Residual limb condition: pt denies any issues with skin  STRENGTHENING   Scifit UE/LE level 3.5  x 5 minutes with goal >/=65 steps per minute for strengthening and activity tolerance.  Step ups with 4 inch box: alternating LE's with contralateral march for 10 reps each side with bil UE support on bars, min guard assist for safety.    GAIT: Gait pattern: step through pattern, decreased step length- Right, and ataxic Distance walked: around clinic with session Assistive device utilized: Walker - 2 wheeled and prosthesis Level of assistance:  min guard to min assist  Comments: Verbal cues for upright posture and to engage core with gait.  Cues to slow down so to focus on bil step placement and right shoulder alignment over hip/pelvis to assist with right step placement.   BALANCE/NMR: Green band resistance: forward/backward walking in parallel bars for 3 laps each way with UE support, min guard assist for safety with cues on posture and step length. Then side  stepping left<>right against resistance for 3 laps toward each way with UE support, cues on posture/step length.  Rockerboard: in anterior/posterior direction- alternating UE raises progressing to bil UE raises for 5-6 reps each, then no UE support for EO head movement left<>right, up<>down with min guard to min assist for balance. Cues on posture and weight shifting to assist with balance.          PATIENT EDUCATION: Education  details: continue with current HEP Person educated: Patient Education method: Customer service manager Education comprehension: verbalized understanding     HOME EXERCISE PROGRAM: Access Code: [email or web address] URL: [email or web address] Date: 06/19/2021 Prepared by: Willow Ora  Exercises Supine Bridge with Arms over Chest - 1 x daily - 5 x weekly - 1 sets - 10 reps Hooklying Clamshell with Resistance - 1 x daily - 5 x weekly - 1 sets - 10 reps Supine March with Resistance Band - 1 x daily - 5 x weekly - 1 sets - 10 reps Sit to Stand with Counter Support - 1 x daily - 5 x weekly - 1 sets - 10 reps Wide Stance with Counter Support  - 1 x daily - 7 x weekly - 2 sets - 2 reps Side Stepping with Counter Support  - 1 x daily - 7 x weekly - 1 sets - 4 reps    PT Short Term Goals All STG to be met by 08/09/2021       PT SHORT TERM GOAL #1   Title Pt will be able to perform TUG <45 seconds with RW and CGA to improve functional mobility    Baseline 08/09/21: 51.47 seconds with RW, improved balance with only min guard assist needed, increased time with less assistance    Time    Period    Status PARTIALLY MET   Target Date      PT SHORT TERM GOAL #2   Title Pt will be able to ambulate 20' with RW with SBA to improve household ambulation    Baseline 08/09/21: continues to need min assist at times, mostly min guard assist   Time    Period    Status PARTIALLY MET   Target Date       PT SHORT TERM GOAL #3   Title Pt will be able to go up and down steps with 1 rail for UE and SBA to improve safety    Baseline 08/09/21: continues to need bil rails with min guard assist for safety   Time    Period    Status NOT MET   Target Date              PT Long Term Goals - all LTG to be met by 09/06/2021       PT LONG TERM GOAL #1   Title Pt will be able to perform 5x sit to stand with bil UE support and without UE support when standing and in <20 seconds to improve functional  strength with transfers    Baseline 07/12/21: 19 seconds but very poor eccentric control and pt braces knees against table, improved just not to goal   Time 8    Period Weeks    Status Partially met, continue goal         PT LONG TERM GOAL #2   Title Patient will demo >32/56 on BBS to improve functional standing balance and reduce fall risk    Baseline 07/12/21:  26/56    Time 8    Period Weeks  Status Revised,  continue goal         PT LONG TERM GOAL #3   Title Pt will demo >230' with RW and SBA to improve functional ambulation within home    Baseline 07/12/21: 115' with RW and min A, improved just not to goal   Time 8    Period Weeks    Status Progressing,  continue goal         PT LONG TERM GOAL #4   Title Pt will be I and compliant with HEP to self manage symptoms    Baseline  07/12/21: has program, will benefit from updating as progresses   Time 8    Period Weeks    Status Progressing,  continue goal   Target Date              Plan     Clinical Impression Statement Skilled session focused on strengthening and balance training with short rest breaks at times due to fatigue. No other issues noted or reported in session.    Personal Factors and Comorbidities Age;Comorbidity 2    Comorbidities Hx of L BKA, CVA, HTN    Examination-Activity Limitations Caring for Others;Carry;Squat;Stairs;Stand;Transfers    Examination-Participation Restrictions Church;Cleaning;Community Activity;Meal Prep    Stability/Clinical Decision Making Stable/Uncomplicated    Rehab Potential Good    PT Frequency 2x / week    PT Duration 8 weeks   PT Treatment/Interventions ADLs/Self Care Home Management;Cryotherapy;Electrical Stimulation;Moist Heat;Gait training;Stair training;Functional mobility training;Therapeutic activities;Therapeutic exercise;Balance training;Manual techniques;Wheelchair mobility training;Prosthetic Training;Orthotic Fit/Training;Patient/family education;Neuromuscular  re-education;Passive range of motion;Energy conservation;Joint Manipulations    PT Next Visit Plan  Work on engaging core and slowing down movements for more control due to ataxia. balance training with decreased UE support. Try tall kneeling activities, seated on balance disc, continue to work on turning and balance, reaching with R UE and R LE   Consulted and Agree with Plan of Care Patient             Willow Ora, PTA, [place] [address], [place], [place] [address] [phone] 08/16/21, 2:05 PM

## 2021-08-16 NOTE — Telephone Encounter (Signed)
-----   Message from Kathrin Ruddy, RPH-CPP sent at 07/31/2021 11:13 AM EDT ----- Regarding: Tobacco Cessation - Quit ?   Was smoking only 1 per day. still using patch?

## 2021-08-19 ENCOUNTER — Encounter: Payer: Self-pay | Admitting: Rehabilitation

## 2021-08-19 ENCOUNTER — Ambulatory Visit: Payer: Medicare Other | Admitting: Rehabilitation

## 2021-08-19 DIAGNOSIS — S88112A Complete traumatic amputation at level between knee and ankle, left lower leg, initial encounter: Secondary | ICD-10-CM

## 2021-08-19 DIAGNOSIS — M6281 Muscle weakness (generalized): Secondary | ICD-10-CM

## 2021-08-19 DIAGNOSIS — R2689 Other abnormalities of gait and mobility: Secondary | ICD-10-CM | POA: Diagnosis not present

## 2021-08-19 NOTE — Therapy (Signed)
OUTPATIENT PHYSICAL THERAPY TREATMENT NOTE   Patient Name: Kristopher Ruiz. MRN: [id number] DOB:01-20-1951, 71 y.o., male 89 Date: 08/19/2021  PCP: Gifford Shave, MD REFERRING PROVIDER: Dr. Sharol Given    PT End of Session - 08/19/21 1324     Visit Number 16    Number of Visits 24    Date for PT Re-Evaluation 09/06/21    Authorization Type UHC Medicare/medicaid; Elizabeth Sauer 06/06/63; recert on 7/84/69    Progress Note Due on Visit 18    PT Start Time 6295    PT Stop Time 1400    PT Time Calculation (min) 43 min    Equipment Utilized During Treatment Gait belt    Activity Tolerance Patient tolerated treatment well    Behavior During Therapy WFL for tasks assessed/performed                Past Medical History:  Diagnosis Date   Arthritis    Cataract    NS OU   Hypertension    Stroke [facility])    Past Surgical History:  Procedure Laterality Date   HIP FRACTURE SURGERY     LEG AMPUTATION BELOW KNEE     left leg   TOTAL HIP ARTHROPLASTY Left 10/31/2019   TOTAL HIP ARTHROPLASTY Left 10/31/2019   Procedure: LEFT TOTAL HIP ARTHROPLASTY, POSTERIOR;  Surgeon: Leandrew Koyanagi, MD;  Location: [place];  Service: Orthopedics;  Laterality: Left;   Patient Active Problem List   Diagnosis Date Noted   Anemia 07/06/2020   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   Closed displaced fracture of left femoral neck with delayed healing 09/28/2019   Establishing care with new doctor, encounter for 06/01/2019   TIA (transient ischemic attack) 09/13/2018   Cerebellar stroke ([place]) 09/06/2018   Hypoglycemia due to insulin 09/06/2018   Tobacco abuse 09/06/2018   Hyperlipidemia 09/06/2018   Essential hypertension 09/06/2018   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[place]) 04/16/2017    REFERRING DIAG: R BKA   THERAPY DIAG:  Other abnormalities of gait and mobility  Muscle weakness (generalized)  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[facility])  PERTINENT HISTORY: Hx of L  BKA, CVA, HTN   PRECAUTIONS: Fall   SUBJECTIVE: He has not called Dr. Jess Barters office to reschedule visit from Friday.  Continue to educate that he needs to do this for face to face to get w/c.   PAIN:  Are you having pain? No    TODAY'S TREATMENT:  08/16/2021 PROSTHETICS Prosthetic care comments:  No issues.  Donning prosthesis: Modified independence Doffing prosthesis: Modified independence Prosthetic wear tolerance: all awake  hours/day, 7 days/week Prosthetic weight bearing tolerance:  Residual limb condition: pt denies any issues with skin  STRENGTHENING    GAIT: Gait pattern: step through pattern, decreased step length- Right, and ataxic Distance walked: around clinic with session Assistive device utilized: Walker - 2 wheeled and prosthesis Level of assistance:  min guard   Comments: Continue to provide cues for posture throughout.  Utilized theraband on RW legs as tie was hanging on the R side for visual target.  This worked really well having pt aim R LE for this target with each step.  Cues for continued movement, but overall did very well.    BALANCE/NMR: Standing cone taps with HHA on pts R.  Pt requires mod to max A at times when lifting RLE.  Performed x 20 reps.  This was somewhat difficult therefore had him tap small balance bubble to have lower  target.  Performed R LE only x 15 reps with good improvement in coordination but still with heavy assist in RUE from PT.  Transitioned to tall kneeling to work on improved proximal hip and trunk control. While in tall kneel, without UE support, holding balance x 2 reps of 20 secs>alt UE flexion without touching into between x 10 reps, R lateral steps x 10 reps with cues for slow and controlled movement, LLE for R LE stability x 10 reps without UE support and min A as needed.  Transitioned to quadruped alt LE extension x 10 reps with cues for slow controlled movement, esp when R LE in stabilizing position, then alt UE/LE x 10 reps  with tactile cues at pelvis/trunk for more stability.  Pt able to get into floor and back to mat with min A.          PATIENT EDUCATION: Education details: continue with current HEP Person educated: Patient Education method: Customer service manager Education comprehension: verbalized understanding     HOME EXERCISE PROGRAM: Access Code: [email or web address] URL: [email or web address] Date: 06/19/2021 Prepared by: Willow Ora  Exercises Supine Bridge with Arms over Chest - 1 x daily - 5 x weekly - 1 sets - 10 reps Hooklying Clamshell with Resistance - 1 x daily - 5 x weekly - 1 sets - 10 reps Supine March with Resistance Band - 1 x daily - 5 x weekly - 1 sets - 10 reps Sit to Stand with Counter Support - 1 x daily - 5 x weekly - 1 sets - 10 reps Wide Stance with Counter Support  - 1 x daily - 7 x weekly - 2 sets - 2 reps Side Stepping with Counter Support  - 1 x daily - 7 x weekly - 1 sets - 4 reps    PT Short Term Goals All STG to be met by 08/09/2021       PT SHORT TERM GOAL #1   Title Pt will be able to perform TUG <45 seconds with RW and CGA to improve functional mobility    Baseline 08/09/21: 51.47 seconds with RW, improved balance with only min guard assist needed, increased time with less assistance    Time    Period    Status PARTIALLY MET   Target Date      PT SHORT TERM GOAL #2   Title Pt will be able to ambulate 66' with RW with SBA to improve household ambulation    Baseline 08/09/21: continues to need min assist at times, mostly min guard assist   Time    Period    Status PARTIALLY MET   Target Date       PT SHORT TERM GOAL #3   Title Pt will be able to go up and down steps with 1 rail for UE and SBA to improve safety    Baseline 08/09/21: continues to need bil rails with min guard assist for safety   Time    Period    Status NOT MET   Target Date              PT Long Term Goals - all LTG to be met by 09/06/2021       PT LONG TERM  GOAL #1   Title Pt will be able to perform 5x sit to stand with bil UE support and without UE support when standing and in <20 seconds to improve functional strength with transfers    Baseline 07/12/21: 19 seconds but very  poor eccentric control and pt braces knees against table, improved just not to goal   Time 8    Period Weeks    Status Partially met, continue goal         PT LONG TERM GOAL #2   Title Patient will demo >32/56 on BBS to improve functional standing balance and reduce fall risk    Baseline 07/12/21:  26/56    Time 8    Period Weeks    Status Revised,  continue goal         PT LONG TERM GOAL #3   Title Pt will demo >230' with RW and SBA to improve functional ambulation within home    Baseline 07/12/21: 115' with RW and min A, improved just not to goal   Time 8    Period Weeks    Status Progressing,  continue goal         PT LONG TERM GOAL #4   Title Pt will be I and compliant with HEP to self manage symptoms    Baseline  07/12/21: has program, will benefit from updating as progresses   Time 8    Period Weeks    Status Progressing,  continue goal   Target Date              Plan     Clinical Impression Statement Skilled session focused on NMR for improved RLE coordination and proximal control in both open and closed chain activities.  Pt making great progress in session.    Personal Factors and Comorbidities Age;Comorbidity 2    Comorbidities Hx of L BKA, CVA, HTN    Examination-Activity Limitations Caring for Others;Carry;Squat;Stairs;Stand;Transfers    Examination-Participation Restrictions Church;Cleaning;Community Activity;Meal Prep    Stability/Clinical Decision Making Stable/Uncomplicated    Rehab Potential Good    PT Frequency 2x / week    PT Duration 8 weeks   PT Treatment/Interventions ADLs/Self Care Home Management;Cryotherapy;Electrical Stimulation;Moist Heat;Gait training;Stair training;Functional mobility training;Therapeutic  activities;Therapeutic exercise;Balance training;Manual techniques;Wheelchair mobility training;Prosthetic Training;Orthotic Fit/Training;Patient/family education;Neuromuscular re-education;Passive range of motion;Energy conservation;Joint Manipulations    PT Next Visit Plan  Work on engaging core and slowing down movements for more control due to ataxia. balance training with decreased UE support. Try tall kneeling activities, seated on balance disc, continue to work on turning and balance, reaching with R UE and R LE   Consulted and Agree with Plan of Care Patient             Cameron Sprang, PT, MPT [facility] [address] [place], [place], [address] Phone: [phone]   Fax:  [phone] 08/19/21, 1:25 PM

## 2021-08-20 ENCOUNTER — Encounter: Payer: Medicare Other | Admitting: Rehabilitation

## 2021-08-23 ENCOUNTER — Ambulatory Visit: Payer: Medicare Other | Admitting: Physical Therapy

## 2021-08-23 ENCOUNTER — Encounter: Payer: Self-pay | Admitting: Physical Therapy

## 2021-08-23 DIAGNOSIS — R2689 Other abnormalities of gait and mobility: Secondary | ICD-10-CM

## 2021-08-23 DIAGNOSIS — M6281 Muscle weakness (generalized): Secondary | ICD-10-CM

## 2021-08-23 NOTE — Therapy (Signed)
OUTPATIENT PHYSICAL THERAPY TREATMENT NOTE   Patient Name: Kristopher Ruiz. MRN: [id number] DOB:02-13-1951, 71 y.o., male 66 Date: 08/23/2021  PCP: Gifford Shave, MD REFERRING PROVIDER: Dr. Sharol Given    PT End of Session - 08/23/21 1322     Visit Number 17    Number of Visits 24    Date for PT Re-Evaluation 09/06/21    Authorization Type UHC Medicare/medicaid; Elizabeth Sauer 6/83/41; recert on 9/62/22    Progress Note Due on Visit 18    PT Start Time 1320    PT Stop Time 1400    PT Time Calculation (min) 40 min    Equipment Utilized During Treatment Gait belt    Activity Tolerance Patient tolerated treatment well    Behavior During Therapy WFL for tasks assessed/performed                Past Medical History:  Diagnosis Date   Arthritis    Cataract    NS OU   Hypertension    Stroke 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   Closed displaced fracture of left femoral neck with delayed healing 09/28/2019   Establishing care with new doctor, encounter for 06/01/2019   TIA (transient ischemic attack) 09/13/2018   Cerebellar stroke ([place]) 09/06/2018   Hypoglycemia due to insulin 09/06/2018   Tobacco abuse 09/06/2018   Hyperlipidemia 09/06/2018   Essential hypertension 09/06/2018   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 Jess Barters office twice to set up appt, had to leave a message both times. Has not heard back from them as yet. No falls or pain. Tried to do the ex's on the floor like last session, unable to get one leg to move like it should.   PAIN:  Are you having pain? No    TODAY'S TREATMENT:  08/23/2021 PROSTHETICS Prosthetic care comments:  No issues.  Donning prosthesis: Modified independence Doffing prosthesis: Modified independence Prosthetic wear tolerance: all awake  hours/day, 7 days/week Prosthetic weight bearing tolerance:  Residual limb condition: pt denies any issues with skin    GAIT: Gait pattern: step through pattern, decreased step length- Right, and ataxic Distance walked: 115 x 2, plus around clinic with session Assistive device utilized: Walker - 2 wheeled and prosthesis Level of assistance:  min guard   Comments: continued with use of band along bottom of walker with pt stepping right foot to red band and left foot to yellow band (both tied to a blue band that is anchored to the legs of the walker). Continues to need cues to slow down and focus on step placement.    BALANCE/NMR: Alternating Step Taps: with 2 foam bubbles on floor, left HHA (heavy mod assist) for alternating foot taps forward to each one with cues  for slow/controlled movements, to ensure weight is shifting prior to tapping bubble, posture and stance position upon return of foot from tapping.  Stepping Strategy: 4 square stepping with bil UE support on PTA arms, PTA assisting for balance/weight shifting at belt near pelvis (mod/max assist for balance) for 3 laps each way with seated rest break between direction change. Cues needed on posture, weight shifting and step placement.      Static standing balance: Surface: Floor Position: Feet Hip Width Apart Completed with: Eyes Open and Eyes Closed; EC for 30 seconds x 3 reps, then EO for Head Turns x 10 Reps and Head Nods x 10 Reps. Min guard to min  assist for balance with cues on posture to assist with balance.       PATIENT EDUCATION: Education details: continue with current HEP Person educated: Patient Education method: Customer service manager Education comprehension: verbalized understanding     HOME EXERCISE PROGRAM: Access Code: [email or web address] URL: [email or web address] Date: 06/19/2021 Prepared by: Willow Ora  Exercises Supine Bridge with Arms over Chest - 1 x daily - 5 x weekly - 1 sets - 10 reps Hooklying Clamshell with Resistance - 1 x daily - 5 x weekly - 1 sets - 10 reps Supine March with Resistance Band - 1 x daily - 5 x weekly - 1 sets - 10 reps Sit to Stand with Counter Support - 1 x daily - 5 x weekly - 1 sets - 10 reps Wide Stance with Counter Support  - 1 x daily - 7 x weekly - 2 sets - 2 reps Side Stepping with Counter Support  - 1 x daily - 7 x weekly - 1 sets - 4 reps    PT Short Term Goals All STG to be met by 08/09/2021       PT SHORT TERM GOAL #1   Title Pt will be able to perform TUG <45 seconds with RW and CGA to improve functional mobility    Baseline 08/09/21: 51.47 seconds with RW, improved balance with only min guard assist needed, increased time with less assistance    Time    Period    Status PARTIALLY MET   Target Date      PT SHORT TERM GOAL #2   Title Pt will be able to ambulate 37' with RW with SBA to improve household ambulation    Baseline 08/09/21: continues to need min assist at times, mostly min guard assist   Time    Period    Status PARTIALLY MET   Target Date       PT SHORT TERM GOAL #3   Title Pt will be able to go up and down steps with 1 rail for UE and SBA to improve safety    Baseline 08/09/21: continues to need bil rails with min guard assist for safety   Time    Period    Status NOT MET   Target Date              PT Long Term Goals - all LTG to be met by 09/06/2021       PT LONG TERM GOAL #1   Title Pt will be able to perform 5x sit  to stand with bil UE support and without UE support when standing and in <20 seconds to improve functional strength with transfers    Baseline 07/12/21: 19 seconds but very poor eccentric control and pt braces knees against table, improved just not to goal   Time  8    Period Weeks    Status Partially met, continue goal         PT LONG TERM GOAL #2   Title Patient will demo >32/56 on BBS to improve functional standing balance and reduce fall risk    Baseline 07/12/21:  26/56    Time 8    Period Weeks    Status Revised,  continue goal         PT LONG TERM GOAL #3   Title Pt will demo >230' with RW and SBA to improve functional ambulation within home    Baseline 07/12/21: 115' with RW and min A, improved just not to goal   Time 8    Period Weeks    Status Progressing,  continue goal         PT LONG TERM GOAL #4   Title Pt will be I and compliant with HEP to self manage symptoms    Baseline  07/12/21: has program, will benefit from updating as progresses   Time 8    Period Weeks    Status Progressing,  continue goal   Target Date              Plan     Clinical Impression Statement Skilled session focused on gait and balance with increased assistance needed with standing balance activities. Pt continues to demo improved step placement with use of visual band on walker. The pt is progressing and should  benefit from continued PT to progress toward unmet goals.    Personal Factors and Comorbidities Age;Comorbidity 2    Comorbidities Hx of L BKA, CVA, HTN    Examination-Activity Limitations Caring for Others;Carry;Squat;Stairs;Stand;Transfers    Examination-Participation Restrictions Church;Cleaning;Community Activity;Meal Prep    Stability/Clinical Decision Making Stable/Uncomplicated    Rehab Potential Good    PT Frequency 2x / week    PT Duration 8 weeks   PT Treatment/Interventions ADLs/Self Care Home Management;Cryotherapy;Electrical Stimulation;Moist Heat;Gait training;Stair  training;Functional mobility training;Therapeutic activities;Therapeutic exercise;Balance training;Manual techniques;Wheelchair mobility training;Prosthetic Training;Orthotic Fit/Training;Patient/family education;Neuromuscular re-education;Passive range of motion;Energy conservation;Joint Manipulations    PT Next Visit Plan  Work on engaging core and slowing down movements for more control due to ataxia. balance training with decreased UE support. Try tall kneeling activities, seated on balance disc, continue to work on turning and balance, reaching with R UE and R LE   Consulted and Agree with Plan of Care Patient            Willow Ora, PTA, [place] [address], [place], [place] [address] [phone] 08/23/21, 3:37 PM

## 2021-08-27 ENCOUNTER — Telehealth: Payer: Self-pay

## 2021-08-27 ENCOUNTER — Ambulatory Visit: Payer: Medicare Other

## 2021-08-27 NOTE — Telephone Encounter (Signed)
Patient Name: Kristopher Ruiz. MRN: [id number] DOB:1950-08-09, 71 y.o., male Today's Date: 08/27/2021  Spoke to patient reminding him that he missed his PT appt today at 1:15pm. Reminded him of his next follow up appt on 08/30/21 at 1:15pm.   Kerrie Pleasure, PT 08/27/2021, 2:25 PM

## 2021-08-30 ENCOUNTER — Encounter: Payer: Self-pay | Admitting: Physical Therapy

## 2021-08-30 ENCOUNTER — Ambulatory Visit: Payer: Medicare Other | Attending: Orthopedic Surgery | Admitting: Physical Therapy

## 2021-08-30 DIAGNOSIS — S88112A Complete traumatic amputation at level between knee and ankle, left lower leg, initial encounter: Secondary | ICD-10-CM | POA: Insufficient documentation

## 2021-08-30 DIAGNOSIS — R278 Other lack of coordination: Secondary | ICD-10-CM | POA: Diagnosis present

## 2021-08-30 DIAGNOSIS — R2689 Other abnormalities of gait and mobility: Secondary | ICD-10-CM | POA: Insufficient documentation

## 2021-08-30 DIAGNOSIS — M6281 Muscle weakness (generalized): Secondary | ICD-10-CM | POA: Insufficient documentation

## 2021-08-30 NOTE — Therapy (Signed)
OUTPATIENT PHYSICAL THERAPY TREATMENT NOTE   Patient Name: Kristopher Ruiz. MRN: [id number] DOB:1950-08-19, 71 y.o., male Today's Date: 08/30/2021  PCP: Gifford Shave, MD REFERRING PROVIDER: Dr. Sharol Given     08/30/21 1322  PT Visits / Re-Eval  Visit Number 18  Number of Visits 24  Date for PT Re-Evaluation 09/06/21  Authorization  Authorization Type [employer]HC Medicare/medicaid; Elizabeth Sauer 6/76/72; recert on 0/94/70  Progress Note Due on Visit 18  PT Time Calculation  PT Start Time 1318  PT Stop Time 1400  PT Time Calculation (min) 42 min  PT - End of Session  Equipment Utilized During Treatment Gait belt  Activity Tolerance Patient tolerated treatment well  Behavior During Therapy WFL for tasks assessed/performed        Past Medical History:  Diagnosis Date   Arthritis    Cataract    NS OU   Hypertension    Stroke [facility])    Past Surgical History:  Procedure Laterality Date   HIP FRACTURE SURGERY     LEG AMPUTATION BELOW KNEE     left leg   TOTAL HIP ARTHROPLASTY Left 10/31/2019   TOTAL HIP ARTHROPLASTY Left 10/31/2019   Procedure: LEFT TOTAL HIP ARTHROPLASTY, POSTERIOR;  Surgeon: Leandrew Koyanagi, MD;  Location: [facility];  Service: Orthopedics;  Laterality: Left;   Patient Active Problem List   Diagnosis Date Noted   Anemia 07/06/2020   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   Closed displaced fracture of left femoral neck with delayed healing 09/28/2019   Establishing care with new doctor, encounter for 06/01/2019   TIA (transient ischemic attack) 09/13/2018   Cerebellar stroke ([place]) 09/06/2018   Hypoglycemia due to insulin 09/06/2018   Tobacco abuse 09/06/2018   Hyperlipidemia 09/06/2018   Essential hypertension 09/06/2018   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[place]) 04/16/2017    REFERRING DIAG: R BKA   THERAPY DIAG:  Other abnormalities of gait and mobility  Muscle weakness (generalized)      PERTINENT HISTORY: Hx of L BKA, CVA, HTN    PRECAUTIONS: Fall   SUBJECTIVE: He has an appt next Friday with Erin at Dr. Jess Barters office. No new complaints. No falls.   PAIN:  Are you having pain? No    TODAY'S TREATMENT:  08/23/2021 PROSTHETICS Prosthetic care comments:  No issues.  Donning prosthesis: Modified independence Doffing prosthesis: Modified independence Prosthetic wear tolerance: all awake  hours/day, 7 days/week Prosthetic weight bearing tolerance:  Residual limb condition: pt denies any issues with skin    GAIT: Gait pattern: step through pattern, decreased step length- Right, and ataxic Distance walked: 230 feet x 1 Assistive device utilized: Walker - 2 wheeled and prosthesis Level of assistance:  min guard assist  Comments: continued with use of band along bottom of walker with pt stepping right foot to red band and left foot to yellow band (both tied to a blue band that is anchored to the legs of the walker). Continues to need cues to slow down and focus on step placement.    BALANCE/NMR: Seated on green air disc:   - with dowel rod with 2# cuff weight- UE raises, then upper trunk rotation left<>right with assist to knee feet on floor, cues to slow down for more controlled movements  - with light fingertip support on mat table: LE alternating long arc quads, then alternating marching for 10 rep each/each side with cues to keep tall posture/not lean back and for slow/controlled movements.   - with red  band: rows x 10 reps, then alternating 3 way pulls for 8 reps each way. Min assist to keep feet on floor and for balance/stability.       PATIENT EDUCATION: Education details: continue with current HEP Person educated: Patient Education method: Customer service manager Education comprehension: verbalized understanding     HOME EXERCISE PROGRAM: Access Code: [email or web address] URL: [email or web address] Date: 06/19/2021 Prepared by: Willow Ora  Exercises Supine Bridge with Arms over  Chest - 1 x daily - 5 x weekly - 1 sets - 10 reps Hooklying Clamshell with Resistance - 1 x daily - 5 x weekly - 1 sets - 10 reps Supine March with Resistance Band - 1 x daily - 5 x weekly - 1 sets - 10 reps Sit to Stand with Counter Support - 1 x daily - 5 x weekly - 1 sets - 10 reps Wide Stance with Counter Support  - 1 x daily - 7 x weekly - 2 sets - 2 reps Side Stepping with Counter Support  - 1 x daily - 7 x weekly - 1 sets - 4 reps    PT Short Term Goals All STG to be met by 08/09/2021       PT SHORT TERM GOAL #1   Title Pt will be able to perform TUG <45 seconds with RW and CGA to improve functional mobility    Baseline 08/09/21: 51.47 seconds with RW, improved balance with only min guard assist needed, increased time with less assistance    Time    Period    Status PARTIALLY MET   Target Date      PT SHORT TERM GOAL #2   Title Pt will be able to ambulate 57' with RW with SBA to improve household ambulation    Baseline 08/09/21: continues to need min assist at times, mostly min guard assist   Time    Period    Status PARTIALLY MET   Target Date       PT SHORT TERM GOAL #3   Title Pt will be able to go up and down steps with 1 rail for UE and SBA to improve safety    Baseline 08/09/21: continues to need bil rails with min guard assist for safety   Time    Period    Status NOT MET   Target Date              PT Long Term Goals - all LTG to be met by 09/06/2021       PT LONG TERM GOAL #1   Title Pt will be able to perform 5x sit to stand with bil UE support and without UE support when standing and in <20 seconds to improve functional strength with transfers    Baseline 07/12/21: 19 seconds but very poor eccentric control and pt braces knees against table, improved just not to goal   Time 8    Period Weeks    Status Partially met, continue goal         PT LONG TERM GOAL #2   Title Patient will demo >32/56 on BBS to improve functional standing balance and reduce fall  risk    Baseline 07/12/21:  26/56    Time 8    Period Weeks    Status Revised,  continue goal         PT LONG TERM GOAL #3   Title Pt will demo >230' with RW and SBA to improve functional ambulation within home  Baseline 07/12/21: 115' with RW and min A, improved just not to goal   Time 8    Period Weeks    Status Progressing,  continue goal         PT LONG TERM GOAL #4   Title Pt will be I and compliant with HEP to self manage symptoms    Baseline  07/12/21: has program, will benefit from updating as progresses   Time 8    Period Weeks    Status Progressing,  continue goal   Target Date              Plan     Clinical Impression Statement Skilled session focused on gait and core/LE strengthening with rest breaks taken as needed. No other issues noted or reported in session. The pt is making steady progress toward goals and should benefit from continued PT to progress toward unmet goals.    Personal Factors and Comorbidities Age;Comorbidity 2    Comorbidities Hx of L BKA, CVA, HTN    Examination-Activity Limitations Caring for Others;Carry;Squat;Stairs;Stand;Transfers    Examination-Participation Restrictions Church;Cleaning;Community Activity;Meal Prep    Stability/Clinical Decision Making Stable/Uncomplicated    Rehab Potential Good    PT Frequency 2x / week    PT Duration 8 weeks   PT Treatment/Interventions ADLs/Self Care Home Management;Cryotherapy;Electrical Stimulation;Moist Heat;Gait training;Stair training;Functional mobility training;Therapeutic activities;Therapeutic exercise;Balance training;Manual techniques;Wheelchair mobility training;Prosthetic Training;Orthotic Fit/Training;Patient/family education;Neuromuscular re-education;Passive range of motion;Energy conservation;Joint Manipulations    PT Next Visit Plan  Check LTG's for anticipated recert. Continue to work on gait with walker, balance and coordination.    Consulted and Agree with Plan of Care Patient             Willow Ora, PTA, [place] [address] [place], [place] [address] [phone] 08/30/21, 1:23 PM

## 2021-09-03 ENCOUNTER — Encounter: Payer: Self-pay | Admitting: *Deleted

## 2021-09-03 ENCOUNTER — Ambulatory Visit: Payer: Medicare Other | Admitting: Rehabilitation

## 2021-09-03 ENCOUNTER — Encounter: Payer: Self-pay | Admitting: Rehabilitation

## 2021-09-03 DIAGNOSIS — R2689 Other abnormalities of gait and mobility: Secondary | ICD-10-CM

## 2021-09-03 DIAGNOSIS — M6281 Muscle weakness (generalized): Secondary | ICD-10-CM

## 2021-09-03 DIAGNOSIS — R278 Other lack of coordination: Secondary | ICD-10-CM

## 2021-09-03 NOTE — Therapy (Signed)
OUTPATIENT PHYSICAL THERAPY TREATMENT NOTE and RE-CERT    Patient Name: Kristopher Ruiz. MRN: [id number] DOB:1950-08-21, 71 y.o., male Today's Date: 09/03/2021  PCP: Gifford Shave, MD REFERRING PROVIDER: Dr. Sharol Given     PT End of Session - 09/03/21 1322     Visit Number 19    Number of Visits 40   per updated POC   Date for PT Re-Evaluation 11/02/21   per updated POC   Authorization Type [employer] Medicare/medicaid    Progress Note Due on Visit 28    PT Start Time 2263    PT Stop Time 1402    PT Time Calculation (min) 45 min    Equipment Utilized During Treatment Gait belt    Activity Tolerance Patient tolerated treatment well    Behavior During Therapy WFL for tasks assessed/performed                  Past Medical History:  Diagnosis Date   Arthritis    Cataract    NS OU   Hypertension    Stroke [facility])    Past Surgical History:  Procedure Laterality Date   HIP FRACTURE SURGERY     LEG AMPUTATION BELOW KNEE     left leg   TOTAL HIP ARTHROPLASTY Left 10/31/2019   TOTAL HIP ARTHROPLASTY Left 10/31/2019   Procedure: LEFT TOTAL HIP ARTHROPLASTY, POSTERIOR;  Surgeon: Leandrew Koyanagi, MD;  Location: [place];  Service: Orthopedics;  Laterality: Left;   Patient Active Problem List   Diagnosis Date Noted   Anemia 07/06/2020   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   Closed displaced fracture of left femoral neck with delayed healing 09/28/2019   Establishing care with new doctor, encounter for 06/01/2019   TIA (transient ischemic attack) 09/13/2018   Cerebellar stroke ([place]) 09/06/2018   Hypoglycemia due to insulin 09/06/2018   Tobacco abuse 09/06/2018   Hyperlipidemia 09/06/2018   Essential hypertension 09/06/2018   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   PAIN:  Are you having pain? No    TODAY'S TREATMENT:  09/03/21 PROSTHETICS Prosthetic care comments:  No issues.  Donning prosthesis: Modified independence Doffing prosthesis: Modified independence Prosthetic wear tolerance: all awake  hours/day, 7 days/week Prosthetic weight bearing tolerance:  Residual limb condition: pt denies any issues with skin    GAIT: Gait pattern: step through pattern, decreased step length- Right, and ataxic Distance walked: 230 feet x 1 Assistive device utilized: Walker - 2 wheeled and prosthesis Level of assistance:  SBA Comments: continued with use of band along bottom of walker with pt stepping right foot to red band and left foot to yellow band (both tied to a blue band that is anchored to the legs of the walker). Continues to need cues to slow down and focus on step placement.  Cues for relaxed posture and decreased UE support as able.  Pt very slow with gait, but demos great control throughout.    BALANCE/NMRHulan Fess Adult PT Treatment/Exercise - 09/03/21 1328       Standardized Balance Assessment   Standardized Balance Assessment Berg Balance Test      [place] Balance  Test   Sit to Stand Able to stand  independently using hands    Standing Unsupported Able to stand 2 minutes with supervision    Sitting with Back Unsupported but Feet Supported on Floor or Stool Able to sit safely and securely 2 minutes    Stand to Sit Controls descent by using hands    Transfers Needs one person to assist    Standing Unsupported with Eyes Closed Able to stand 3 seconds    Standing Ubsupported with Feet Together Needs help to attain position and unable to hold for 15 seconds    From Standing, Reach Forward with Outstretched Arm Can reach forward >5 cm safely (2")    From Standing Position, Pick up Object from Floor Able to pick up shoe, needs supervision    From Standing  Position, Turn to Look Behind Over each Shoulder Needs supervision when turning    Turn 360 Degrees Needs assistance while turning    Standing Unsupported, Alternately Place Feet on Step/Stool Able to complete >2 steps/needs minimal assist    Standing Unsupported, One Foot in Front Needs help to step but can hold 15 seconds    Standing on One Leg Unable to try or needs assist to prevent fall    Total Score 24            Pt remains a high fall risk due to RUE/LE and truncal ataxia with score of 24/56 on BERG balance test.     Therex:  Reviewed supine bridging and clam exercises for HEP.  Updated verbally to perform RLE only bridging x 10 reps and straight leg RLE abduction x 10 reps with cues for working on control of LE.  Will formally update on HEP next visit.       PATIENT EDUCATION: Education details: updated HEP and POC Person educated: Patient Education method: Customer service manager Education comprehension: verbalized understanding     HOME EXERCISE PROGRAM: Access Code: [email or web address] URL: [email or web address] Date: 06/19/2021 Prepared by: Willow Ora  Exercises Supine Bridge with Arms over Chest - 1 x daily - 5 x weekly - 1 sets - 10 reps Hooklying Clamshell with Resistance - 1 x daily - 5 x weekly - 1 sets - 10 reps Supine March with Resistance Band - 1 x daily - 5 x weekly - 1 sets - 10 reps Sit to Stand with Counter Support - 1 x daily - 5 x weekly - 1 sets - 10 reps Wide Stance with Counter Support  - 1 x daily - 7 x weekly - 2 sets - 2 reps Side Stepping with Counter Support  - 1 x daily - 7 x weekly - 1 sets - 4 reps    PT Short Term Goals All STG to be met by 08/09/2021       PT SHORT TERM GOAL #1   Title Pt will be able to perform TUG <45 seconds with RW and CGA to improve functional mobility    Baseline 08/09/21: 51.47 seconds with RW, improved balance with only min guard assist needed, increased time with less assistance    Time     Period    Status PARTIALLY MET   Target Date      PT SHORT TERM GOAL #2   Title Pt will be able to ambulate 40' with RW with SBA to improve household ambulation    Baseline 08/09/21: continues to need min assist at times, mostly min guard assist   Time    Period  Status PARTIALLY MET   Target Date       PT SHORT TERM GOAL #3   Title Pt will be able to go up and down steps with 1 rail for UE and SBA to improve safety    Baseline 08/09/21: continues to need bil rails with min guard assist for safety   Time    Period    Status NOT MET   Target Date              PT Long Term Goals - all LTG to be met by 09/06/2021       PT LONG TERM GOAL #1   Title Pt will be able to perform 5x sit to stand with bil UE support and without UE support when standing and in <20 seconds to improve functional strength with transfers    Baseline 09/03/21 24.12 secs with single UE support to RW, improved control, esp with repetition.    Time 8    Period Weeks    Status Partially met, continue goal         PT LONG TERM GOAL #2   Title Patient will demo >32/56 on BBS to improve functional standing balance and reduce fall risk    Baseline 09/03/21: 24/56   Time 8    Period Weeks    Status Progressing          PT LONG TERM GOAL #3   Title Pt will demo >230' with RW and SBA to improve functional ambulation within home    Baseline 09/03/21 met 230' with SBA and RW   Time 8    Period Weeks    Status met         PT LONG TERM GOAL #4   Title Pt will be I and compliant with HEP to self manage symptoms    Baseline  07/12/21: has program, will benefit from updating as progresses   Time 8    Period Weeks    Status Progressing,  continue goal   Target Date               PT Short Term Goals All NEW STG to be met by 10/01/2021       PT SHORT TERM GOAL #1   Title Pt will be able to perform TUG <45 seconds with RW and CGA to improve functional mobility    Baseline 08/09/21: 51.47 seconds with RW,  improved balance with only min guard assist needed, increased time with less assistance    Time    Period    Status Ongoing    Target Date      PT SHORT TERM GOAL #2   Title Pt will ambulate x 300' over indoor and outdoor unlevel paved surfaces at [facility] level in order to demo safety in home and community.    Baseline 08/09/21: continues to need min assist at times, mostly min guard assist   Time    Period    Status Revised    Target Date       PT SHORT TERM GOAL #3   Title Pt will be able to go up and down 4 steps with 1 rail for UE and SBA to improve safety    Baseline 08/09/21: continues to need bil rails with min guard assist for safety   Time    Period    Status Revised    Target Date    PT SHORT TERM GOAL #4 Pt will improve BERG balance goal to >/=  28/56 in order to indicate dec fall risk.  Baseline: 24/56 Status: New            PT Long Term Goals - all NEW LTG to be met by 10/29/2021       PT LONG TERM GOAL #1   Title Pt will be able to perform 5x sit to stand with bil UE support and without UE support when standing and in <20 seconds to improve functional strength with transfers    Baseline 09/03/21 24.12 secs with single UE support to RW, improved control, esp with repetition.    Time 8    Period Weeks    Status Ongoing          PT LONG TERM GOAL #2   Title Patient will demo >32/56 on BBS to improve functional standing balance and reduce fall risk    Baseline 09/03/21: 24/56   Time 8    Period Weeks    Status Progressing          PT LONG TERM GOAL #3   Title Pt will demo >500' with RW over unlevel outdoor surfaces, up/down inclines/ramp and up/down 4 steps with single UE support in alternating pattern in order to demo safety in community.    Baseline 09/03/21 met 230' with SBA and RW   Time 8    Period Weeks    Status Revised          PT LONG TERM GOAL #4   Title Pt will be I and compliant with HEP to self manage symptoms    Baseline  07/12/21: has program,  will benefit from updating as progresses   Time 8    Period Weeks    Status Progressing,  continue goal   Target Date    PT LONG TERM GOAL #5 Pt will improve TUG to </=40 secs with RW in order to indicate dec fall risk.  Baseline:51.47 secs with RW Status: New            Plan     Clinical Impression Statement Skilled session focused on assessment of LTGs for renewal for 8 more weeks of PT.  Pt is making progress with gait with RW and overall more controlled mobility.     Personal Factors and Comorbidities Age;Comorbidity 2    Comorbidities Hx of L BKA, CVA, HTN    Examination-Activity Limitations Caring for Others;Carry;Squat;Stairs;Stand;Transfers    Examination-Participation Restrictions Church;Cleaning;Community Activity;Meal Prep    Stability/Clinical Decision Making Stable/Uncomplicated    Rehab Potential Good    PT Frequency 2x / week    PT Duration 8 weeks   PT Treatment/Interventions ADLs/Self Care Home Management;Cryotherapy;Electrical Stimulation;Moist Heat;Gait training;Stair training;Functional mobility training;Therapeutic activities;Therapeutic exercise;Balance training;Manual techniques;Wheelchair mobility training;Prosthetic Training;Orthotic Fit/Training;Patient/family education;Neuromuscular re-education;Passive range of motion;Energy conservation;Joint Manipulations    PT Next Visit Plan Go over HEP and update.  Continue to work on gait with walker, balance and coordination.    Consulted and Agree with Plan of Care Patient            Cameron Sprang, PT, MPT [facility] [address] [place], [place], [address] Phone: [phone]   Fax:  [phone] 09/03/21, 2:35 PM

## 2021-09-05 ENCOUNTER — Encounter: Payer: Self-pay | Admitting: Pharmacist

## 2021-09-05 ENCOUNTER — Telehealth: Payer: Self-pay | Admitting: Pharmacist

## 2021-09-05 DIAGNOSIS — Z720 Tobacco use: Secondary | ICD-10-CM

## 2021-09-05 NOTE — Telephone Encounter (Signed)
Patient contacted for follow/up of tobacco cessation attempt.   Since last contact patient reports he has NOT smoked at all.  He also states he is no longer using any NRT patches.   He was congratulated on his complete abstinence from smoking.  Rates IMPORTANCE of quitting tobacco remains high.  Rates CONFIDENCE of quitting tobacco is high    Total time with patient call and documentation of interaction: 8 minutes.  F/U Phone call planned: 1 month

## 2021-09-05 NOTE — Telephone Encounter (Signed)
-----   Message from Leavy Cella, [place] sent at 08/16/2021 10:25 AM EDT ----- Regarding: Tobacco Cessation - QUIT 08/16/2021

## 2021-09-05 NOTE — Assessment & Plan Note (Signed)
Patient contacted for follow/up of tobacco cessation attempt.   Since last contact patient reports he has NOT smoked at all.  He also states he is no longer using any NRT patches.   He was congratulated on his complete abstinence from smoking.  Rates IMPORTANCE of quitting tobacco remains high.  Rates CONFIDENCE of quitting tobacco is high    Total time with patient call and documentation of interaction: 8 minutes.  F/U Phone call planned: 1 month    

## 2021-09-05 NOTE — Telephone Encounter (Signed)
Noted and agree. 

## 2021-09-06 ENCOUNTER — Telehealth: Payer: Self-pay

## 2021-09-06 ENCOUNTER — Encounter: Payer: Self-pay | Admitting: Family

## 2021-09-06 ENCOUNTER — Ambulatory Visit: Payer: Self-pay

## 2021-09-06 ENCOUNTER — Ambulatory Visit (INDEPENDENT_AMBULATORY_CARE_PROVIDER_SITE_OTHER): Payer: Medicare Other | Admitting: Family

## 2021-09-06 ENCOUNTER — Ambulatory Visit (INDEPENDENT_AMBULATORY_CARE_PROVIDER_SITE_OTHER): Payer: Medicare Other

## 2021-09-06 DIAGNOSIS — S72002G Fracture of unspecified part of neck of left femur, subsequent encounter for closed fracture with delayed healing: Secondary | ICD-10-CM

## 2021-09-06 DIAGNOSIS — M25552 Pain in left hip: Secondary | ICD-10-CM | POA: Diagnosis not present

## 2021-09-06 DIAGNOSIS — M5416 Radiculopathy, lumbar region: Secondary | ICD-10-CM

## 2021-09-06 MED ORDER — PREDNISONE 50 MG PO TABS: ORAL_TABLET | ORAL | 0 refills | Status: DC

## 2021-09-06 NOTE — Telephone Encounter (Signed)
Order sent to [employer] [facility] for wheelchair. Will monitor the dash board for approval.

## 2021-09-06 NOTE — Progress Notes (Signed)
Office Visit Note   Patient: Kristopher Ruiz.           Date of Birth: Aug 21, 1950           MRN: [id number] Visit Date: 09/06/2021              Requested by: Gifford Shave, MD [address],  [place] [address] PCP: Gifford Shave, MD  Chief Complaint  Patient presents with   Left Leg - Follow-up    HX BKA       HPI: The patient is a 71 year old gentleman who presents today for 2 separate issues.  He is seen status post left below-knee amputation he is currently working with [facility] for his prosthesis needs.  He today is requesting a self-propelled wheelchair due to weakness in the right lower extremity he has been attempting to use a walker in the home unfortunately he has poor balance he only has about 10 minutes of endurance and has difficulty navigating his home.  He would benefit from a self-propelled wheelchair  He is also having instability in his current prosthesis.  Concern for a poor fit due to volume loss.  Some instability in the foot and ankle of his prosthesis as well.  Today complaining of some right hip pain that radiates down his lateral buttock and hip down the thigh this is associated with numbness this has been ongoing now for many months gradually worsening difficulty sleeping due to pain.  He has had a similar episode about 1 year ago which resolved well with a prednisone taper.  He does have a history of IM nailing on the left he is concerned for issues with his left hip and thigh hardware  Assessment & Plan: Visit Diagnoses:  1. Closed displaced fracture of left femoral neck with delayed healing   2. Pain in left hip   3. Lumbar radiculopathy     Plan: Given an order for prosthetic evaluation of his left below-knee amputation prosthesis.  We will work on getting him set up with a self-propelled wheelchair.  Reassurance provided hardware is stable.  Lumbar radiculopathy: given a prednisone burst for his lumbar radiculopathy on the right.   We will follow-up with him as needed.  Follow-Up Instructions: No follow-ups on file.   Right Hip Exam   Tenderness  The patient is experiencing no tenderness.   Range of Motion  The patient has normal right hip ROM.  Tests  FABER: negative   Back Exam   Tenderness  The patient is experiencing no tenderness.   Tests  Straight leg raise right: positive      Patient is alert, oriented, no adenopathy, well-dressed, normal affect, normal respiratory effort. On examination of the left residual limb this is well consolidated well-healed no callus no impending skin breakdown  Imaging: No results found. No images are attached to the encounter.  Labs: Lab Results  Component Value Date   HGBA1C 6.0 07/06/2020   HGBA1C 6.4 (H) 09/14/2018   HGBA1C 6.2 (H) 09/07/2018     Lab Results  Component Value Date   ALBUMIN 3.9 10/31/2019   ALBUMIN 3.6 09/13/2018   PREALBUMIN 21.8 10/31/2019    No results found for: "MG" No results found for: "VD25OH"  Lab Results  Component Value Date   PREALBUMIN 21.8 10/31/2019      Latest Ref Rng & Units 07/06/2020   10:12 AM 11/01/2019    2:16 AM 10/31/2019    8:22 PM  CBC EXTENDED  WBC 3.4 - 10.8 x10E3/uL 7.2  10.6  12.0   RBC 4.14 - 5.80 x10E6/uL 4.58  4.08  4.40   Hemoglobin 13.0 - 17.7 g/dL 12.2  11.0  11.8   HCT 37.5 - 51.0 % 37.8  35.4  38.3   Platelets 150 - 450 x10E3/uL 280  228  238      There is no height or weight on file to calculate BMI.  Orders:  Orders Placed This Encounter  Procedures   XR Lumbar Spine 2-3 Views   XR HIP UNILAT W OR W/O PELVIS 1V RIGHT   Meds ordered this encounter  Medications   predniSONE (DELTASONE) 50 MG tablet    Sig: Take one tablet by mouth once daily for 5 days.    Dispense:  5 tablet    Refill:  0     Procedures: No procedures performed  Clinical Data: No additional findings.  ROS:  All other systems negative, except as noted in the HPI. Review of  Systems  Objective: Vital Signs: There were no vitals taken for this visit.  Specialty Comments:  No specialty comments available.  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   Closed displaced fracture of left femoral neck with delayed healing 09/28/2019   Establishing care with new doctor, encounter for 06/01/2019   TIA (transient ischemic attack) 09/13/2018   Cerebellar stroke ([place]) 09/06/2018   Hypoglycemia due to insulin 09/06/2018   Tobacco abuse 09/06/2018   Hyperlipidemia 09/06/2018   Essential hypertension 09/06/2018   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[place]) 04/16/2017   Past Medical History:  Diagnosis Date   Arthritis    Cataract    NS OU   Hypertension    Stroke ([place])     Family History  Problem Relation Age of Onset   Hypertension Mother    Hypertension Father     Past Surgical History:  Procedure Laterality Date   HIP FRACTURE SURGERY     LEG AMPUTATION BELOW KNEE     left leg   TOTAL HIP ARTHROPLASTY Left 10/31/2019   TOTAL HIP ARTHROPLASTY Left 10/31/2019   Procedure: LEFT TOTAL HIP ARTHROPLASTY, POSTERIOR;  Surgeon: Leandrew Koyanagi, MD;  Location: [place];  Service: Orthopedics;  Laterality: Left;   Social History   Occupational History   Occupation: Disability  Tobacco Use   Smoking status: Former    Types: Cigarettes    Quit date: 08/14/2021    Years since quitting: 0.0   Smokeless tobacco: Never   Tobacco comments:    Quit Spring 2023 ~ May  Vaping Use   Vaping Use: Never used  Substance and Sexual Activity   Alcohol use: No   Drug use: No   Sexual activity: Not Currently

## 2021-09-11 NOTE — Telephone Encounter (Signed)
Sent request for update to [employer] on request for wheelchair

## 2021-09-13 ENCOUNTER — Encounter: Payer: Self-pay | Admitting: Physical Therapy

## 2021-09-13 ENCOUNTER — Ambulatory Visit: Payer: Medicare Other | Admitting: Physical Therapy

## 2021-09-13 DIAGNOSIS — R2689 Other abnormalities of gait and mobility: Secondary | ICD-10-CM | POA: Diagnosis not present

## 2021-09-13 DIAGNOSIS — M6281 Muscle weakness (generalized): Secondary | ICD-10-CM

## 2021-09-13 DIAGNOSIS — R278 Other lack of coordination: Secondary | ICD-10-CM

## 2021-09-13 DIAGNOSIS — S88112A Complete traumatic amputation at level between knee and ankle, left lower leg, initial encounter: Secondary | ICD-10-CM

## 2021-09-13 NOTE — Therapy (Signed)
OUTPATIENT PHYSICAL THERAPY TREATMENT NOTE and RE-CERT    Patient Name: Kristopher Ruiz. MRN: [id number] DOB:30-Sep-1950, 71 y.o., male Today's Date: 09/13/2021  PCP: Concepcion Living, MD REFERRING PROVIDER: Dr. Sharol Given     PT End of Session - 09/13/21 1229     Visit Number 20    Number of Visits 40   per updated POC   Date for PT Re-Evaluation 11/02/21   per updated POC   Authorization Type [employer]HC Medicare/medicaid    Progress Note Due on Visit 28    PT Start Time 1220    PT Stop Time 1309    PT Time Calculation (min) 49 min    Equipment Utilized During Treatment Gait belt    Activity Tolerance Patient tolerated treatment well    Behavior During Therapy WFL for tasks assessed/performed                  Past Medical History:  Diagnosis Date   Arthritis    Cataract    NS OU   Hypertension    Stroke [facility])    Past Surgical History:  Procedure Laterality Date   HIP FRACTURE SURGERY     LEG AMPUTATION BELOW KNEE     left leg   TOTAL HIP ARTHROPLASTY Left 10/31/2019   TOTAL HIP ARTHROPLASTY Left 10/31/2019   Procedure: LEFT TOTAL HIP ARTHROPLASTY, POSTERIOR;  Surgeon: Leandrew Koyanagi, MD;  Location: [place];  Service: Orthopedics;  Laterality: Left;   Patient Active Problem List   Diagnosis Date Noted   Anemia 07/06/2020   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   Closed displaced fracture of left femoral neck with delayed healing 09/28/2019   Establishing care with new doctor, encounter for 06/01/2019   TIA (transient ischemic attack) 09/13/2018   Cerebellar stroke ([place]) 09/06/2018   Hypoglycemia due to insulin 09/06/2018   Tobacco abuse 09/06/2018   Hyperlipidemia 09/06/2018   Essential hypertension 09/06/2018   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[place]) 04/16/2017    REFERRING DIAG: R BKA   THERAPY DIAG:  Other abnormalities of gait and mobility  Muscle weakness (generalized)  Other lack of coordination  Below-knee amputation of left  lower extremity ([facility])      PERTINENT HISTORY: Hx of L BKA, CVA, HTN   PRECAUTIONS: Fall   SUBJECTIVE: Saw Dondra Prader recently for w/c Rx.  Is scheduled to go [facility] Monday 09/16/2021 for evaluation for new prosthetic.  PAIN:  Are you having pain? No  TODAY'S TREATMENT:  PROSTHETICS Prosthetic care comments:  No issues.  Donning prosthesis: Modified independence Doffing prosthesis: Modified independence Prosthetic wear tolerance: all awake hours/day, 7 days/week; pt states he sometimes take the prosthetic off when he is just resting during the day Prosthetic weight bearing tolerance: no reported issues Residual limb condition: pt denies any issues with skin    GAIT: Gait pattern: step through pattern, decreased step length- Right, and ataxic Distance walked: 230 feet x 1 Assistive device utilized: Walker - 2 wheeled and prosthesis Level of assistance:  SBA Comments: Min multimodal cues for improved R step placement as RLE becomes more ataxic w/ fatigue during second lap.  MinA for standing rest break to adjust posture on RW due to fatigue and inc posterior trunk sway due to ataxia.   THEREX: -Supine bridges x10 > RLE only bridges 2x8 -Supine march w/ blue theraband x20 -Side-lying hip abduction 2x20 LLE only -STS w/ band around thighs 2x6 -used blue theraband and cued for appropriate  hand placement and push to stand instead of pulling on RW -Wide stance w/ counter support x1 min -side-stepping at counter 3x8'       PATIENT EDUCATION: Education details: updated HEP and POC Person educated: Patient Education method: Explanation and Demonstration Education comprehension: verbalized understanding  HOME EXERCISE PROGRAM: Access Code: [email or web address] URL: [email or web address] Date: 09/13/2021 Prepared by: [place] with Resistance  - 1 x daily - 5 x weekly - 1 sets - 10 reps - Supine March with Resistance Band  - 1 x  daily - 5 x weekly - 2 sets - 10 reps - Wide Stance with Counter Support  - 1 x daily - 7 x weekly - 2 sets - 2 reps - Side Stepping with Counter Support  - 1 x daily - 7 x weekly - 1 sets - 4 reps - Single Leg Bridge  - 1 x daily - 5 x weekly - 2 sets - 8 reps - Sidelying Hip Abduction  - 1 x daily - 5 x weekly - 2 sets - 10 reps - Sit to Stand with Resistance Around Legs  - 1 x daily - 5 x weekly - 2 sets - 10 reps    PT Short Term Goals All STG to be met by 08/09/2021       PT SHORT TERM GOAL #1   Title Pt will be able to perform TUG <45 seconds with RW and CGA to improve functional mobility    Baseline 08/09/21: 51.47 seconds with RW, improved balance with only min guard assist needed, increased time with less assistance    Time    Period    Status PARTIALLY MET   Target Date      PT SHORT TERM GOAL #2   Title Pt will be able to ambulate 43' with RW with SBA to improve household ambulation    Baseline 08/09/21: continues to need min assist at times, mostly min guard assist   Time    Period    Status PARTIALLY MET   Target Date       PT SHORT TERM GOAL #3   Title Pt will be able to go up and down steps with 1 rail for UE and SBA to improve safety    Baseline 08/09/21: continues to need bil rails with min guard assist for safety   Time    Period    Status NOT MET   Target Date              PT Long Term Goals - all LTG to be met by 09/06/2021       PT LONG TERM GOAL #1   Title Pt will be able to perform 5x sit to stand with bil UE support and without UE support when standing and in <20 seconds to improve functional strength with transfers    Baseline 09/03/21 24.12 secs with single UE support to RW, improved control, esp with repetition.    Time 8    Period Weeks    Status Partially met, continue goal         PT LONG TERM GOAL #2   Title Patient will demo >32/56 on BBS to improve functional standing balance and reduce fall risk    Baseline 09/03/21: 24/56   Time 8     Period Weeks    Status Progressing          PT LONG TERM GOAL #3   Title Pt will demo >  64' with RW and SBA to improve functional ambulation within home    Baseline 09/03/21 met 230' with SBA and RW   Time 8    Period Weeks    Status met         PT LONG TERM GOAL #4   Title Pt will be I and compliant with HEP to self manage symptoms    Baseline  07/12/21: has program, will benefit from updating as progresses   Time 8    Period Weeks    Status Progressing,  continue goal   Target Date               PT Short Term Goals All NEW STG to be met by 10/01/2021       PT SHORT TERM GOAL #1   Title Pt will be able to perform TUG <45 seconds with RW and CGA to improve functional mobility    Baseline 08/09/21: 51.47 seconds with RW, improved balance with only min guard assist needed, increased time with less assistance    Time    Period    Status Ongoing    Target Date      PT SHORT TERM GOAL #2   Title Pt will ambulate x 300' over indoor and outdoor unlevel paved surfaces at [facility] level in order to demo safety in home and community.    Baseline 08/09/21: continues to need min assist at times, mostly min guard assist   Time    Period    Status Revised    Target Date       PT SHORT TERM GOAL #3   Title Pt will be able to go up and down 4 steps with 1 rail for UE and SBA to improve safety    Baseline 08/09/21: continues to need bil rails with min guard assist for safety   Time    Period    Status Revised    Target Date    PT SHORT TERM GOAL #4 Pt will improve BERG balance goal to >/=28/56 in order to indicate dec fall risk.  Baseline: 24/56 Status: New            PT Long Term Goals - all NEW LTG to be met by 10/29/2021       PT LONG TERM GOAL #1   Title Pt will be able to perform 5x sit to stand with bil UE support and without UE support when standing and in <20 seconds to improve functional strength with transfers    Baseline 09/03/21 24.12 secs with single UE support to  RW, improved control, esp with repetition.    Time 8    Period Weeks    Status Ongoing          PT LONG TERM GOAL #2   Title Patient will demo >32/56 on BBS to improve functional standing balance and reduce fall risk    Baseline 09/03/21: 24/56   Time 8    Period Weeks    Status Progressing          PT LONG TERM GOAL #3   Title Pt will demo >500' with RW over unlevel outdoor surfaces, up/down inclines/ramp and up/down 4 steps with single UE support in alternating pattern in order to demo safety in community.    Baseline 09/03/21 met 230' with SBA and RW   Time 8    Period Weeks    Status Revised          PT LONG TERM  GOAL #4   Title Pt will be I and compliant with HEP to self manage symptoms    Baseline  07/12/21: has program, will benefit from updating as progresses   Time 8    Period Weeks    Status Progressing,  continue goal   Target Date    PT LONG TERM GOAL #5 Pt will improve TUG to </=40 secs with RW in order to indicate dec fall risk.  Baseline:51.47 secs with RW Status: New            Plan     Clinical Impression Statement Focus of skilled session on reviewing and modifying current HEP to progress strengthening challenges.  Pt would benefit from progression of balance component of home program.  He remains ataxic primarily with fatigue today during gait demonstrating mild posterior sway and difficulty placing RLE.  Will continue to progress toward updated goals as able per POC.   Personal Factors and Comorbidities Age;Comorbidity 2    Comorbidities Hx of L BKA, CVA, HTN    Examination-Activity Limitations Caring for Others;Carry;Squat;Stairs;Stand;Transfers    Examination-Participation Restrictions Church;Cleaning;Community Activity;Meal Prep    Stability/Clinical Decision Making Stable/Uncomplicated    Rehab Potential Good    PT Frequency 2x / week    PT Duration 8 weeks   PT Treatment/Interventions ADLs/Self Care Home Management;Cryotherapy;Electrical  Stimulation;Moist Heat;Gait training;Stair training;Functional mobility training;Therapeutic activities;Therapeutic exercise;Balance training;Manual techniques;Wheelchair mobility training;Prosthetic Training;Orthotic Fit/Training;Patient/family education;Neuromuscular re-education;Passive range of motion;Energy conservation;Joint Manipulations    PT Next Visit Plan Update static balance component of HEP.  Continue to work on gait with walker, balance and coordination.    Consulted and Agree with Plan of Care Patient           Elease Etienne, PT, [facility] [address] [place], [place], [address] Phone: [phone]   Fax:  [phone] 09/13/21, 1:16 PM

## 2021-09-16 ENCOUNTER — Ambulatory Visit: Payer: Medicare Other | Admitting: Physical Therapy

## 2021-09-16 NOTE — Telephone Encounter (Signed)
Status update sent to [facility] again. This order was sent 10 days ago and wanting to check the status of it today. Will hold pending advisement.

## 2021-09-17 NOTE — Telephone Encounter (Signed)
Portal states that he request is in the final stages and processing for delivery. Will continue to monitor.

## 2021-09-18 NOTE — Telephone Encounter (Signed)
Equipment delivered to pt today.

## 2021-09-18 NOTE — Telephone Encounter (Signed)
Checked dashboard this morning still states pending final review and delivery. Will continue to hold until item released.

## 2021-09-20 ENCOUNTER — Encounter: Payer: Self-pay | Admitting: Physical Therapy

## 2021-09-20 ENCOUNTER — Ambulatory Visit: Payer: Medicare Other | Admitting: Physical Therapy

## 2021-09-20 DIAGNOSIS — R2689 Other abnormalities of gait and mobility: Secondary | ICD-10-CM

## 2021-09-20 DIAGNOSIS — M6281 Muscle weakness (generalized): Secondary | ICD-10-CM

## 2021-09-20 NOTE — Therapy (Signed)
OUTPATIENT PHYSICAL THERAPY TREATMENT NOTE and RE-CERT    Patient Name: Kristopher Ruiz. MRN: [id number] DOB:[birth year], 71 y.o., male 73 Date: 09/20/2021  PCP: Celedonio Savage, MD REFERRING PROVIDER: Dr. Lajoyce Corners     PT End of Session - 09/20/21 1235     Visit Number 21    Number of Visits 40   per updated POC   Date for PT Re-Evaluation 11/02/21   per updated POC   Authorization Type [employer]HC Medicare/medicaid    Progress Note Due on Visit 28    PT Start Time 1233    PT Stop Time 1314    PT Time Calculation (min) 41 min    Equipment Utilized During Treatment Gait belt    Activity Tolerance Patient tolerated treatment well    Behavior During Therapy WFL for tasks assessed/performed                  Past Medical History:  Diagnosis Date   Arthritis    Cataract    NS OU   Hypertension    Stroke [facility])    Past Surgical History:  Procedure Laterality Date   HIP FRACTURE SURGERY     LEG AMPUTATION BELOW KNEE     left leg   TOTAL HIP ARTHROPLASTY Left 10/31/2019   TOTAL HIP ARTHROPLASTY Left 10/31/2019   Procedure: LEFT TOTAL HIP ARTHROPLASTY, POSTERIOR;  Surgeon: Tarry Kos, MD;  Location: [facility];  Service: Orthopedics;  Laterality: Left;   Patient Active Problem List   Diagnosis Date Noted   Anemia 07/06/2020   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   Closed displaced fracture of left femoral neck with delayed healing 09/28/2019   Establishing care with new doctor, encounter for 06/01/2019   TIA (transient ischemic attack) 09/13/2018   Cerebellar stroke ([facility]) 09/06/2018   Hypoglycemia due to insulin 09/06/2018   Tobacco abuse 09/06/2018   Hyperlipidemia 09/06/2018   Essential hypertension 09/06/2018   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[facility]) 04/16/2017    REFERRING DIAG: R BKA   THERAPY DIAG:  Other abnormalities of gait and mobility  Muscle weakness (generalized)      PERTINENT HISTORY: Hx of L BKA, CVA, HTN    PRECAUTIONS: Fall   SUBJECTIVE: Got his wheelchair and is using it at home. Unable to use it with appts as it's too heavy [name] to lift into the car. No falls. Updated HEP is going well.   PAIN:  Are you having pain? No  TODAY'S TREATMENT:  PROSTHETICS Prosthetic care comments:  No issues.  Donning prosthesis: Modified independence Doffing prosthesis: Modified independence Prosthetic wear tolerance: all awake hours/day, 7 days/week; pt states he sometimes take the prosthetic off when he is just resting during the day Prosthetic weight bearing tolerance: no reported issues Residual limb condition: pt denies any issues with skin    GAIT: Gait pattern: step through pattern, decreased step length- Right, and ataxic Distance walked: 115 feet x 2 Assistive device utilized: Walker - 2 wheeled and prosthesis Level of assistance:  touch assist  Comments: continued with use of color bands at bottom of walker with pt to step right foot to red band and left foot to yellow band with decrease episodes of scissoring occurring as pt focused on bands.    STRENGTHENING: Scifit UE/LE's level 2.0  x 5 minutes with goal >/= 80 steps per minute for strengthening and activity tolerance   BALANCE/NMR: Alternating Step Taps: two foam bubbles on floor with HHA/other side  chair support for alternating forward foot taps, alternating cross foot taps, alternating forward double foot taps and alternating cross double foot taps for 10 reps each with cues for stance position and weight shifting.         PATIENT EDUCATION: Education details: continue with current HEP Person educated: Patient Education method: Medical illustrator Education comprehension: verbalized understanding  HOME EXERCISE PROGRAM: Access Code: [email or web address] URL: [email or web address] Date: 09/13/2021 Prepared by: Camille Bal  Exercises - [place] with Resistance  - 1 x daily - 5 x weekly -  1 sets - 10 reps - Supine March with Resistance Band  - 1 x daily - 5 x weekly - 2 sets - 10 reps - Wide Stance with Counter Support  - 1 x daily - 7 x weekly - 2 sets - 2 reps - Side Stepping with Counter Support  - 1 x daily - 7 x weekly - 1 sets - 4 reps - Single Leg Bridge  - 1 x daily - 5 x weekly - 2 sets - 8 reps - Sidelying Hip Abduction  - 1 x daily - 5 x weekly - 2 sets - 10 reps - Sit to Stand with Resistance Around Legs  - 1 x daily - 5 x weekly - 2 sets - 10 reps     GOALS      PT Short Term Goals All NEW STG to be met by 10/01/2021       PT SHORT TERM GOAL #1   Title Pt will be able to perform TUG <45 seconds with RW and CGA to improve functional mobility    Baseline 08/09/21: 51.47 seconds with RW, improved balance with only min guard assist needed, increased time with less assistance    Time    Period    Status Ongoing    Target Date      PT SHORT TERM GOAL #2   Title Pt will ambulate x 300' over indoor and outdoor unlevel paved surfaces at [facility] level in order to demo safety in home and community.    Baseline 08/09/21: continues to need min assist at times, mostly min guard assist   Time    Period    Status Revised    Target Date       PT SHORT TERM GOAL #3   Title Pt will be able to go up and down 4 steps with 1 rail for UE and SBA to improve safety    Baseline 08/09/21: continues to need bil rails with min guard assist for safety   Time    Period    Status Revised    Target Date    PT SHORT TERM GOAL #4 Pt will improve BERG balance goal to >/=28/56 in order to indicate dec fall risk.  Baseline: 24/56 Status: New            PT Long Term Goals - all NEW LTG to be met by 10/29/2021       PT LONG TERM GOAL #1   Title Pt will be able to perform 5x sit to stand with bil UE support and without UE support when standing and in <20 seconds to improve functional strength with transfers    Baseline 09/03/21 24.12 secs with single UE support to RW, improved  control, esp with repetition.    Time 8    Period Weeks    Status Ongoing          PT LONG TERM GOAL #2  Title Patient will demo >32/56 on BBS to improve functional standing balance and reduce fall risk    Baseline 09/03/21: 24/56   Time 8    Period Weeks    Status Progressing          PT LONG TERM GOAL #3   Title Pt will demo >500' with RW over unlevel outdoor surfaces, up/down inclines/ramp and up/down 4 steps with single UE support in alternating pattern in order to demo safety in community.    Baseline 09/03/21 met 230' with SBA and RW   Time 8    Period Weeks    Status Revised          PT LONG TERM GOAL #4   Title Pt will be I and compliant with HEP to self manage symptoms    Baseline  07/12/21: has program, will benefit from updating as progresses   Time 8    Period Weeks    Status Progressing,  continue goal   Target Date    PT LONG TERM GOAL #5 Pt will improve TUG to </=40 secs with RW in order to indicate dec fall risk.  Baseline:51.47 secs with RW Status: New            Plan     Clinical Impression Statement Today's skilled session continued to focus on strengthening, activity tolerance, gait with walker and balance training with no significant issues reported or noted in session. Rest breaks taken as needed. The pt is making steady progress and should benefit from continued PT to progress toward unmet goals.    Personal Factors and Comorbidities Age;Comorbidity 2    Comorbidities Hx of L BKA, CVA, HTN    Examination-Activity Limitations Caring for Others;Carry;Squat;Stairs;Stand;Transfers    Examination-Participation Restrictions Church;Cleaning;Community Activity;Meal Prep    Stability/Clinical Decision Making Stable/Uncomplicated    Rehab Potential Good    PT Frequency 2x / week    PT Duration 8 weeks   PT Treatment/Interventions ADLs/Self Care Home Management;Cryotherapy;Electrical Stimulation;Moist Heat;Gait training;Stair training;Functional mobility  training;Therapeutic activities;Therapeutic exercise;Balance training;Manual techniques;Wheelchair mobility training;Prosthetic Training;Orthotic Fit/Training;Patient/family education;Neuromuscular re-education;Passive range of motion;Energy conservation;Joint Manipulations    PT Next Visit Plan Continue to work on gait with walker, balance and coordination.    Consulted and Agree with Plan of Care Patient             Sallyanne Kuster, PTA, [facility] [address], [address] [place], [place] [address] [phone] 09/20/21, 4:50 PM

## 2021-09-23 ENCOUNTER — Ambulatory Visit: Payer: Medicare Other | Admitting: Physical Therapy

## 2021-09-23 ENCOUNTER — Other Ambulatory Visit: Payer: Self-pay

## 2021-09-23 MED ORDER — METOPROLOL SUCCINATE ER 25 MG PO TB24: 25.0000 mg | ORAL_TABLET | Freq: Every day | ORAL | 1 refills | Status: DC

## 2021-09-27 ENCOUNTER — Ambulatory Visit: Payer: Medicare Other | Admitting: Physical Therapy

## 2021-09-27 DIAGNOSIS — R2689 Other abnormalities of gait and mobility: Secondary | ICD-10-CM

## 2021-09-27 DIAGNOSIS — R278 Other lack of coordination: Secondary | ICD-10-CM

## 2021-09-27 DIAGNOSIS — M6281 Muscle weakness (generalized): Secondary | ICD-10-CM

## 2021-09-27 NOTE — Therapy (Signed)
OUTPATIENT PHYSICAL THERAPY TREATMENT NOTE    Patient Name: Kristopher Ruiz. MRN: [id number] DOB:November 21, 1950, 71 y.o., male 71 Date: 09/27/2021  PCP: Concepcion Living, MD REFERRING PROVIDER: Dr. Sharol Given     PT End of Session - 09/27/21 1020     Visit Number 22    Number of Visits 40   per updated POC   Date for PT Re-Evaluation 11/02/21   per updated POC   Authorization Type UHC Medicare/medicaid    Progress Note Due on Visit 28    PT Start Time 1018    PT Stop Time 1100    PT Time Calculation (min) 42 min    Equipment Utilized During Treatment Gait belt    Activity Tolerance Patient tolerated treatment well    Behavior During Therapy WFL for tasks assessed/performed                  Past Medical History:  Diagnosis Date   Arthritis    Cataract    NS OU   Hypertension    Stroke [facility])    Past Surgical History:  Procedure Laterality Date   HIP FRACTURE SURGERY     LEG AMPUTATION BELOW KNEE     left leg   TOTAL HIP ARTHROPLASTY Left 10/31/2019   TOTAL HIP ARTHROPLASTY Left 10/31/2019   Procedure: LEFT TOTAL HIP ARTHROPLASTY, POSTERIOR;  Surgeon: Leandrew Koyanagi, MD;  Location: [place];  Service: Orthopedics;  Laterality: Left;   Patient Active Problem List   Diagnosis Date Noted   Anemia 07/06/2020   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   Closed displaced fracture of left femoral neck with delayed healing 09/28/2019   Establishing care with new doctor, encounter for 06/01/2019   TIA (transient ischemic attack) 09/13/2018   Cerebellar stroke ([place]) 09/06/2018   Hypoglycemia due to insulin 09/06/2018   Tobacco abuse 09/06/2018   Hyperlipidemia 09/06/2018   Essential hypertension 09/06/2018   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[place]) 04/16/2017    REFERRING DIAG: R BKA   THERAPY DIAG:  Other abnormalities of gait and mobility  Muscle weakness (generalized)  Other lack of coordination      PERTINENT HISTORY: Hx of L BKA, CVA,  HTN   PRECAUTIONS: Fall   SUBJECTIVE: Nothing new since he was last here. Says 10am is "too early" for [place]. Will try to make appointments in afternoon.   PAIN:  Are you having pain? No  TODAY'S TREATMENT:  PROSTHETICS Prosthetic care comments:  No issues.  Donning prosthesis: Modified independence Doffing prosthesis: Modified independence Prosthetic wear tolerance: all awake hours/day, 7 days/week; pt states he sometimes take the prosthetic off when he is just resting during the day Prosthetic weight bearing tolerance: no reported issues Residual limb condition: pt denies any issues with skin    GAIT: Gait pattern: step through pattern, decreased step length- Right, and ataxic Distance walked: Various clinic distances  Assistive device utilized: Environmental consultant - 2 wheeled and prosthesis Level of assistance:  touch assist  Comments: Pt performed stand pivot from [facility] to SciFit and back w/min guard due to pt picking up RW to place on ground rather than turning w/walker    BALANCE/NMR: In // bars for single leg stability, BLE coordination and dynamic standing balance:  -alt toe taps to 6" step w/single UE support (RUE), 2x10 per side. Min A for steadying assist. Noted occasional posterolateral LOB to R side when tapping w/LLE. Min cues to "drive knee forward" w/LLE to reduce circumduction  -  Forward eccentric alt toe taps from 6" step w/BUE support for gentle lock/unlocking of L knee, x12 per side w/CGA for safety. Min cues for proper foot placement of LLE on step, as pt tends to bring L foot too far posteriorly.  -Lateral eccentric toe taps form 6" step w/BUE support, x15 per side. CGA when tapping w/LLE, min A when tapping w/RLE.  -Lateral monster walks w/BUE support and red theraband tied on distal quads, down and back in // bars x4. Min multimodal cues to maintain BLE abduction throughout for increased hip strengthening.   STRENGTHENING: Scifit level 4 for 6 minutes using BUE/BLE's level  for global strengthening, endurance and UE/LE coordination.     PATIENT EDUCATION: Education details: continue with current HEP Person educated: Patient Education method: Customer service manager Education comprehension: verbalized understanding  HOME EXERCISE PROGRAM: Access Code: [email or web address] URL: [email or web address] Date: 09/13/2021 Prepared by: [place] with Resistance  - 1 x daily - 5 x weekly - 1 sets - 10 reps - Supine March with Resistance Band  - 1 x daily - 5 x weekly - 2 sets - 10 reps - Wide Stance with Counter Support  - 1 x daily - 7 x weekly - 2 sets - 2 reps - Side Stepping with Counter Support  - 1 x daily - 7 x weekly - 1 sets - 4 reps - Single Leg Bridge  - 1 x daily - 5 x weekly - 2 sets - 8 reps - Sidelying Hip Abduction  - 1 x daily - 5 x weekly - 2 sets - 10 reps - Sit to Stand with Resistance Around Legs  - 1 x daily - 5 x weekly - 2 sets - 10 reps     GOALS     PT Short Term Goals All NEW STG to be met by 10/01/2021       PT SHORT TERM GOAL #1   Title Pt will be able to perform TUG <45 seconds with RW and CGA to improve functional mobility    Baseline 08/09/21: 51.47 seconds with RW, improved balance with only min guard assist needed, increased time with less assistance    Time    Period    Status Ongoing    Target Date      PT SHORT TERM GOAL #2   Title Pt will ambulate x 300' over indoor and outdoor unlevel paved surfaces at [facility] level in order to demo safety in home and community.    Baseline 08/09/21: continues to need min assist at times, mostly min guard assist   Time    Period    Status Revised    Target Date       PT SHORT TERM GOAL #3   Title Pt will be able to go up and down 4 steps with 1 rail for UE and SBA to improve safety    Baseline 08/09/21: continues to need bil rails with min guard assist for safety   Time    Period    Status Revised    Target Date    PT SHORT  TERM GOAL #4 Pt will improve BERG balance goal to >/=28/56 in order to indicate dec fall risk.  Baseline: 24/56 Status: New            PT Long Term Goals - all NEW LTG to be met by 10/29/2021       PT LONG TERM GOAL #1   Title Pt will  be able to perform 5x sit to stand with bil UE support and without UE support when standing and in <20 seconds to improve functional strength with transfers    Baseline 09/03/21 24.12 secs with single UE support to RW, improved control, esp with repetition.    Time 8    Period Weeks    Status Ongoing          PT LONG TERM GOAL #2   Title Patient will demo >32/56 on BBS to improve functional standing balance and reduce fall risk    Baseline 09/03/21: 24/56   Time 8    Period Weeks    Status Progressing          PT LONG TERM GOAL #3   Title Pt will demo >500' with RW over unlevel outdoor surfaces, up/down inclines/ramp and up/down 4 steps with single UE support in alternating pattern in order to demo safety in community.    Baseline 09/03/21 met 230' with SBA and RW   Time 8    Period Weeks    Status Revised          PT LONG TERM GOAL #4   Title Pt will be I and compliant with HEP to self manage symptoms    Baseline  07/12/21: has program, will benefit from updating as progresses   Time 8    Period Weeks    Status Progressing,  continue goal   Target Date    PT LONG TERM GOAL #5 Pt will improve TUG to </=40 secs with RW in order to indicate dec fall risk.  Baseline:51.47 secs with RW Status: New            Plan     Clinical Impression Statement Emphasis of skilled PT session on BLE strength, single leg stability and endurance. Pt requires min multimodal cues for proper placement of LLE w/activity, as he frequently placed foot too far posteriorly or narrow, needing min A to maintain balance. Continue POC.    Personal Factors and Comorbidities Age;Comorbidity 2    Comorbidities Hx of L BKA, CVA, HTN    Examination-Activity Limitations  Caring for Others;Carry;Squat;Stairs;Stand;Transfers    Examination-Participation Restrictions Church;Cleaning;Community Activity;Meal Prep    Stability/Clinical Decision Making Stable/Uncomplicated    Rehab Potential Good    PT Frequency 2x / week    PT Duration 8 weeks   PT Treatment/Interventions ADLs/Self Care Home Management;Cryotherapy;Electrical Stimulation;Moist Heat;Gait training;Stair training;Functional mobility training;Therapeutic activities;Therapeutic exercise;Balance training;Manual techniques;Wheelchair mobility training;Prosthetic Training;Orthotic Fit/Training;Patient/family education;Neuromuscular re-education;Passive range of motion;Energy conservation;Joint Manipulations    PT Next Visit Plan Goal assessment, gait training w/RW, dot drill,Continue to work on gait with walker, balance and coordination.    Consulted and Agree with Plan of Care Patient             Cruzita Lederer Donda Friedli, PT, DPT 09/27/21, 11:04 AM

## 2021-09-30 ENCOUNTER — Ambulatory Visit: Payer: Medicare Other | Attending: Orthopedic Surgery | Admitting: Physical Therapy

## 2021-09-30 DIAGNOSIS — R2689 Other abnormalities of gait and mobility: Secondary | ICD-10-CM | POA: Insufficient documentation

## 2021-09-30 DIAGNOSIS — R278 Other lack of coordination: Secondary | ICD-10-CM | POA: Diagnosis present

## 2021-09-30 DIAGNOSIS — M6281 Muscle weakness (generalized): Secondary | ICD-10-CM | POA: Diagnosis present

## 2021-09-30 NOTE — Therapy (Signed)
OUTPATIENT PHYSICAL THERAPY TREATMENT NOTE    Patient Name: Kristopher Ruiz. MRN: [id number] DOB:[birth year], 71 y.o., male 66 Date: 09/30/2021  PCP: Alen Bleacher, MD REFERRING PROVIDER: Dr. Sharol Given     PT End of Session - 09/30/21 1538     Visit Number 23    Number of Visits 40   per updated POC   Date for PT Re-Evaluation 11/02/21   per updated POC   Authorization Type UHC Medicare/medicaid    Progress Note Due on Visit 28    PT Start Time 1537   Pt arrived late   PT Stop Time 1615    PT Time Calculation (min) 38 min    Equipment Utilized During Treatment Gait belt    Activity Tolerance Patient tolerated treatment well    Behavior During Therapy WFL for tasks assessed/performed                   Past Medical History:  Diagnosis Date   Arthritis    Cataract    NS OU   Hypertension    Stroke [facility])    Past Surgical History:  Procedure Laterality Date   HIP FRACTURE SURGERY     LEG AMPUTATION BELOW KNEE     left leg   TOTAL HIP ARTHROPLASTY Left 10/31/2019   TOTAL HIP ARTHROPLASTY Left 10/31/2019   Procedure: LEFT TOTAL HIP ARTHROPLASTY, POSTERIOR;  Surgeon: Leandrew Koyanagi, MD;  Location: [place];  Service: Orthopedics;  Laterality: Left;   Patient Active Problem List   Diagnosis Date Noted   Anemia 07/06/2020   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   Closed displaced fracture of left femoral neck with delayed healing 09/28/2019   Establishing care with new doctor, encounter for 06/01/2019   TIA (transient ischemic attack) 09/13/2018   Cerebellar stroke ([place]) 09/06/2018   Hypoglycemia due to insulin 09/06/2018   Tobacco abuse 09/06/2018   Hyperlipidemia 09/06/2018   Essential hypertension 09/06/2018   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[place]) 04/16/2017    REFERRING DIAG: R BKA   THERAPY DIAG:  Other abnormalities of gait and mobility  Muscle weakness (generalized)  Other lack of coordination      PERTINENT HISTORY: Hx  of L BKA, CVA, HTN   PRECAUTIONS: Fall   SUBJECTIVE: "I am trying to get myself together". No new changes, reports he did a lot of walking this weekend   PAIN:  Are you having pain? No  TODAY'S TREATMENT:  PROSTHETICS Prosthetic care comments:  No issues.  Donning prosthesis: Modified independence Doffing prosthesis: Modified independence Prosthetic wear tolerance: all awake hours/day, 7 days/week; pt states he sometimes take the prosthetic off when he is just resting during the day Prosthetic weight bearing tolerance: no reported issues Residual limb condition: pt denies any issues with skin   Ther Act STG assessment     [facility] PT Assessment - 09/30/21 1546       Timed Up and Go Test   TUG Normal TUG    Normal TUG (seconds) 51.34   AVG of 47.28, 50.56 and 56.19s. Min guard throughout due to RLE ataxia and retropulsion            STAIRS:  Level of Assistance: Min A Stair Negotiation Technique: Step to Pattern with Single Rail on Right  Number of Stairs: 4   Height of Stairs: 6"  Comments: Pt required min A for retropulsion correction and proper placement of RLE while descending steps.   Gait Training  Gait pattern: step through pattern, decreased step length- Right, decreased stride length, decreased hip/knee flexion- Right, decreased ankle dorsiflexion- Right, scissoring, ataxic, trunk flexed, and narrow BOS Distance walked: 230' and 115' Assistive device utilized: Environmental consultant - 2 wheeled Level of assistance: CGA Comments: Min cues to focus on step placement rather than speed. Noted increased scissoring of RLE w/turns. Pt able to self-correct improper R foot placement 80% of the time. Pt very fatigued following initial gait trial, requiring seated rest break.      PATIENT EDUCATION: Education details: STG assessment results  Person educated: Patient Education method: Customer service manager Education comprehension: verbalized understanding  HOME EXERCISE  PROGRAM: Access Code: [email or web address] URL: [email or web address] Date: 09/13/2021 Prepared by: [place] with Resistance  - 1 x daily - 5 x weekly - 1 sets - 10 reps - Supine March with Resistance Band  - 1 x daily - 5 x weekly - 2 sets - 10 reps - Wide Stance with Counter Support  - 1 x daily - 7 x weekly - 2 sets - 2 reps - Side Stepping with Counter Support  - 1 x daily - 7 x weekly - 1 sets - 4 reps - Single Leg Bridge  - 1 x daily - 5 x weekly - 2 sets - 8 reps - Sidelying Hip Abduction  - 1 x daily - 5 x weekly - 2 sets - 10 reps - Sit to Stand with Resistance Around Legs  - 1 x daily - 5 x weekly - 2 sets - 10 reps     GOALS     PT Short Term Goals All NEW STG to be met by 10/01/2021       PT SHORT TERM GOAL #1   Title Pt will be able to perform TUG <45 seconds with RW and CGA to improve functional mobility    Baseline 08/09/21: 51.47 seconds with RW, improved balance with only min guard assist needed, increased time with less assistance; 51.34s (AVG of 3) on 7/3   Time    Period    Status PROGRESSING     Target Date      PT SHORT TERM GOAL #2   Title Pt will ambulate x 300' over indoor and outdoor unlevel paved surfaces at [facility] level in order to demo safety in home and community.    Baseline 08/09/21: continues to need min assist at times, mostly min guard assist; 09/30/21: pt ambulated 230' w/RW and CGA prior to needing to sit and rest    Time    Period    Status PROGRESSING     Target Date       PT SHORT TERM GOAL #3   Title Pt will be able to go up and down 4 steps with 1 rail for UE and SBA to improve safety    Baseline 08/09/21: continues to need bil rails with min guard assist for safety; 09/30/21: Pt able to ascend/descend w/single rail on R side, required min A for safety    Time    Period    Status PROGRESSING     Target Date    PT SHORT TERM GOAL #4 Pt will improve BERG balance goal to >/=28/56 in order to  indicate dec fall risk.  Baseline: 24/56 Status: New            PT Long Term Goals - all NEW LTG to be met by 10/29/2021       PT LONG  TERM GOAL #1   Title Pt will be able to perform 5x sit to stand with bil UE support and without UE support when standing and in <20 seconds to improve functional strength with transfers    Baseline 09/03/21 24.12 secs with single UE support to RW, improved control, esp with repetition.    Time 8    Period Weeks    Status Ongoing          PT LONG TERM GOAL #2   Title Patient will demo >32/56 on BBS to improve functional standing balance and reduce fall risk    Baseline 09/03/21: 24/56   Time 8    Period Weeks    Status Progressing          PT LONG TERM GOAL #3   Title Pt will demo >500' with RW over unlevel outdoor surfaces, up/down inclines/ramp and up/down 4 steps with single UE support in alternating pattern in order to demo safety in community.    Baseline 09/03/21 met 230' with SBA and RW   Time 8    Period Weeks    Status Revised          PT LONG TERM GOAL #4   Title Pt will be I and compliant with HEP to self manage symptoms    Baseline  07/12/21: has program, will benefit from updating as progresses   Time 8    Period Weeks    Status Progressing,  continue goal   Target Date    PT LONG TERM GOAL #5 Pt will improve TUG to </=40 secs with RW in order to indicate dec fall risk.  Baseline:51.47 secs with RW Status: New            Plan     Clinical Impression Statement Emphasis of skilled PT session on STG assessment. Pt is progressing 3/4 STGs, improving his TUG score and walking distance but still requiring min A due to ataxia of RLE, scissoring of BLEs w/turns, poor endurance and retropulsion during weightbearing activities. Pt ascended/descended 4 steps w/use of single rail on R but was unable to place RLE properly on step w/descent, resulting in posterior LOB. Pt is continuing to progress towards LTGs, continue POC.    Personal  Factors and Comorbidities Age;Comorbidity 2    Comorbidities Hx of L BKA, CVA, HTN    Examination-Activity Limitations Caring for Others;Carry;Squat;Stairs;Stand;Transfers    Examination-Participation Restrictions Church;Cleaning;Community Activity;Meal Prep    Stability/Clinical Decision Making Stable/Uncomplicated    Rehab Potential Good    PT Frequency 2x / week    PT Duration 8 weeks   PT Treatment/Interventions ADLs/Self Care Home Management;Cryotherapy;Electrical Stimulation;Moist Heat;Gait training;Stair training;Functional mobility training;Therapeutic activities;Therapeutic exercise;Balance training;Manual techniques;Wheelchair mobility training;Prosthetic Training;Orthotic Fit/Training;Patient/family education;Neuromuscular re-education;Passive range of motion;Energy conservation;Joint Manipulations    PT Next Visit Plan Finish goal assessment Merrilee Jansky), gait training w/RW, dot drill,Continue to work on gait with walker, balance and coordination.    Consulted and Agree with Plan of Care Patient             Cruzita Lederer Savon Bordonaro, PT, DPT 09/30/21, 4:19 PM

## 2021-10-04 ENCOUNTER — Ambulatory Visit: Payer: Medicare Other | Admitting: Physical Therapy

## 2021-10-04 DIAGNOSIS — R278 Other lack of coordination: Secondary | ICD-10-CM

## 2021-10-04 DIAGNOSIS — R2689 Other abnormalities of gait and mobility: Secondary | ICD-10-CM

## 2021-10-04 DIAGNOSIS — M6281 Muscle weakness (generalized): Secondary | ICD-10-CM

## 2021-10-04 NOTE — Therapy (Signed)
OUTPATIENT PHYSICAL THERAPY TREATMENT NOTE    Patient Name: Kristopher Ruiz. MRN: [id number] DOB:[birth year], 71 y.o., male Today's Date: 10/04/2021  PCP: Alen Bleacher, MD REFERRING PROVIDER: Dr. Sharol Given     PT End of Session - 10/04/21 1454     Visit Number 24    Number of Visits 40   per updated POC   Date for PT Re-Evaluation 11/02/21   per updated POC   Authorization Type UHC Medicare/medicaid    Progress Note Due on Visit 28    PT Start Time 1453   Pt arrived late   PT Stop Time 1532    PT Time Calculation (min) 39 min    Equipment Utilized During Treatment Gait belt    Activity Tolerance Patient tolerated treatment well    Behavior During Therapy WFL for tasks assessed/performed                    Past Medical History:  Diagnosis Date   Arthritis    Cataract    NS OU   Hypertension    Stroke [facility])    Past Surgical History:  Procedure Laterality Date   HIP FRACTURE SURGERY     LEG AMPUTATION BELOW KNEE     left leg   TOTAL HIP ARTHROPLASTY Left 10/31/2019   TOTAL HIP ARTHROPLASTY Left 10/31/2019   Procedure: LEFT TOTAL HIP ARTHROPLASTY, POSTERIOR;  Surgeon: Leandrew Koyanagi, MD;  Location: Center;  Service: Orthopedics;  Laterality: Left;   Patient Active Problem List   Diagnosis Date Noted   Anemia 07/06/2020   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   Closed displaced fracture of left femoral neck with delayed healing 09/28/2019   Establishing care with new doctor, encounter for 06/01/2019   TIA (transient ischemic attack) 09/13/2018   Cerebellar stroke ([place]) 09/06/2018   Hypoglycemia due to insulin 09/06/2018   Tobacco abuse 09/06/2018   Hyperlipidemia 09/06/2018   Essential hypertension 09/06/2018   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 No new falls  PAIN:  Are you having pain? No  TODAY'S TREATMENT:  PROSTHETICS Prosthetic care comments:  No issues.  Donning prosthesis: Modified independence Doffing prosthesis: Modified independence Prosthetic wear tolerance: all awake hours/day, 7 days/week; pt states he sometimes take the prosthetic off when he is just resting during the day Prosthetic weight bearing tolerance: no reported issues Residual limb condition: pt denies any issues with skin   NMR  STG assessment   [facility] PT Assessment - 10/04/21 1500       Berg Balance Test   Sit to Stand Needs minimal aid to stand or to stabilize    Standing Unsupported Unable to stand 30 seconds unassisted    Sitting with Back Unsupported but Feet Supported on Floor or Stool Able to sit safely and securely 2 minutes    Stand to Sit Controls descent by using hands    Transfers Needs one person to assist    Standing Unsupported with Eyes Closed Needs help to keep from falling    Standing Unsupported with Feet Together Needs help to attain position but able to stand for 30 seconds with  feet together    From Standing, Reach Forward with Outstretched Arm Loses balance while trying/requires external support   Able to reach 3" w/min A for stabilization   From Standing Position, Pick up Object from Floor Unable to try/needs assist to keep balance   Pt bracing against chair to maintain balance   From Standing Position, Turn to Look Behind Over each Shoulder Turn sideways only but maintains balance    Turn 360 Degrees Needs assistance while turning   Max A to prevent fall   Standing Unsupported, Alternately Place Feet on Step/Stool Able to complete >2 steps/needs minimal assist   Therapist providing HHA on L side   Standing Unsupported, One Foot in [employer] balance while stepping or standing   Able to take small step w/LLE but needs min  A to stabilize   Standing on One Leg Unable to try or needs assist to prevent fall   Min A to pick up LLE, max A for RLE   Total Score 13    Berg comment: High fall risk            Pt required mod-max A for transfers throughout session due to poor immediate standing balance, ataxia and balance overcorrection   Ther Ex  SciFit multi-peaks level 4 for 7 minutes using BUE/BLEs for dynamic cardiovascular conditioning, LE coordination and global strength.      PATIENT EDUCATION: Education details: STG assessment results  Person educated: Patient Education method: Customer service manager Education comprehension: verbalized understanding  HOME EXERCISE PROGRAM: Access Code: [email or web address] URL: [email or web address] Date: 09/13/2021 Prepared by: [place] with Resistance  - 1 x daily - 5 x weekly - 1 sets - 10 reps - Supine March with Resistance Band  - 1 x daily - 5 x weekly - 2 sets - 10 reps - Wide Stance with Counter Support  - 1 x daily - 7 x weekly - 2 sets - 2 reps - Side Stepping with Counter Support  - 1 x daily - 7 x weekly - 1 sets - 4 reps - Single Leg Bridge  - 1 x daily - 5 x weekly - 2 sets - 8 reps - Sidelying Hip Abduction  - 1 x daily - 5 x weekly - 2 sets - 10 reps - Sit to Stand with Resistance Around Legs  - 1 x daily - 5 x weekly - 2 sets - 10 reps     GOALS     PT Short Term Goals All NEW STG to be met by 10/01/2021       PT SHORT TERM GOAL #1   Title Pt will be able to perform TUG <45 seconds with RW and CGA to improve functional mobility    Baseline 08/09/21: 51.47 seconds with RW, improved balance with only min guard assist needed, increased time with less assistance; 51.34s (AVG of 3) on 7/3   Time    Period    Status PROGRESSING     Target Date      PT SHORT TERM GOAL #2   Title Pt will ambulate x 300' over indoor and outdoor unlevel paved surfaces at [facility] level in order to demo safety in  home and community.    Baseline 08/09/21: continues to need min assist at times, mostly min guard assist; 09/30/21: pt ambulated 230' w/RW and CGA prior to needing to sit and rest    Time    Period    Status PROGRESSING  Target Date       PT SHORT TERM GOAL #3   Title Pt will be able to go up and down 4 steps with 1 rail for UE and SBA to improve safety    Baseline 08/09/21: continues to need bil rails with min guard assist for safety; 09/30/21: Pt able to ascend/descend w/single rail on R side, required min A for safety    Time    Period    Status PROGRESSING     Target Date    PT SHORT TERM GOAL #4 Pt will improve BERG balance goal to >/=28/56 in order to indicate dec fall risk.  Baseline: 24/56; 13/56 on 7/7  Status: NOT MET             PT Long Term Goals - all NEW LTG to be met by 10/29/2021       PT LONG TERM GOAL #1   Title Pt will be able to perform 5x sit to stand with bil UE support and without UE support when standing and in <20 seconds to improve functional strength with transfers    Baseline 09/03/21 24.12 secs with single UE support to RW, improved control, esp with repetition.    Time 8    Period Weeks    Status Ongoing          PT LONG TERM GOAL #2   Title Patient will demo >16/56 on BBS to improve functional standing balance and reduce fall risk    Baseline 09/03/21: 24/56; 13/56 on 7/7   Time 8    Period Weeks    Status Revised           PT LONG TERM GOAL #3   Title Pt will demo >500' with RW over unlevel outdoor surfaces, up/down inclines/ramp and up/down 4 steps with single UE support in alternating pattern in order to demo safety in community.    Baseline 09/03/21 met 230' with SBA and RW   Time 8    Period Weeks    Status Revised          PT LONG TERM GOAL #4   Title Pt will be I and compliant with HEP to self manage symptoms    Baseline  07/12/21: has program, will benefit from updating as progresses   Time 8    Period Weeks    Status Progressing,   continue goal   Target Date    PT LONG TERM GOAL #5 Pt will improve TUG to </=40 secs with RW in order to indicate dec fall risk.  Baseline:51.47 secs with RW Status: New            Plan     Clinical Impression Statement Emphasis of skilled PT session on finishing STG assessment and endurance. Pt scored a 13/56 on Berg, which is 11 points lower than baseline. However, pt was using walker on baseline assessment, so disregarding score at this time. Updated LTG to reflect new baseline score. Pt continues to be limited by ataxia, decreased activity tolerance and overcorrection of balance during standing. Continue POC.    Personal Factors and Comorbidities Age;Comorbidity 2    Comorbidities Hx of L BKA, CVA, HTN    Examination-Activity Limitations Caring for Others;Carry;Squat;Stairs;Stand;Transfers    Examination-Participation Restrictions Church;Cleaning;Community Activity;Meal Prep    Stability/Clinical Decision Making Stable/Uncomplicated    Rehab Potential Good    PT Frequency 2x / week    PT Duration 8 weeks   PT Treatment/Interventions ADLs/Self Care Home Management;Cryotherapy;Electrical Stimulation;Moist  Heat;Gait training;Stair training;Functional mobility training;Therapeutic activities;Therapeutic exercise;Balance training;Manual techniques;Wheelchair mobility training;Prosthetic Training;Orthotic Fit/Training;Patient/family education;Neuromuscular re-education;Passive range of motion;Energy conservation;Joint Manipulations    PT Next Visit Plan  gait training w/RW, dot drill,Continue to work on gait with walker, balance and coordination.    Consulted and Agree with Plan of Care Patient             Cruzita Lederer Shaquana Buel, PT, DPT 10/04/21, 3:33 PM

## 2021-10-07 ENCOUNTER — Ambulatory Visit: Payer: Medicare Other | Admitting: Physical Therapy

## 2021-10-11 ENCOUNTER — Ambulatory Visit: Payer: Medicare Other | Admitting: Physical Therapy

## 2021-10-14 ENCOUNTER — Ambulatory Visit: Payer: Medicare Other | Admitting: Physical Therapy

## 2021-10-18 ENCOUNTER — Encounter: Payer: Self-pay | Admitting: Physical Therapy

## 2021-10-18 ENCOUNTER — Ambulatory Visit: Payer: Medicare Other | Admitting: Physical Therapy

## 2021-10-18 DIAGNOSIS — R2689 Other abnormalities of gait and mobility: Secondary | ICD-10-CM | POA: Diagnosis not present

## 2021-10-18 DIAGNOSIS — M6281 Muscle weakness (generalized): Secondary | ICD-10-CM

## 2021-10-18 NOTE — Therapy (Signed)
OUTPATIENT PHYSICAL THERAPY TREATMENT NOTE    Patient Name: Kristopher Ruiz. MRN: [id number] DOB:07-Aug-1950, 71 y.o., male 20 Date: 10/18/2021  PCP: Alen Bleacher, MD REFERRING PROVIDER: Dr. Sharol Given     PT End of Session - 10/18/21 1021     Visit Number 25    Number of Visits 40   per updated POC   Date for PT Re-Evaluation 11/02/21   per updated POC   Authorization Type UHC Medicare/medicaid    Progress Note Due on Visit 28    PT Start Time 1018    PT Stop Time 1100    PT Time Calculation (min) 42 min    Equipment Utilized During Treatment Gait belt    Activity Tolerance Patient tolerated treatment well    Behavior During Therapy WFL for tasks assessed/performed                    Past Medical History:  Diagnosis Date   Arthritis    Cataract    NS OU   Hypertension    Stroke [facility])    Past Surgical History:  Procedure Laterality Date   HIP FRACTURE SURGERY     LEG AMPUTATION BELOW KNEE     left leg   TOTAL HIP ARTHROPLASTY Left 10/31/2019   TOTAL HIP ARTHROPLASTY Left 10/31/2019   Procedure: LEFT TOTAL HIP ARTHROPLASTY, POSTERIOR;  Surgeon: Leandrew Koyanagi, MD;  Location: [place];  Service: Orthopedics;  Laterality: Left;   Patient Active Problem List   Diagnosis Date Noted   Anemia 07/06/2020   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   Closed displaced fracture of left femoral neck with delayed healing 09/28/2019   Establishing care with new doctor, encounter for 06/01/2019   TIA (transient ischemic attack) 09/13/2018   Cerebellar stroke ([place]) 09/06/2018   Hypoglycemia due to insulin 09/06/2018   Tobacco abuse 09/06/2018   Hyperlipidemia 09/06/2018   Essential hypertension 09/06/2018   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[place]) 04/16/2017    REFERRING DIAG: R BKA   THERAPY DIAG:  Other abnormalities of gait and mobility  Muscle weakness (generalized)      PERTINENT HISTORY: Hx of L BKA, CVA, HTN   PRECAUTIONS: Fall    SUBJECTIVE: No new changes/complaints.   PAIN:  Are you having pain? No  TODAY'S TREATMENT:  PROSTHETICS Prosthetic care comments:  No issues.  Donning prosthesis: Modified independence Doffing prosthesis: Modified independence Prosthetic wear tolerance: all awake hours/day, 7 days/week; pt states he sometimes take the prosthetic off when he is just resting during the day Prosthetic weight bearing tolerance: no reported issues Residual limb condition: pt denies any issues with skin   STRENGTHENING  SciFit UE/LE's hills setting with multi-peaks course level 4 x 8 minutes using BUE/BLEs for dynamic cardiovascular conditioning, LE coordination and global strengthening.      GAIT: Gait pattern: step through pattern, decreased stride length, and ataxic Distance walked: 115 x 1, plus around clinic with activities.  Assistive device utilized: Environmental consultant - 2 wheeled and prosthesis Level of assistance: CGA and Min A Comments: continued with use of bands at bottom of walker to assist with step placement. noted to have a medial moment in stance at knee. Call placed to [place] [facility] with appt set for Tuesday 7/25 at 3 pm with [facility] for alignment check.  BALANCE/NMR: Obstacle Negotiation: 4 cones<>2 bolsters on floor: weaving around cones<>stepping over the bolsters with RW with cues to stay within walker, use of band with cues  for step placement and cues on correct sequencing with stepping over the bolsters for 4 laps with CGA to min assist.          PATIENT EDUCATION: Education details: continue with current HEP Person educated: Patient Education method: Customer service manager Education comprehension: verbalized understanding  HOME EXERCISE PROGRAM: Access Code: [email or web address] URL: [email or web address] Date: 09/13/2021 Prepared by: [place] with Resistance  - 1 x daily - 5 x weekly - 1 sets - 10 reps - Supine March with  Resistance Band  - 1 x daily - 5 x weekly - 2 sets - 10 reps - Wide Stance with Counter Support  - 1 x daily - 7 x weekly - 2 sets - 2 reps - Side Stepping with Counter Support  - 1 x daily - 7 x weekly - 1 sets - 4 reps - Single Leg Bridge  - 1 x daily - 5 x weekly - 2 sets - 8 reps - Sidelying Hip Abduction  - 1 x daily - 5 x weekly - 2 sets - 10 reps - Sit to Stand with Resistance Around Legs  - 1 x daily - 5 x weekly - 2 sets - 10 reps     GOALS     PT Short Term Goals All NEW STG to be met by 10/01/2021       PT SHORT TERM GOAL #1   Title Pt will be able to perform TUG <45 seconds with RW and CGA to improve functional mobility    Baseline 08/09/21: 51.47 seconds with RW, improved balance with only min guard assist needed, increased time with less assistance; 51.34s (AVG of 3) on 7/3   Time    Period    Status PROGRESSING     Target Date      PT SHORT TERM GOAL #2   Title Pt will ambulate x 300' over indoor and outdoor unlevel paved surfaces at [facility] level in order to demo safety in home and community.    Baseline 08/09/21: continues to need min assist at times, mostly min guard assist; 09/30/21: pt ambulated 230' w/RW and CGA prior to needing to sit and rest    Time    Period    Status PROGRESSING     Target Date       PT SHORT TERM GOAL #3   Title Pt will be able to go up and down 4 steps with 1 rail for UE and SBA to improve safety    Baseline 08/09/21: continues to need bil rails with min guard assist for safety; 09/30/21: Pt able to ascend/descend w/single rail on R side, required min A for safety    Time    Period    Status PROGRESSING     Target Date    PT SHORT TERM GOAL #4 Pt will improve BERG balance goal to >/=28/56 in order to indicate dec fall risk.  Baseline: 24/56; 13/56 on 7/7  Status: NOT MET             PT Long Term Goals - all NEW LTG to be met by 10/29/2021       PT LONG TERM GOAL #1   Title Pt will be able to perform 5x sit to stand with bil UE  support and without UE support when standing and in <20 seconds to improve functional strength with transfers    Baseline 09/03/21 24.12 secs with single UE support to RW, improved control, esp  with repetition.    Time 8    Period Weeks    Status Ongoing          PT LONG TERM GOAL #2   Title Patient will demo >16/56 on BBS to improve functional standing balance and reduce fall risk    Baseline 09/03/21: 24/56; 13/56 on 7/7   Time 8    Period Weeks    Status Revised           PT LONG TERM GOAL #3   Title Pt will demo >500' with RW over unlevel outdoor surfaces, up/down inclines/ramp and up/down 4 steps with single UE support in alternating pattern in order to demo safety in community.    Baseline 09/03/21 met 230' with SBA and RW   Time 8    Period Weeks    Status Revised          PT LONG TERM GOAL #4   Title Pt will be I and compliant with HEP to self manage symptoms    Baseline  07/12/21: has program, will benefit from updating as progresses   Time 8    Period Weeks    Status Progressing,  continue goal   Target Date    PT LONG TERM GOAL #5 Pt will improve TUG to </=40 secs with RW in order to indicate dec fall risk.  Baseline:51.47 secs with RW Status: New            Plan     Clinical Impression Statement Today's skilled session continued to focus on strengthening and gait/obstacles with RW/prosthesis with up to min assist. No issues noted or reported in session.    Personal Factors and Comorbidities Age;Comorbidity 2    Comorbidities Hx of L BKA, CVA, HTN    Examination-Activity Limitations Caring for Others;Carry;Squat;Stairs;Stand;Transfers    Examination-Participation Restrictions Church;Cleaning;Community Activity;Meal Prep    Stability/Clinical Decision Making Stable/Uncomplicated    Rehab Potential Good    PT Frequency 2x / week    PT Duration 8 weeks   PT Treatment/Interventions ADLs/Self Care Home Management;Cryotherapy;Electrical Stimulation;Moist Heat;Gait  training;Stair training;Functional mobility training;Therapeutic activities;Therapeutic exercise;Balance training;Manual techniques;Wheelchair mobility training;Prosthetic Training;Orthotic Fit/Training;Patient/family education;Neuromuscular re-education;Passive range of motion;Energy conservation;Joint Manipulations    PT Next Visit Plan  gait training w/RW, dot drill,Continue to work on gait with walker, balance and coordination.    Consulted and Agree with Plan of Care Patient             Willow Ora, PTA, [place] [address] [place], [place] [address] [phone] 10/18/21, 3:27 PM

## 2021-10-21 ENCOUNTER — Ambulatory Visit: Payer: Medicare Other

## 2021-10-25 ENCOUNTER — Encounter: Payer: Self-pay | Admitting: Physical Therapy

## 2021-10-25 ENCOUNTER — Ambulatory Visit: Payer: Medicare Other | Admitting: Physical Therapy

## 2021-10-25 DIAGNOSIS — R2689 Other abnormalities of gait and mobility: Secondary | ICD-10-CM | POA: Diagnosis not present

## 2021-10-25 DIAGNOSIS — M6281 Muscle weakness (generalized): Secondary | ICD-10-CM

## 2021-10-25 NOTE — Therapy (Signed)
OUTPATIENT PHYSICAL THERAPY TREATMENT NOTE    Patient Name: Kristopher Ruiz. MRN: [id number] DOB:1950/06/11, 71 y.o., 71 male 62 Date: 10/25/2021  PCP: Alen Bleacher, MD REFERRING PROVIDER: Dr. Sharol Given     PT End of Session - 10/25/21 1403     Visit Number 26    Number of Visits 40   per updated POC   Date for PT Re-Evaluation 11/02/21   per updated POC   Authorization Type UHC Medicare/medicaid    Progress Note Due on Visit 28    PT Start Time 1400    PT Stop Time 1442    PT Time Calculation (min) 42 min    Equipment Utilized During Treatment Gait belt    Activity Tolerance Patient tolerated treatment well    Behavior During Therapy WFL for tasks assessed/performed                    Past Medical History:  Diagnosis Date   Arthritis    Cataract    NS OU   Hypertension    Stroke [facility])    Past Surgical History:  Procedure Laterality Date   HIP FRACTURE SURGERY     LEG AMPUTATION BELOW KNEE     left leg   TOTAL HIP ARTHROPLASTY Left 10/31/2019   TOTAL HIP ARTHROPLASTY Left 10/31/2019   Procedure: LEFT TOTAL HIP ARTHROPLASTY, POSTERIOR;  Surgeon: Leandrew Koyanagi, MD;  Location: [place];  Service: Orthopedics;  Laterality: Left;   Patient Active Problem List   Diagnosis Date Noted   Anemia 07/06/2020   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   Closed displaced fracture of left femoral neck with delayed healing 09/28/2019   Establishing care with new doctor, encounter for 06/01/2019   TIA (transient ischemic attack) 09/13/2018   Cerebellar stroke ([place]) 09/06/2018   Hypoglycemia due to insulin 09/06/2018   Tobacco abuse 09/06/2018   Hyperlipidemia 09/06/2018   Essential hypertension 09/06/2018   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[place]) 04/16/2017    REFERRING DIAG: R BKA   THERAPY DIAG:  Other abnormalities of gait and mobility  Muscle weakness (generalized)      PERTINENT HISTORY: Hx of L BKA, CVA, HTN   PRECAUTIONS: Fall    SUBJECTIVE: No new changes/complaints.  Arnetha Courser on Tuesday. He made alignment adjustments and pt feel walking with prosthesis is better. No falls.   PAIN:  Are you having pain? No  TODAY'S TREATMENT:  PROSTHETICS Prosthetic care comments:  No issues.  Donning prosthesis: Modified independence Doffing prosthesis: Modified independence Prosthetic wear tolerance: all awake hours/day, 7 days/week; pt states he sometimes take the prosthetic off when he is just resting during the day Prosthetic weight bearing tolerance: no reported issues Residual limb condition: pt denies any issues with skin   STRENGTHENING  SciFit UE/LE's hills setting with multi-peaks course level 4 x 8 minutes using BUE/BLEs for dynamic cardiovascular conditioning, LE coordination and global strengthening.      GAIT: Gait pattern: step through pattern, decreased stride length, and ataxic Distance walked: 115 x 1,350 x 1, plus around clinic with activities.  Assistive device utilized: Environmental consultant - 2 wheeled and prosthesis Level of assistance: CGA and Min A Comments: continued with use of bands at bottom of walker to assist with step placement. noted to have a medial moment in stance at knee. Call placed to [place] clinic with appt set for Tuesday 7/25 at 3 pm with [facility] for alignment check.  RAMP:  Level of Assistance: CGA  and Min A Assistive device utilized: Environmental consultant - 2 wheeled and prosthesis Ramp Comments: min assist for ascending/min guard for descending with cues on sequencing and technique  CURB:  Level of Assistance: CGA and Min A Assistive device utilized: Environmental consultant - 2 wheeled and prosthesis Curb Comments: min assist with descending for walker control with movement, CGA with ascending with pt having improved control/balance advancing walker up.       PATIENT EDUCATION: Education details: continue with current HEP Person educated: Patient Education method: Customer service manager Education  comprehension: verbalized understanding  HOME EXERCISE PROGRAM: Access Code: [email or web address] URL: [email or web address] Date: 09/13/2021 Prepared by: [place] with Resistance  - 1 x daily - 5 x weekly - 1 sets - 10 reps - Supine March with Resistance Band  - 1 x daily - 5 x weekly - 2 sets - 10 reps - Wide Stance with Counter Support  - 1 x daily - 7 x weekly - 2 sets - 2 reps - Side Stepping with Counter Support  - 1 x daily - 7 x weekly - 1 sets - 4 reps - Single Leg Bridge  - 1 x daily - 5 x weekly - 2 sets - 8 reps - Sidelying Hip Abduction  - 1 x daily - 5 x weekly - 2 sets - 10 reps - Sit to Stand with Resistance Around Legs  - 1 x daily - 5 x weekly - 2 sets - 10 reps     GOALS     PT Short Term Goals All NEW STG to be met by 10/01/2021       PT SHORT TERM GOAL #1   Title Pt will be able to perform TUG <45 seconds with RW and CGA to improve functional mobility    Baseline 08/09/21: 51.47 seconds with RW, improved balance with only min guard assist needed, increased time with less assistance; 51.34s (AVG of 3) on 7/3   Time    Period    Status PROGRESSING     Target Date      PT SHORT TERM GOAL #2   Title Pt will ambulate x 300' over indoor and outdoor unlevel paved surfaces at [facility] level in order to demo safety in home and community.    Baseline 08/09/21: continues to need min assist at times, mostly min guard assist; 09/30/21: pt ambulated 230' w/RW and CGA prior to needing to sit and rest    Time    Period    Status PROGRESSING     Target Date       PT SHORT TERM GOAL #3   Title Pt will be able to go up and down 4 steps with 1 rail for UE and SBA to improve safety    Baseline 08/09/21: continues to need bil rails with min guard assist for safety; 09/30/21: Pt able to ascend/descend w/single rail on R side, required min A for safety    Time    Period    Status PROGRESSING     Target Date    PT SHORT TERM GOAL #4 Pt  will improve BERG balance goal to >/=28/56 in order to indicate dec fall risk.  Baseline: 24/56; 13/56 on 7/7  Status: NOT MET             PT Long Term Goals - all NEW LTG to be met by 10/29/2021       PT LONG TERM GOAL #1   Title Pt will  be able to perform 5x sit to stand with bil UE support and without UE support when standing and in <20 seconds to improve functional strength with transfers    Baseline 09/03/21 24.12 secs with single UE support to RW, improved control, esp with repetition.    Time 8    Period Weeks    Status Ongoing          PT LONG TERM GOAL #2   Title Patient will demo >16/56 on BBS to improve functional standing balance and reduce fall risk    Baseline 09/03/21: 24/56; 13/56 on 7/7   Time 8    Period Weeks    Status Revised           PT LONG TERM GOAL #3   Title Pt will demo >500' with RW over unlevel outdoor surfaces, up/down inclines/ramp and up/down 4 steps with single UE support in alternating pattern in order to demo safety in community.    Baseline 09/03/21 met 230' with SBA and RW   Time 8    Period Weeks    Status Revised          PT LONG TERM GOAL #4   Title Pt will be I and compliant with HEP to self manage symptoms    Baseline  07/12/21: has program, will benefit from updating as progresses   Time 8    Period Weeks    Status Progressing,  continue goal   Target Date    PT LONG TERM GOAL #5 Pt will improve TUG to </=40 secs with RW in order to indicate dec fall risk.  Baseline:51.47 secs with RW Status: New            Plan     Clinical Impression Statement Today's skilled session continued to focus on strengthening and gait/barriers with walker/prosthesis with up to min assist needed with ramp/curb. Pt was able to increase his consecutive gait distance this session before needing to sit down.    Personal Factors and Comorbidities Age;Comorbidity 2    Comorbidities Hx of L BKA, CVA, HTN    Examination-Activity Limitations Caring for  Others;Carry;Squat;Stairs;Stand;Transfers    Examination-Participation Restrictions Church;Cleaning;Community Activity;Meal Prep    Stability/Clinical Decision Making Stable/Uncomplicated    Rehab Potential Good    PT Frequency 2x / week    PT Duration 8 weeks   PT Treatment/Interventions ADLs/Self Care Home Management;Cryotherapy;Electrical Stimulation;Moist Heat;Gait training;Stair training;Functional mobility training;Therapeutic activities;Therapeutic exercise;Balance training;Manual techniques;Wheelchair mobility training;Prosthetic Training;Orthotic Fit/Training;Patient/family education;Neuromuscular re-education;Passive range of motion;Energy conservation;Joint Manipulations    PT Next Visit Plan  LTGs due 10/29/21   Consulted and Agree with Plan of Care Patient             Willow Ora, PTA, [place] [address], [place], [place] [address] [phone] 10/25/21, 2:55 PM

## 2021-10-30 ENCOUNTER — Ambulatory Visit: Payer: Medicare Other | Attending: Orthopedic Surgery | Admitting: Physical Therapy

## 2021-10-30 DIAGNOSIS — S88112A Complete traumatic amputation at level between knee and ankle, left lower leg, initial encounter: Secondary | ICD-10-CM | POA: Diagnosis present

## 2021-10-30 DIAGNOSIS — R2689 Other abnormalities of gait and mobility: Secondary | ICD-10-CM | POA: Diagnosis present

## 2021-10-30 DIAGNOSIS — R278 Other lack of coordination: Secondary | ICD-10-CM | POA: Diagnosis present

## 2021-10-30 DIAGNOSIS — M6281 Muscle weakness (generalized): Secondary | ICD-10-CM | POA: Diagnosis present

## 2021-10-30 NOTE — Therapy (Signed)
OUTPATIENT PHYSICAL THERAPY TREATMENT NOTE    Patient Name: Kristopher Ruiz. MRN: [id number] DOB:[birth year], 71 y.o., male 44 Date: 10/30/2021  PCP: Alen Bleacher, MD REFERRING PROVIDER: Dr. Sharol Given     PT End of Session - 10/30/21 1449     Visit Number 27    Number of Visits 40   per updated POC   Date for PT Re-Evaluation 11/02/21   per updated POC   Authorization Type UHC Medicare/medicaid    Progress Note Due on Visit 28    PT Start Time 2800    PT Stop Time 1428    PT Time Calculation (min) 40 min    Equipment Utilized During Treatment Gait belt    Activity Tolerance Patient tolerated treatment well    Behavior During Therapy WFL for tasks assessed/performed                    Past Medical History:  Diagnosis Date   Arthritis    Cataract    NS OU   Hypertension    Stroke [facility])    Past Surgical History:  Procedure Laterality Date   HIP FRACTURE SURGERY     LEG AMPUTATION BELOW KNEE     left leg   TOTAL HIP ARTHROPLASTY Left 10/31/2019   TOTAL HIP ARTHROPLASTY Left 10/31/2019   Procedure: LEFT TOTAL HIP ARTHROPLASTY, POSTERIOR;  Surgeon: Leandrew Koyanagi, MD;  Location: [place];  Service: Orthopedics;  Laterality: Left;   Patient Active Problem List   Diagnosis Date Noted   Anemia 07/06/2020   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   Closed displaced fracture of left femoral neck with delayed healing 09/28/2019   Establishing care with new doctor, encounter for 06/01/2019   TIA (transient ischemic attack) 09/13/2018   Cerebellar stroke ([place]) 09/06/2018   Hypoglycemia due to insulin 09/06/2018   Tobacco abuse 09/06/2018   Hyperlipidemia 09/06/2018   Essential hypertension 09/06/2018   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[place]) 04/16/2017    REFERRING DIAG: R BKA   THERAPY DIAG:  Other abnormalities of gait and mobility  Muscle weakness (generalized)  Other lack of coordination     PERTINENT HISTORY: Hx of L BKA, CVA, HTN    PRECAUTIONS: Fall   SUBJECTIVE: No new changes to report. Pt unaware that he was going to DC this week.   PAIN:  Are you having pain? No  TODAY'S TREATMENT:  PROSTHETICS Prosthetic care comments:  No issues.  Donning prosthesis: Modified independence Doffing prosthesis: Modified independence Prosthetic wear tolerance: all awake hours/day, 7 days/week; pt states he sometimes take the prosthetic off when he is just resting during the day Prosthetic weight bearing tolerance: no reported issues Residual limb condition: pt denies any issues with skin   Ther Act   LTG assessment   [facility] PT Assessment - 10/30/21 1503       Transfers   Five time sit to stand comments  27.84s w/BUE support but no UE in standing      Timed Up and Go Test   TUG Normal TUG    Normal TUG (seconds) 61.73   AVG of 3 trials w/RW: 60.42s, 57.52s, 67.25s              GAIT: Gait pattern: step through pattern, decreased stride length, scissoring, ataxic, and narrow BOS Distance walked: 230'  Assistive device utilized: Environmental consultant - 2 wheeled and prosthesis Level of assistance: Min A Comments: continued with use of bands at bottom of  walker to assist with step placement. Pt continues to demonstrate scissoring of RLE and poor foot placement and AD management with turns.   Self-care/home management  Discussed POC moving forward, as originally planned for pt to DC at end of week due to frequent no-shows and lack of progress in therapy. Pt unaware of DC plan and verbalized that he would like to continue PT to improve his endurance, balance and overall gait kinematics. Informed pt that his current referral is for prosthetic training, not related to his CVA. Pt states he does not have a neurologist to provide referral for CVA. Therapist to contact PCP to inquire about obtaining new referral to address gait and balance impairments.   Discussed goal outcome and informed pt of decline in speed w/TUG and and 5x STS. Pt  unable to ambulate >230' today and demonstrated poor foot placement w/turns around track.     PATIENT EDUCATION: Education details: See self-care/home management  Person educated: Patient Education method: Customer service manager Education comprehension: verbalized understanding  HOME EXERCISE PROGRAM: Access Code: [email or web address] URL: [email or web address] Date: 09/13/2021 Prepared by: [place] with Resistance  - 1 x daily - 5 x weekly - 1 sets - 10 reps - Supine March with Resistance Band  - 1 x daily - 5 x weekly - 2 sets - 10 reps - Wide Stance with Counter Support  - 1 x daily - 7 x weekly - 2 sets - 2 reps - Side Stepping with Counter Support  - 1 x daily - 7 x weekly - 1 sets - 4 reps - Single Leg Bridge  - 1 x daily - 5 x weekly - 2 sets - 8 reps - Sidelying Hip Abduction  - 1 x daily - 5 x weekly - 2 sets - 10 reps - Sit to Stand with Resistance Around Legs  - 1 x daily - 5 x weekly - 2 sets - 10 reps     GOALS     PT Short Term Goals All NEW STG to be met by 10/01/2021       PT SHORT TERM GOAL #1   Title Pt will be able to perform TUG <45 seconds with RW and CGA to improve functional mobility    Baseline 08/09/21: 51.47 seconds with RW, improved balance with only min guard assist needed, increased time with less assistance; 51.34s (AVG of 3) on 7/3   Time    Period    Status PROGRESSING     Target Date      PT SHORT TERM GOAL #2   Title Pt will ambulate x 300' over indoor and outdoor unlevel paved surfaces at [facility] level in order to demo safety in home and community.    Baseline 08/09/21: continues to need min assist at times, mostly min guard assist; 09/30/21: pt ambulated 230' w/RW and CGA prior to needing to sit and rest    Time    Period    Status PROGRESSING     Target Date       PT SHORT TERM GOAL #3   Title Pt will be able to go up and down 4 steps with 1 rail for UE and SBA to improve safety     Baseline 08/09/21: continues to need bil rails with min guard assist for safety; 09/30/21: Pt able to ascend/descend w/single rail on R side, required min A for safety    Time    Period    Status PROGRESSING  Target Date    PT SHORT TERM GOAL #4 Pt will improve BERG balance goal to >/=28/56 in order to indicate dec fall risk.  Baseline: 24/56; 13/56 on 7/7  Status: NOT MET             PT Long Term Goals - all NEW LTG to be met by 10/29/2021       PT LONG TERM GOAL #1   Title Pt will be able to perform 5x sit to stand with bil UE support and without UE support when standing and in <20 seconds to improve functional strength with transfers    Baseline 09/03/21 24.12 secs with single UE support to RW, improved control, esp with repetition; 27.84s w/BUE support on chair but no UE to RW on 8/2   Time 8    Period Weeks    Status PARTIALLY MET           PT LONG TERM GOAL #2   Title Patient will demo >16/56 on BBS to improve functional standing balance and reduce fall risk    Baseline 09/03/21: 24/56; 13/56 on 7/7   Time 8    Period Weeks    Status Revised           PT LONG TERM GOAL #3   Title Pt will demo >500' with RW over unlevel outdoor surfaces, up/down inclines/ramp and up/down 4 steps with single UE support in alternating pattern in order to demo safety in community.    Baseline 09/03/21 met 230' with SBA and RW; min A for ramp and curb on 7/28; 230' w/RW and min A on 8/2   Time 8    Period Weeks    Status NOT MET         PT LONG TERM GOAL #4   Title Pt will be I and compliant with HEP to self manage symptoms    Baseline  07/12/21: has program, will benefit from updating as progresses   Time 8    Period Weeks    Status Progressing,  continue goal   Target Date    PT LONG TERM GOAL #5 Pt will improve TUG to </=40 secs with RW in order to indicate dec fall risk.  Baseline:51.47 secs with RW; 61.73s on 8/2 Status: NOT MET            Plan     Clinical Impression  Statement Emphasis of skilled PT session on LTG assessment and POC discussion with pt. Pt did not meet 2/5 goals and has partially met 1/5 goals. The remaining goals to be assessed next session. Pt performed slower on TUG today compared to previous sessions and had significant difficulty performing turns. Pt more slow on 5x STS but was able to perform without UE support on RW for first time, which therapist considers to be partially meeting goal. Pt unable to ambulate >230' today without a break and continues to require min A for safety. PT will have discussion next session regarding DC vs recert. Continue POC.    Personal Factors and Comorbidities Age;Comorbidity 2    Comorbidities Hx of L BKA, CVA, HTN    Examination-Activity Limitations Caring for Others;Carry;Squat;Stairs;Stand;Transfers    Examination-Participation Restrictions Church;Cleaning;Community Activity;Meal Prep    Stability/Clinical Decision Making Stable/Uncomplicated    Rehab Potential Good    PT Frequency 2x / week    PT Duration 8 weeks   PT Treatment/Interventions ADLs/Self Care Home Management;Cryotherapy;Electrical Stimulation;Moist Heat;Gait training;Stair training;Functional mobility training;Therapeutic activities;Therapeutic exercise;Balance training;Manual techniques;Wheelchair mobility training;Prosthetic  Training;Orthotic Fit/Training;Patient/family education;Neuromuscular re-education;Passive range of motion;Energy conservation;Joint Manipulations    PT Next Visit Plan  LTGs due 10/29/21   Consulted and Agree with Plan of Care Patient             [place], PT, [place] [address] [place], [place]  [address] Phone:  [phone] Fax:  [phone] 10/30/21, 3:55 PM

## 2021-11-01 ENCOUNTER — Ambulatory Visit: Payer: Medicare Other

## 2021-11-01 ENCOUNTER — Telehealth: Payer: Self-pay

## 2021-11-01 DIAGNOSIS — M6281 Muscle weakness (generalized): Secondary | ICD-10-CM

## 2021-11-01 DIAGNOSIS — R2689 Other abnormalities of gait and mobility: Secondary | ICD-10-CM

## 2021-11-01 DIAGNOSIS — R278 Other lack of coordination: Secondary | ICD-10-CM

## 2021-11-01 NOTE — Therapy (Signed)
OUTPATIENT PHYSICAL THERAPY TREATMENT NOTE    Patient Name: Kristopher Ruiz. MRN: [id number] DOB:[birth year], 71 y.o., male Today's Date: 11/01/2021  PCP: Alen Bleacher, MD REFERRING PROVIDER: Dr. Sharol Given   PHYSICAL THERAPY DISCHARGE SUMMARY  Visits from Start of Care: 28  Current functional level related to goals / functional outcomes: Patient has improved minimally his standing balance and some ambulatory balance using a RW.    Remaining deficits: Impaired ambulatory balance    Education / Equipment: PT POC, HEP,  fal precautions  Patient agrees to discharge. Patient goals were partially met. Patient is being discharged due to  requiring new referral to meet remaining deficits .   PT End of Session - 11/01/21 1320     Visit Number 28    Number of Visits 40    Date for PT Re-Evaluation 11/02/21    Authorization Type UHC Medicare/medicaid    Progress Note Due on Visit 28    PT Start Time 1316    PT Stop Time 1345    PT Time Calculation (min) 29 min    Equipment Utilized During Treatment Gait belt    Activity Tolerance Patient tolerated treatment well    Behavior During Therapy WFL for tasks assessed/performed             Past Medical History:  Diagnosis Date   Arthritis    Cataract    NS OU   Hypertension    Stroke [facility])    Past Surgical History:  Procedure Laterality Date   HIP FRACTURE SURGERY     LEG AMPUTATION BELOW KNEE     left leg   TOTAL HIP ARTHROPLASTY Left 10/31/2019   TOTAL HIP ARTHROPLASTY Left 10/31/2019   Procedure: LEFT TOTAL HIP ARTHROPLASTY, POSTERIOR;  Surgeon: Leandrew Koyanagi, MD;  Location: [place];  Service: Orthopedics;  Laterality: Left;   Patient Active Problem List   Diagnosis Date Noted   Anemia 07/06/2020   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   Closed displaced fracture of left femoral neck with delayed healing 09/28/2019   Establishing care with new doctor, encounter for 06/01/2019   TIA (transient ischemic attack)  09/13/2018   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   PAIN:  Are you having pain? No  TODAY'S TREATMENT:  PROSTHETICS Prosthetic care comments:  No issues.  Donning prosthesis: Modified independence Doffing prosthesis: Modified independence Prosthetic wear tolerance: all awake hours/day, 7 days/week; pt states he sometimes take the prosthetic off when he is just resting during the day Prosthetic weight bearing tolerance: no reported issues Residual limb condition: pt denies any issues with skin   Ther Act   LTG assessment   [facility] PT Assessment - 11/01/21 0001       Berg Balance Test   Sit to Stand Able to stand  independently using hands    Standing Unsupported Able to stand 30 seconds unsupported    Sitting with Back Unsupported but Feet Supported on Floor or Stool Able to sit safely and securely  2 minutes    Stand to Sit Controls descent by using hands    Transfers Able to transfer with verbal cueing and /or supervision    Standing Unsupported with Eyes Closed Able to stand 10 seconds with supervision    Standing Unsupported with Feet Together Needs help to attain position but able to stand for 30 seconds with feet together    From Standing, Reach Forward with Outstretched Arm Can reach forward >5 cm safely (2")    From Standing Position, Pick up Object from Floor Unable to try/needs assist to keep balance    From Standing Position, Turn to Look Behind Over each Shoulder Turn sideways only but maintains  balance    Turn 360 Degrees Needs assistance while turning    Standing Unsupported, Alternately Place Feet on Step/Stool Needs assistance to keep from falling or unable to try    Standing Unsupported, One Foot in [employer] balance while stepping or standing    Standing on One Leg Unable to try or needs assist to prevent fall    Total Score 22      Timed Up and Go Test   Normal TUG (seconds) 48            PATIENT EDUCATION: Education details: PT POC, OM results, need for referral for balance Person educated: Patient Education method: Customer service manager Education comprehension: verbalized understanding  HOME EXERCISE PROGRAM: Access Code: [email or web address] URL: [email or web address] Date: 09/13/2021 Prepared by: [place] with Resistance  - 1 x daily - 5 x weekly - 1 sets - 10 reps - Supine March with Resistance Band  - 1 x daily - 5 x weekly - 2 sets - 10 reps - Wide Stance with Counter Support  - 1 x daily - 7 x weekly - 2 sets - 2 reps - Side Stepping with Counter Support  - 1 x daily - 7 x weekly - 1 sets - 4 reps - Single Leg Bridge  - 1 x daily - 5 x weekly - 2 sets - 8 reps - Sidelying Hip Abduction  - 1 x daily - 5 x weekly - 2 sets - 10 reps - Sit to Stand with Resistance Around Legs  - 1 x daily - 5 x weekly - 2 sets - 10 reps  GOALS   PT Short Term Goals All NEW STG to be met by 10/01/2021       PT SHORT TERM GOAL #1   Title Pt will be able to perform TUG <45 seconds with RW and CGA to improve functional mobility    Baseline 08/09/21: 51.47 seconds with RW, improved balance with only min guard assist needed, increased time with less assistance; 51.34s (AVG of 3) on 7/3   Time    Period    Status PROGRESSING     Target Date      PT SHORT TERM GOAL #2   Title Pt will ambulate x 300' over indoor and outdoor unlevel paved surfaces at [facility] level in order to demo safety in home and community.    Baseline  08/09/21: continues to need min assist at times, mostly min guard assist; 09/30/21: pt ambulated 230' w/RW and CGA prior to needing to sit and rest    Time    Period    Status PROGRESSING     Target Date       PT SHORT TERM GOAL #3   Title Pt will be able to go up  and down 4 steps with 1 rail for UE and SBA to improve safety    Baseline 08/09/21: continues to need bil rails with min guard assist for safety; 09/30/21: Pt able to ascend/descend w/single rail on R side, required min A for safety    Time    Period    Status PROGRESSING     Target Date    PT SHORT TERM GOAL #4 Pt will improve BERG balance goal to >/=28/56 in order to indicate dec fall risk.  Baseline: 24/56; 13/56 on 7/7  Status: NOT MET             PT Long Term Goals - all NEW LTG to be met by 10/29/2021       PT LONG TERM GOAL #1   Title Pt will be able to perform 5x sit to stand with bil UE support and without UE support when standing and in <20 seconds to improve functional strength with transfers    Baseline 09/03/21 24.12 secs with single UE support to RW, improved control, esp with repetition; 27.84s w/BUE support on chair but no UE to RW on 8/2; 28.19s on 8/4   Time 8    Period Weeks    Status PARTIALLY MET           PT LONG TERM GOAL #2   Title Patient will demo >16/56 on BBS to improve functional standing balance and reduce fall risk    Baseline 09/03/21: 24/56; 13/56 on 7/7; 22/56 on 8/4   Time 8    Period Weeks    Status MET           PT LONG TERM GOAL #3   Title Pt will demo >500' with RW over unlevel outdoor surfaces, up/down inclines/ramp and up/down 4 steps with single UE support in alternating pattern in order to demo safety in community.    Baseline 09/03/21 met 230' with SBA and RW; min A for ramp and curb on 7/28; 230' w/RW and min A on 8/2   Time 8    Period Weeks    Status NOT MET         PT LONG TERM GOAL #4   Title Pt will be I and compliant with HEP to self manage symptoms    Baseline   07/12/21: has program, will benefit from updating as progresses   Time 8    Period Weeks    Status MET   Target Date    PT LONG TERM GOAL #5 Pt will improve TUG to </=40 secs with RW in order to indicate dec fall risk.  Baseline:51.47 secs with RW; 61.73s on 8/2; 48s with RW on 8/4 Status: NOT MET        Plan     Clinical Impression Statement Patient seen for skilled PT session with emphasis on TG assessment and dc. He met 2/5 LTGs. Patient completed the Timed Up and Go test (TUG) in 48 seconds.  Geriatrics: need for further assessment of fall risk: ? 12 sec; Recurrent falls: > 15 sec; Vestibular Disorders fall risk: > 15 sec; Parkinson's Disease fall risk: > 16 sec ([email or web address], 2023). Patient demonstrated increased fall risk noted by score of 22/56 on the [facility] Scale.  <45/56 = fall risk, <42/56 = predictive of recurrent falls, <40/56 = 100% fall risk  >41 = independent, 21-40 = assistive device, 0-20 = wheelchair level  MDC 6.9 (4 pts 45-56, 5 pts 35-44, 7 pts 25-34) (ANPTA Core Set of Outcome  Measures for Adults with Neurologic Conditions, 2018). Five times Sit to Stand Test (FTSS) TIME: 28.19 sec  Cut off scores indicative of increased fall risk: >12 sec CVA, >16 sec PD, >13 sec vestibular (ANPTA Core Set of Outcome Measures for Adults with Neurologic Conditions, 2018). 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 He would benefit from PT services with new referral to address remaining deficits. Patient dc from PT at this time.       Personal Factors and Comorbidities Age;Comorbidity 2    Comorbidities Hx of L BKA, CVA, HTN    Examination-Activity Limitations Caring for Others;Carry;Squat;Stairs;Stand;Transfers    Examination-Participation Restrictions Church;Cleaning;Community Activity;Meal Prep    Stability/Clinical Decision Making Stable/Uncomplicated    Rehab Potential Good    PT Frequency 2x / week    PT Duration 8 weeks    PT Treatment/Interventions ADLs/Self Care Home Management;Cryotherapy;Electrical Stimulation;Moist Heat;Gait training;Stair training;Functional mobility training;Therapeutic activities;Therapeutic exercise;Balance training;Manual techniques;Wheelchair mobility training;Prosthetic Training;Orthotic Fit/Training;Patient/family education;Neuromuscular re-education;Passive range of motion;Energy conservation;Joint Manipulations    PT Next Visit Plan  Patient discharged   Consulted and Agree with Plan of Care Patient             Debbora Dus, PT, DPT, CBIS  11/01/21, 1:53 PM

## 2021-11-01 NOTE — Telephone Encounter (Signed)
Dr. Elliot Gurney, [name]. was evaluated and treated by PT sine 05/15/21 for prosthetic training.  The patient would benefit from additional PT with a referral for balance re-training specifically due to his ataxia resulting from his CVA.  If you agree, please place an order in [facility] workque in [facility] or fax the order to [phone].  Thank you, Westley Foots, PT, DPT, [facility] [address] [place], [place]  [address] Phone:  [phone] Fax:  [phone]

## 2021-11-05 ENCOUNTER — Other Ambulatory Visit: Payer: Self-pay | Admitting: Student

## 2021-11-05 DIAGNOSIS — I639 Cerebral infarction, unspecified: Secondary | ICD-10-CM

## 2021-11-05 NOTE — Progress Notes (Signed)
Order place for additional PT recommended by PT

## 2021-11-05 NOTE — Telephone Encounter (Signed)
Appt scheduled for 12-24-21.  Teiana Hajduk,CMA

## 2021-11-11 ENCOUNTER — Telehealth: Payer: Self-pay | Admitting: Family

## 2021-11-11 NOTE — Telephone Encounter (Signed)
Received vm from Lauren @ [employer], asking the 6/9 ov note to be refaxed.  Refaxed [phone]/TL [phone]

## 2021-11-12 ENCOUNTER — Ambulatory Visit: Payer: Medicare Other | Admitting: Physical Therapy

## 2021-11-12 VITALS — BP 129/77 | HR 111

## 2021-11-12 DIAGNOSIS — M6281 Muscle weakness (generalized): Secondary | ICD-10-CM

## 2021-11-12 DIAGNOSIS — R2689 Other abnormalities of gait and mobility: Secondary | ICD-10-CM | POA: Diagnosis not present

## 2021-11-12 DIAGNOSIS — R278 Other lack of coordination: Secondary | ICD-10-CM
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
## 2021-11-19 ENCOUNTER — Ambulatory Visit: Payer: Medicare Other | Admitting: Physical Therapy

## 2021-11-19 ENCOUNTER — Encounter: Payer: Self-pay | Admitting: Physical Therapy

## 2021-11-19 DIAGNOSIS — M6281 Muscle weakness (generalized): Secondary | ICD-10-CM

## 2021-11-19 DIAGNOSIS — R2689 Other abnormalities of gait and mobility: Secondary | ICD-10-CM | POA: Diagnosis not present

## 2021-11-19 DIAGNOSIS — S88112A Complete traumatic amputation at level between knee and ankle, left lower leg, initial encounter: Secondary | ICD-10-CM

## 2021-11-19 DIAGNOSIS — R278 Other lack of coordination: Secondary | ICD-10-CM

## 2021-11-19 NOTE — Therapy (Signed)
OUTPATIENT PHYSICAL THERAPY TREATMENT NOTE   Patient Name: Kristopher Ruiz. MRN: [id number] DOB:[birth year], 71 y.o., male 53 Date: 11/19/2021  PCP: Jerre Simon, MD REFERRING PROVIDER: Billey Co, MD   END OF SESSION:   PT End of Session - 11/19/21 1455     Visit Number 2    Number of Visits 17   Plus eval   Date for PT Re-Evaluation 01/14/22    Authorization Type UHC Medicare/Medicaid of [place]    Progress Note Due on Visit 10    PT Start Time 1450    PT Stop Time 1529    PT Time Calculation (min) 39 min    Equipment Utilized During Treatment Gait belt    Activity Tolerance Patient tolerated treatment well    Behavior During Therapy WFL for tasks assessed/performed             Past Medical History:  Diagnosis Date   Arthritis    Cataract    NS OU   Hypertension    Stroke [facility])    Past Surgical History:  Procedure Laterality Date   HIP FRACTURE SURGERY     LEG AMPUTATION BELOW KNEE     left leg   TOTAL HIP ARTHROPLASTY Left 10/31/2019   TOTAL HIP ARTHROPLASTY Left 10/31/2019   Procedure: LEFT TOTAL HIP ARTHROPLASTY, POSTERIOR;  Surgeon: Tarry Kos, MD;  Location: [facility] OR;  Service: Orthopedics;  Laterality: Left;   Patient Active Problem List   Diagnosis Date Noted   Anemia 07/06/2020   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   Closed displaced fracture of left femoral neck with delayed healing 09/28/2019   Establishing care with new doctor, encounter for 06/01/2019   TIA (transient ischemic attack) 09/13/2018   Cerebellar stroke (HCC) 09/06/2018   Hypoglycemia due to insulin 09/06/2018   Tobacco abuse 09/06/2018   Hyperlipidemia 09/06/2018   Essential hypertension 09/06/2018   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HCC) 04/16/2017    REFERRING DIAG: I63.9 (ICD-10-CM) - Cerebellar stroke (HCC)   THERAPY DIAG:  Other abnormalities of gait and mobility  Other lack of coordination  Muscle weakness  (generalized)  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HCC)  Rationale for Evaluation and Treatment Rehabilitation  PERTINENT HISTORY: Hx of L BKA, CVA, HTN, 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  PAIN:  Are you having pain? No   OBJECTIVE: (objective measures completed at initial evaluation unless otherwise dated)   TODAY'S TREATMENT:  Discussed information from [employer] in regards to obtaining new manual vs power w/c and current limitations and requirements.  Pt states he has received a new manual w/c, but uses his transport chair for PT because his friend is able to lift this into vehicle.  PT to follow-up about [employer] inquiry.  -Adv/retreat alt LE to midline dot target; pt has inc anterior lean w/ retro step of RLE d/t ataxia, severe requiring intermittent second person SBA; several standing rests w/ pt cued to hold static balance w/ feet apart and light CGA-truncal ataxia mildly subsides during standing rests  -adv/retreat over yardstick for low target challenge-used midline yardstick to provide step width target, intermittently requires second step to clear hurdle  -standing w/ BUE support progressed to fingertip support w/ inc truncal ataxia to perform progressively widened lateral step outs to floor target -countertop w/ RUE support fwd stepping to tile line target >  stride position w/ RUE on counter and LUE HHA assist fwd/retro w/ RLE only, progressively lengthened position until simulated lunge position obtained Pt requires prolonged seated rest following due to fatigue.  PATIENT EDUCATION: Education details:  Continue walking at home as able.  Continue HEP, to be reviewed at next session. Person educated: Patient Education method: Medical illustrator Education comprehension: verbalized understanding and needs further education   HOME EXERCISE PROGRAM: To be  reviewed from previous bout of therapy and updated prn    GOALS: Goals reviewed with patient? Yes  SHORT TERM GOALS: Target date: 12/10/2021  Pt will be independent with initial HEP for improved strength, balance, transfers and gait.  Baseline: Needs to be reviewed from previous bout of therapy  Goal status: INITIAL  2.  Pt will improve 5 x STS to less than or equal to 30s seconds w/BUE support and CGA to demonstrate improved functional strength and transfer efficiency.   Baseline: 35.63s w/BUE support and min A Goal status: INITIAL  3.  Pt will improve gait velocity to at least 0.9 ft/s w/LRAD for improved gait efficiency   Baseline: 0.62ft/s w/RW  Goal status: INITIAL  4.  Berg to be performed at 30 day STG assessment and LTG written  Baseline:  Goal status: INITIAL  5.  TUG to be performed at 30 day STG assessment and LTG written  Baseline:  Goal status: INITIAL  6.  Pt will ambulate 250' w/LRAD and CGA for improved functional mobility and endurance  Baseline:  Goal status: INITIAL  LONG TERM GOALS: Target date: 01/07/2022  Pt will improve 5 x STS to less than or equal to 25 seconds w/BUE support mod I to demonstrate improved functional strength and transfer efficiency.   Baseline: 35.63s w/BUE support and min A Goal status: INITIAL  2.  Pt will improve gait velocity to at least 1.4 ft/s w/LRAD for improved gait efficiency and performance at limited community ambulator level   Baseline: 0.76ft/s w/RW Goal status: INITIAL  3.  Berg goal Baseline:  Goal status: INITIAL  4.  TUG goal Baseline:  Goal status: INITIAL  5.  Pt will ambulate >400' w/RW and LRAD w/S* and without rest break for improved functional mobility and endurance  Baseline:  Goal status: INITIAL   ASSESSMENT:  CLINICAL IMPRESSION: Initiated session with discussion about manual wheelchair with therapist to reach out to [employer] about pt having obtained manual w/c already.  Practiced step  coordination activities this session to improve motor planning and stability with stepping.  Pt still requires BUE support for greatest safety during dynamic activity.  PT to continue to address coordination deficits and motor planning advancements as able in ongoing sessions.  OBJECTIVE IMPAIRMENTS Abnormal gait, decreased activity tolerance, decreased balance, decreased cognition, decreased coordination, decreased endurance, decreased mobility, difficulty walking, decreased safety awareness, and prosthetic dependency .   ACTIVITY LIMITATIONS carrying, lifting, bending, sitting, standing, squatting, stairs, transfers, bed mobility, reach over head, and locomotion level  PARTICIPATION LIMITATIONS: meal prep, cleaning, laundry, interpersonal relationship, driving, shopping, community activity, and yard work  PERSONAL FACTORS Age, Fitness, Past/current experiences, Time since onset of injury/illness/exacerbation, Transportation, and 1 comorbidity: L BKA are also affecting patient's functional outcome.   REHAB POTENTIAL: Fair due to history of noncompliance w/PT and decreased social support system   CLINICAL DECISION MAKING: Evolving/moderate complexity  EVALUATION COMPLEXITY: Moderate  PLAN: PT FREQUENCY: 2x/week  PT DURATION: 8 weeks  PLANNED INTERVENTIONS: Therapeutic exercises, Therapeutic activity, Neuromuscular re-education, Balance training, Gait training, Patient/Family education,  Self Care, Stair training, Visual/preceptual remediation/compensation, Prosthetic training, DME instructions, Manual therapy, and Re-evaluation  PLAN FOR NEXT SESSION: Review HEP, ladder drills, dot drills, SciFit intervals for endurance, SIT <>STANDS, eccentric control of quads    Sadie Haber, PT, DPT 11/19/2021, 4:45 PM

## 2021-11-22 ENCOUNTER — Ambulatory Visit: Payer: Medicare Other

## 2021-11-26 ENCOUNTER — Encounter: Payer: Self-pay | Admitting: Physical Therapy

## 2021-11-26 ENCOUNTER — Ambulatory Visit: Payer: Medicare Other | Admitting: Physical Therapy

## 2021-11-26 DIAGNOSIS — R278 Other lack of coordination: Secondary | ICD-10-CM

## 2021-11-26 DIAGNOSIS — M6281 Muscle weakness (generalized): Secondary | ICD-10-CM

## 2021-11-26 DIAGNOSIS — S88112A Complete traumatic amputation at level between knee and ankle, left lower leg, initial encounter: Secondary | ICD-10-CM

## 2021-11-26 DIAGNOSIS — R2689 Other abnormalities of gait and mobility: Secondary | ICD-10-CM

## 2021-11-26 NOTE — Patient Instructions (Signed)
Access Code: [email or web address] URL: [email or web address] Date: 11/26/2021 Prepared by: Camille Bal  Exercises - Side Stepping with Counter Support  - 1 x daily - 7 x weekly - 1 sets - 4 reps - Single Leg Bridge  - 1 x daily - 5 x weekly - 2 sets - 8 reps - Sit to Stand with Resistance Around Legs  - 1 x daily - 5 x weekly - 2 sets - 10 reps - Standing March with Counter Support  - 1 x daily - 7 x weekly - 3 sets - 10 reps - Standing Hip Extension with Counter Support  - 1 x daily - 7 x weekly - 3 sets - 10 reps - Feet Together Balance at [employer] Open  - 1 x daily - 7 x weekly - 3 sets - 10 reps

## 2021-11-26 NOTE — Therapy (Unsigned)
OUTPATIENT PHYSICAL THERAPY TREATMENT NOTE   Patient Name: Kristopher Ruiz. MRN: [id number] DOB:January 12, 1951, 71 y.o., male 17 Date: 11/27/2021  PCP: Jerre Simon, MD REFERRING PROVIDER: Billey Co, MD   END OF SESSION:   PT End of Session - 11/26/21 1457     Visit Number 3    Number of Visits 17   Plus eval   Date for PT Re-Evaluation 01/14/22    Authorization Type UHC Medicare/Medicaid of [place]    Progress Note Due on Visit 10    PT Start Time 1450    PT Stop Time 1534    PT Time Calculation (min) 44 min    Equipment Utilized During Treatment Gait belt    Activity Tolerance Patient tolerated treatment well    Behavior During Therapy WFL for tasks assessed/performed             Past Medical History:  Diagnosis Date   Arthritis    Cataract    NS OU   Hypertension    Stroke [facility])    Past Surgical History:  Procedure Laterality Date   HIP FRACTURE SURGERY     LEG AMPUTATION BELOW KNEE     left leg   TOTAL HIP ARTHROPLASTY Left 10/31/2019   TOTAL HIP ARTHROPLASTY Left 10/31/2019   Procedure: LEFT TOTAL HIP ARTHROPLASTY, POSTERIOR;  Surgeon: Tarry Kos, MD;  Location: [facility] OR;  Service: Orthopedics;  Laterality: Left;   Patient Active Problem List   Diagnosis Date Noted   Anemia 07/06/2020   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   Closed displaced fracture of left femoral neck with delayed healing 09/28/2019   Establishing care with new doctor, encounter for 06/01/2019   TIA (transient ischemic attack) 09/13/2018   Cerebellar stroke (HCC) 09/06/2018   Hypoglycemia due to insulin 09/06/2018   Tobacco abuse 09/06/2018   Hyperlipidemia 09/06/2018   Essential hypertension 09/06/2018   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HCC) 04/16/2017    REFERRING DIAG: I63.9 (ICD-10-CM) - Cerebellar stroke (HCC)   THERAPY DIAG:  Other abnormalities of gait and mobility  Other lack of coordination  Muscle weakness  (generalized)  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HCC)  Rationale for Evaluation and Treatment Rehabilitation  PERTINENT HISTORY: Hx of L BKA, CVA, HTN, 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  He had his RW and was trying to vacuum because he thought this would help him "get his walking".  He states he wants more exercises for his balance.  He has been walking at home with walker.  PAIN:  Are you having pain? No   OBJECTIVE: (objective measures completed at initial evaluation unless otherwise dated)   TODAY'S TREATMENT:  Emphasized safety using RW and need for BUE support to prevent excessive ataxia.  Discussed not attempting to vacuum again until he doesn't require UE support on an AD to prevent further falls.  Discussed possible injuries, pt states no lingering soreness or notable scraps or injuries.  Verbally reviewed HEP from prior episode of care.  Removed exercises that pt reported being easy, reviewed exercises maintained.  Made additions and physically reviewed all listed below:  Exercises - Side Stepping with Counter Support  - 1 x daily - 7 x weekly - 1 sets - 4 reps - Single Leg Bridge  x12 each LE; LLE demos  inc instability in pelvis and lower trunk region - Sit to Stand with Resistance Around Legs  - 1 x daily - 5 x weekly - 2 sets - 10 reps - GREEN T-BAND - Standing March with Counter Support  - 1 x daily - 7 x weekly - 3 sets - 10 reps - Standing Hip Extension with Counter Support  - 1 x daily - 7 x weekly - 3 sets - 10 reps - Feet Together Balance at [employer] Open  2x30sec w/ CGA  PATIENT EDUCATION: Education details:  Continue walking at home as able.  Continue HEP Person educated: Patient Education method: Medical illustrator Education comprehension: verbalized understanding and needs further  education   HOME EXERCISE PROGRAM: Access Code: [email or web address] URL: [email or web address] Date: 11/26/2021 Prepared by: Camille Bal  Exercises - Side Stepping with Counter Support  - 1 x daily - 7 x weekly - 1 sets - 4 reps - Single Leg Bridge  - 1 x daily - 5 x weekly - 2 sets - 8 reps - Sit to Stand with Resistance Around Legs  - 1 x daily - 5 x weekly - 2 sets - 10 reps - Standing March with Counter Support  - 1 x daily - 7 x weekly - 3 sets - 10 reps - Standing Hip Extension with Counter Support  - 1 x daily - 7 x weekly - 3 sets - 10 reps - Feet Together Balance at [employer] Open  - 1 x daily - 7 x weekly - 3 sets - 10 reps  GOALS: Goals reviewed with patient? Yes  SHORT TERM GOALS: Target date: 12/10/2021  Pt will be independent with initial HEP for improved strength, balance, transfers and gait.  Baseline: Needs to be reviewed from previous bout of therapy  Goal status: INITIAL  2.  Pt will improve 5 x STS to less than or equal to 30s seconds w/BUE support and CGA to demonstrate improved functional strength and transfer efficiency.   Baseline: 35.63s w/BUE support and min A Goal status: INITIAL  3.  Pt will improve gait velocity to at least 0.9 ft/s w/LRAD for improved gait efficiency   Baseline: 0.14ft/s w/RW  Goal status: INITIAL  4.  Berg to be performed at 30 day STG assessment and LTG written  Baseline:  Goal status: INITIAL  5.  TUG to be performed at 30 day STG assessment and LTG written  Baseline:  Goal status: INITIAL  6.  Pt will ambulate 250' w/LRAD and CGA for improved functional mobility and endurance  Baseline:  Goal status: INITIAL  LONG TERM GOALS: Target date: 01/07/2022  Pt will improve 5 x STS to less than or equal to 25 seconds w/BUE support mod I to demonstrate improved functional strength and transfer efficiency.   Baseline: 35.63s w/BUE support and min A Goal status: INITIAL  2.  Pt will improve gait velocity to  at least 1.4 ft/s w/LRAD for improved gait efficiency and performance at limited community ambulator level   Baseline: 0.59ft/s w/RW Goal status: INITIAL  3.  Berg goal Baseline:  Goal status: INITIAL  4.  TUG goal Baseline:  Goal status: INITIAL  5.  Pt will ambulate >400' w/RW and LRAD w/S* and without rest break for improved functional mobility and endurance  Baseline:  Goal status: INITIAL   ASSESSMENT:  CLINICAL IMPRESSION: Session spent reviewing and modifying HEP to be targeted towards developing stability in standing.  Pt continues to be limited by  trunk and limb ataxia when performing dynamic movement and some static holds without UE support.  Will attempt to challenge this in coming sessions to promote improved motor control as able.  OBJECTIVE IMPAIRMENTS Abnormal gait, decreased activity tolerance, decreased balance, decreased cognition, decreased coordination, decreased endurance, decreased mobility, difficulty walking, decreased safety awareness, and prosthetic dependency .   ACTIVITY LIMITATIONS carrying, lifting, bending, sitting, standing, squatting, stairs, transfers, bed mobility, reach over head, and locomotion level  PARTICIPATION LIMITATIONS: meal prep, cleaning, laundry, interpersonal relationship, driving, shopping, community activity, and yard work  PERSONAL FACTORS Age, Fitness, Past/current experiences, Time since onset of injury/illness/exacerbation, Transportation, and 1 comorbidity: L BKA are also affecting patient's functional outcome.   REHAB POTENTIAL: Fair due to history of noncompliance w/PT and decreased social support system   CLINICAL DECISION MAKING: Evolving/moderate complexity  EVALUATION COMPLEXITY: Moderate  PLAN: PT FREQUENCY: 2x/week  PT DURATION: 8 weeks  PLANNED INTERVENTIONS: Therapeutic exercises, Therapeutic activity, Neuromuscular re-education, Balance training, Gait training, Patient/Family education, Self Care, Stair  training, Visual/preceptual remediation/compensation, Prosthetic training, DME instructions, Manual therapy, and Re-evaluation  PLAN FOR NEXT SESSION: Modify/progress HEP prn, ladder drills, dot drills, SciFit intervals for endurance, SIT <>STANDS, eccentric control of quads, trial ankle wts w/ standing march vs steps in // bars, standing wide stance w/ band around thighs - banded STS seemed to provide helpful proprioceptive input    Sadie Haber, PT, DPT 11/27/2021, 6:34 PM

## 2021-11-29 ENCOUNTER — Ambulatory Visit: Payer: Medicare Other | Attending: Orthopedic Surgery | Admitting: Physical Therapy

## 2021-11-29 DIAGNOSIS — R2681 Unsteadiness on feet: Secondary | ICD-10-CM | POA: Insufficient documentation

## 2021-11-29 DIAGNOSIS — R278 Other lack of coordination: Secondary | ICD-10-CM

## 2021-11-29 DIAGNOSIS — R2689 Other abnormalities of gait and mobility: Secondary | ICD-10-CM | POA: Diagnosis present

## 2021-11-29 DIAGNOSIS — S88112A Complete traumatic amputation at level between knee and ankle, left lower leg, initial encounter: Secondary | ICD-10-CM | POA: Diagnosis present

## 2021-11-29 DIAGNOSIS — R260 Ataxic gait: Secondary | ICD-10-CM | POA: Diagnosis present

## 2021-11-29 DIAGNOSIS — M6281 Muscle weakness (generalized): Secondary | ICD-10-CM

## 2021-11-29 NOTE — Therapy (Signed)
OUTPATIENT PHYSICAL THERAPY TREATMENT NOTE   Patient Name: Kristopher Ruiz. MRN: [id number] DOB:11-11-1950, 71 y.o., male 20 Date: 11/29/2021  PCP: Jerre Simon, MD REFERRING PROVIDER: Billey Co, MD   END OF SESSION:   PT End of Session - 11/29/21 1408     Visit Number 4    Number of Visits 17   Plus eval   Date for PT Re-Evaluation 01/14/22    Authorization Type UHC Medicare/Medicaid of [place]    Progress Note Due on Visit 10    PT Start Time 1405    PT Stop Time 1445    PT Time Calculation (min) 40 min    Equipment Utilized During Treatment Gait belt    Activity Tolerance Patient tolerated treatment well    Behavior During Therapy WFL for tasks assessed/performed              Past Medical History:  Diagnosis Date   Arthritis    Cataract    NS OU   Hypertension    Stroke [facility])    Past Surgical History:  Procedure Laterality Date   HIP FRACTURE SURGERY     LEG AMPUTATION BELOW KNEE     left leg   TOTAL HIP ARTHROPLASTY Left 10/31/2019   TOTAL HIP ARTHROPLASTY Left 10/31/2019   Procedure: LEFT TOTAL HIP ARTHROPLASTY, POSTERIOR;  Surgeon: Tarry Kos, MD;  Location: [facility] OR;  Service: Orthopedics;  Laterality: Left;   Patient Active Problem List   Diagnosis Date Noted   Anemia 07/06/2020   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   Closed displaced fracture of left femoral neck with delayed healing 09/28/2019   Establishing care with new doctor, encounter for 06/01/2019   TIA (transient ischemic attack) 09/13/2018   Cerebellar stroke (HCC) 09/06/2018   Hypoglycemia due to insulin 09/06/2018   Tobacco abuse 09/06/2018   Hyperlipidemia 09/06/2018   Essential hypertension 09/06/2018   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HCC) 04/16/2017    REFERRING DIAG: I63.9 (ICD-10-CM) - Cerebellar stroke (HCC)   THERAPY DIAG:  Other abnormalities of gait and mobility  Other lack of coordination  Muscle weakness  (generalized)  Rationale for Evaluation and Treatment Rehabilitation  PERTINENT HISTORY: Hx of L BKA, CVA, HTN, 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 Informed pt that this therapist has not seen him for over two weeks and did not inform him of a time. Pt reports his HEP is going well, no new falls.    PAIN:  Are you having pain? No   OBJECTIVE: (objective measures completed at initial evaluation unless otherwise dated)   TODAY'S TREATMENT:   Ther Ex  SciFit multi-peaks level 6 for 8 minutes using BUE/BLEs for neural priming for reciprocal movement, dynamic cardiovascular warmup and BLE strength. RPE of 0/10 following activity.   NMR  In // bars for improved dynamic standing balance, truncal stabilization, reaching out of BOS and UE coordination: -Cone reach and stack without UE support, starting on R side and progressing to contralateral side using RUE, one rep each side w/cones placed anterolaterally and one rep each side w/cones placed posterolaterally to further challenge reaching out of BOS. Min A required due to pt losing balance anteriorly and overcorrection of balance.  Pt frequently knocking cones over due to ataxia of RUE and seemingly frustrated with inability to  coordinate grasp w/RUE.  -Beanbag grab and throw (imitating cornhole) to further challenge anticipatory balance strategies and lateral reach. Pt performed 2 reps, with first rep w/basket on R side and second rep on L side. Pt with increased difficulty w/basket on L side and frequently relied on LUE support on rail for stability. Min A throughout for steadying assist.    Gait pattern: step through pattern, decreased stride length, ataxic, and trunk flexed Distance walked: various clinic distances  Assistive device utilized: Environmental consultant - 2 wheeled w/theraband tied on bottom for visual  targets for foot placement Level of assistance: CGA Comments: Pt continues to have difficulty w/RLE foot placement and variability between narrow/wide BOS but demonstrated improved symmetry w/step length and clearance     PATIENT EDUCATION: Education details:  Continue walking at home as able.  Continue HEP Person educated: Patient Education method: Medical illustrator Education comprehension: verbalized understanding and needs further education   HOME EXERCISE PROGRAM: Access Code: [email or web address] URL: [email or web address] Date: 11/26/2021 Prepared by: Camille Bal  Exercises - Side Stepping with Counter Support  - 1 x daily - 7 x weekly - 1 sets - 4 reps - Single Leg Bridge  - 1 x daily - 5 x weekly - 2 sets - 8 reps - Sit to Stand with Resistance Around Legs  - 1 x daily - 5 x weekly - 2 sets - 10 reps - Standing March with Counter Support  - 1 x daily - 7 x weekly - 3 sets - 10 reps - Standing Hip Extension with Counter Support  - 1 x daily - 7 x weekly - 3 sets - 10 reps - Feet Together Balance at [employer] Open  - 1 x daily - 7 x weekly - 3 sets - 10 reps  GOALS: Goals reviewed with patient? Yes  SHORT TERM GOALS: Target date: 12/10/2021  Pt will be independent with initial HEP for improved strength, balance, transfers and gait.  Baseline: Needs to be reviewed from previous bout of therapy  Goal status: INITIAL  2.  Pt will improve 5 x STS to less than or equal to 30s seconds w/BUE support and CGA to demonstrate improved functional strength and transfer efficiency.   Baseline: 35.63s w/BUE support and min A Goal status: INITIAL  3.  Pt will improve gait velocity to at least 0.9 ft/s w/LRAD for improved gait efficiency   Baseline: 0.70ft/s w/RW  Goal status: INITIAL  4.  Berg to be performed at 30 day STG assessment and LTG written  Baseline:  Goal status: INITIAL  5.  TUG to be performed at 30 day STG assessment and LTG written   Baseline:  Goal status: INITIAL  6.  Pt will ambulate 250' w/LRAD and CGA for improved functional mobility and endurance  Baseline:  Goal status: INITIAL  LONG TERM GOALS: Target date: 01/07/2022  Pt will improve 5 x STS to less than or equal to 25 seconds w/BUE support mod I to demonstrate improved functional strength and transfer efficiency.   Baseline: 35.63s w/BUE support and min A Goal status: INITIAL  2.  Pt will improve gait velocity to at least 1.4 ft/s w/LRAD for improved gait efficiency and performance at limited community ambulator level   Baseline: 0.61ft/s w/RW Goal status: INITIAL  3.  Berg goal Baseline:  Goal status: INITIAL  4.  TUG goal Baseline:  Goal status: INITIAL  5.  Pt will ambulate >400' w/RW and LRAD w/S* and without rest break for improved  functional mobility and endurance  Baseline:  Goal status: INITIAL   ASSESSMENT:  CLINICAL IMPRESSION: Emphasis of skilled PT session on improved dynamic standing balance, reaching out of BOS and UE coordination. Pt very challenged w/standing unsupported and assumes a very wide BOS for stability. Pt with increased difficulty reaching cross-body or rotating to R side. Pt frequently lost balance anterolaterally to L side, requiring min A and LUE support for stability. Continue POC.   OBJECTIVE IMPAIRMENTS Abnormal gait, decreased activity tolerance, decreased balance, decreased cognition, decreased coordination, decreased endurance, decreased mobility, difficulty walking, decreased safety awareness, and prosthetic dependency .   ACTIVITY LIMITATIONS carrying, lifting, bending, sitting, standing, squatting, stairs, transfers, bed mobility, reach over head, and locomotion level  PARTICIPATION LIMITATIONS: meal prep, cleaning, laundry, interpersonal relationship, driving, shopping, community activity, and yard work  PERSONAL FACTORS Age, Fitness, Past/current experiences, Time since onset of  injury/illness/exacerbation, Transportation, and 1 comorbidity: L BKA are also affecting patient's functional outcome.   REHAB POTENTIAL: Fair due to history of noncompliance w/PT and decreased social support system   CLINICAL DECISION MAKING: Evolving/moderate complexity  EVALUATION COMPLEXITY: Moderate  PLAN: PT FREQUENCY: 2x/week  PT DURATION: 8 weeks  PLANNED INTERVENTIONS: Therapeutic exercises, Therapeutic activity, Neuromuscular re-education, Balance training, Gait training, Patient/Family education, Self Care, Stair training, Visual/preceptual remediation/compensation, Prosthetic training, DME instructions, Manual therapy, and Re-evaluation  PLAN FOR NEXT SESSION: Modify/progress HEP prn, ladder drills, dot drills, SciFit intervals for endurance, SIT <>STANDS, eccentric control of quads, trial ankle wts w/ standing march vs steps in // bars, standing wide stance w/ band around thighs - banded STS seemed to provide helpful proprioceptive input    Nikaya Nasby E Alyce Inscore, PT, DPT 11/29/2021, 2:51 PM

## 2021-12-03 ENCOUNTER — Encounter: Payer: Self-pay | Admitting: Physical Therapy

## 2021-12-03 ENCOUNTER — Ambulatory Visit: Payer: Medicare Other | Admitting: Physical Therapy

## 2021-12-03 DIAGNOSIS — S88112A Complete traumatic amputation at level between knee and ankle, left lower leg, initial encounter: Secondary | ICD-10-CM

## 2021-12-03 DIAGNOSIS — R2689 Other abnormalities of gait and mobility: Secondary | ICD-10-CM | POA: Diagnosis not present

## 2021-12-03 DIAGNOSIS — M6281 Muscle weakness (generalized): Secondary | ICD-10-CM

## 2021-12-03 DIAGNOSIS — R278 Other lack of coordination: Secondary | ICD-10-CM

## 2021-12-03 NOTE — Therapy (Signed)
OUTPATIENT PHYSICAL THERAPY TREATMENT NOTE   Patient Name: Kristopher Ruiz. MRN: [id number] DOB:April 08, 1950, 71 y.o., male 61 Date: 12/03/2021  PCP: Jerre Simon, MD REFERRING PROVIDER: Billey Co, MD   END OF SESSION:   PT End of Session - 12/03/21 1406     Visit Number 5    Number of Visits 17   Plus eval   Date for PT Re-Evaluation 01/14/22    Authorization Type UHC Medicare/Medicaid of [place]    Progress Note Due on Visit 10    PT Start Time 1400    PT Stop Time 1443    PT Time Calculation (min) 43 min    Equipment Utilized During Treatment Gait belt    Activity Tolerance Patient tolerated treatment well    Behavior During Therapy WFL for tasks assessed/performed              Past Medical History:  Diagnosis Date   Arthritis    Cataract    NS OU   Hypertension    Stroke [facility])    Past Surgical History:  Procedure Laterality Date   HIP FRACTURE SURGERY     LEG AMPUTATION BELOW KNEE     left leg   TOTAL HIP ARTHROPLASTY Left 10/31/2019   TOTAL HIP ARTHROPLASTY Left 10/31/2019   Procedure: LEFT TOTAL HIP ARTHROPLASTY, POSTERIOR;  Surgeon: Tarry Kos, MD;  Location: [facility] OR;  Service: Orthopedics;  Laterality: Left;   Patient Active Problem List   Diagnosis Date Noted   Anemia 07/06/2020   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   Closed displaced fracture of left femoral neck with delayed healing 09/28/2019   Establishing care with new doctor, encounter for 06/01/2019   TIA (transient ischemic attack) 09/13/2018   Cerebellar stroke (HCC) 09/06/2018   Hypoglycemia due to insulin 09/06/2018   Tobacco abuse 09/06/2018   Hyperlipidemia 09/06/2018   Essential hypertension 09/06/2018   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HCC) 04/16/2017    REFERRING DIAG: I63.9 (ICD-10-CM) - Cerebellar stroke (HCC)   THERAPY DIAG:  Other abnormalities of gait and mobility  Other lack of coordination  Muscle weakness  (generalized)  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HCC)  Rationale for Evaluation and Treatment Rehabilitation  PERTINENT HISTORY: Hx of L BKA, CVA, HTN, 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  States his nurse went with him to the [facility] this morning where he used the treadmill and recumbent bike and bands to stretch out his arms.  He states after therapy today he will be wore out.  PAIN:  Are you having pain? No   OBJECTIVE: (objective measures completed at initial evaluation unless otherwise dated)   TODAY'S TREATMENT:   NMR  -RW navigation over gumdrops w/ RLE tap and advance over obstacle, CGA-minA w/ multimodal cues for limb placement and safety with advancement over obstacle, pt improves w/ inc repetition, completed 4 x 20' In // bars:  -Step onto w/ contra step over airex using alt LE x15 w/ inc BUE use during R limb advancement -Heel taps from airex x20 each LE w/ in UE use to allow inc time for correction of R foot placement, initially placing foot flat, encouraged to hit heel only to add motor control challenge for RLE  Gait pattern: step through pattern, decreased stride length, ataxic, and trunk flexed Distance walked: 230' Assistive device utilized: Environmental consultant -  2 wheeled w/theraband tied on bottom for visual targets for foot placement Level of assistance: CGA Comments: Pt has single instance of right foot scuff w/o LOB.  He appears more mindful of stopping mid-gait to adjust steps before advancing contralateral limb following cuing for initial instance this is needed.  He attempts to adjust upright posture, but demonstrates difficulty controlling posterior sway with limb advancement thus resuming slight fwd trunk flexion.  PATIENT EDUCATION: Education details:  Continue walking at home as able.  Continue HEP. Person educated: Patient Education method: Software engineer Education comprehension: verbalized understanding and needs further education   HOME EXERCISE PROGRAM: Access Code: [email or web address] URL: [email or web address] Date: 11/26/2021 Prepared by: Camille Bal  Exercises - Side Stepping with Counter Support  - 1 x daily - 7 x weekly - 1 sets - 4 reps - Single Leg Bridge  - 1 x daily - 5 x weekly - 2 sets - 8 reps - Sit to Stand with Resistance Around Legs  - 1 x daily - 5 x weekly - 2 sets - 10 reps - Standing March with Counter Support  - 1 x daily - 7 x weekly - 3 sets - 10 reps - Standing Hip Extension with Counter Support  - 1 x daily - 7 x weekly - 3 sets - 10 reps - Feet Together Balance at [employer] Open  - 1 x daily - 7 x weekly - 3 sets - 10 reps  GOALS: Goals reviewed with patient? Yes  SHORT TERM GOALS: Target date: 12/10/2021  Pt will be independent with initial HEP for improved strength, balance, transfers and gait.  Baseline: Needs to be reviewed from previous bout of therapy  Goal status: INITIAL  2.  Pt will improve 5 x STS to less than or equal to 30s seconds w/BUE support and CGA to demonstrate improved functional strength and transfer efficiency.   Baseline: 35.63s w/BUE support and min A Goal status: INITIAL  3.  Pt will improve gait velocity to at least 0.9 ft/s w/LRAD for improved gait efficiency   Baseline: 0.62ft/s w/RW  Goal status: INITIAL  4.  Berg to be performed at 30 day STG assessment and LTG written  Baseline:  Goal status: INITIAL  5.  TUG to be performed at 30 day STG assessment and LTG written  Baseline:  Goal status: INITIAL  6.  Pt will ambulate 250' w/LRAD and CGA for improved functional mobility and endurance  Baseline:  Goal status: INITIAL  LONG TERM GOALS: Target date: 01/07/2022  Pt will improve 5 x STS to less than or equal to 25 seconds w/BUE support mod I to demonstrate improved functional strength and transfer efficiency.   Baseline: 35.63s  w/BUE support and min A Goal status: INITIAL  2.  Pt will improve gait velocity to at least 1.4 ft/s w/LRAD for improved gait efficiency and performance at limited community ambulator level   Baseline: 0.5ft/s w/RW Goal status: INITIAL  3.  Berg goal Baseline:  Goal status: INITIAL  4.  TUG goal Baseline:  Goal status: INITIAL  5.  Pt will ambulate >400' w/RW and LRAD w/S* and without rest break for improved functional mobility and endurance  Baseline:  Goal status: INITIAL   ASSESSMENT:  CLINICAL IMPRESSION: Initiated skilled PT session with gait training focused on improving RLE placement during limb advancement.  He continues to have variability in BOS due to ataxia that worsens with trunk upright correction and increased reliance on UE support for  all balance tasks.  Progressed session to address dynamic limb placement using compliant surface to further challenge motor control.  Pt continues to benefit from ongoing PT POC to further address outlined deficits.  OBJECTIVE IMPAIRMENTS Abnormal gait, decreased activity tolerance, decreased balance, decreased cognition, decreased coordination, decreased endurance, decreased mobility, difficulty walking, decreased safety awareness, and prosthetic dependency .   ACTIVITY LIMITATIONS carrying, lifting, bending, sitting, standing, squatting, stairs, transfers, bed mobility, reach over head, and locomotion level  PARTICIPATION LIMITATIONS: meal prep, cleaning, laundry, interpersonal relationship, driving, shopping, community activity, and yard work  PERSONAL FACTORS Age, Fitness, Past/current experiences, Time since onset of injury/illness/exacerbation, Transportation, and 1 comorbidity: L BKA are also affecting patient's functional outcome.   REHAB POTENTIAL: Fair due to history of noncompliance w/PT and decreased social support system   CLINICAL DECISION MAKING: Evolving/moderate complexity  EVALUATION COMPLEXITY:  Moderate  PLAN: PT FREQUENCY: 2x/week  PT DURATION: 8 weeks  PLANNED INTERVENTIONS: Therapeutic exercises, Therapeutic activity, Neuromuscular re-education, Balance training, Gait training, Patient/Family education, Self Care, Stair training, Visual/preceptual remediation/compensation, Prosthetic training, DME instructions, Manual therapy, and Re-evaluation  PLAN FOR NEXT SESSION: Modify/progress HEP prn, ladder drills, dot drills, SciFit intervals for endurance, SIT <>STANDS, eccentric control of quads, trial ankle wts w/ standing march vs steps in // bars, standing wide stance w/ band around thighs - banded STS seemed to provide helpful proprioceptive input    Sadie Haber, PT, DPT 12/03/2021, 2:54 PM

## 2021-12-06 ENCOUNTER — Ambulatory Visit: Payer: Medicare Other | Admitting: Physical Therapy

## 2021-12-06 DIAGNOSIS — R278 Other lack of coordination: Secondary | ICD-10-CM

## 2021-12-06 DIAGNOSIS — M6281 Muscle weakness (generalized): Secondary | ICD-10-CM

## 2021-12-06 DIAGNOSIS — R2689 Other abnormalities of gait and mobility: Secondary | ICD-10-CM

## 2021-12-06 NOTE — Therapy (Signed)
OUTPATIENT PHYSICAL THERAPY TREATMENT NOTE   Patient Name: Kristopher Ruiz. MRN: [id number] DOB:December 28, 1950, 72 y.o., male 87 Date: 12/06/2021  PCP: Alen Bleacher, MD REFERRING PROVIDER: Lenoria Chime, MD   END OF SESSION:   PT End of Session - 12/06/21 1408     Visit Number 6    Number of Visits 17   Plus eval   Date for PT Re-Evaluation 01/14/22    Authorization Type UHC Medicare/Medicaid [place]    Progress Note Due on Visit 10    PT Start Time 1406   Pt using restroom   PT Stop Time 1445    PT Time Calculation (min) 39 min    Equipment Utilized During Treatment Gait belt    Activity Tolerance Patient tolerated treatment well    Behavior During Therapy WFL for tasks assessed/performed              Past Medical History:  Diagnosis Date   Arthritis    Cataract    NS OU   Hypertension    Stroke [facility])    Past Surgical History:  Procedure Laterality Date   HIP FRACTURE SURGERY     LEG AMPUTATION BELOW KNEE     left leg   TOTAL HIP ARTHROPLASTY Left 10/31/2019   TOTAL HIP ARTHROPLASTY Left 10/31/2019   Procedure: LEFT TOTAL HIP ARTHROPLASTY, POSTERIOR;  Surgeon: Leandrew Koyanagi, MD;  Location: [place];  Service: Orthopedics;  Laterality: Left;   Patient Active Problem List   Diagnosis Date Noted   Anemia 07/06/2020   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   Closed displaced fracture of left femoral neck with delayed healing 09/28/2019   Establishing care with new doctor, encounter for 06/01/2019   TIA (transient ischemic attack) 09/13/2018   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   Hypoglycemia due to insulin 09/06/2018   Tobacco abuse 09/06/2018   Hyperlipidemia 09/06/2018   Essential hypertension 09/06/2018   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 Exercises are going well.   PAIN:  Are you having pain? No   OBJECTIVE: (objective measures completed at initial evaluation unless otherwise dated)   TODAY'S TREATMENT:   Ther Act  Patient completed the Timed Up and Go test (TUG) w/CGA in 53.14,  64.24, and 60.47 seconds. Pt averaged a total time of 59.28s.  Geriatrics: need for further assessment of fall risk: ? 12 sec; Recurrent falls: > 15 sec; Vestibular Disorders fall risk: > 15 sec; Parkinson's Disease fall risk: > 16 sec ([email or web address], [email or web address])  Gait Training  Gait pattern: step through pattern, decreased stride length, ataxic, and trunk flexed Distance walked: 250' Assistive device utilized: Environmental consultant - 2 wheeled w/theraband tied on bottom for visual targets for foot placement Level of assistance: SBA and CGA Comments: Pt demonstrated improved R foot placement today, with very few instances of scissoring. Pt able to self correct body position inside walker and knows to stop and reset feet when he feels off balance. No LOB episodes throughout.   NMR  Standing dot drill w/HHA (min-mod A) for improved LE  coordination, visual scanning and single leg stability. Placed 5 dots of various colors in half circle in front of pt (9 o'clock to 3 o'clock). Therapist called out random color and challenged pt to take single step to dot of that color and single step back. Encouraged pt to use BLEs for drill but pt only using RLE, resulting in significant LOB due to crossover step when therapist called out color on L side. Progressed to placing dots in staggered line in front of pt and having patient tap to two colors, which pt had significant difficulty with. Ended intervention with significant LOB as pt overshot RLE  to dot and lost balance posteriorly, requiring max A to prevent fall.   PATIENT EDUCATION: Education details:  Continue walking at home as able.  Continue HEP. Person educated: Patient Education method: Customer service manager Education comprehension: verbalized understanding and needs further education   HOME EXERCISE PROGRAM: Access Code: [email or web address] URL: [email or web address] Date: 11/26/2021 Prepared by: Elease Etienne  Exercises - Side Stepping with Counter Support  - 1 x daily - 7 x weekly - 1 sets - 4 reps - Single Leg Bridge  - 1 x daily - 5 x weekly - 2 sets - 8 reps - Sit to Stand with Resistance Around Legs  - 1 x daily - 5 x weekly - 2 sets - 10 reps - Standing March with Counter Support  - 1 x daily - 7 x weekly - 3 sets - 10 reps - Standing Hip Extension with Counter Support  - 1 x daily - 7 x weekly - 3 sets - 10 reps - Feet Together Balance at [employer] Open  - 1 x daily - 7 x weekly - 3 sets - 10 reps  GOALS: Goals reviewed with patient? Yes  SHORT TERM GOALS: Target date: 12/10/2021  Pt will be independent with initial HEP for improved strength, balance, transfers and gait.  Baseline: Needs to be reviewed from previous bout of therapy  Goal status: INITIAL  2.  Pt will improve 5 x STS to less than or equal to 30s seconds w/BUE support and CGA to demonstrate improved functional strength and transfer efficiency.   Baseline: 35.63s w/BUE support and min A Goal status: INITIAL  3.  Pt will improve gait velocity to at least 0.9 ft/s w/LRAD for improved gait efficiency   Baseline: 0.40f/s w/RW  Goal status: INITIAL  4.  Berg to be performed at 30 day STG assessment and LTG written  Baseline:  Goal status: INITIAL  5.  TUG to be performed at 30 day STG assessment and LTG written  Baseline:  Goal status: MET  6.  Pt will ambulate 250' w/LRAD and CGA for improved functional mobility and endurance  Baseline:  Goal status:  MET  LONG TERM GOALS: Target date: 01/07/2022  Pt will improve 5 x STS to less than or equal to 25 seconds w/BUE support mod I to demonstrate improved functional strength and transfer efficiency.   Baseline: 35.63s w/BUE support and min A Goal status: INITIAL  2.  Pt will improve gait velocity to at least 1.4 ft/s w/LRAD for improved gait efficiency and performance at limited community ambulator level   Baseline: 0.657fs w/RW Goal status: INITIAL  3.  Berg goal Baseline:  Goal status: INITIAL  4.  Pt will improve normal TUG to less than or equal to an average of 45s w/RW and S* for improved functional mobility and decreased fall risk.  Baseline: AVG of 3  trials: 59.28s w/RW and CGA Goal status: REVISED  5.  Pt will ambulate >400' w/RW and LRAD w/S* and without rest break for improved functional mobility and endurance  Baseline:  Goal status: INITIAL   ASSESSMENT:  CLINICAL IMPRESSION: Emphasis of skilled PT session on starting STG assessment and BLE coordination. Pt has met 2/6 LTGs thus far, ambulating 250' w/RW and CGA and performing TUG. Pt averaged 59.28s on TUG w/RW due to difficulty sequencing turn to L side. Pt demonstrated improved foot placement during gait training w/no instance of LOB but continues to have to stop and reset due to narrow foot placement. Continue POC.    OBJECTIVE IMPAIRMENTS Abnormal gait, decreased activity tolerance, decreased balance, decreased cognition, decreased coordination, decreased endurance, decreased mobility, difficulty walking, decreased safety awareness, and prosthetic dependency .   ACTIVITY LIMITATIONS carrying, lifting, bending, sitting, standing, squatting, stairs, transfers, bed mobility, reach over head, and locomotion level  PARTICIPATION LIMITATIONS: meal prep, cleaning, laundry, interpersonal relationship, driving, shopping, community activity, and yard work  PERSONAL FACTORS Age, Fitness, Past/current experiences, Time since  onset of injury/illness/exacerbation, Transportation, and 1 comorbidity: L BKA are also affecting patient's functional outcome.   REHAB POTENTIAL: Fair due to history of noncompliance w/PT and decreased social support system   CLINICAL DECISION MAKING: Evolving/moderate complexity  EVALUATION COMPLEXITY: Moderate  PLAN: PT FREQUENCY: 2x/week  PT DURATION: 8 weeks  PLANNED INTERVENTIONS: Therapeutic exercises, Therapeutic activity, Neuromuscular re-education, Balance training, Gait training, Patient/Family education, Self Care, Stair training, Visual/preceptual remediation/compensation, Prosthetic training, DME instructions, Manual therapy, and Re-evaluation  PLAN FOR NEXT SESSION: Goal assessment, Modify/progress HEP prn, ladder drills, dot drills, SciFit intervals for endurance, SIT <>STANDS, eccentric control of quads, trial ankle wts w/ standing march vs steps in // bars, standing wide stance w/ band around thighs - banded STS seemed to provide helpful proprioceptive input    Camden Mazzaferro E Abdulahad Mederos, PT, DPT 12/06/2021, 2:58 PM

## 2021-12-10 ENCOUNTER — Ambulatory Visit: Payer: Medicare Other | Admitting: Physical Therapy

## 2021-12-10 ENCOUNTER — Encounter: Payer: Self-pay | Admitting: Physical Therapy

## 2021-12-10 DIAGNOSIS — R2689 Other abnormalities of gait and mobility: Secondary | ICD-10-CM | POA: Diagnosis not present

## 2021-12-10 DIAGNOSIS — R278 Other lack of coordination: Secondary | ICD-10-CM

## 2021-12-10 DIAGNOSIS — M6281 Muscle weakness (generalized): Secondary | ICD-10-CM

## 2021-12-10 DIAGNOSIS — S88112A Complete traumatic amputation at level between knee and ankle, left lower leg, initial encounter: Secondary | ICD-10-CM

## 2021-12-10 NOTE — Therapy (Signed)
OUTPATIENT PHYSICAL THERAPY TREATMENT NOTE   Patient Name: Kristopher Ruiz. MRN: [id number] DOB:May 16, 1950, 71 y.o., male 61 Date: 12/10/2021  PCP: Alen Bleacher, MD REFERRING PROVIDER: Lenoria Chime, MD   END OF SESSION:   PT End of Session - 12/10/21 1409     Visit Number 7    Number of Visits 17   Plus eval   Date for PT Re-Evaluation 01/14/22    Authorization Type UHC Medicare/Medicaid of [place]    Progress Note Due on Visit 10    PT Start Time 1404   pt late   PT Stop Time 1446    PT Time Calculation (min) 42 min    Equipment Utilized During Treatment Gait belt    Activity Tolerance Patient tolerated treatment well    Behavior During Therapy WFL for tasks assessed/performed              Past Medical History:  Diagnosis Date   Arthritis    Cataract    NS OU   Hypertension    Stroke [facility])    Past Surgical History:  Procedure Laterality Date   HIP FRACTURE SURGERY     LEG AMPUTATION BELOW KNEE     left leg   TOTAL HIP ARTHROPLASTY Left 10/31/2019   TOTAL HIP ARTHROPLASTY Left 10/31/2019   Procedure: LEFT TOTAL HIP ARTHROPLASTY, POSTERIOR;  Surgeon: Leandrew Koyanagi, MD;  Location: [place];  Service: Orthopedics;  Laterality: Left;   Patient Active Problem List   Diagnosis Date Noted   Anemia 07/06/2020   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   Closed displaced fracture of left femoral neck with delayed healing 09/28/2019   Establishing care with new doctor, encounter for 06/01/2019   TIA (transient ischemic attack) 09/13/2018   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   Hypoglycemia due to insulin 09/06/2018   Tobacco abuse 09/06/2018   Hyperlipidemia 09/06/2018   Essential hypertension 09/06/2018   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[place]) 04/16/2017    REFERRING DIAG: I63.9 (ICD-10-CM) - Cerebellar stroke (HCC)   THERAPY DIAG:  Other abnormalities of gait and mobility  Other lack of coordination  Muscle weakness  (generalized)  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HCC)  Rationale for Evaluation and Treatment Rehabilitation  PERTINENT HISTORY: Hx of L BKA, CVA, HTN, 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 Exercises are going well.   He states he did a UE exercise class in seated this morning at the [facility].  PAIN:  Are you having pain? No   OBJECTIVE: (objective measures completed at initial evaluation unless otherwise dated)   TODAY'S TREATMENT:   Ther Act  -Verbally reviewed HEP.  Pt has not tried standing hip ext at home.  Feels safe with setup of all exercises.  Discussed modified ROM for marches to prevent hitting knees on dresser.  He continues to work on the feet together activity with hands fully on the counter at home. -5xSTS (Attempt 1) w/ BUE support from w/c w/ wheels blocked - safely attempted 2 reps w/ inc fwd propulsion requiring modA to regain balance and place in sitting in w/c -5xSTS (Attempt 2) w/ BUE from standard chair CGA:  46.81 sec w/ improved stability w/ repetition, pauses b/w reps for balance due to truncal ataxia, tends to push fwd vs vertically to propel from sitting -5xSTS (Attempt 3) w/ BUE from standard chair:  45.44  sec, pt pushes fwd x1 w/ minA to correct into standing -Pt walks varying clinic distances w/ RW to and from 10MWT test site > 10MWT w/ RW and CGA: 45.62 sec = 0.22 m/sec OR 0.72 ft/sec -BERG:  [facility] PT Assessment - 12/10/21 1434       Standardized Balance Assessment   Standardized Balance Assessment Berg Balance Test      Berg Balance Test   Sit to Stand Able to stand  independently using hands    Standing Unsupported Able to stand 30 seconds unsupported    Sitting with Back Unsupported but Feet Supported on Floor or Stool Able to sit safely and securely 2 minutes    Stand to Sit Controls descent by using hands    Transfers Able to transfer with verbal  cueing and /or supervision    Standing Unsupported with Eyes Closed Able to stand 3 seconds    Standing Unsupported with Feet Together Needs help to attain position and unable to hold for 15 seconds   held ~4 sec prior to right oriented jerky mvmts causing LOB   From Standing, Reach Forward with Outstretched Arm Can reach forward >5 cm safely (2")    From Standing Position, Pick up Object from Floor Unable to pick up and needs supervision    From Standing Position, Turn to Look Behind Over each Shoulder Turn sideways only but maintains balance    Turn 360 Degrees Needs assistance while turning    Standing Unsupported, Alternately Place Feet on Step/Stool Needs assistance to keep from falling or unable to try    Standing Unsupported, One Foot in [employer] balance while stepping or standing    Standing on One Leg Unable to try or needs assist to prevent fall    Total Score 21    Berg comment: significant fall risk              PATIENT EDUCATION: Education details:  Progress towards goals.  Continue walking at home as able.  Continue HEP. Person educated: Patient Education method: Customer service manager Education comprehension: verbalized understanding and needs further education   HOME EXERCISE PROGRAM: Access Code: [email or web address] URL: [email or web address] Date: 11/26/2021 Prepared by: Elease Etienne  Exercises - Side Stepping with Counter Support  - 1 x daily - 7 x weekly - 1 sets - 4 reps - Single Leg Bridge  - 1 x daily - 5 x weekly - 2 sets - 8 reps - Sit to Stand with Resistance Around Legs  - 1 x daily - 5 x weekly - 2 sets - 10 reps - Standing March with Counter Support  - 1 x daily - 7 x weekly - 3 sets - 10 reps - Standing Hip Extension with Counter Support  - 1 x daily - 7 x weekly - 3 sets - 10 reps - Feet Together Balance at [employer] Open  - 1 x daily - 7 x weekly - 3 sets - 10 reps  GOALS: Goals reviewed with patient? Yes  SHORT TERM  GOALS: Target date: 12/10/2021  Pt will be independent with initial HEP for improved strength, balance, transfers and gait.  Baseline: 12/10/2021 Established and updated, pt compliant. Goal status: MET  2.  Pt will improve 5 x STS to less than or equal to 30s seconds w/BUE support and CGA to demonstrate improved functional strength and transfer efficiency.   Baseline: 35.63s w/BUE support and min A; 12/10/2021 45.44 sec w/ BUE support and 1 instance of minA  to correct fwd LOB Goal status: NOT MET  3.  Pt will improve gait velocity to at least 0.9 ft/s w/LRAD for improved gait efficiency   Baseline: 0.73f/s w/RW; 12/10/2021 0.72 ft/sec w/ RW Goal status: ONGOING  4.  Berg to be performed at 30 day STG assessment and LTG written  Baseline: 21/56 Goal status: MET  5.  TUG to be performed at 30 day STG assessment and LTG written  Baseline:  Goal status: MET  6.  Pt will ambulate 250' w/LRAD and CGA for improved functional mobility and endurance  Baseline:  Goal status: MET  LONG TERM GOALS: Target date: 01/07/2022  Pt will improve 5 x STS to less than or equal to 25 seconds w/BUE support mod I to demonstrate improved functional strength and transfer efficiency.   Baseline: 35.63s w/BUE support and min A Goal status: INITIAL  2.  Pt will improve gait velocity to at least 1.4 ft/s w/LRAD for improved gait efficiency and performance at limited community ambulator level   Baseline: 0.691fs w/RW Goal status: INITIAL  3.  Pt will increase BERG balance score to >/=26/56 to demonstrate improved static balance. Baseline: 21/56 Goal status: INITIAL  4.  Pt will improve normal TUG to less than or equal to an average of 45s w/RW and S* for improved functional mobility and decreased fall risk.  Baseline: AVG of 3 trials: 59.28s w/RW and CGA Goal status: REVISED  5.  Pt will ambulate >400' w/RW and LRAD w/S* and without rest break for improved functional mobility and endurance   Baseline:  Goal status: INITIAL   ASSESSMENT:  CLINICAL IMPRESSION: Finished assessing STGs this session with pt demonstrating modest improvement from evaluation.  His intermittently performs updated HEP at home with more recently added exercises being more challenging with less opportunity to practice these from time of assignment.  Repeated performance of 5xSTS yielded a best time of 45.44 sec using BUE support still requiring single instance of minA to correct forward loss of balance.  His gait velocity improved to 0.72 ft/sec which is not a significant difference for age bracket or diagnosis.  His BERG score today was a 21/56 which is a single point lower than assessment 1 month prior from most recent episode of care.  He continues to be limited by variable and persistent ataxia affecting safety with transfers and gait requiring use of UE support and AD for safest mobility at current.  Ongoing PT POC aimed at addressing these deficits as able in favor of achieving safest level of functional independence.  OBJECTIVE IMPAIRMENTS Abnormal gait, decreased activity tolerance, decreased balance, decreased cognition, decreased coordination, decreased endurance, decreased mobility, difficulty walking, decreased safety awareness, and prosthetic dependency .   ACTIVITY LIMITATIONS carrying, lifting, bending, sitting, standing, squatting, stairs, transfers, bed mobility, reach over head, and locomotion level  PARTICIPATION LIMITATIONS: meal prep, cleaning, laundry, interpersonal relationship, driving, shopping, community activity, and yard work  PERSONAL FACTORS Age, Fitness, Past/current experiences, Time since onset of injury/illness/exacerbation, Transportation, and 1 comorbidity: L BKA are also affecting patient's functional outcome.   REHAB POTENTIAL: Fair due to history of noncompliance w/PT and decreased social support system   CLINICAL DECISION MAKING: Evolving/moderate complexity  EVALUATION  COMPLEXITY: Moderate  PLAN: PT FREQUENCY: 2x/week  PT DURATION: 8 weeks  PLANNED INTERVENTIONS: Therapeutic exercises, Therapeutic activity, Neuromuscular re-education, Balance training, Gait training, Patient/Family education, Self Care, Stair training, Visual/preceptual remediation/compensation, Prosthetic training, DME instructions, Manual therapy, and Re-evaluation  PLAN FOR NEXT SESSION:  Modify/progress HEP prn,  ladder drills, dot drills, SciFit intervals for endurance, SIT <>STANDS-push up not fwd, eccentric control of quads, trial ankle wts w/ standing march vs steps in // bars, standing wide stance w/ band around thighs - banded STS seemed to provide helpful proprioceptive input, standing variable stance at countertop progressing to no UE support as able    Bary Richard, PT, DPT 12/10/2021, 3:19 PM

## 2021-12-13 ENCOUNTER — Ambulatory Visit: Payer: Medicare Other | Admitting: Physical Therapy

## 2021-12-13 DIAGNOSIS — R2689 Other abnormalities of gait and mobility: Secondary | ICD-10-CM | POA: Diagnosis not present

## 2021-12-13 DIAGNOSIS — M6281 Muscle weakness (generalized): Secondary | ICD-10-CM

## 2021-12-13 DIAGNOSIS — R2681 Unsteadiness on feet: Secondary | ICD-10-CM

## 2021-12-13 DIAGNOSIS — R260 Ataxic gait: Secondary | ICD-10-CM

## 2021-12-13 NOTE — Therapy (Signed)
OUTPATIENT PHYSICAL THERAPY TREATMENT NOTE   Patient Name: Kristopher Ruiz. MRN: [id number] DOB:January 03, 1951, 71 y.o., male 86 Date: 12/13/2021  PCP: Alen Bleacher, MD REFERRING PROVIDER: Lenoria Chime, MD   END OF SESSION:   PT End of Session - 12/13/21 1447     Visit Number 8    Number of Visits 17   Plus eval   Date for PT Re-Evaluation 01/14/22    Authorization Type UHC Medicare/Medicaid of [place]    Progress Note Due on Visit 10    PT Start Time 1445    PT Stop Time 1527    PT Time Calculation (min) 42 min    Equipment Utilized During Treatment Gait belt    Activity Tolerance Patient tolerated treatment well    Behavior During Therapy WFL for tasks assessed/performed               Past Medical History:  Diagnosis Date   Arthritis    Cataract    NS OU   Hypertension    Stroke [facility])    Past Surgical History:  Procedure Laterality Date   HIP FRACTURE SURGERY     LEG AMPUTATION BELOW KNEE     left leg   TOTAL HIP ARTHROPLASTY Left 10/31/2019   TOTAL HIP ARTHROPLASTY Left 10/31/2019   Procedure: LEFT TOTAL HIP ARTHROPLASTY, POSTERIOR;  Surgeon: Leandrew Koyanagi, MD;  Location: [place];  Service: Orthopedics;  Laterality: Left;   Patient Active Problem List   Diagnosis Date Noted   Anemia 07/06/2020   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   Closed displaced fracture of left femoral neck with delayed healing 09/28/2019   Establishing care with new doctor, encounter for 06/01/2019   TIA (transient ischemic attack) 09/13/2018   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   Hypoglycemia due to insulin 09/06/2018   Tobacco abuse 09/06/2018   Hyperlipidemia 09/06/2018   Essential hypertension 09/06/2018   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   PAIN:  Are you having pain? No   OBJECTIVE: (objective measures completed at initial evaluation unless otherwise dated)   TODAY'S TREATMENT:   NMR  -Pt performed 3 sit <>stands from low mat height w/RW in front of pt for improved transfers, immediate standing balance and global strength. Noted significant difficulty w/immediate standing balance due to truncal ataxia and incoordination, requiring min-mod A in standing due anterior LOB.  -Added 6# weighted vest (using L SOT harness w/2# weights on each anterior chest strap and two 1# weights on back handle) x5 reps to determine if added weight will provide tactile cue for stability. Pt able to perform 5 reps w/CGA but still noted increased anterior sway and bracing against mat table to stabilize in stance.  -Progressed to 8# weighted vest (using L SOT harness w/2# weights on each anterior chest strap and 4# weight on back handle) x8 reps and then 5x STS as below. Pt much more stable w/addition of added weight on back strap and able to stand without UE support and S* at top of rep. No  instability noted. "It is like I am getting a hug". Of note, pt performed 5x STS earlier this week and required 44-46s to perform. W/weight vest, pt able to perform in under 30s.    [facility] PT Assessment - 12/13/21 1500       Transfers   Five time sit to stand comments  29.87s w/8# weighted vest on, BUE support and RW             NMR -Gait Training  Gait pattern: step through pattern, decreased stride length, decreased hip/knee flexion- Right, decreased ankle dorsiflexion- Right, decreased ankle dorsiflexion- Left, ataxic, narrow BOS, poor foot clearance- Right, and poor foot clearance- Left Distance walked: 230' Assistive device utilized:  Environmental consultant - 2 wheeled and 8# weighted vest Level of assistance: CGA Comments: Min cues for reduced scissoring of RLE and for safe AD management w/turns and obstacle navigation, as pt tends to jerk the walker to the side to avoid an obstacle and subsequently lose his balance. Did not notice any improvement w/ataxic gait w/use of vest.   Gait pattern: Same as above Distance walked: 230' Assistive device utilized: Environmental consultant - 2 wheeled and 4# ankle weight on RLE Level of assistance: SBA Comments: with use of ankle weight, noted improved fluidity of placement of RLE and decreased scissoring without cues. Pt reports he feels as thought he can guide leg easier w/added weight.    Standing alt toe taps to 4" step w/4# ankle weight on RLE and no UE support, x2 per side w/min-max A for stability. Pt unable to bring RLE onto/off of step without max A due to truncal ataxia and BLE weakness, but is able to coordinate his foot to step w/use of ankle weight.    PATIENT EDUCATION: Education details:  Purpose of weighted vest on ataxia, continue HEP Person educated: Patient Education method: Customer service manager Education comprehension: verbalized understanding and needs further education   HOME EXERCISE PROGRAM: Access Code: [email or web address] URL: [email or web address] Date: 11/26/2021 Prepared by: Elease Etienne  Exercises - Side Stepping with Counter Support  - 1 x daily - 7 x weekly - 1 sets - 4 reps - Single Leg Bridge  - 1 x daily - 5 x weekly - 2 sets - 8 reps - Sit to Stand with Resistance Around Legs  - 1 x daily - 5 x weekly - 2 sets - 10 reps - Standing March with Counter Support  - 1 x daily - 7 x weekly - 3 sets - 10 reps - Standing Hip Extension with Counter Support  - 1 x daily - 7 x weekly - 3 sets - 10 reps - Feet Together Balance at [employer] Open  - 1 x daily - 7 x weekly - 3 sets - 10 reps  GOALS: Goals reviewed with patient? Yes  SHORT TERM GOALS: Target  date: 12/10/2021  Pt will be independent with initial HEP for improved strength, balance, transfers and gait.  Baseline: 12/10/2021 Established and updated, pt compliant. Goal status: MET  2.  Pt will improve 5 x STS to less than or equal to 30s seconds w/BUE support and CGA to demonstrate improved functional strength and transfer efficiency.   Baseline: 35.63s w/BUE support and min A; 12/10/2021 45.44 sec w/ BUE support and 1 instance of minA to correct fwd LOB Goal status: NOT MET  3.  Pt will improve gait velocity to at least 0.9 ft/s w/LRAD for improved gait efficiency   Baseline: 0.14f/s w/RW; 12/10/2021 0.72 ft/sec w/  RW Goal status: ONGOING  4.  Berg to be performed at 30 day STG assessment and LTG written  Baseline: 21/56 Goal status: MET  5.  TUG to be performed at 30 day STG assessment and LTG written  Baseline:  Goal status: MET  6.  Pt will ambulate 250' w/LRAD and CGA for improved functional mobility and endurance  Baseline:  Goal status: MET  LONG TERM GOALS: Target date: 01/07/2022  Pt will improve 5 x STS to less than or equal to 25 seconds w/BUE support mod I to demonstrate improved functional strength and transfer efficiency.   Baseline: 35.63s w/BUE support and min A Goal status: INITIAL  2.  Pt will improve gait velocity to at least 1.4 ft/s w/LRAD for improved gait efficiency and performance at limited community ambulator level   Baseline: 0.31f/s w/RW Goal status: INITIAL  3.  Pt will increase BERG balance score to >/=26/56 to demonstrate improved static balance. Baseline: 21/56 Goal status: INITIAL  4.  Pt will improve normal TUG to less than or equal to an average of 45s w/RW and S* for improved functional mobility and decreased fall risk.  Baseline: AVG of 3 trials: 59.28s w/RW and CGA Goal status: REVISED  5.  Pt will ambulate >400' w/RW and LRAD w/S* and without rest break for improved functional mobility and endurance  Baseline:  Goal  status: INITIAL   ASSESSMENT:  CLINICAL IMPRESSION: Emphasis of skilled PT session on improved truncal stability, immediate standing balance and LE coordination w/use of weighted vest. Pt responded well to heavier weight on vest and was able to stand unsupported without major postural sway for first time w/use of vest. Pt improved his 5x STS time from 45s on 9/12 to 29s today w/use of weighted vest as well. Continue POC.   OBJECTIVE IMPAIRMENTS Abnormal gait, decreased activity tolerance, decreased balance, decreased cognition, decreased coordination, decreased endurance, decreased mobility, difficulty walking, decreased safety awareness, and prosthetic dependency .   ACTIVITY LIMITATIONS carrying, lifting, bending, sitting, standing, squatting, stairs, transfers, bed mobility, reach over head, and locomotion level  PARTICIPATION LIMITATIONS: meal prep, cleaning, laundry, interpersonal relationship, driving, shopping, community activity, and yard work  PERSONAL FACTORS Age, Fitness, Past/current experiences, Time since onset of injury/illness/exacerbation, Transportation, and 1 comorbidity: L BKA are also affecting patient's functional outcome.   REHAB POTENTIAL: Fair due to history of noncompliance w/PT and decreased social support system   CLINICAL DECISION MAKING: Evolving/moderate complexity  EVALUATION COMPLEXITY: Moderate  PLAN: PT FREQUENCY: 2x/week  PT DURATION: 8 weeks  PLANNED INTERVENTIONS: Therapeutic exercises, Therapeutic activity, Neuromuscular re-education, Balance training, Gait training, Patient/Family education, Self Care, Stair training, Visual/preceptual remediation/compensation, Prosthetic training, DME instructions, Manual therapy, and Re-evaluation  PLAN FOR NEXT SESSION:  Experiment w/weighted vest/adding weight to pelvis w/gait. Modify/progress HEP prn, ladder drills, dot drills, SciFit intervals for endurance, SIT <>STANDS-push up not fwd, eccentric control of  quads, trial ankle wts w/ standing march vs steps in // bars, standing wide stance w/ band around thighs - banded STS seemed to provide helpful proprioceptive input, standing variable stance at countertop progressing to no UE support as able    JCruzita LedererPlaster, PT, DPT 12/13/2021, 3:42 PM

## 2021-12-17 ENCOUNTER — Encounter: Payer: Self-pay | Admitting: Physical Therapy

## 2021-12-17 ENCOUNTER — Ambulatory Visit: Payer: Medicare Other | Admitting: Physical Therapy

## 2021-12-17 DIAGNOSIS — R2689 Other abnormalities of gait and mobility: Secondary | ICD-10-CM

## 2021-12-17 DIAGNOSIS — R2681 Unsteadiness on feet: Secondary | ICD-10-CM

## 2021-12-17 DIAGNOSIS — M6281 Muscle weakness (generalized): Secondary | ICD-10-CM

## 2021-12-17 NOTE — Therapy (Signed)
OUTPATIENT PHYSICAL THERAPY TREATMENT NOTE   Patient Name: Kristopher Ruiz. MRN: [id number] DOB:04-Jan-1951, 71 y.o., male Today's Date: 12/17/2021  PCP: Alen Bleacher, MD REFERRING PROVIDER: Lenoria Chime, MD   END OF SESSION:   PT End of Session - 12/17/21 1406     Visit Number 9    Number of Visits 17   Plus eval   Date for PT Re-Evaluation 01/14/22    Authorization Type UHC Medicare/Medicaid of [place]    Progress Note Due on Visit 10    PT Start Time 1405    PT Stop Time 1444    PT Time Calculation (min) 39 min    Equipment Utilized During Treatment Gait belt;Other (comment)   ace wraps, weights on SOT vest   Activity Tolerance Patient tolerated treatment well    Behavior During Therapy WFL for tasks assessed/performed               Past Medical History:  Diagnosis Date   Arthritis    Cataract    NS OU   Hypertension    Stroke [facility])    Past Surgical History:  Procedure Laterality Date   HIP FRACTURE SURGERY     LEG AMPUTATION BELOW KNEE     left leg   TOTAL HIP ARTHROPLASTY Left 10/31/2019   TOTAL HIP ARTHROPLASTY Left 10/31/2019   Procedure: LEFT TOTAL HIP ARTHROPLASTY, POSTERIOR;  Surgeon: Leandrew Koyanagi, MD;  Location: [place];  Service: Orthopedics;  Laterality: Left;   Patient Active Problem List   Diagnosis Date Noted   Anemia 07/06/2020   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   Closed displaced fracture of left femoral neck with delayed healing 09/28/2019   Establishing care with new doctor, encounter for 06/01/2019   TIA (transient ischemic attack) 09/13/2018   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   Hypoglycemia due to insulin 09/06/2018   Tobacco abuse 09/06/2018   Hyperlipidemia 09/06/2018   Essential hypertension 09/06/2018   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[place]) 04/16/2017    REFERRING DIAG: I63.9 (ICD-10-CM) - Cerebellar stroke (HCC)   THERAPY DIAG:  Muscle weakness (generalized)  Unsteadiness on feet  Other  abnormalities of gait and mobility  Rationale for Evaluation and Treatment Rehabilitation  PERTINENT HISTORY: Hx of L BKA, CVA, HTN, 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   PAIN:  Are you having pain? No   OBJECTIVE: (objective measures completed at initial evaluation unless otherwise dated)    TODAY'S TREATMENT:  BALANCE/NMR: Continued with use of 9# weighted vest (using L SOT harness w/2# weights on each anterior chest strap and # weight on back handle) and 3# ankle weights to each LE with session  Sit<>stands at edge of mat with weights and ace wraps donned with varied assist from mod assist downgrading to min/CGA. With with improved balance upon standing when prosthesis placed forward and scooted toward edge of mat with cues for anterior weight shifting. In any of these are not in place pt could need up to mod assist for balance upon standing vs retropulsion on mat with standing.     GAIT: Gait pattern: step through pattern, decreased stride length, decreased hip/knee flexion- Right, decreased ankle dorsiflexion- Right, decreased ankle dorsiflexion- Left, ataxic, narrow BOS, poor foot clearance- Right, and poor foot clearance- Left Distance walked: 230 x1  with weighted vest  and ankle weights, 115 x1 with same weight set up and addition of ace wraps to pelvis/waist for increased compression/feedback Assistive device utilized: Walker - 2 wheeled and weight vest, weights on ankles and ace wraps Level of assistance: CGA and Min A Comments: CGA with bouts of min A during gait due to truncal ataxia. This improved some with use of ace wraps for compression/feed back with weighted vest with less bouts of min A needed.        PATIENT EDUCATION: Education details:  Purpose of compression with ace wraps with weighted vest; continue HEP Person educated:  Patient Education method: Explanation and Demonstration Education comprehension: verbalized understanding and needs further education   HOME EXERCISE PROGRAM: Access Code: [email or web address] URL: [email or web address] Date: 11/26/2021 Prepared by: Elease Etienne  Exercises - Side Stepping with Counter Support  - 1 x daily - 7 x weekly - 1 sets - 4 reps - Single Leg Bridge  - 1 x daily - 5 x weekly - 2 sets - 8 reps - Sit to Stand with Resistance Around Legs  - 1 x daily - 5 x weekly - 2 sets - 10 reps - Standing March with Counter Support  - 1 x daily - 7 x weekly - 3 sets - 10 reps - Standing Hip Extension with Counter Support  - 1 x daily - 7 x weekly - 3 sets - 10 reps - Feet Together Balance at [employer] Open  - 1 x daily - 7 x weekly - 3 sets - 10 reps  GOALS: Goals reviewed with patient? Yes  SHORT TERM GOALS: Target date: 12/10/2021  Pt will be independent with initial HEP for improved strength, balance, transfers and gait. Baseline: 12/10/2021 Established and updated, pt compliant. Goal status: MET  2.  Pt will improve 5 x STS to less than or equal to 30s seconds w/BUE support and CGA to demonstrate improved functional strength and transfer efficiency.  Baseline: 35.63s w/BUE support and min A; 12/10/2021 45.44 sec w/ BUE support and 1 instance of minA to correct fwd LOB Goal status: NOT MET  3.  Pt will improve gait velocity to at least 0.9 ft/s w/LRAD for improved gait efficiency  Baseline: 0.30f/s w/RW; 12/10/2021 0.72 ft/sec w/ RW Goal status: ONGOING  4.  Berg to be performed at 30 day STG assessment and LTG written  Baseline: 21/56 Goal status: MET  5.  TUG to be performed at 30 day STG assessment and LTG written  Baseline:  Goal status: MET  6.  Pt will ambulate 250' w/LRAD and CGA for improved functional mobility and endurance  Baseline:  Goal status: MET  LONG TERM GOALS: Target date: 01/07/2022  Pt will improve 5 x STS to less than or  equal to 25 seconds w/BUE support mod I to demonstrate improved functional strength and transfer efficiency.  Baseline: 35.63s w/BUE support and min A Goal status: INITIAL  2.  Pt will improve gait velocity to at least 1.4 ft/s w/LRAD for improved gait efficiency and performance at limited community ambulator level  Baseline: 0.667fs w/RW Goal status: INITIAL  3.  Pt will increase BERG balance score to >/=26/56 to demonstrate improved static balance. Baseline: 21/56 Goal status: INITIAL  4.  Pt will improve normal TUG to less than or equal to an average of 45s w/RW and S* for improved functional mobility and decreased fall risk. Baseline: AVG of 3 trials: 59.28s w/RW and CGA Goal status: REVISED  5.  Pt will ambulate >  400' w/RW and LRAD w/S* and without rest break for improved functional mobility and endurance  Baseline:  Goal status: INITIAL   ASSESSMENT:  CLINICAL IMPRESSION: Today's skilled session continued to focus on decreasing ataxia with transfers and gait with use of weight/vest and addition of ace wraps to pelvis/trunk today. Decreased bouts of min assist needed with gait with use of all these interventions. The pt is making progress and should benefit from continued PT to progress toward goals.  OBJECTIVE IMPAIRMENTS Abnormal gait, decreased activity tolerance, decreased balance, decreased cognition, decreased coordination, decreased endurance, decreased mobility, difficulty walking, decreased safety awareness, and prosthetic dependency .   ACTIVITY LIMITATIONS carrying, lifting, bending, sitting, standing, squatting, stairs, transfers, bed mobility, reach over head, and locomotion level  PARTICIPATION LIMITATIONS: meal prep, cleaning, laundry, interpersonal relationship, driving, shopping, community activity, and yard work  PERSONAL FACTORS Age, Fitness, Past/current experiences, Time since onset of injury/illness/exacerbation, Transportation, and 1 comorbidity: L BKA are  also affecting patient's functional outcome.   REHAB POTENTIAL: Fair due to history of noncompliance w/PT and decreased social support system   CLINICAL DECISION MAKING: Evolving/moderate complexity  EVALUATION COMPLEXITY: Moderate  PLAN: PT FREQUENCY: 2x/week  PT DURATION: 8 weeks  PLANNED INTERVENTIONS: Therapeutic exercises, Therapeutic activity, Neuromuscular re-education, Balance training, Gait training, Patient/Family education, Self Care, Stair training, Visual/preceptual remediation/compensation, Prosthetic training, DME instructions, Manual therapy, and Re-evaluation  PLAN FOR NEXT SESSION:   10th visit progress note due. Experiment w/weighted vest/adding weight to pelvis w/gait. Modify/progress HEP prn, ladder drills, dot drills, SciFit intervals for endurance, SIT <>STANDS-push up not fwd, eccentric control of quads, trial ankle wts w/ standing march vs steps in // bars, standing wide stance w/ band around thighs - banded STS seemed to provide helpful proprioceptive input, standing variable stance at countertop progressing to no UE support as able    Willow Ora, PTA, [place] [address], [place], [place] [address] [phone] 12/17/21, 4:30 PM

## 2021-12-20 ENCOUNTER — Ambulatory Visit: Payer: Medicare Other | Admitting: Physical Therapy

## 2021-12-20 DIAGNOSIS — R260 Ataxic gait: Secondary | ICD-10-CM

## 2021-12-20 DIAGNOSIS — R2689 Other abnormalities of gait and mobility: Secondary | ICD-10-CM | POA: Diagnosis not present

## 2021-12-20 DIAGNOSIS — M6281 Muscle weakness (generalized): Secondary | ICD-10-CM

## 2021-12-20 DIAGNOSIS — R278 Other lack of coordination: Secondary | ICD-10-CM

## 2021-12-20 NOTE — Therapy (Signed)
OUTPATIENT PHYSICAL THERAPY TREATMENT NOTE-10TH VISIT PROGRESS NOTE   Patient Name: Kristopher Ruiz. MRN: [id number] DOB:Oct 21, 1950, 71 y.o., male 57 Date: 12/20/2021  PCP: Alen Bleacher, MD REFERRING PROVIDER: Lenoria Chime, MD   Physical Therapy Progress Note   Dates of Reporting Period:11/12/21 - 12/20/21  See Note below for Objective Data and Assessment of Progress/Goals.  Thank you for the referral of this patient. Mickie Bail Lasha Echeverria, PT, DPT   END OF SESSION:   PT End of Session - 12/20/21 1404     Visit Number 10    Number of Visits 17   Plus eval   Date for PT Re-Evaluation 01/14/22    Authorization Type UHC Medicare/Medicaid of [place]    Progress Note Due on Visit 10    PT Start Time 1402    PT Stop Time 1445    PT Time Calculation (min) 43 min    Equipment Utilized During Treatment Gait belt;Other (comment)   weights on SOT vest and gait belt   Activity Tolerance Patient tolerated treatment well    Behavior During Therapy WFL for tasks assessed/performed               Past Medical History:  Diagnosis Date   Arthritis    Cataract    NS OU   Hypertension    Stroke [facility])    Past Surgical History:  Procedure Laterality Date   HIP FRACTURE SURGERY     LEG AMPUTATION BELOW KNEE     left leg   TOTAL HIP ARTHROPLASTY Left 10/31/2019   TOTAL HIP ARTHROPLASTY Left 10/31/2019   Procedure: LEFT TOTAL HIP ARTHROPLASTY, POSTERIOR;  Surgeon: Leandrew Koyanagi, MD;  Location: [place];  Service: Orthopedics;  Laterality: Left;   Patient Active Problem List   Diagnosis Date Noted   Anemia 07/06/2020   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   Closed displaced fracture of left femoral neck with delayed healing 09/28/2019   Establishing care with new doctor, encounter for 06/01/2019   TIA (transient ischemic attack) 09/13/2018   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   Hypoglycemia due to insulin 09/06/2018   Tobacco abuse 09/06/2018   Hyperlipidemia  09/06/2018   Essential hypertension 09/06/2018   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   PAIN:  Are you having pain? No   OBJECTIVE: (objective measures completed at initial evaluation unless otherwise dated)   TODAY'S TREATMENT:   NMR -Donned 12# weighted vest (using L SOT harness w/3# weights on each anterior chest strap and 6# weight on back handle) and 2# weights to greater trochanters (attached to gait belt) and performed x2 sit <>stands from mat table. Noted improved immediate standing balance but pt still requiring min A for anterior LOB correction 2/2 truncal ataxia. Pt then able to stand unsupported for 1 minute and CGA w/minor A/P sway but no LOB. Noted improved stability this session w/addition of weights to greater trochanters   In // bars for improved lateral weight shifting, BLE coordination and dynamic stability w/gait:  -Alt toe taps to 4"  step w/light to no UE support, x10 reps. Min tactile cues provided to pelvis to facilitate lateral weight shifting, as pt does not shift weight well to R side. Pt able to tap to step well but had greatest difficulty maintaining trunk position without UE support, frequently losing balance anteriorly.  -Fwd/retro gait without UE support, down and back x1 in bars w/min-mod A for lateral weight shift facilitation and truncal stability. Pt demonstrated increased ease of gait in retro position but poor weight shift to R side, resulting in poor step length of LLE.  -On rockerboard in L/R direction/practiced  lateral weight shifting w/BUE support progressing to no UE support using mirror as visual biofeedback for body position x5 minutes. Min tactile cues provided to pelvis to facilitate weight shift, as pt relying on excessive lateral trunk lean to perform. Min-mod A for balance correction and stability, as pt unable to facilitate weight shift from legs/pelvis and relies on leaning w/trunk.    NMR -Gait Training  Gait pattern: step through pattern, decreased stride length, decreased hip/knee flexion- Right, decreased ankle dorsiflexion- Right, decreased ankle dorsiflexion- Left, ataxic, narrow BOS, poor foot clearance- Right, and poor foot clearance- Left Distance walked: 230' + various clinic distances  Assistive device utilized: Environmental consultant - 2 wheeled and 12# weighted vest w/2# weights on each greater trochanter (attached to gait belt) Level of assistance: CGA Comments: Min cues for reduced reliance on BUEs and to facilitate upward gaze, as pt has downward gaze which leads to pt losing balance anteriorly. Cued pt to use mirror in gym as visual biofeedback on body position, but due to saccadic intrusions, pt has difficulty focusing on mirror.    PATIENT EDUCATION: Education details:  Continue HEP Person educated: Patient Education method: Customer service manager Education comprehension: verbalized understanding and needs further education   HOME EXERCISE PROGRAM: Access Code: [email or web address] URL: [email or web address] Date: 11/26/2021 Prepared by: Elease Etienne  Exercises - Side Stepping with Counter Support  - 1 x daily - 7 x weekly - 1 sets - 4 reps - Single Leg Bridge  - 1 x daily - 5 x weekly - 2 sets - 8 reps - Sit to Stand with Resistance Around Legs  - 1 x daily - 5 x weekly - 2 sets - 10 reps - Standing March with Counter Support  - 1 x daily - 7 x weekly - 3 sets - 10 reps - Standing Hip Extension with Counter Support  - 1 x daily - 7 x weekly - 3 sets - 10 reps -  Feet Together Balance at [employer] Open  - 1 x daily - 7 x weekly - 3 sets - 10 reps  GOALS: Goals reviewed with patient? Yes  SHORT TERM GOALS: Target date: 12/10/2021  Pt will be independent with initial HEP for improved strength, balance, transfers and gait.  Baseline: 12/10/2021 Established and updated, pt compliant. Goal status: MET  2.  Pt will improve 5 x STS to less than or equal to 30s seconds w/BUE support and CGA to demonstrate improved functional strength and transfer efficiency.   Baseline: 35.63s w/BUE support and min A; 12/10/2021 45.44 sec w/ BUE support and 1 instance of minA to correct fwd LOB Goal status: NOT MET  3.  Pt will improve gait velocity to at least 0.9 ft/s w/LRAD for improved gait efficiency   Baseline: 0.16f/s w/RW; 12/10/2021 0.72 ft/sec w/ RW Goal status: ONGOING  4.  Berg to be performed at 30 day STG assessment and  LTG written  Baseline: 21/56 Goal status: MET  5.  TUG to be performed at 30 day STG assessment and LTG written  Baseline:  Goal status: MET  6.  Pt will ambulate 250' w/LRAD and CGA for improved functional mobility and endurance  Baseline:  Goal status: MET  LONG TERM GOALS: Target date: 01/07/2022  Pt will improve 5 x STS to less than or equal to 25 seconds w/BUE support mod I to demonstrate improved functional strength and transfer efficiency.   Baseline: 35.63s w/BUE support and min A Goal status: INITIAL  2.  Pt will improve gait velocity to at least 1.4 ft/s w/LRAD for improved gait efficiency and performance at limited community ambulator level   Baseline: 0.10f/s w/RW Goal status: INITIAL  3.  Pt will increase BERG balance score to >/=26/56 to demonstrate improved static balance. Baseline: 21/56 Goal status: INITIAL  4.  Pt will improve normal TUG to less than or equal to an average of 45s w/RW and S* for improved functional mobility and decreased fall risk.  Baseline: AVG of 3 trials: 59.28s w/RW and  CGA Goal status: REVISED  5.  Pt will ambulate >400' w/RW and LRAD w/S* and without rest break for improved functional mobility and endurance  Baseline:  Goal status: INITIAL   ASSESSMENT:  CLINICAL IMPRESSION: Emphasis of skilled PT session on gait training w/weighted vest, BLE coordination and lateral weight shifting. Pt continues to demonstrate improved truncal stability w/use of weighted vest. Noted pt has reduced weight shift to R side and maintained downward gaze, resulting in poor LLE foot placement and anterior LOB. Will continue to practice w/weighted vest in clinic for improved coordination and stability w/dynamic tasks. Continue POC.   OBJECTIVE IMPAIRMENTS Abnormal gait, decreased activity tolerance, decreased balance, decreased cognition, decreased coordination, decreased endurance, decreased mobility, difficulty walking, decreased safety awareness, and prosthetic dependency .   ACTIVITY LIMITATIONS carrying, lifting, bending, sitting, standing, squatting, stairs, transfers, bed mobility, reach over head, and locomotion level  PARTICIPATION LIMITATIONS: meal prep, cleaning, laundry, interpersonal relationship, driving, shopping, community activity, and yard work  PERSONAL FACTORS Age, Fitness, Past/current experiences, Time since onset of injury/illness/exacerbation, Transportation, and 1 comorbidity: L BKA are also affecting patient's functional outcome.   REHAB POTENTIAL: Fair due to history of noncompliance w/PT and decreased social support system   CLINICAL DECISION MAKING: Evolving/moderate complexity  EVALUATION COMPLEXITY: Moderate  PLAN: PT FREQUENCY: 2x/week  PT DURATION: 8 weeks  PLANNED INTERVENTIONS: Therapeutic exercises, Therapeutic activity, Neuromuscular re-education, Balance training, Gait training, Patient/Family education, Self Care, Stair training, Visual/preceptual remediation/compensation, Prosthetic training, DME instructions, Manual therapy, and  Re-evaluation  PLAN FOR NEXT SESSION:  Experiment w/weighted vest/adding weight to pelvis w/gait. Modify/progress HEP prn, ladder drills, dot drills, SciFit intervals for endurance, SIT <>STANDS-push up not fwd, eccentric control of quads, trial ankle wts w/ standing march vs steps in // bars, standing wide stance w/ band around thighs - banded STS seemed to provide helpful proprioceptive input, standing variable stance at countertop progressing to no UE support as able    JCruzita LedererPlaster, PT, DPT 12/20/2021, 2:57 PM

## 2021-12-24 ENCOUNTER — Ambulatory Visit: Payer: Medicare Other | Admitting: Physical Therapy

## 2021-12-24 ENCOUNTER — Encounter: Payer: Self-pay | Admitting: Student

## 2021-12-24 ENCOUNTER — Ambulatory Visit (INDEPENDENT_AMBULATORY_CARE_PROVIDER_SITE_OTHER): Payer: Medicare Other | Admitting: Student

## 2021-12-24 VITALS — BP 122/74 | HR 95 | Ht 63.0 in | Wt 158.0 lb

## 2021-12-24 DIAGNOSIS — Z139 Encounter for screening, unspecified: Secondary | ICD-10-CM | POA: Diagnosis not present

## 2021-12-24 DIAGNOSIS — R2689 Other abnormalities of gait and mobility: Secondary | ICD-10-CM | POA: Diagnosis not present

## 2021-12-24 DIAGNOSIS — R2681 Unsteadiness on feet: Secondary | ICD-10-CM

## 2021-12-24 DIAGNOSIS — M6281 Muscle weakness (generalized): Secondary | ICD-10-CM

## 2021-12-24 DIAGNOSIS — Z0000 Encounter for general adult medical examination without abnormal findings: Secondary | ICD-10-CM

## 2021-12-24 DIAGNOSIS — R260 Ataxic gait: Secondary | ICD-10-CM

## 2021-12-24 DIAGNOSIS — R278 Other lack of coordination: Secondary | ICD-10-CM

## 2021-12-24 DIAGNOSIS — Z23 Encounter for immunization: Secondary | ICD-10-CM | POA: Diagnosis not present

## 2021-12-24 NOTE — Progress Notes (Signed)
    SUBJECTIVE:   Chief compliant/HPI: annual examination  Kristopher Ruiz. is a 71 y.o. male who presents today for his Annual Wellness Visit. He reports consuming a general diet. Home exercise routine includes stretching and balancing exercise with PT twice weekly. He generally feels fairly well. He reports sleeping poorly, hard to fall asleep. He does not have additional problems to discuss today.   Medical concerns:  No medical concerns other than decreased vision on the left eye. This is been going on over 6 years since he had the surgery for cataract. He follows an ophthalmologist and has an appointment in 2 weeks, on the 13th of October.  Living arrangement: Lives with a roommate who he rent the house from.Roommate is Iona Beard [phone]) Work : Retired Sexual activity: Not sexually activie Tobacco Use: Smokes a cigarette a day and has quit in the past Alcohol use: No use  Drug use: Occasionally do marijuana but no illicit drugs   Reviewed and updated history.   OBJECTIVE:   BP 122/74   Pulse 95   Ht 5\' 3"  (1.6 m)   Wt 158 lb (71.7 kg)   SpO2 96%   BMI 27.99 kg/m    General: Alert, well appearing, NAD HEENT: Atraumatic, MMM, No sclera icterus CV: RRR, no murmurs, normal S1/S2 Pulm: CTAB, good WOB on RA, no crackles or wheezing Abd: Soft, no distension, no tenderness Skin: dry, warm Ext: No BLE edema, +2 Pedal and radial pulse.   ASSESSMENT/PLAN:   71 year old male with no medical concerns at this time or acute problems. Patient is unsure what medications he should be taking. Currently only taking 2 BP medications. Advised patient to bring his medications to his follow up visit in 2 weeks with the pharmacy team. Discussed smoking cessation with patient who at this time is contemplating discontinuing tobaccos use.   Annual Examination  See AVS for age appropriate recommendations  Blood pressure reviewed and at goal.      Considered the following items based upon  USPSTF recommendations: HIV testing: not indicated Hepatitis C: declined Hepatitis B: not indicated Syphilis if at high risk: {not indicated GC/CTnot indicated Lipid panel (nonfasting or fasting) discussed based upon AHA recommendations and ordered.  Consider repeat every 4-6 years.  Reviewed risk factors for latent tuberculosis and not indicated Immunizations- Patient received his flu vaccine.  Risk and benefits reviewed with patient.   Follow up in 1 year or sooner if indicated.   HCM -Place GI referral for colonoscopy.   Alen Bleacher, MD [place]

## 2021-12-24 NOTE — Patient Instructions (Addendum)
It was wonderful to meet you today. Thank you for allowing me to be a part of your care. Below is a short summary of what we discussed at your visit today:  We collected lab to check your Cholesterol levels. And you received your Flu vaccine today.  You are due for your colonoscopy. I have placed a referral for you to your colonoscopy complicated.  You should receive a call to schedule that.  You can get the Shingrix vaccine at the pharmacy. You will need a 2nd shot 2-6 months after the first. This vaccine is important to help prevent shingles.   Follow-up in 2 weeks with pharmacy team  to review your medications.  And please bring your medications for review with pharmacy.  Please bring all of your medications to every appointment!  If you have any questions or concerns, please do not hesitate to contact us via phone or MyChart message.   Alen Bleacher, MD [facility]

## 2021-12-24 NOTE — Therapy (Signed)
OUTPATIENT PHYSICAL THERAPY TREATMENT NOTE   Patient Name: Kristopher Ruiz. MRN: [id number] DOB:[birth year], 71 y.o., male 35 Date: 12/24/2021  PCP: Alen Bleacher, MD REFERRING PROVIDER: Lenoria Chime, MD     END OF SESSION:   PT End of Session - 12/24/21 1037     Visit Number 11    Number of Visits 17   Plus eval   Date for PT Re-Evaluation 01/14/22    Authorization Type UHC Medicare/Medicaid of [place]    Progress Note Due on Visit 10    PT Start Time 1036    PT Stop Time 1117    PT Time Calculation (min) 41 min    Equipment Utilized During Treatment Gait belt;Other (comment)   20# weighted vest   Activity Tolerance Patient tolerated treatment well    Behavior During Therapy WFL for tasks assessed/performed                Past Medical History:  Diagnosis Date   Arthritis    Cataract    NS OU   Hypertension    Stroke [facility])    Past Surgical History:  Procedure Laterality Date   HIP FRACTURE SURGERY     LEG AMPUTATION BELOW KNEE     left leg   TOTAL HIP ARTHROPLASTY Left 10/31/2019   TOTAL HIP ARTHROPLASTY Left 10/31/2019   Procedure: LEFT TOTAL HIP ARTHROPLASTY, POSTERIOR;  Surgeon: Leandrew Koyanagi, MD;  Location: [place];  Service: Orthopedics;  Laterality: Left;   Patient Active Problem List   Diagnosis Date Noted   Anemia 07/06/2020   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   Closed displaced fracture of left femoral neck with delayed healing 09/28/2019   Establishing care with new doctor, encounter for 06/01/2019   TIA (transient ischemic attack) 09/13/2018   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 Had a Dr.'s appointment prior to session, "I did not have time to shower or nothin". No new changes   PAIN:  Are you having pain? No   OBJECTIVE: (objective measures completed at initial evaluation unless otherwise dated)   TODAY'S TREATMENT:  NMR -Gait Training  Gait pattern: step through pattern, decreased stride length, decreased hip/knee flexion- Right, decreased ankle dorsiflexion- Right, decreased ankle dorsiflexion- Left, ataxic, wide BOS, poor foot clearance- Right, and poor foot clearance- Left Distance walked: 115' + various clinic distances  Assistive device utilized: Environmental consultant - 2 wheeled and 20# weighted vest Level of assistance: CGA Comments: Used taller RW without theraband tied at bottom to assess if pt can demonstrate proper foot placement without external cue. Pt demonstrated wide BOS throughout but had reduced path deviations during swing phase bilaterally. Pt continues to rely heavily on BUEs on walker, particularly his RUE. Pt fatigued very quickly due to weight of vest.  Gait pattern: step through pattern, decreased stride length, decreased hip/knee flexion- Right, decreased ankle dorsiflexion- Right, ataxic, trunk flexed, and  narrow BOS Distance walked: >100' indoors  Assistive device utilized: Environmental consultant - 2 wheeled and 20# weight vest and 5# ankle weight on RLE Level of assistance: CGA Comments: Pt demonstrated improved foot clearance and placement of RLE w/use of ankle weight. Noted occasional scissoring of RLE which pt able to correct prior to initiating swing phase of LLE. No A/P sway noted w/use of weighted vest   NMR  In // bars for improved LE coordination, single leg stability and step clearance: -w/20# vest  on, Fwd 4" hurdle navigation w/BUE support progressing to single UE support, down and back x4. Min cues for step-to pattern and to slow down movement, as pt's coordination decreased w/increased speed of movement. Pt demonstrated difficulty w/RLE placement when leading w/RLE compared to leading w/LLE. On final rep, added 5# ankle weight to RLE and pt able to navigate hurdles more efficiently due to added proprioception and slowing down of movement.  -Lateral 4" hurdle navigation w/BUE support, 20# vest and 5# ankle weight, down and back x3. Min cues for wider step over hurdle w/leading leg to allow room for trailing leg to step. Pt performed well w/lateral navigation, no LOB or instability noted and good foot placement of RLE throughout.    PATIENT EDUCATION: Education details:  Continue HEP, next appointment time  Person educated: Patient Education method: Customer service manager Education comprehension: verbalized understanding and needs further education   HOME EXERCISE PROGRAM: Access Code: [email or web address] URL: [email or web address] Date: 11/26/2021 Prepared by: Elease Etienne  Exercises - Side Stepping with Counter Support  - 1 x daily - 7 x weekly - 1 sets - 4 reps - Single Leg Bridge  - 1 x daily - 5 x weekly - 2 sets - 8 reps - Sit to Stand with Resistance Around Legs  - 1 x daily - 5 x weekly - 2 sets - 10 reps - Standing March with Counter Support  - 1 x daily - 7 x weekly - 3 sets - 10 reps - Standing Hip Extension with Counter Support  - 1 x daily - 7 x weekly - 3 sets - 10 reps - Feet Together Balance at [employer] Open  - 1 x daily - 7 x weekly - 3 sets - 10 reps  GOALS: Goals reviewed with patient? Yes  SHORT TERM GOALS: Target date: 12/10/2021  Pt will be independent with initial HEP for improved strength, balance, transfers and gait.  Baseline: 12/10/2021 Established and updated, pt compliant. Goal status: MET  2.  Pt will improve 5 x STS to less  than or equal to 30s seconds w/BUE support and CGA to demonstrate improved functional strength and transfer efficiency.   Baseline: 35.63s w/BUE support and min A; 12/10/2021 45.44 sec w/ BUE support and 1 instance of minA to correct fwd LOB Goal status: NOT MET  3.  Pt will improve gait velocity to at least 0.9 ft/s w/LRAD for improved gait efficiency   Baseline: 0.66f/s w/RW; 12/10/2021 0.72 ft/sec w/ RW Goal status: ONGOING  4.  Berg to be performed at 30 day STG assessment and LTG written  Baseline: 21/56 Goal status: MET  5.  TUG to be performed at 30 day STG assessment and LTG written  Baseline:  Goal status: MET  6.  Pt will ambulate 250' w/LRAD and CGA for improved functional mobility and endurance  Baseline:  Goal status: MET  LONG TERM GOALS: Target date: 01/07/2022  Pt will improve 5 x STS to less than or equal  to 25 seconds w/BUE support mod I to demonstrate improved functional strength and transfer efficiency.   Baseline: 35.63s w/BUE support and min A Goal status: INITIAL  2.  Pt will improve gait velocity to at least 1.4 ft/s w/LRAD for improved gait efficiency and performance at limited community ambulator level   Baseline: 0.40f/s w/RW Goal status: INITIAL  3.  Pt will increase BERG balance score to >/=26/56 to demonstrate improved static balance. Baseline: 21/56 Goal status: INITIAL  4.  Pt will improve normal TUG to less than or equal to an average of 45s w/RW and S* for improved functional mobility and decreased fall risk.  Baseline: AVG of 3 trials: 59.28s w/RW and CGA Goal status: REVISED  5.  Pt will ambulate >400' w/RW and LRAD w/S* and without rest break for improved functional mobility and endurance  Baseline:  Goal status: INITIAL   ASSESSMENT:  CLINICAL IMPRESSION: Emphasis of skilled PT session on gait training w/weighted vest, BLE coordination and single leg stability. Pt tolerated session well and demonstrates significant reduction in  truncal ataxia w/use of weighted vest. Pt able to ambulate w/walker without need for external cues for foot placement for first time today. Pt will benefit from continued use of weights for ataxia management and improved UE/LE coordination. Continue POC.   OBJECTIVE IMPAIRMENTS Abnormal gait, decreased activity tolerance, decreased balance, decreased cognition, decreased coordination, decreased endurance, decreased mobility, difficulty walking, decreased safety awareness, and prosthetic dependency .   ACTIVITY LIMITATIONS carrying, lifting, bending, sitting, standing, squatting, stairs, transfers, bed mobility, reach over head, and locomotion level  PARTICIPATION LIMITATIONS: meal prep, cleaning, laundry, interpersonal relationship, driving, shopping, community activity, and yard work  PERSONAL FACTORS Age, Fitness, Past/current experiences, Time since onset of injury/illness/exacerbation, Transportation, and 1 comorbidity: L BKA are also affecting patient's functional outcome.   REHAB POTENTIAL: Fair due to history of noncompliance w/PT and decreased social support system   CLINICAL DECISION MAKING: Evolving/moderate complexity  EVALUATION COMPLEXITY: Moderate  PLAN: PT FREQUENCY: 2x/week  PT DURATION: 8 weeks  PLANNED INTERVENTIONS: Therapeutic exercises, Therapeutic activity, Neuromuscular re-education, Balance training, Gait training, Patient/Family education, Self Care, Stair training, Visual/preceptual remediation/compensation, Prosthetic training, DME instructions, Manual therapy, and Re-evaluation  PLAN FOR NEXT SESSION:  Experiment w/weighted vest/adding weight to pelvis w/gait. Modify/progress HEP prn, ladder drills, dot drills, SciFit intervals for endurance, SIT <>STANDS-push up not fwd, eccentric control of quads, trial ankle wts w/ standing march vs steps in // bars, standing wide stance w/ band around thighs - banded STS seemed to provide helpful proprioceptive input, standing  variable stance at countertop progressing to no UE support as able    JCruzita LedererPlaster, PT, DPT 12/24/2021, 11:26 AM

## 2021-12-25 LAB — LIPID PANEL
Chol/HDL Ratio: 4.6 ratio (ref 0.0–5.0)
Cholesterol, Total: 223 mg/dL — ABNORMAL HIGH (ref 100–199)
HDL: 49 mg/dL (ref >39)
LDL Chol Calc (NIH): 147 mg/dL — ABNORMAL HIGH (ref 0–99)
Triglycerides: 150 mg/dL — ABNORMAL HIGH (ref 0–149)
VLDL Cholesterol Cal: 27 mg/dL (ref 5–40)

## 2021-12-27 ENCOUNTER — Ambulatory Visit: Payer: Medicare Other | Admitting: Physical Therapy

## 2021-12-27 DIAGNOSIS — M6281 Muscle weakness (generalized): Secondary | ICD-10-CM

## 2021-12-27 DIAGNOSIS — R278 Other lack of coordination: Secondary | ICD-10-CM

## 2021-12-27 DIAGNOSIS — R2689 Other abnormalities of gait and mobility: Secondary | ICD-10-CM | POA: Diagnosis not present

## 2021-12-27 DIAGNOSIS — R260 Ataxic gait: Secondary | ICD-10-CM

## 2021-12-27 NOTE — Therapy (Signed)
OUTPATIENT PHYSICAL THERAPY TREATMENT NOTE   Patient Name: Kristopher Ruiz. MRN: [id number] DOB:09-10-1950, 71 y.o., male 9 Date: 12/27/2021  PCP: Alen Bleacher, MD REFERRING PROVIDER: Lenoria Chime, MD     END OF SESSION:   PT End of Session - 12/27/21 1402     Visit Number 12    Number of Visits 17   Plus eval   Date for PT Re-Evaluation 01/14/22    Authorization Type UHC Medicare/Medicaid of [place]    Progress Note Due on Visit 10    PT Start Time 1400    PT Stop Time 1440    PT Time Calculation (min) 40 min    Equipment Utilized During Treatment Gait belt;Other (comment)   20# weighted vest   Activity Tolerance Patient tolerated treatment well    Behavior During Therapy WFL for tasks assessed/performed                Past Medical History:  Diagnosis Date   Arthritis    Cataract    NS OU   Hypertension    Stroke [facility])    Past Surgical History:  Procedure Laterality Date   HIP FRACTURE SURGERY     LEG AMPUTATION BELOW KNEE     left leg   TOTAL HIP ARTHROPLASTY Left 10/31/2019   TOTAL HIP ARTHROPLASTY Left 10/31/2019   Procedure: LEFT TOTAL HIP ARTHROPLASTY, POSTERIOR;  Surgeon: Leandrew Koyanagi, MD;  Location: [place];  Service: Orthopedics;  Laterality: Left;   Patient Active Problem List   Diagnosis Date Noted   Anemia 07/06/2020   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   Closed displaced fracture of left femoral neck with delayed healing 09/28/2019   Establishing care with new doctor, encounter for 06/01/2019   TIA (transient ischemic attack) 09/13/2018   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 No new changes  PAIN:  Are you having pain? No   OBJECTIVE: (objective measures completed at initial evaluation unless otherwise dated)   TODAY'S TREATMENT: NMR  In // bars for improved LE coordination, single leg stability and step clearance: Alt forward step over 4" beam to colored dot target w/single UE support, x8 per side. Min-mod A for stabilization and assistance when regressing LLE, as pt demonstrated crouched posture on R side when stepping w/L foot. Progressed to adding 20# weighted vest, x8 per side, w/min -mod A for continued assistance maintaining midline orientation and stepping w/LLE.  Lat step over 4" beam w/weighted vest, x5 per side due to difficulty stabilizing on stance leg due to bilateral hip abductor weakness. Pt required BUE support on rail and min A to perform.  Standing on airex w/no UE support and weighted vest- progressed to weight shifts    NMR-Gait Training Gait pattern:  asymmetric step length, step through pattern, ataxic, decreased trunk rotation, trunk flexed, poor foot clearance- Right, and poor foot clearance- Left Distance walked:  115' Assistive device utilized: Environmental consultant - 2 wheeled and red theraband tied around distal thighs Level of assistance: CGA Comments: Trialed use of theraband tied around distal quads to provide tactile cues for hip abduction bilaterally. Min cues for reduced step length of LLE, as it caused pt to push RW away from himself. Noted reduced scissoring of RLE w/band and improved foot placement compared to using ankle weights.    PATIENT EDUCATION: Education details:  Continue HEP, next appointment time  Person educated: Patient Education method: Holiday representative Education comprehension: verbalized understanding and needs further education   HOME EXERCISE PROGRAM: Access Code: [email or web address] URL: [email or web address] Date: 11/26/2021 Prepared by: Elease Etienne  Exercises - Side Stepping with Counter Support  - 1 x daily - 7 x weekly - 1 sets - 4 reps - Single Leg Bridge  - 1 x daily - 5 x weekly - 2 sets - 8 reps - Sit to Stand with Resistance Around Legs  - 1 x daily - 5 x weekly - 2 sets - 10 reps - Standing March with Counter Support  - 1 x daily - 7 x weekly - 3 sets - 10 reps - Standing Hip Extension with Counter Support  - 1 x daily - 7 x weekly - 3 sets - 10 reps - Feet Together Balance at [employer] Open  - 1 x daily - 7 x weekly - 3 sets - 10 reps  GOALS: Goals reviewed with patient? Yes  SHORT TERM GOALS: Target date: 12/10/2021  Pt will be independent with initial HEP for improved strength, balance, transfers and gait.  Baseline: 12/10/2021 Established and updated, pt compliant. Goal status: MET  2.  Pt will improve 5 x STS to less than or equal to 30s seconds w/BUE support and CGA to demonstrate improved functional strength and transfer efficiency.   Baseline: 35.63s w/BUE support and min A; 12/10/2021 45.44 sec w/ BUE support and 1 instance of minA to correct fwd LOB Goal status: NOT MET  3.  Pt will improve gait velocity to at least 0.9 ft/s w/LRAD for improved gait efficiency   Baseline: 0.8f/s w/RW; 12/10/2021 0.72 ft/sec w/ RW Goal status: ONGOING  4.  Berg to be performed at 30 day STG assessment and LTG written  Baseline: 21/56 Goal status: MET  5.  TUG to be performed at 30 day STG assessment and LTG written  Baseline:  Goal status: MET  6.  Pt will ambulate 250' w/LRAD and CGA for improved functional mobility and endurance  Baseline:  Goal status: MET  LONG TERM GOALS: Target date: 01/07/2022  Pt will improve 5 x STS to less than or equal to 25 seconds w/BUE support mod I  to demonstrate improved functional strength and transfer efficiency.   Baseline: 35.63s w/BUE support and min A Goal status: INITIAL  2.  Pt will improve gait velocity to at least 1.4 ft/s w/LRAD for improved gait efficiency and performance at limited community ambulator level   Baseline: 0.618fs w/RW Goal status: INITIAL  3.  Pt will increase BERG balance score to >/=26/56 to demonstrate improved static balance. Baseline: 21/56 Goal status: INITIAL  4.  Pt will improve normal TUG to less than or equal to an average of 45s w/RW and S* for improved functional mobility and decreased fall risk.  Baseline: AVG of 3 trials: 59.28s w/RW and CGA Goal status: REVISED  5.  Pt will ambulate >400' w/RW and LRAD w/S* and without rest break for improved  functional mobility and endurance  Baseline:  Goal status: INITIAL   ASSESSMENT:  CLINICAL IMPRESSION: Emphasis of skilled PT session on LE coordination, hip abductor strength and single leg stability. Pt continues to be limited by bilateral hip abduction weakness and truncal and limb ataxia. However, pt has demonstrated a good response to weighted vest and tactile cues for truncal stability and coordination of UE/LE. Continue POC.   OBJECTIVE IMPAIRMENTS Abnormal gait, decreased activity tolerance, decreased balance, decreased cognition, decreased coordination, decreased endurance, decreased mobility, difficulty walking, decreased safety awareness, and prosthetic dependency .   ACTIVITY LIMITATIONS carrying, lifting, bending, sitting, standing, squatting, stairs, transfers, bed mobility, reach over head, and locomotion level  PARTICIPATION LIMITATIONS: meal prep, cleaning, laundry, interpersonal relationship, driving, shopping, community activity, and yard work  PERSONAL FACTORS Age, Fitness, Past/current experiences, Time since onset of injury/illness/exacerbation, Transportation, and 1 comorbidity: L BKA are also affecting patient's  functional outcome.   REHAB POTENTIAL: Fair due to history of noncompliance w/PT and decreased social support system   CLINICAL DECISION MAKING: Evolving/moderate complexity  EVALUATION COMPLEXITY: Moderate  PLAN: PT FREQUENCY: 2x/week  PT DURATION: 8 weeks  PLANNED INTERVENTIONS: Therapeutic exercises, Therapeutic activity, Neuromuscular re-education, Balance training, Gait training, Patient/Family education, Self Care, Stair training, Visual/preceptual remediation/compensation, Prosthetic training, DME instructions, Manual therapy, and Re-evaluation  PLAN FOR NEXT SESSION:  Experiment w/weighted vest/adding weight to pelvis w/gait. Modify/progress HEP prn, ladder drills, dot drills, SciFit intervals for endurance, SIT <>STANDS-push up not fwd, eccentric control of quads, trial ankle wts w/ standing march vs steps in // bars, standing wide stance w/ band around thighs - banded STS seemed to provide helpful proprioceptive input, standing variable stance at countertop progressing to no UE support as able    Cruzita Lederer Thersea [place]fred[place], PT, DPT 12/27/2021, 2:41 PM

## 2021-12-31 ENCOUNTER — Ambulatory Visit: Payer: Medicare Other | Attending: Orthopedic Surgery | Admitting: Physical Therapy

## 2021-12-31 DIAGNOSIS — R260 Ataxic gait: Secondary | ICD-10-CM | POA: Diagnosis present

## 2021-12-31 DIAGNOSIS — R278 Other lack of coordination: Secondary | ICD-10-CM

## 2021-12-31 DIAGNOSIS — R2681 Unsteadiness on feet: Secondary | ICD-10-CM

## 2021-12-31 DIAGNOSIS — S88112A Complete traumatic amputation at level between knee and ankle, left lower leg, initial encounter: Secondary | ICD-10-CM | POA: Diagnosis present

## 2021-12-31 DIAGNOSIS — M6281 Muscle weakness (generalized): Secondary | ICD-10-CM | POA: Insufficient documentation

## 2021-12-31 DIAGNOSIS — R2689 Other abnormalities of gait and mobility: Secondary | ICD-10-CM | POA: Diagnosis present

## 2021-12-31 DIAGNOSIS — R293 Abnormal posture: Secondary | ICD-10-CM | POA: Insufficient documentation

## 2021-12-31 NOTE — Therapy (Signed)
OUTPATIENT PHYSICAL THERAPY TREATMENT NOTE   Patient Name: Kristopher Ruiz. MRN: [id number] DOB:[birth year], 71 y.o., male 6 Date: 12/31/2021  PCP: Alen Bleacher, MD REFERRING PROVIDER: Lenoria Chime, MD     END OF SESSION:   PT End of Session - 12/31/21 1408     Visit Number 13    Number of Visits 17   Plus eval   Date for PT Re-Evaluation 01/14/22    Authorization Type UHC Medicare/Medicaid of [place]    Progress Note Due on Visit 10    PT Start Time 1405    PT Stop Time 1444    PT Time Calculation (min) 39 min    Equipment Utilized During Treatment Gait belt    Activity Tolerance Patient tolerated treatment well    Behavior During Therapy WFL for tasks assessed/performed                 Past Medical History:  Diagnosis Date   Arthritis    Cataract    NS OU   Hypertension    Stroke [facility])    Past Surgical History:  Procedure Laterality Date   HIP FRACTURE SURGERY     LEG AMPUTATION BELOW KNEE     left leg   TOTAL HIP ARTHROPLASTY Left 10/31/2019   TOTAL HIP ARTHROPLASTY Left 10/31/2019   Procedure: LEFT TOTAL HIP ARTHROPLASTY, POSTERIOR;  Surgeon: Leandrew Koyanagi, MD;  Location: [place];  Service: Orthopedics;  Laterality: Left;   Patient Active Problem List   Diagnosis Date Noted   Anemia 07/06/2020   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   Closed displaced fracture of left femoral neck with delayed healing 09/28/2019   Establishing care with new doctor, encounter for 06/01/2019   TIA (transient ischemic attack) 09/13/2018   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 No new changes to report. Getting bored w/HEP  PAIN:  Are you having pain? No   OBJECTIVE: (objective measures completed at initial evaluation unless otherwise dated)   TODAY'S TREATMENT:  NMR-Gait Training Gait pattern:  asymmetric step length, step through pattern, ataxic, decreased trunk rotation, trunk flexed, poor foot clearance- Right, and poor foot clearance- Left Distance walked: 230' Assistive device utilized: Environmental consultant - 2 wheeled and red theraband tied around distal thighs Level of assistance: CGA Comments: Used theraband tied around distal quads to provide tactile cues for hip abduction bilaterally. Min cues to keep RW close to body, as pt tends to push walker away and rely more on BUEs. Noted reduced scissoring of RLE w/band but pt required min cues to maintain abduction of RLE w/fatigue.   NMR Updated HEP (see bolded below) to add some variety to exercises. Pt reported he has not performed standing marches much, as he needs his roommate, Iona Beard, to assist him in due to being unstable w/RW. Informed pt to perform at his dresser instead, as it is more stable and  this is where he performs his side-stepping exercise. Pt verbalized understanding.   In // bars for improved hip abduction strength, lateral weight shifting and postural control: -Lateral monster walks w/BUE support on rail and red theraband tied around distal quads, down and back x2. Min verbal cues for maintaining abduction of BLEs throughout and increased step clearance of RLE. Pt able to eccentrically control step w/RLE when moving to L side by increasing hip/knee flexion. Added to HEP to replace side-stepping exercise and informed pt to perform at dresser.  -Standing on rockerboard in L/R direction without UE  support and using mirror for visual biofeedback on body position, practiced lateral weight shifts using ankle strategy. Min-mod A for rare anterior LOB and frequent over-correction of shift using trunk. Encouraged pt to flex his knees to facilitate shift ("as if you are marching") in order to reduce compensation w/head and trunk. Pt able to perform for ~2 minutes prior to regressing to using trunk/head to facilitate shift.    PATIENT EDUCATION: Education details:  updates to HEP, next appointment time  Person educated: Patient Education method: Customer service manager Education comprehension: verbalized understanding and needs further education   HOME EXERCISE PROGRAM: Access Code: [email or web address] URL: [email or web address] Date: 12/31/2021 Prepared by: Mickie Bail Jaylaa Gallion  Exercises - Single Leg Bridge  - 1 x daily - 5 x weekly - 2 sets - 8 reps - Sit to Stand with Resistance Around Legs  - 1 x daily - 5 x weekly - 2 sets - 10 reps - Standing March with Counter Support  - 1 x daily - 7 x weekly - 3 sets - 10 reps - Standing Hip Extension with Counter Support  - 1 x daily - 7 x weekly - 3 sets - 10 reps - Side Stepping with Resistance at Thighs and Counter Support  - 1 x daily - 7 x weekly - 3 sets - 10 reps  GOALS: Goals reviewed with patient? Yes  SHORT TERM GOALS: Target date: 12/10/2021  Pt will be independent with initial HEP for improved strength, balance, transfers and gait.  Baseline: 12/10/2021 Established and updated, pt compliant. Goal status: MET  2.  Pt will improve 5 x STS to less than or equal to 30s seconds w/BUE support and CGA to demonstrate improved functional strength and transfer efficiency.   Baseline: 35.63s w/BUE support and min A; 12/10/2021 45.44 sec w/ BUE support and 1 instance of minA to correct fwd LOB Goal status: NOT MET  3.  Pt will improve gait velocity to at least 0.9 ft/s w/LRAD for improved gait efficiency   Baseline: 0.59f/s w/RW;  12/10/2021 0.72 ft/sec w/ RW Goal status: ONGOING  4.  Berg to be performed at 30 day STG assessment and LTG written  Baseline: 21/56 Goal status: MET  5.  TUG to be performed at 30 day STG assessment and LTG written  Baseline:  Goal status: MET  6.  Pt will ambulate 250' w/LRAD and CGA for improved functional mobility and endurance  Baseline:  Goal status: MET  LONG TERM GOALS: Target date: 01/07/2022  Pt will improve 5 x STS to less than or equal to 25 seconds w/BUE support mod I to demonstrate improved functional strength and transfer efficiency.   Baseline: 35.63s w/BUE support and min A Goal status: INITIAL  2.  Pt will improve gait velocity to at least 1.4 ft/s w/LRAD for improved gait efficiency and performance at limited community ambulator level   Baseline: 0.626fs w/RW Goal status: INITIAL  3.  Pt will increase BERG balance score to >/=26/56 to demonstrate improved static balance. Baseline: 21/56 Goal status: INITIAL  4.  Pt will improve normal TUG to less than or equal to an average of 45s w/RW and S* for improved functional mobility and decreased fall risk.  Baseline: AVG of 3 trials: 59.28s w/RW and CGA Goal status: REVISED  5.  Pt will ambulate >400' w/RW and LRAD w/S* and without rest break for improved functional mobility and endurance  Baseline:  Goal status: INITIAL   ASSESSMENT:  CLINICAL IMPRESSION: Emphasis of skilled PT session on BLE strength, lateral weight shifting strategies and postural control. Pt continues to rely heavily on BUEs for ambulation despite cues to maintain close distance to RW and maintain upright posture. Pt also limited w/inability to shift weight laterally without significant truncal lean, resulting in lateral LOB. Updated pt's HEP as he states his old one is "boring", but limited to which exercises can be added due to pt not having support at home. Continue POC.   OBJECTIVE IMPAIRMENTS Abnormal gait, decreased activity  tolerance, decreased balance, decreased cognition, decreased coordination, decreased endurance, decreased mobility, difficulty walking, decreased safety awareness, and prosthetic dependency .   ACTIVITY LIMITATIONS carrying, lifting, bending, sitting, standing, squatting, stairs, transfers, bed mobility, reach over head, and locomotion level  PARTICIPATION LIMITATIONS: meal prep, cleaning, laundry, interpersonal relationship, driving, shopping, community activity, and yard work  PERSONAL FACTORS Age, Fitness, Past/current experiences, Time since onset of injury/illness/exacerbation, Transportation, and 1 comorbidity: L BKA are also affecting patient's functional outcome.   REHAB POTENTIAL: Fair due to history of noncompliance w/PT and decreased social support system   CLINICAL DECISION MAKING: Evolving/moderate complexity  EVALUATION COMPLEXITY: Moderate  PLAN: PT FREQUENCY: 2x/week  PT DURATION: 8 weeks  PLANNED INTERVENTIONS: Therapeutic exercises, Therapeutic activity, Neuromuscular re-education, Balance training, Gait training, Patient/Family education, Self Care, Stair training, Visual/preceptual remediation/compensation, Prosthetic training, DME instructions, Manual therapy, and Re-evaluation  PLAN FOR NEXT SESSION:  Experiment w/weighted vest/adding weight to pelvis w/gait. Modify/progress HEP prn, ladder drills, dot drills, SciFit intervals for endurance, SIT <>STANDS-push up not fwd, eccentric control of quads, trial ankle wts w/ standing march vs steps in // bars, standing wide stance w/ band around thighs - banded STS seemed to provide helpful proprioceptive input, standing variable stance at countertop progressing to no UE support as able    Cruzita Lederer Areanna Gengler, PT, DPT 12/31/2021, 2:44 PM

## 2022-01-03 ENCOUNTER — Telehealth: Payer: Self-pay | Admitting: Pharmacist

## 2022-01-03 ENCOUNTER — Ambulatory Visit: Payer: Medicare Other | Admitting: Physical Therapy

## 2022-01-03 DIAGNOSIS — R293 Abnormal posture: Secondary | ICD-10-CM

## 2022-01-03 DIAGNOSIS — M6281 Muscle weakness (generalized): Secondary | ICD-10-CM

## 2022-01-03 DIAGNOSIS — R278 Other lack of coordination: Secondary | ICD-10-CM

## 2022-01-03 NOTE — Telephone Encounter (Signed)
Patient contacted for follow/up of tobacco intake reduction / cessation attempt.   Since last contact patient reports continued smoking after meals and when upset.  Several cigarettes per day.  He reports that P/T is going well.   Medications currently being used; None BUT has supply of   Nicotine patch - and Nicotine Gum   Most common triggers to use tobacco include; habit (after meals) and when upset   Motivation to quit: Breathing  Total time with patient call and documentation of interaction: 11 minutes. Follow-up planned for visit on 10/10 - asked to bring all medications including the nicotine patch and gum supply he has available.   He verbalized understanding and plans to come on Tuesday 10/10 to his 9:00 AM visit.

## 2022-01-03 NOTE — Telephone Encounter (Signed)
-----   Message from Leavy Cella, [place] sent at 11/26/2021  4:00 PM EDT ----- Regarding: QUIT smoking - ?   Had hip fx was in rehab 10/2021

## 2022-01-03 NOTE — Telephone Encounter (Signed)
Noted and agree. 

## 2022-01-03 NOTE — Therapy (Signed)
OUTPATIENT PHYSICAL THERAPY TREATMENT NOTE   Patient Name: Kristopher Ruiz. MRN: [id number] DOB:[birth year], 71 y.o., male 8 Date: 01/03/2022  PCP: Alen Bleacher, MD REFERRING PROVIDER: Lenoria Chime, MD     END OF SESSION:   PT End of Session - 01/03/22 1405     Visit Number 14    Number of Visits 17   Plus eval   Date for PT Re-Evaluation 01/14/22    Authorization Type [employer]HC Medicare/Medicaid of [place]    Progress Note Due on Visit 10    PT Start Time 1402    PT Stop Time 1443    PT Time Calculation (min) 41 min    Equipment Utilized During Treatment Gait belt    Activity Tolerance Patient tolerated treatment well    Behavior During Therapy WFL for tasks assessed/performed                  Past Medical History:  Diagnosis Date   Arthritis    Cataract    NS OU   Hypertension    Stroke [facility])    Past Surgical History:  Procedure Laterality Date   HIP FRACTURE SURGERY     LEG AMPUTATION BELOW KNEE     left leg   TOTAL HIP ARTHROPLASTY Left 10/31/2019   TOTAL HIP ARTHROPLASTY Left 10/31/2019   Procedure: LEFT TOTAL HIP ARTHROPLASTY, POSTERIOR;  Surgeon: Leandrew Koyanagi, MD;  Location: [place];  Service: Orthopedics;  Laterality: Left;   Patient Active Problem List   Diagnosis Date Noted   Anemia 07/06/2020   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   Closed displaced fracture of left femoral neck with delayed healing 09/28/2019   Establishing care with new doctor, encounter for 06/01/2019   TIA (transient ischemic attack) 09/13/2018   Cerebellar stroke ([place]) 09/06/2018   Hypoglycemia due to insulin 09/06/2018   Tobacco abuse 09/06/2018   Hyperlipidemia 09/06/2018   Essential hypertension 09/06/2018   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 Has not tried the new additions to his HEP yet. No falls   PAIN:  Are you having pain? No   OBJECTIVE: (objective measures completed at initial evaluation unless otherwise dated)   TODAY'S TREATMENT: NMR The following were performed w/pt sitting on small blue theraball in front of mirror (visual biofeedback) for improved core stability, UE/LE coordination and lateral weight shifting. Therapist stationed behind pt (seated on mat) and providing CGA-mod A throughout: Alt shoulder flexion, x8 per side. Noted increased LOB to R side > L side  Alt seated marches, 2x8 per side. Pt had significant difficulty stabilizing due to tendency to shift weight away from stance leg, requiring mod A to stabilize. Provided mod multimodal cues for shifting weight towards stance leg to improve stability and pt able to demonstrate well, but continued to have poor trunk control when shifting to L due to R core weakness.  Progressed to seated bird dogs, 3x5 per side w/min -mod A for stability. Pt had significant difficulty maintaining alternating sequence, frequently regressing to ipsilateral UE/LE flexion. Min multimodal cues for proper weight shift  to stance leg side. Pt required frequent rest breaks due to fatigue and frustration, provided rest and encouragement as appropriate.  Diona Foley tosses w/2 therapists (one guarding and one throwing ball) to challenge truncal stability when reaching out of BOS. Pt able to reach to R side well but continues to be challenged with reaching to L side. CGA-min A throughout for LOB correction on L side.   Gait Training  Gait pattern: step through pattern, decreased stride length, decreased hip/knee flexion- Right, decreased hip/knee flexion- Left, ataxic, lateral hip instability,  and decreased trunk rotation Distance walked: 115' Assistive device utilized: Environmental consultant - 2 wheeled Level of assistance: SBA Comments: Pt continues to rely heavily on BUEs but noted improved step clearance and placement of RLE without use of weights or tactile cues.     PATIENT EDUCATION: Education details:  Continue HEP, next appointment time  Person educated: Patient Education method: Customer service manager Education comprehension: verbalized understanding and needs further education   HOME EXERCISE PROGRAM: Access Code: [email or web address] URL: [email or web address] Date: 12/31/2021 Prepared by: Mickie Bail Olis Viverette  Exercises - Single Leg Bridge  - 1 x daily - 5 x weekly - 2 sets - 8 reps - Sit to Stand with Resistance Around Legs  - 1 x daily - 5 x weekly - 2 sets - 10 reps - Standing March with Counter Support  - 1 x daily - 7 x weekly - 3 sets - 10 reps - Standing Hip Extension with Counter Support  - 1 x daily - 7 x weekly - 3 sets - 10 reps - Side Stepping with Resistance at Thighs and Counter Support  - 1 x daily - 7 x weekly - 3 sets - 10 reps  GOALS: Goals reviewed with patient? Yes  SHORT TERM GOALS: Target date: 12/10/2021  Pt will be independent with initial HEP for improved strength, balance, transfers and gait.  Baseline: 12/10/2021 Established and updated, pt compliant. Goal status: MET  2.  Pt will improve 5 x STS to less than or equal to 30s seconds w/BUE support and CGA to demonstrate improved functional strength and transfer efficiency.   Baseline: 35.63s w/BUE support and min A; 12/10/2021 45.44 sec w/ BUE support and 1 instance of minA to correct fwd LOB Goal status: NOT MET  3.  Pt will improve gait velocity to at least 0.9 ft/s w/LRAD for improved gait efficiency   Baseline: 0.52f/s w/RW; 12/10/2021 0.72 ft/sec w/ RW Goal status: ONGOING  4.  Berg to be performed at 30 day STG assessment and LTG written  Baseline: 21/56 Goal status: MET  5.   TUG to be performed at 30 day STG assessment and LTG written  Baseline:  Goal status: MET  6.  Pt will ambulate 250' w/LRAD and CGA for improved functional mobility and endurance  Baseline:  Goal status: MET  LONG TERM GOALS: Target date: 01/07/2022  Pt will improve 5 x STS to less than or equal to 25 seconds w/BUE support mod I to demonstrate improved functional strength and transfer efficiency.   Baseline: 35.63s w/BUE support and min A Goal status: INITIAL  2.  Pt will improve gait velocity to at least 1.4 ft/s w/LRAD for improved gait efficiency and performance at limited community ambulator level   Baseline: 0.625fs w/RW Goal status: INITIAL  3.  Pt will increase BERG balance score to >/=26/56 to demonstrate improved static balance. Baseline: 21/56 Goal status: INITIAL  4.  Pt will improve normal TUG to less than or equal  to an average of 45s w/RW and S* for improved functional mobility and decreased fall risk.  Baseline: AVG of 3 trials: 59.28s w/RW and CGA Goal status: REVISED  5.  Pt will ambulate >400' w/RW and LRAD w/S* and without rest break for improved functional mobility and endurance  Baseline:  Goal status: INITIAL   ASSESSMENT:  CLINICAL IMPRESSION: Emphasis of skilled PT session on core stability, lateral weight shifting and UE/LE coordination. Pt continues to be challenged by coordinating lateral weight shift w/gait and trunk dissociation. Pt responds well to use of visual biofeedback for proper body position but continues to be limited by R hemibody weakness and truncal/limb ataxia. Pt in agreement to recert next week to continue to work on stability w/gait and dynamic tasks and return to PLOF. Continue POC.   OBJECTIVE IMPAIRMENTS Abnormal gait, decreased activity tolerance, decreased balance, decreased cognition, decreased coordination, decreased endurance, decreased mobility, difficulty walking, decreased safety awareness, and prosthetic dependency .    ACTIVITY LIMITATIONS carrying, lifting, bending, sitting, standing, squatting, stairs, transfers, bed mobility, reach over head, and locomotion level  PARTICIPATION LIMITATIONS: meal prep, cleaning, laundry, interpersonal relationship, driving, shopping, community activity, and yard work  PERSONAL FACTORS Age, Fitness, Past/current experiences, Time since onset of injury/illness/exacerbation, Transportation, and 1 comorbidity: L BKA are also affecting patient's functional outcome.   REHAB POTENTIAL: Fair due to history of noncompliance w/PT and decreased social support system   CLINICAL DECISION MAKING: Evolving/moderate complexity  EVALUATION COMPLEXITY: Moderate  PLAN: PT FREQUENCY: 2x/week  PT DURATION: 8 weeks  PLANNED INTERVENTIONS: Therapeutic exercises, Therapeutic activity, Neuromuscular re-education, Balance training, Gait training, Patient/Family education, Self Care, Stair training, Visual/preceptual remediation/compensation, Prosthetic training, DME instructions, Manual therapy, and Re-evaluation  PLAN FOR NEXT SESSION:  Check goals and recert for 2x/week for 6-8 weeks.  Experiment w/weighted vest/adding weight to pelvis w/gait. Modify/progress HEP prn, ladder drills, dot drills, SciFit intervals for endurance, SIT <>STANDS-push up not fwd, eccentric control of quads, trial ankle wts w/ standing march vs steps in // bars, standing wide stance w/ band around thighs - banded STS seemed to provide helpful proprioceptive input, standing variable stance at countertop progressing to no UE support as able    Cruzita Lederer Leonie Amacher, PT, DPT 01/03/2022, 2:44 PM

## 2022-01-07 ENCOUNTER — Encounter: Payer: Self-pay | Admitting: Pharmacist

## 2022-01-07 ENCOUNTER — Ambulatory Visit (INDEPENDENT_AMBULATORY_CARE_PROVIDER_SITE_OTHER): Payer: Medicare Other | Admitting: Pharmacist

## 2022-01-07 ENCOUNTER — Encounter: Payer: Self-pay | Admitting: Physical Therapy

## 2022-01-07 ENCOUNTER — Ambulatory Visit: Payer: Medicare Other | Admitting: Physical Therapy

## 2022-01-07 DIAGNOSIS — R260 Ataxic gait: Secondary | ICD-10-CM

## 2022-01-07 DIAGNOSIS — M6281 Muscle weakness (generalized): Secondary | ICD-10-CM

## 2022-01-07 DIAGNOSIS — E785 Hyperlipidemia, unspecified: Secondary | ICD-10-CM

## 2022-01-07 DIAGNOSIS — R278 Other lack of coordination: Secondary | ICD-10-CM | POA: Diagnosis not present

## 2022-01-07 DIAGNOSIS — Z720 Tobacco use: Secondary | ICD-10-CM | POA: Diagnosis not present

## 2022-01-07 DIAGNOSIS — R2681 Unsteadiness on feet: Secondary | ICD-10-CM

## 2022-01-07 DIAGNOSIS — I10 Essential (primary) hypertension: Secondary | ICD-10-CM

## 2022-01-07 DIAGNOSIS — R2689 Other abnormalities of gait and mobility: Secondary | ICD-10-CM

## 2022-01-07 DIAGNOSIS — S88112A Complete traumatic amputation at level between knee and ankle, left lower leg, initial encounter: Secondary | ICD-10-CM

## 2022-01-07 DIAGNOSIS — R293 Abnormal posture: Secondary | ICD-10-CM

## 2022-01-07 MED ORDER — VARENICLINE TARTRATE 0.5 MG PO TABS: 0.5000 mg | ORAL_TABLET | Freq: Two times a day (BID) | ORAL | 5 refills | Status: DC

## 2022-01-07 MED ORDER — ATORVASTATIN CALCIUM 80 MG PO TABS: 80.0000 mg | ORAL_TABLET | Freq: Every day | ORAL | 3 refills | Status: DC

## 2022-01-07 NOTE — Patient Instructions (Signed)
Nice to see you today!  Goal is to quit smoking in the next 2 weeks.   START  Varenicline 0.5mg  once daily with food for the first week THEN twice daily with food.   Continue to use the nicotine gum as needed for cravings - remember to chew and park.  Atorvastatin 80mg  once daily.   Schedule to come back to see Dr. Adah Salvage in the next 3-4 weeks.

## 2022-01-07 NOTE — Assessment & Plan Note (Signed)
Hypertension -Longstanding, chronic hypertension currently controlled. Continue amlodipine 10 mg daily and metoprolol succinate 25 mg daily. Consider switch from Beta-Blocker to ARB for possible sexual dysfunction related to Beta-blocker therapy.

## 2022-01-07 NOTE — Progress Notes (Signed)
   S:   Chief Complaint  Patient presents with   Medication Management    Tobacco cessation and hypertension follow-up   Kristopher Ruiz. is a 71 y.o. male who presents for evaluation/assistance with tobacco dependence and medication management of hypertension and hyperlipidemia.  PMH is significant for hypertension, stroke, tobacco abuse, hyperlipidemia, BKA.  Patient was referred and last seen by Primary Care Provider, Dr. Adah Salvage, on 12/24/2021.   At last visit, patient was unsure of what medications he should be taking and was contemplating tobacco cessation.   Number of cigarettes/day approx. 8.  Estimated nicotine content per cigarette: 1 mg.  Estimated nicotine intake per day 8 mg.   Denies waking to smoke.    Most recent quit attempt June 2023 Longest time ever been tobacco free: weeks.  Medications used in past cessation efforts include: NRT  Most common triggers to use tobacco include; after eating    Patient wanted to discuss symptoms of difficutlt maintaining erection.   We discussed first step would be to quit smoking and then follow-up with Dr. Adah Salvage for additional consideration, evaluation and treatment.   O: Clinical ASCVD: Yes   Review of Systems  All other systems reviewed and are negative.  Physical Exam Constitutional:      Appearance: Normal appearance. He is normal weight.  Pulmonary:     Effort: Pulmonary effort is normal.  Neurological:     Mental Status: He is alert.  Psychiatric:        Mood and Affect: Mood normal.        Behavior: Behavior normal.        Thought Content: Thought content normal.        Judgment: Judgment normal.     A/P: Tobacco use disorder with mild-moderate nicotine long-term dependence in a patient who is good candidate for success because of previous success with quit attempts and motivation to quit. Patient rates motivation and confidence in quitting as high. -Initiated nicotine replacement tx with nicotine gum as  needed for cravings. Patient counseled on purpose, proper use, and potential adverse effects, including GI upset.   -Initiated varenicline 0.5 mg by mouth once daily with food x7 days, then 0.5 mg by mouth twice daily with food thereafter. Patient counseled on purpose, proper use, and potential adverse effects, including GI upset.Marland Kitchen   Hypertension -Longstanding, chronic hypertension currently controlled. Continue amlodipine 10 mg daily and metoprolol succinate 25 mg daily. Consider switch from Beta-Blocker to ARB for possible sexual dysfunction related to Beta-blocker therapy.   Hyperlipidemia -Restart atorvastatin 80 mg daily given ASCVD.     Written patient instructions provided. Patient verbalized understanding of treatment plan.  Total time in face to face counseling 42 minutes.    Follow-up:  Pharmacist 2 weeks via telephone. PCP clinic visit 3-4 weeks.  Patient seen with Geraldo Docker, PharmD Candidate and Garrel Ridgel, PharmD Candidate.

## 2022-01-07 NOTE — Therapy (Signed)
OUTPATIENT PHYSICAL THERAPY TREATMENT NOTE/Re-Cert   Patient Name: Kristopher Ruiz. MRN: [id number] DOB:[birth year], 71 y.o., male 1 Date: 01/07/2022  PCP: Alen Bleacher, MD REFERRING PROVIDER: Lenoria Chime, MD     END OF SESSION:   01/07/22 1404  PT Visits / Re-Eval  Visit Number 15  Number of Visits 76 ([phone])  Date for PT Re-Evaluation 38/46/65 (re-certed on 99/35/7017)  Authorization  Authorization Type UHC Medicare/Medicaid of   Progress Note Due on Visit 20  PT Time Calculation  PT Start Time 1401  PT Stop Time 1445  PT Time Calculation (min) 44 min  PT - End of Session  Equipment Utilized During Treatment Gait belt  Activity Tolerance Patient tolerated treatment well  Behavior During Therapy WFL for tasks assessed/performed   Past Medical History:  Diagnosis Date   Arthritis    Cataract    NS OU   Hypertension    Stroke [facility])    Past Surgical History:  Procedure Laterality Date   HIP FRACTURE SURGERY     LEG AMPUTATION BELOW KNEE     left leg   TOTAL HIP ARTHROPLASTY Left 10/31/2019   TOTAL HIP ARTHROPLASTY Left 10/31/2019   Procedure: LEFT TOTAL HIP ARTHROPLASTY, POSTERIOR;  Surgeon: Leandrew Koyanagi, MD;  Location: [place];  Service: Orthopedics;  Laterality: Left;   Patient Active Problem List   Diagnosis Date Noted   Anemia 07/06/2020   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   Closed displaced fracture of left femoral neck with delayed healing 09/28/2019   Establishing care with new doctor, encounter for 06/01/2019   TIA (transient ischemic attack) 09/13/2018   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   Hypoglycemia due to insulin 09/06/2018   Tobacco abuse 09/06/2018   Hyperlipidemia 09/06/2018   Essential hypertension 09/06/2018   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[place]) 04/16/2017    REFERRING DIAG: I63.9 (ICD-10-CM) - Cerebellar stroke (HCC)   THERAPY DIAG:  Muscle weakness (generalized)  Other lack of  coordination  Abnormal posture  Ataxic gait  Unsteadiness on feet  Other abnormalities of gait and mobility  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HCC)  Rationale for Evaluation and Treatment Rehabilitation  PERTINENT HISTORY: Hx of L BKA, CVA, HTN, 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 No falls.   PAIN:  Are you having pain? No   OBJECTIVE: (objective measures completed at initial evaluation unless otherwise dated)   TODAY'S TREATMENT: Assessed LTGs: -Verbally reviewed addition to HEP. -5xSTS BUE support and CGA 34.31 sec -10MWT CGA-SBA 45.22 sec = 0.22 m/sec OR 0.73 ft/sec -Pt ambulates 127' w/ CGA-SBA using RW prior to signs of fatigue and pt initiating rest break, second bout of ambulation w/ RW predominantly SBA/close supervision level 244', pt requires 2 standing rest breaks of <10 seconds. -TUG w/ RW and CGA:  56.75 sec -Initiated BERG assessment:  [facility] PT Assessment - 01/07/22 1441       Berg Balance Test   Sit to Stand Able to stand  independently using hands    Standing Unsupported Able to stand 30 seconds unsupported   Completed 1 min and 40 sec before falling backwards.   Sitting with Back Unsupported but Feet Supported on Floor or Stool Able to sit safely and securely 2 minutes    Stand  to Sit Controls descent by using hands            PATIENT EDUCATION: Education details:  Continue HEP, next appointment time. Person educated: Patient Education method: Customer service manager Education comprehension: verbalized understanding and needs further education   HOME EXERCISE PROGRAM: Access Code: [email or web address] URL: [email or web address] Date: 12/31/2021 Prepared by: Mickie Bail Plaster  Exercises - Single Leg Bridge  - 1 x daily - 5 x weekly - 2 sets - 8 reps - Sit to  Stand with Resistance Around Legs  - 1 x daily - 5 x weekly - 2 sets - 10 reps - Standing March with Counter Support  - 1 x daily - 7 x weekly - 3 sets - 10 reps - Standing Hip Extension with Counter Support  - 1 x daily - 7 x weekly - 3 sets - 10 reps - Side Stepping with Resistance at Thighs and Counter Support  - 1 x daily - 7 x weekly - 3 sets - 10 reps  OLD GOALS: Goals reviewed with patient? Yes  SHORT TERM GOALS: Target date: 12/10/2021  Pt will be independent with initial HEP for improved strength, balance, transfers and gait.  Baseline: 12/10/2021 Established and updated, pt compliant. Goal status: MET  2.  Pt will improve 5 x STS to less than or equal to 30s seconds w/BUE support and CGA to demonstrate improved functional strength and transfer efficiency.   Baseline: 35.63s w/BUE support and min A; 12/10/2021 45.44 sec w/ BUE support and 1 instance of minA to correct fwd LOB Goal status: NOT MET  3.  Pt will improve gait velocity to at least 0.9 ft/s w/LRAD for improved gait efficiency   Baseline: 0.75f/s w/RW; 12/10/2021 0.72 ft/sec w/ RW Goal status: ONGOING  4.  Berg to be performed at 30 day STG assessment and LTG written  Baseline: 21/56 Goal status: MET  5.  TUG to be performed at 30 day STG assessment and LTG written  Baseline:  Goal status: MET  6.  Pt will ambulate 250' w/LRAD and CGA for improved functional mobility and endurance  Baseline:  Goal status: MET  LONG TERM GOALS: Target date: 01/07/2022  Pt will improve 5 x STS to less than or equal to 25 seconds w/BUE support mod I to demonstrate improved functional strength and transfer efficiency.   Baseline: 35.63s w/BUE support and min A; 01/07/2022 34.31 sec and CGA Goal status: NOT MET  2.  Pt will improve gait velocity to at least 1.4 ft/s w/LRAD for improved gait efficiency and performance at limited community ambulator level   Baseline: 0.678fs w/RW; 01/07/2022 0.73 ft/sec w/ RW Goal status: NOT  MET  3.  Pt will increase BERG balance score to >/=26/56 to demonstrate improved static balance. Baseline: 21/56 Goal status: INITIAL  4.  Pt will improve normal TUG to less than or equal to an average of 45s w/RW and S* for improved functional mobility and decreased fall risk.  Baseline: AVG of 3 trials: 59.28s w/RW and CGA; 01/07/2022 56.75 sec w/ RW and CGA Goal status: NOT MET  5.  Pt will ambulate >400' w/RW and LRAD w/S* and without rest break for improved functional mobility and endurance  Baseline: 01/07/2022 244' w/ RW CGA-close S* w/ 2 standing rest breaks <10 sec Goal status: NOT MET  NEW GOALS: SHORT TERM GOALS: Target date: 02/07/2022  1.  Pt will improve 5 x STS to less than or equal to 30s seconds w/BUE support and CGA to  demonstrate improved functional strength and transfer efficiency.   Baseline: 01/07/2022 34.31 sec and CGA Goal status: INITIAL  2.  Pt will improve gait velocity to at least 0.8 ft/s w/LRAD for improved gait efficiency.  Baseline: 01/07/2022 0.73 ft/sec w/ RW Goal status: INITIAL  3.  Pt will increase BERG balance score to >/=26/56 to demonstrate improved static balance. Baseline: 21/56 Goal status: INITIAL  4.  Pt will improve normal TUG to </= 50sec w/RW and S* for improved functional mobility and decreased fall risk. Baseline: 01/07/2022 56.75 sec w/ RW and CGA Goal status: INITIAL  5.  Pt will ambulate >400' w/ LRAD w/S* and without rest break for improved functional mobility and endurance. Baseline: 01/07/2022 244' w/ RW CGA-close S* w/ 2 standing rest breaks <10 sec Goal status: INITIAL  LONG TERM GOALS: Target date: 03/07/2022  Pt will improve 5 x STS to less than or equal to 25 seconds w/BUE support mod I to demonstrate improved functional strength and transfer efficiency.   Baseline: 01/07/2022 34.31 sec and CGA Goal status: INITIAL  2.  Pt will improve gait velocity to at least 1.0 ft/s w/LRAD for improved gait efficiency and  performance at limited household ambulator level.   Baseline: 01/07/2022 0.73 ft/sec w/ RW Goal status: INITIAL  3.  Pt will increase BERG balance score to >/=30/56 to demonstrate improved static balance. Baseline: 21/56 Goal status: INITIAL  4.  Pt will improve normal TUG to less than or equal to an average of 45s w/RW and S* for improved functional mobility and decreased fall risk.  Baseline: 01/07/2022 56.75 sec w/ RW and CGA Goal status:  INITIAL  5.  Pt will ambulate >500' on level surfaces w/ LRAD w/S* and without rest break for improved functional mobility and endurance  Baseline: 01/07/2022 244' w/ RW CGA-close S* w/ 2 standing rest breaks <10 sec Goal status: INITIAL  5.  Pt will be independent and compliant to advanced and finalized HEP to promote LE coordination and improved balance. Baseline: Needs advancement. Goal status: INITIAL  ASSESSMENT:  CLINICAL IMPRESSION: Assessed LTGs this visit with plan for re-cert maintaining same frequency due to patient's potential and current progress.  Pt continues to be limited by variation in ambulatory tolerance noted by slightly decreased distance achieved this visit compared to STG assessment.  Pt ambulates with a speed of 0.73 ft/sec which is consistent w/ assessment prior.  TUG taken this session was not an average, but pt completes at [facility] level w/ RW in 56.75 seconds.  His 5xSTS w/ BUE support improved from prior to 34.31 seconds w/ CGA.  His BERG will be completed at next session for ongoing assessment of fall risk.  He continues to benefit from skilled PT to address severe ongoing coordination deficits, safety of upright mobility, and activity tolerance w/ LRAD.  OBJECTIVE IMPAIRMENTS Abnormal gait, decreased activity tolerance, decreased balance, decreased cognition, decreased coordination, decreased endurance, decreased mobility, difficulty walking, decreased safety awareness, and prosthetic dependency .   ACTIVITY LIMITATIONS  carrying, lifting, bending, sitting, standing, squatting, stairs, transfers, bed mobility, reach over head, and locomotion level  PARTICIPATION LIMITATIONS: meal prep, cleaning, laundry, interpersonal relationship, driving, shopping, community activity, and yard work  PERSONAL FACTORS Age, Fitness, Past/current experiences, Time since onset of injury/illness/exacerbation, Transportation, and 1 comorbidity: L BKA are also affecting patient's functional outcome.   REHAB POTENTIAL: Fair due to history of noncompliance w/PT and decreased social support system   CLINICAL DECISION MAKING: Evolving/moderate complexity  EVALUATION COMPLEXITY: Moderate  PLAN:  PT FREQUENCY: 2x/week  PT DURATION: 8 weeks  PLANNED INTERVENTIONS: Therapeutic exercises, Therapeutic activity, Neuromuscular re-education, Balance training, Gait training, Patient/Family education, Self Care, Stair training, Visual/preceptual remediation/compensation, Prosthetic training, DME instructions, Manual therapy, and Re-evaluation  PLAN FOR NEXT SESSION:  Finish assessing BERG, update baseline for new LTGs, may want to redo TUG w/ avg vs single time from session prior.  Experiment w/weighted vest/adding weight to pelvis w/gait. Modify/progress HEP prn, ladder drills, dot drills, SciFit intervals for endurance, SIT <>STANDS-push up not fwd, eccentric control of quads, trial ankle wts w/ standing march vs steps in // bars, standing wide stance w/ band around thighs - banded STS seemed to provide helpful proprioceptive input, standing variable stance at countertop progressing to no UE support as able    Bary Richard, PT, DPT 01/07/2022, 2:49 PM

## 2022-01-07 NOTE — Progress Notes (Signed)
Reviewed: I agree with Dr. Koval's documentation and management. 

## 2022-01-07 NOTE — Assessment & Plan Note (Signed)
Hyperlipidemia -Restart atorvastatin 80 mg daily given ASCVD.

## 2022-01-07 NOTE — Assessment & Plan Note (Signed)
Tobacco use disorder with mild-moderate nicotine long-term dependence in a patient who is good candidate for success because of previous success with quit attempts and motivation to quit. Patient rates motivation and confidence in quitting as high. -Initiated nicotine replacement tx with nicotine gum as needed for cravings. Patient counseled on purpose, proper use, and potential adverse effects, including GI upset.   -Initiated varenicline 0.5 mg by mouth once daily with food x7 days, then 0.5 mg by mouth twice daily with food thereafter. Patient counseled on purpose, proper use, and potential adverse effects, including GI upset.Marland Kitchen

## 2022-01-09 NOTE — Therapy (Incomplete)
OUTPATIENT PHYSICAL THERAPY TREATMENT NOTE   Patient Name: Kristopher Ruiz. MRN: [id number] DOB:Jun 25, 1950, 71 y.o., male 27 Date: 01/07/2022  PCP: Alen Bleacher, MD REFERRING PROVIDER: Lenoria Chime, MD     END OF SESSION:   PT End of Session - 01/07/22 1404     Visit Number 15    Number of Visits 17   Plus eval   Date for PT Re-Evaluation 01/14/22    Authorization Type UHC Medicare/Medicaid of [place]    Progress Note Due on Visit 10    PT Start Time 1401    PT Stop Time 1445    PT Time Calculation (min) 44 min    Equipment Utilized During Treatment Gait belt    Activity Tolerance Patient tolerated treatment well    Behavior During Therapy WFL for tasks assessed/performed                  Past Medical History:  Diagnosis Date  . Arthritis   . Cataract    NS OU  . Hypertension   . Stroke [facility])    Past Surgical History:  Procedure Laterality Date  . HIP FRACTURE SURGERY    . LEG AMPUTATION BELOW KNEE     left leg  . TOTAL HIP ARTHROPLASTY Left 10/31/2019  . TOTAL HIP ARTHROPLASTY Left 10/31/2019   Procedure: LEFT TOTAL HIP ARTHROPLASTY, POSTERIOR;  Surgeon: Leandrew Koyanagi, MD;  Location: [place];  Service: Orthopedics;  Laterality: Left;   Patient Active Problem List   Diagnosis Date Noted  . Anemia 07/06/2020  . 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 Closed displaced fracture of left femoral neck with delayed healing 09/28/2019  . Establishing care with new doctor, encounter for 06/01/2019  . TIA (transient ischemic attack) 09/13/2018  . 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 Hypoglycemia due to insulin 09/06/2018  . Tobacco abuse 09/06/2018  . Hyperlipidemia 09/06/2018  . Essential hypertension 09/06/2018  . 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 No falls.   PAIN:  Are you having pain? No   OBJECTIVE: (objective measures completed at initial evaluation unless otherwise dated)   TODAY'S TREATMENT: Assessed LTGs: -Verbally reviewed addition to HEP. -5xSTS BUE support and CGA 34.31 sec -10MWT CGA-SBA 45.22 sec = 0.22 m/sec OR 0.73 ft/sec -Pt ambulates 127' w/ CGA-SBA using RW prior to signs of fatigue and pt initiating rest break, second bout of ambulation w/ RW predominantly SBA/close supervision level 244', pt requires 2 standing rest breaks of <10 seconds. -TUG w/ RW and CGA:  56.75 sec -Initiated BERG assessment:  [facility] PT Assessment - 01/07/22 1441       Berg Balance Test   Sit to Stand Able to stand  independently using hands    Standing Unsupported Able to stand 30 seconds unsupported   Completed 1 min and 40 sec before falling backwards.  Sitting with Back Unsupported but Feet Supported on Floor or Stool Able to sit safely and securely 2 minutes    Stand to Sit Controls descent by using hands               PATIENT EDUCATION: Education details:  Continue HEP, next appointment time. Person educated: Patient Education method: Customer service manager Education comprehension: verbalized understanding and needs further education   HOME EXERCISE PROGRAM: Access Code: [email or web address] URL: [email or web address] Date: 12/31/2021 Prepared by: Mickie Bail Plaster  Exercises - Single Leg Bridge  - 1 x daily - 5 x weekly - 2 sets - 8 reps - Sit to Stand with  Resistance Around Legs  - 1 x daily - 5 x weekly - 2 sets - 10 reps - Standing March with Counter Support  - 1 x daily - 7 x weekly - 3 sets - 10 reps - Standing Hip Extension with Counter Support  - 1 x daily - 7 x weekly - 3 sets - 10 reps - Side Stepping with Resistance at Thighs and Counter Support  - 1 x daily - 7 x weekly - 3 sets - 10 reps  OLD GOALS: Goals reviewed with patient? Yes  SHORT TERM GOALS: Target date: 12/10/2021  Pt will be independent with initial HEP for improved strength, balance, transfers and gait.  Baseline: 12/10/2021 Established and updated, pt compliant. Goal status: MET  2.  Pt will improve 5 x STS to less than or equal to 30s seconds w/BUE support and CGA to demonstrate improved functional strength and transfer efficiency.   Baseline: 35.63s w/BUE support and min A; 12/10/2021 45.44 sec w/ BUE support and 1 instance of minA to correct fwd LOB Goal status: NOT MET  3.  Pt will improve gait velocity to at least 0.9 ft/s w/LRAD for improved gait efficiency   Baseline: 0.56ft/s w/RW; 12/10/2021 0.72 ft/sec w/ RW Goal status: ONGOING  4.  Berg to be performed at 30 day STG assessment and LTG written  Baseline: 21/56 Goal status: MET  5.  TUG to be performed at 30 day STG assessment and LTG written  Baseline:  Goal status: MET  6.  Pt will ambulate 250' w/LRAD and CGA for improved functional mobility and endurance  Baseline:  Goal status: MET  LONG TERM GOALS: Target date: 01/07/2022  Pt will improve 5 x STS to less than or equal to 25 seconds w/BUE support mod I to demonstrate improved functional strength and transfer efficiency.   Baseline: 35.63s w/BUE support and min A; 01/07/2022 34.31 sec and CGA Goal status: NOT MET  2.  Pt will improve gait velocity to at least 1.4 ft/s w/LRAD for improved gait efficiency and performance at limited community ambulator level   Baseline: 0.74ft/s w/RW; 01/07/2022 0.73 ft/sec w/ RW Goal status: NOT MET  3.   Pt will increase BERG balance score to >/=26/56 to demonstrate improved static balance. Baseline: 21/56 Goal status: INITIAL  4.  Pt will improve normal TUG to less than or equal to an average of 45s w/RW and S* for improved functional mobility and decreased fall risk.  Baseline: AVG of 3 trials: 59.28s w/RW and CGA; 01/07/2022 56.75 sec w/ RW and CGA Goal status: NOT MET  5.  Pt will ambulate >400' w/RW and LRAD w/S* and without rest break for improved functional mobility and endurance  Baseline: 01/07/2022 244' w/ RW CGA-close S* w/ 2 standing rest breaks <10 sec Goal status: NOT MET  NEW GOALS: SHORT TERM  GOALS: Target date: 02/07/2022  1.  Pt will improve 5 x STS to less than or equal to 30s seconds w/BUE support and CGA to demonstrate improved functional strength and transfer efficiency.   Baseline: 01/07/2022 34.31 sec and CGA Goal status: ONGOING  2.  Pt will improve gait velocity to at least 0.9 ft/s w/LRAD for improved gait efficiency   Baseline: 0.25f/s w/RW; 12/10/2021 0.72 ft/sec w/ RW Goal status: ONGOING  3.  Berg to be performed at 30 day STG assessment and LTG written  Baseline: 21/56 Goal status: MET  4.  TUG to be performed at 30 day STG assessment and LTG written  Baseline:  Goal status: MET  5.  Pt will ambulate 250' w/LRAD and CGA for improved functional mobility and endurance  Baseline:  Goal status: MET  LONG TERM GOALS: Target date: 03/07/2022  Pt will improve 5 x STS to less than or equal to 25 seconds w/BUE support mod I to demonstrate improved functional strength and transfer efficiency.   Baseline: 01/07/2022 34.31 sec and CGA Goal status: NOT MET  2.  Pt will improve gait velocity to at least 1.4 ft/s w/LRAD for improved gait efficiency and performance at limited community ambulator level   Baseline: 0.648fs w/RW; 01/07/2022 0.73 ft/sec w/ RW Goal status: NOT MET  3.  Pt will increase BERG balance score to >/=26/56 to demonstrate improved  static balance. Baseline: 21/56 Goal status: INITIAL  4.  Pt will improve normal TUG to less than or equal to an average of 45s w/RW and S* for improved functional mobility and decreased fall risk.  Baseline: AVG of 3 trials: 59.28s w/RW and CGA; 01/07/2022 56.75 sec w/ RW and CGA Goal status: NOT MET  5.  Pt will ambulate >400' w/RW and LRAD w/S* and without rest break for improved functional mobility and endurance  Baseline: 01/07/2022 244' w/ RW CGA-close S* w/ 2 standing rest breaks <10 sec Goal status: NOT MET  ASSESSMENT:  CLINICAL IMPRESSION: Assessed LTGs this visit with plan for re-cert maintaining same frequency due to patient's potential and current progress.    OBJECTIVE IMPAIRMENTS Abnormal gait, decreased activity tolerance, decreased balance, decreased cognition, decreased coordination, decreased endurance, decreased mobility, difficulty walking, decreased safety awareness, and prosthetic dependency .   ACTIVITY LIMITATIONS carrying, lifting, bending, sitting, standing, squatting, stairs, transfers, bed mobility, reach over head, and locomotion level  PARTICIPATION LIMITATIONS: meal prep, cleaning, laundry, interpersonal relationship, driving, shopping, community activity, and yard work  PERSONAL FACTORS Age, Fitness, Past/current experiences, Time since onset of injury/illness/exacerbation, Transportation, and 1 comorbidity: L BKA are also affecting patient's functional outcome.   REHAB POTENTIAL: Fair due to history of noncompliance w/PT and decreased social support system   CLINICAL DECISION MAKING: Evolving/moderate complexity  EVALUATION COMPLEXITY: Moderate  PLAN: PT FREQUENCY: 2x/week  PT DURATION: 8 weeks  PLANNED INTERVENTIONS: Therapeutic exercises, Therapeutic activity, Neuromuscular re-education, Balance training, Gait training, Patient/Family education, Self Care, Stair training, Visual/preceptual remediation/compensation, Prosthetic training, DME  instructions, Manual therapy, and Re-evaluation  PLAN FOR NEXT SESSION:  Finish assessing BERG, update baseline for new LTGs.  Experiment w/weighted vest/adding weight to pelvis w/gait. Modify/progress HEP prn, ladder drills, dot drills, SciFit intervals for endurance, SIT <>STANDS-push up not fwd, eccentric control of quads, trial ankle wts w/ standing march vs steps in // bars, standing wide stance w/ band around thighs - banded STS seemed to provide helpful proprioceptive input, standing variable stance at countertop progressing to no UE support as able    [employer]  Lorene Dy, PT, DPT 01/07/2022, 2:49 PM

## 2022-01-10 ENCOUNTER — Ambulatory Visit: Payer: Medicare Other | Admitting: Physical Therapy

## 2022-01-10 DIAGNOSIS — R278 Other lack of coordination: Secondary | ICD-10-CM

## 2022-01-10 DIAGNOSIS — M6281 Muscle weakness (generalized): Secondary | ICD-10-CM

## 2022-01-10 DIAGNOSIS — R260 Ataxic gait: Secondary | ICD-10-CM

## 2022-01-10 NOTE — Therapy (Signed)
OUTPATIENT PHYSICAL THERAPY TREATMENT NOTE   Patient Name: Kristopher Ruiz. MRN: [id number] DOB:12-28-1950, 71 y.o., male 82 Date: 01/10/2022  PCP: Alen Bleacher, MD REFERRING PROVIDER: Lenoria Chime, MD     END OF SESSION:   PT End of Session - 01/10/22 1404     Visit Number 16    Number of Visits 74   82+70   Date for PT Re-Evaluation 78/67/54   re-certed on 49/20/1007   Authorization Type UHC Medicare/Medicaid of [place]    Progress Note Due on Visit 20    PT Start Time 1403    PT Stop Time 1444    PT Time Calculation (min) 41 min    Equipment Utilized During Treatment Gait belt    Activity Tolerance Patient tolerated treatment well    Behavior During Therapy WFL for tasks assessed/performed               Past Medical History:  Diagnosis Date   Arthritis    Cataract    NS OU   Hypertension    Stroke [facility])    Past Surgical History:  Procedure Laterality Date   HIP FRACTURE SURGERY     LEG AMPUTATION BELOW KNEE     left leg   TOTAL HIP ARTHROPLASTY Left 10/31/2019   TOTAL HIP ARTHROPLASTY Left 10/31/2019   Procedure: LEFT TOTAL HIP ARTHROPLASTY, POSTERIOR;  Surgeon: Leandrew Koyanagi, MD;  Location: [facility];  Service: Orthopedics;  Laterality: Left;   Patient Active Problem List   Diagnosis Date Noted   Anemia 07/06/2020   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   Closed displaced fracture of left femoral neck with delayed healing 09/28/2019   Establishing care with new doctor, encounter for 06/01/2019   TIA (transient ischemic attack) 09/13/2018   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   Hypoglycemia due to insulin 09/06/2018   Tobacco abuse 09/06/2018   Hyperlipidemia 09/06/2018   Essential hypertension 09/06/2018   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 No falls or new changes. Exercises are going well.   PAIN:  Are you having pain? No   OBJECTIVE: (objective measures completed at initial evaluation unless otherwise dated)   TODAY'S TREATMENT:  Ther Act   [facility] PT Assessment - 01/10/22 1408       Berg Balance Test   Sit to Stand Able to stand  independently using hands    Standing Unsupported Able to stand 2 minutes with supervision   retested from 10/10   Sitting with Back Unsupported but Feet Supported on Floor or Stool Able to sit safely and securely 2 minutes    Stand to Sit Controls descent by using hands    Transfers Able to transfer safely, definite need of hands    Standing Unsupported with Eyes Closed Able to stand 10 seconds with supervision    Standing Unsupported with Feet Together Able to place feet together independently but unable to hold for 30 seconds   held for 24s w/S*   From Standing, Reach  Forward with Outstretched Arm Can reach forward >12 cm safely (5")    From Standing Position, Pick up Object from [place] to pick up shoe, needs supervision    From Standing Position, Turn to Look Behind Over each Shoulder Looks behind one side only/other side shows less weight shift   Increased shift to R side   Turn 360 Degrees Needs assistance while turning    Standing Unsupported, Alternately Place Feet on Step/Stool Needs assistance to keep from falling or unable to try    Standing Unsupported, One Foot in Front Needs help to step but can hold 15 seconds    Standing on One Leg Unable to try or needs assist to prevent fall   Mod A to prevent fall   Total Score 31    Berg comment: High fall risk             Gait  Training  Gait pattern: step through pattern, decreased step length- Right, decreased stance time- Left, decreased stride length, decreased hip/knee flexion- Right, decreased hip/knee flexion- Left, scissoring, ataxic, decreased trunk rotation, trunk flexed, wide BOS, and narrow BOS Distance walked: 115' Assistive device utilized: Environmental consultant - 2 wheeled Level of assistance: SBA Comments: Pt continues to demonstrate fluctuating scissoring of BLEs and wide BOS, hitting the wheels of his walker. Noted increased truncal and limb ataxia today, pt reported he had not rested well the night prior.    PATIENT EDUCATION: Education details: Goal outcomes, Continue HEP, next appointment time. Brief education regarding proper stand pivot transfers, as pt demonstrated very unsafe squat pivot from transport chair to mat table during beginning of session and pt states that is how he "always does it at home". Encouraged pt to perform proper stand pivot w/RW to continue to practice turns, as he has difficulty with this, and for improved safety with transfers  Person educated: Patient Education method: Explanation and Demonstration Education comprehension: verbalized understanding and needs further education   HOME EXERCISE PROGRAM: Access Code: [email or web address] URL: [email or web address] Date: 12/31/2021 Prepared by: Mickie Bail Kerstin Crusoe  Exercises - Single Leg Bridge  - 1 x daily - 5 x weekly - 2 sets - 8 reps - Sit to Stand with Resistance Around Legs  - 1 x daily - 5 x weekly - 2 sets - 10 reps - Standing March with Counter Support  - 1 x daily - 7 x weekly - 3 sets - 10 reps - Standing Hip Extension with Counter Support  - 1 x daily - 7 x weekly - 3 sets - 10 reps - Side Stepping with Resistance at Thighs and Counter Support  - 1 x daily - 7 x weekly - 3 sets - 10 reps  OLD GOALS: Goals reviewed with patient? Yes   LONG TERM GOALS: Target date: 01/07/2022  Pt will improve 5 x STS to less than or  equal to 25 seconds w/BUE support mod I to demonstrate improved functional strength and transfer efficiency.   Baseline: 35.63s w/BUE support and min A; 01/07/2022 34.31 sec and CGA Goal status: NOT MET  2.  Pt will improve gait velocity to at least 1.4 ft/s w/LRAD for improved gait efficiency and performance at limited community ambulator level   Baseline: 0.38f/s w/RW; 01/07/2022 0.73 ft/sec w/ RW Goal status: NOT MET  3.  Pt will increase BERG balance score to >/=26/56 to demonstrate improved static balance. Baseline: 21/56; 31/56 on 10/13 Goal status: MET  4.  Pt will improve normal TUG to less than or  equal to an average of 45s w/RW and S* for improved functional mobility and decreased fall risk.  Baseline: AVG of 3 trials: 59.28s w/RW and CGA; 01/07/2022 56.75 sec w/ RW and CGA Goal status: NOT MET  5.  Pt will ambulate >400' w/RW and LRAD w/S* and without rest break for improved functional mobility and endurance  Baseline: 01/07/2022 244' w/ RW CGA-close S* w/ 2 standing rest breaks <10 sec Goal status: NOT MET  NEW GOALS: SHORT TERM GOALS: Target date: 02/07/2022  1.  Pt will improve 5 x STS to less than or equal to 30s seconds w/BUE support and CGA to demonstrate improved functional strength and transfer efficiency.   Baseline: 01/07/2022 34.31 sec and CGA Goal status: INITIAL  2.  Pt will improve gait velocity to at least 0.8 ft/s w/LRAD for improved gait efficiency.  Baseline: 01/07/2022 0.73 ft/sec w/ RW Goal status: INITIAL  3.  Pt will increase BERG balance score to >/=34/56 to demonstrate improved static balance. Baseline: 31/56  Goal status: REVISED   4.  Pt will improve normal TUG to </= 50sec w/RW and S* for improved functional mobility and decreased fall risk. Baseline: 01/07/2022 56.75 sec w/ RW and CGA Goal status: INITIAL  5.  Pt will ambulate >400' w/ LRAD w/S* and without rest break for improved functional mobility and endurance. Baseline:  01/07/2022 244' w/ RW CGA-close S* w/ 2 standing rest breaks <10 sec Goal status: INITIAL  LONG TERM GOALS: Target date: 03/07/2022  Pt will improve 5 x STS to less than or equal to 25 seconds w/BUE support mod I to demonstrate improved functional strength and transfer efficiency.   Baseline: 01/07/2022 34.31 sec and CGA Goal status: INITIAL  2.  Pt will improve gait velocity to at least 1.0 ft/s w/LRAD for improved gait efficiency and performance at limited household ambulator level.   Baseline: 01/07/2022 0.73 ft/sec w/ RW Goal status: INITIAL  3.  Pt will increase BERG balance score to >/=37/56 to demonstrate improved static balance. Baseline: 31/56 Goal status: REVISED   4.  Pt will improve normal TUG to less than or equal to an average of 45s w/RW and S* for improved functional mobility and decreased fall risk.  Baseline: 01/07/2022 56.75 sec w/ RW and CGA Goal status:  INITIAL  5.  Pt will ambulate >500' on level surfaces w/ LRAD w/S* and without rest break for improved functional mobility and endurance  Baseline: 01/07/2022 244' w/ RW CGA-close S* w/ 2 standing rest breaks <10 sec Goal status: INITIAL  5.  Pt will be independent and compliant to advanced and finalized HEP to promote LE coordination and improved balance. Baseline: Needs advancement. Goal status: INITIAL  ASSESSMENT:  CLINICAL IMPRESSION: Emphasis of skilled PT session on completing old LTG assessment. Pt scored a 31/56 on Berg, indicative of high fall risk but a significant improvement from his baseline score of 21. Pt continues to demonstrate the most difficulty w/turns, narrow BOS, lateral weight shifting and singe leg stability. Pt demonstrated increased truncal and limb ataxia today and exhibited very risky squat pivot transfer during beginning of session. Educated pt on proper stand pivot technique to practice turning at home w/RW and for reduced fall risk. Pt verbalized understanding. Continue POC.    OBJECTIVE IMPAIRMENTS Abnormal gait, decreased activity tolerance, decreased balance, decreased cognition, decreased coordination, decreased endurance, decreased mobility, difficulty walking, decreased safety awareness, and prosthetic dependency .   ACTIVITY LIMITATIONS carrying, lifting, bending, sitting, standing, squatting, stairs, transfers, bed mobility,  reach over head, and locomotion level  PARTICIPATION LIMITATIONS: meal prep, cleaning, laundry, interpersonal relationship, driving, shopping, community activity, and yard work  PERSONAL FACTORS Age, Fitness, Past/current experiences, Time since onset of injury/illness/exacerbation, Transportation, and 1 comorbidity: L BKA are also affecting patient's functional outcome.   REHAB POTENTIAL: Fair due to history of noncompliance w/PT and decreased social support system   CLINICAL DECISION MAKING: Evolving/moderate complexity  EVALUATION COMPLEXITY: Moderate  PLAN: PT FREQUENCY: 2x/week  PT DURATION: 8 weeks  PLANNED INTERVENTIONS: Therapeutic exercises, Therapeutic activity, Neuromuscular re-education, Balance training, Gait training, Patient/Family education, Self Care, Stair training, Visual/preceptual remediation/compensation, Prosthetic training, DME instructions, Manual therapy, and Re-evaluation  PLAN FOR NEXT SESSION: Experiment w/weighted vest/adding weight to pelvis w/gait. Modify/progress HEP prn, ladder drills, dot drills, SciFit intervals for endurance, SIT <>STANDS-push up not fwd, eccentric control of quads, trial ankle wts w/ standing march vs steps in // bars, standing wide stance w/ band around thighs - banded STS seemed to provide helpful proprioceptive input, standing variable stance at countertop progressing to no UE support as able. STAND PIVOTS AND TURNS   Cruzita Lederer Jerelene Salaam, PT, DPT 01/10/2022, 2:46 PM

## 2022-01-17 ENCOUNTER — Encounter: Payer: Self-pay | Admitting: Physical Therapy

## 2022-01-17 ENCOUNTER — Ambulatory Visit: Payer: Medicare Other | Admitting: Physical Therapy

## 2022-01-17 DIAGNOSIS — S88112A Complete traumatic amputation at level between knee and ankle, left lower leg, initial encounter: Secondary | ICD-10-CM

## 2022-01-17 DIAGNOSIS — R278 Other lack of coordination: Secondary | ICD-10-CM

## 2022-01-17 DIAGNOSIS — M6281 Muscle weakness (generalized): Secondary | ICD-10-CM

## 2022-01-17 DIAGNOSIS — R260 Ataxic gait: Secondary | ICD-10-CM

## 2022-01-17 DIAGNOSIS — R2681 Unsteadiness on feet: Secondary | ICD-10-CM

## 2022-01-17 DIAGNOSIS — R293 Abnormal posture: Secondary | ICD-10-CM

## 2022-01-17 DIAGNOSIS — R2689 Other abnormalities of gait and mobility: Secondary | ICD-10-CM

## 2022-01-17 NOTE — Therapy (Signed)
OUTPATIENT PHYSICAL THERAPY TREATMENT NOTE   Patient Name: Kristopher Ruiz. MRN: [id number] DOB:07-21-1950, 71 y.o., male 5 Date: 01/17/2022  PCP: Alen Bleacher, MD REFERRING PROVIDER: Lenoria Chime, MD     END OF SESSION:   PT End of Session - 01/17/22 1408     Visit Number 17    Number of Visits 01   09+32   Date for PT Re-Evaluation 35/57/32   re-certed on 20/25/4270   Authorization Type UHC Medicare/Medicaid of [place]    Progress Note Due on Visit 20    PT Start Time 1406    Equipment Utilized During Treatment Gait belt    Activity Tolerance Patient tolerated treatment well    Behavior During Therapy WFL for tasks assessed/performed               Past Medical History:  Diagnosis Date   Arthritis    Cataract    NS OU   Hypertension    Stroke [facility])    Past Surgical History:  Procedure Laterality Date   HIP FRACTURE SURGERY     LEG AMPUTATION BELOW KNEE     left leg   TOTAL HIP ARTHROPLASTY Left 10/31/2019   TOTAL HIP ARTHROPLASTY Left 10/31/2019   Procedure: LEFT TOTAL HIP ARTHROPLASTY, POSTERIOR;  Surgeon: Leandrew Koyanagi, MD;  Location: [facility];  Service: Orthopedics;  Laterality: Left;   Patient Active Problem List   Diagnosis Date Noted   Anemia 07/06/2020   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   Closed displaced fracture of left femoral neck with delayed healing 09/28/2019   Establishing care with new doctor, encounter for 06/01/2019   TIA (transient ischemic attack) 09/13/2018   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   Hypoglycemia due to insulin 09/06/2018   Tobacco abuse 09/06/2018   Hyperlipidemia 09/06/2018   Essential hypertension 09/06/2018   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[place]) 04/16/2017    REFERRING DIAG: I63.9 (ICD-10-CM) - Cerebellar stroke (HCC)   THERAPY DIAG:  Muscle weakness (generalized)  Other lack of coordination  Ataxic gait  Abnormal posture  Unsteadiness on feet  Other abnormalities of  gait and mobility  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HCC)  Rationale for Evaluation and Treatment Rehabilitation  PERTINENT HISTORY: Hx of L BKA, CVA, HTN, 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  He fell today, but he got up.  He pushed his RW too far in front of him while walking in his home and lost his balance.  He fell forward landing on his knees and was able to get himself up by putting RLE forward and pulling up on the RW.  He has a mild closed scrape over the right tibial tuberosity (PT views, edu to keep clean and dry as able).  PAIN:  Are you having pain? No   OBJECTIVE: (objective measures completed at initial evaluation unless otherwise dated)   TODAY'S TREATMENT:  GAIT: Gait pattern: {gait characteristics:25376} Distance walked: 34' + 115' + various clinic distances Assistive device utilized: Environmental consultant - 2 wheeled Level of assistance: CGA Comments: 3# bilateral weights on greater trochanters on gait belt to promote proprioceptive input.      3# bilaterally on gait belt over greater trochanters for remainder of session.  -// bars 2x8' BUE > LUE and CGA  4x8', cued for turns -Standing marches x20 BUE support > standing marches x20 RUE support -      PATIENT EDUCATION: Education details: Goal outcomes, Continue HEP, next appointment time. Brief education regarding proper stand pivot transfers, as pt demonstrated very unsafe squat pivot from transport chair to mat table during beginning of session and pt states that is how he "always does it at home". Encouraged pt to perform proper stand pivot w/RW to continue to practice turns, as he has difficulty with this, and for improved safety with transfers  Person educated: Patient Education method: Explanation and Demonstration Education comprehension:  verbalized understanding and needs further education   HOME EXERCISE PROGRAM: Access Code: [email or web address] URL: [email or web address] Date: 12/31/2021 Prepared by: Mickie Bail Plaster  Exercises - Single Leg Bridge  - 1 x daily - 5 x weekly - 2 sets - 8 reps - Sit to Stand with Resistance Around Legs  - 1 x daily - 5 x weekly - 2 sets - 10 reps - Standing March with Counter Support  - 1 x daily - 7 x weekly - 3 sets - 10 reps - Standing Hip Extension with Counter Support  - 1 x daily - 7 x weekly - 3 sets - 10 reps - Side Stepping with Resistance at Thighs and Counter Support  - 1 x daily - 7 x weekly - 3 sets - 10 reps  OLD GOALS: Goals reviewed with patient? Yes   LONG TERM GOALS: Target date: 01/07/2022  Pt will improve 5 x STS to less than or equal to 25 seconds w/BUE support mod I to demonstrate improved functional strength and transfer efficiency.   Baseline: 35.63s w/BUE support and min A; 01/07/2022 34.31 sec and CGA Goal status: NOT MET  2.  Pt will improve gait velocity to at least 1.4 ft/s w/LRAD for improved gait efficiency and performance at limited community ambulator level   Baseline: 0.69f/s w/RW; 01/07/2022 0.73 ft/sec w/ RW Goal status: NOT MET  3.  Pt will increase BERG balance score to >/=26/56 to demonstrate improved static balance. Baseline: 21/56; 31/56 on 10/13 Goal status: MET  4.  Pt will improve normal TUG to less than or equal to an average of 45s w/RW and S* for improved functional mobility and decreased fall risk.  Baseline: AVG of 3 trials: 59.28s w/RW and CGA; 01/07/2022 56.75 sec w/ RW and CGA Goal status: NOT MET  5.  Pt will ambulate >400' w/RW and LRAD w/S* and without rest break for improved functional mobility and endurance  Baseline: 01/07/2022 244' w/ RW CGA-close S* w/ 2 standing rest breaks <10 sec Goal status: NOT MET  NEW GOALS: SHORT TERM GOALS: Target date: 02/07/2022  1.  Pt will improve 5 x STS to less than or equal to  30s seconds w/BUE support and CGA to demonstrate improved functional strength and transfer efficiency.   Baseline: 01/07/2022 34.31 sec and CGA Goal status: INITIAL  2.  Pt will improve gait velocity to at least 0.8 ft/s w/LRAD for improved gait efficiency.  Baseline: 01/07/2022 0.73 ft/sec w/ RW Goal status: INITIAL  3.  Pt will increase BERG balance score to >/=34/56 to demonstrate improved static balance. Baseline: 31/56  Goal status: REVISED   4.  Pt will improve normal TUG to </= 50sec w/RW and S* for improved functional mobility and decreased fall risk. Baseline: 01/07/2022 56.75 sec w/ RW and CGA Goal status: INITIAL  5.  Pt will ambulate >400' w/ LRAD w/S* and without rest  break for improved functional mobility and endurance. Baseline: 01/07/2022 244' w/ RW CGA-close S* w/ 2 standing rest breaks <10 sec Goal status: INITIAL  LONG TERM GOALS: Target date: 03/07/2022  Pt will improve 5 x STS to less than or equal to 25 seconds w/BUE support mod I to demonstrate improved functional strength and transfer efficiency.   Baseline: 01/07/2022 34.31 sec and CGA Goal status: INITIAL  2.  Pt will improve gait velocity to at least 1.0 ft/s w/LRAD for improved gait efficiency and performance at limited household ambulator level.   Baseline: 01/07/2022 0.73 ft/sec w/ RW Goal status: INITIAL  3.  Pt will increase BERG balance score to >/=37/56 to demonstrate improved static balance. Baseline: 31/56 Goal status: REVISED   4.  Pt will improve normal TUG to less than or equal to an average of 45s w/RW and S* for improved functional mobility and decreased fall risk.  Baseline: 01/07/2022 56.75 sec w/ RW and CGA Goal status:  INITIAL  5.  Pt will ambulate >500' on level surfaces w/ LRAD w/S* and without rest break for improved functional mobility and endurance  Baseline: 01/07/2022 244' w/ RW CGA-close S* w/ 2 standing rest breaks <10 sec Goal status: INITIAL  5.  Pt will be  independent and compliant to advanced and finalized HEP to promote LE coordination and improved balance. Baseline: Needs advancement. Goal status: INITIAL  ASSESSMENT:  CLINICAL IMPRESSION:   OBJECTIVE IMPAIRMENTS Abnormal gait, decreased activity tolerance, decreased balance, decreased cognition, decreased coordination, decreased endurance, decreased mobility, difficulty walking, decreased safety awareness, and prosthetic dependency .   ACTIVITY LIMITATIONS carrying, lifting, bending, sitting, standing, squatting, stairs, transfers, bed mobility, reach over head, and locomotion level  PARTICIPATION LIMITATIONS: meal prep, cleaning, laundry, interpersonal relationship, driving, shopping, community activity, and yard work  PERSONAL FACTORS Age, Fitness, Past/current experiences, Time since onset of injury/illness/exacerbation, Transportation, and 1 comorbidity: L BKA are also affecting patient's functional outcome.   REHAB POTENTIAL: Fair due to history of noncompliance w/PT and decreased social support system   CLINICAL DECISION MAKING: Evolving/moderate complexity  EVALUATION COMPLEXITY: Moderate  PLAN: PT FREQUENCY: 2x/week  PT DURATION: 8 weeks  PLANNED INTERVENTIONS: Therapeutic exercises, Therapeutic activity, Neuromuscular re-education, Balance training, Gait training, Patient/Family education, Self Care, Stair training, Visual/preceptual remediation/compensation, Prosthetic training, DME instructions, Manual therapy, and Re-evaluation  PLAN FOR NEXT SESSION: Experiment w/weighted vest/adding weight to pelvis w/gait. Modify/progress HEP prn, ladder drills, dot drills, SciFit intervals for endurance, SIT <>STANDS-push up not fwd, eccentric control of quads, trial ankle wts w/ standing march vs steps in // bars, standing wide stance w/ band around thighs - banded STS seemed to provide helpful proprioceptive input, standing variable stance at countertop progressing to no UE support  as able. STAND PIVOTS AND TURNS   Bary Richard, PT, DPT 01/17/2022, 2:09 PM

## 2022-01-21 ENCOUNTER — Ambulatory Visit: Payer: Medicare Other | Admitting: Physical Therapy

## 2022-01-24 ENCOUNTER — Ambulatory Visit: Payer: Medicare Other | Admitting: Physical Therapy

## 2022-01-24 DIAGNOSIS — R278 Other lack of coordination: Secondary | ICD-10-CM

## 2022-01-24 DIAGNOSIS — R260 Ataxic gait: Secondary | ICD-10-CM

## 2022-01-24 DIAGNOSIS — M6281 Muscle weakness (generalized): Secondary | ICD-10-CM

## 2022-01-24 NOTE — Therapy (Signed)
OUTPATIENT PHYSICAL THERAPY TREATMENT NOTE   Patient Name: Kristopher Ruiz. MRN: [id number] DOB:[birth year], 71 y.o., male 34 Date: 01/24/2022  PCP: Alen Bleacher, MD REFERRING PROVIDER: Lenoria Chime, MD     END OF SESSION:   PT End of Session - 01/24/22 1351     Visit Number 18    Number of Visits 33   17+16   Date for PT Re-Evaluation [id number]   re-certed on [id number]   Authorization Type UHC Medicare/Medicaid of [place]    Progress Note Due on Visit 20    PT Start Time 1350    PT Stop Time 1432    PT Time Calculation (min) 42 min    Equipment Utilized During Treatment Gait belt    Activity Tolerance Patient tolerated treatment well    Behavior During Therapy WFL for tasks assessed/performed               Past Medical History:  Diagnosis Date   Arthritis    Cataract    NS OU   Hypertension    Stroke [facility])    Past Surgical History:  Procedure Laterality Date   HIP FRACTURE SURGERY     LEG AMPUTATION BELOW KNEE     left leg   TOTAL HIP ARTHROPLASTY Left 10/31/2019   TOTAL HIP ARTHROPLASTY Left 10/31/2019   Procedure: LEFT TOTAL HIP ARTHROPLASTY, POSTERIOR;  Surgeon: Leandrew Koyanagi, MD;  Location: [place];  Service: Orthopedics;  Laterality: Left;   Patient Active Problem List   Diagnosis Date Noted   Anemia 07/06/2020   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   Closed displaced fracture of left femoral neck with delayed healing 09/28/2019   Establishing care with new doctor, encounter for 06/01/2019   TIA (transient ischemic attack) 09/13/2018   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   Hypoglycemia due to insulin 09/06/2018   Tobacco abuse 09/06/2018   Hyperlipidemia 09/06/2018   Essential hypertension 09/06/2018   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[place]) 04/16/2017    REFERRING DIAG: I63.9 (ICD-10-CM) - Cerebellar stroke (HCC)   THERAPY DIAG:  Muscle weakness (generalized)  Other lack of coordination  Ataxic  gait  Rationale for Evaluation and Treatment Rehabilitation  PERTINENT HISTORY: Hx of L BKA, CVA, HTN, 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 His chair was not locked and he fell backwards but was able to get up independently and denied injuries.   PAIN:  Are you having pain? No   OBJECTIVE: (objective measures completed at initial evaluation unless otherwise dated)   TODAY'S TREATMENT: Ther Ex  SciFit multi-peaks level 5 for 8 minutes using BLEs only for neural priming for reciprocal movement, dynamic cardiovascular warmup and BLE strength. RPE of 10/10 following activity.   NMR  Practiced quarter turns in // bars w/BUE support, x10 per side, for proper turn sequencing, foot placement and priming for stand pivot. Pt required mod cues initially for proper technique but quickly was able to perform without cues. CGA-min A throughout due to increase in truncal and limb ataxia. Min cues for increased step clearance throughout. Pt demonstrated significant posterior lean and poor step placement throughout despite cues for upright posture and adequate BOS Using single cone, practiced full turn around cone x1 each direction for added turns practice and proper AD management. Mod verbal cues to maintain safe distance to RW as pt tends to  place walker too far away from body, resulting in anterolateral LOB. Pt improved step sequence and AD management w/turn to L > R. CGA-min A throughout Weaving through 4 cones w/RW, x2, for improved step sequence, AD management and dynamic balance. Mod verbal cues to slow down and reduce step length for improved stability. CGA-min A throughout due to lateral LOB when placing RLE too narrow   GAIT: Gait pattern: step through pattern, ataxic, trunk flexed, and narrow BOS Distance walked: various clinic distances Assistive device utilized: Environmental consultant - 2  wheeled Level of assistance: CGA and Min A Comments: Pt w/increased ataxia and scissoring of gait today     PATIENT EDUCATION: Education details: Reiterated importance of performing stand pivot transfers, continue HEP  Person educated: Patient Education method: Explanation and Demonstration Education comprehension: verbalized understanding and needs further education   HOME EXERCISE PROGRAM: Access Code: [email or web address] URL: [email or web address] Date: 12/31/2021 Prepared by: Mickie Bail Aman Batley  Exercises - Single Leg Bridge  - 1 x daily - 5 x weekly - 2 sets - 8 reps - Sit to Stand with Resistance Around Legs  - 1 x daily - 5 x weekly - 2 sets - 10 reps - Standing March with Counter Support  - 1 x daily - 7 x weekly - 3 sets - 10 reps - Standing Hip Extension with Counter Support  - 1 x daily - 7 x weekly - 3 sets - 10 reps - Side Stepping with Resistance at Thighs and Counter Support  - 1 x daily - 7 x weekly - 3 sets - 10 reps  NEW GOALS: SHORT TERM GOALS: Target date: 02/07/2022  1.  Pt will improve 5 x STS to less than or equal to 30s seconds w/BUE support and CGA to demonstrate improved functional strength and transfer efficiency.   Baseline: 01/07/2022 34.31 sec and CGA Goal status: INITIAL  2.  Pt will improve gait velocity to at least 0.8 ft/s w/LRAD for improved gait efficiency.  Baseline: 01/07/2022 0.73 ft/sec w/ RW Goal status: INITIAL  3.  Pt will increase BERG balance score to >/=34/56 to demonstrate improved static balance. Baseline: 31/56  Goal status: REVISED   4.  Pt will improve normal TUG to </= 50sec w/RW and S* for improved functional mobility and decreased fall risk. Baseline: 01/07/2022 56.75 sec w/ RW and CGA Goal status: INITIAL  5.  Pt will ambulate >400' w/ LRAD w/S* and without rest break for improved functional mobility and endurance. Baseline: 01/07/2022 244' w/ RW CGA-close S* w/ 2 standing rest breaks <10 sec Goal status:  INITIAL  LONG TERM GOALS: Target date: 03/07/2022  Pt will improve 5 x STS to less than or equal to 25 seconds w/BUE support mod I to demonstrate improved functional strength and transfer efficiency.   Baseline: 01/07/2022 34.31 sec and CGA Goal status: INITIAL  2.  Pt will improve gait velocity to at least 1.0 ft/s w/LRAD for improved gait efficiency and performance at limited household ambulator level.   Baseline: 01/07/2022 0.73 ft/sec w/ RW Goal status: INITIAL  3.  Pt will increase BERG balance score to >/=37/56 to demonstrate improved static balance. Baseline: 31/56 Goal status: REVISED   4.  Pt will improve normal TUG to less than or equal to an average of 45s w/RW and S* for improved functional mobility and decreased fall risk.  Baseline: 01/07/2022 56.75 sec w/ RW and CGA Goal status:  INITIAL  5.  Pt will ambulate >500' on level surfaces w/ LRAD w/S*  and without rest break for improved functional mobility and endurance  Baseline: 01/07/2022 244' w/ RW CGA-close S* w/ 2 standing rest breaks <10 sec Goal status: INITIAL  5.  Pt will be independent and compliant to advanced and finalized HEP to promote LE coordination and improved balance. Baseline: Needs advancement. Goal status: INITIAL  ASSESSMENT:  CLINICAL IMPRESSION: Emphasis of skilled PT session on endurance and turns w/RW. Pt fatigued very quickly when only using legs on SciFit, as pt heavily reliant on BUEs w/any activity. Pt continues to demonstrate impulsiveness with turns, resulting in poor AD and foot placement and subsequent falls. Pt requires mod cues for proper LE and RW sequencing during turns but did improve by end of session. Continued to emphasize importance of safety w/transfers, but pt will benefit from further education. Continue POC.   OBJECTIVE IMPAIRMENTS Abnormal gait, decreased activity tolerance, decreased balance, decreased cognition, decreased coordination, decreased endurance, decreased  mobility, difficulty walking, decreased safety awareness, and prosthetic dependency .   ACTIVITY LIMITATIONS carrying, lifting, bending, sitting, standing, squatting, stairs, transfers, bed mobility, reach over head, and locomotion level  PARTICIPATION LIMITATIONS: meal prep, cleaning, laundry, interpersonal relationship, driving, shopping, community activity, and yard work  PERSONAL FACTORS Age, Fitness, Past/current experiences, Time since onset of injury/illness/exacerbation, Transportation, and 1 comorbidity: L BKA are also affecting patient's functional outcome.   REHAB POTENTIAL: Fair due to history of noncompliance w/PT and decreased social support system   CLINICAL DECISION MAKING: Evolving/moderate complexity  EVALUATION COMPLEXITY: Moderate  PLAN: PT FREQUENCY: 2x/week  PT DURATION: 8 weeks  PLANNED INTERVENTIONS: Therapeutic exercises, Therapeutic activity, Neuromuscular re-education, Balance training, Gait training, Patient/Family education, Self Care, Stair training, Visual/preceptual remediation/compensation, Prosthetic training, DME instructions, Manual therapy, and Re-evaluation  PLAN FOR NEXT SESSION: Experiment w/weighted vest/adding weight to pelvis w/gait. Modify/progress HEP prn, ladder drills, dot drills, SciFit intervals for endurance, SIT <>STANDS-push up not fwd, eccentric control of quads, trial ankle wts w/ standing march vs steps in // bars, standing wide stance w/ band around thighs - banded STS seemed to provide helpful proprioceptive input, standing variable stance at countertop progressing to no UE support as able. STAND PIVOTS AND TURNS   Cruzita Lederer Finis Hendricksen, PT, DPT 01/24/2022, 2:32 PM

## 2022-01-28 ENCOUNTER — Ambulatory Visit: Payer: Medicare Other | Admitting: Physical Therapy

## 2022-01-28 ENCOUNTER — Encounter: Payer: Self-pay | Admitting: Physical Therapy

## 2022-01-28 DIAGNOSIS — R278 Other lack of coordination: Secondary | ICD-10-CM

## 2022-01-28 DIAGNOSIS — R2689 Other abnormalities of gait and mobility: Secondary | ICD-10-CM

## 2022-01-28 DIAGNOSIS — R293 Abnormal posture: Secondary | ICD-10-CM

## 2022-01-28 DIAGNOSIS — R260 Ataxic gait: Secondary | ICD-10-CM

## 2022-01-28 DIAGNOSIS — S88112A Complete traumatic amputation at level between knee and ankle, left lower leg, initial encounter: Secondary | ICD-10-CM

## 2022-01-28 DIAGNOSIS — R2681 Unsteadiness on feet: Secondary | ICD-10-CM

## 2022-01-28 DIAGNOSIS — M6281 Muscle weakness (generalized): Secondary | ICD-10-CM

## 2022-01-28 NOTE — Therapy (Signed)
OUTPATIENT PHYSICAL THERAPY TREATMENT NOTE   Patient Name: Kristopher Ruiz. MRN: [id number] DOB:Jul 16, 1950, 71 y.o., male 16 Date: 01/28/2022  PCP: Jerre Simon, MD REFERRING PROVIDER: Billey Co, MD     END OF SESSION:   PT End of Session - 01/28/22 1400     Visit Number 19    Number of Visits 33   17+16   Date for PT Re-Evaluation 03/07/22   re-certed on 01/07/2022   Authorization Type UHC Medicare/Medicaid of     Progress Note Due on Visit 20    PT Start Time 1357    PT Stop Time 1442    PT Time Calculation (min) 45 min    Equipment Utilized During Treatment Gait belt    Activity Tolerance Patient tolerated treatment well    Behavior During Therapy WFL for tasks assessed/performed               Past Medical History:  Diagnosis Date   Arthritis    Cataract    NS OU   Hypertension    Stroke [facility])    Past Surgical History:  Procedure Laterality Date   HIP FRACTURE SURGERY     LEG AMPUTATION BELOW KNEE     left leg   TOTAL HIP ARTHROPLASTY Left 10/31/2019   TOTAL HIP ARTHROPLASTY Left 10/31/2019   Procedure: LEFT TOTAL HIP ARTHROPLASTY, POSTERIOR;  Surgeon: Tarry Kos, MD;  Location: [facility];  Service: Orthopedics;  Laterality: Left;   Patient Active Problem List   Diagnosis Date Noted   Anemia 07/06/2020   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   Closed displaced fracture of left femoral neck with delayed healing 09/28/2019   Establishing care with new doctor, encounter for 06/01/2019   TIA (transient ischemic attack) 09/13/2018   Cerebellar stroke ([facility]) 09/06/2018   Hypoglycemia due to insulin 09/06/2018   Tobacco abuse 09/06/2018   Hyperlipidemia 09/06/2018   Essential hypertension 09/06/2018   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[facility]) 04/16/2017    REFERRING DIAG: I63.9 (ICD-10-CM) - Cerebellar stroke (HCC)   THERAPY DIAG:  Muscle weakness (generalized)  Other lack of coordination  Ataxic  gait  Abnormal posture  Unsteadiness on feet  Other abnormalities of gait and mobility  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H[facility])  Rationale for Evaluation and Treatment Rehabilitation  PERTINENT HISTORY: Hx of L BKA, CVA, HTN, 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  He states he is doing well despite the weather.  PAIN:  Are you having pain? No   OBJECTIVE: (objective measures completed at initial evaluation unless otherwise dated)   TODAY'S TREATMENT: Ther Ex  SciFit multi-peaks level 5 for 8 minutes using BLEs only for neural priming for reciprocal movement, dynamic cardiovascular warmup and BLE strength. RPE of 10/10 following activity.   NMR  Donned 20# weighted vest for duration of coordination drills and // bar activities. Dot drill in // bars w/ pt stepping to 3-way dots for lateral, forward and crossbody step to mimic stepping strategy and arch of turning > using crossbody dot for target and hand transitions from bars to and from chair rests to practice stand pivot transfers. Pt attempts to clear dots from platform in squatted position on edge of chair w/ therapist explicitly telling pt to not do this do to risk of falling out of seat.  Extensive edu on risk of LOB  during attempts like this in and out of the clinic.  PT had to physically move dots to discourage pt from attempting unattended. Anterior-posterior weight shift in stride progressed to mini lunge w/ RUE support Progressed from 3-way taps to random color calls to star taps to dot targets Practiced wide semi-tandem progressing to no UE support over 2 mins, more difficulty w/ RLE in rear, upon return to sitting pt performs ideal stand pivot on RLE SBA w/ RUE support  GAIT: Gait pattern: step through pattern, ataxic, trunk flexed, and narrow BOS Distance walked: 120' Assistive device utilized: Environmental consultant - 2 wheeled  and 20# weighted vest  Level of assistance: CGA and Min A Comments: Pt cued to prevent scissoring during left turns on track.  Intermittent RLE severe ataxia in swing phase w/ pt relying on UE control to prevent LOB.     PATIENT EDUCATION: Education details: Safety w/ stand pivot transfers w/ emphasis on simplified sequencing.  Continue HEP.  Person educated: Patient Education method: Medical illustrator Education comprehension: verbalized understanding and needs further education   HOME EXERCISE PROGRAM: Access Code: [email or web address] URL: [email or web address] Date: 12/31/2021 Prepared by: Alethia Berthold Plaster  Exercises - Single Leg Bridge  - 1 x daily - 5 x weekly - 2 sets - 8 reps - Sit to Stand with Resistance Around Legs  - 1 x daily - 5 x weekly - 2 sets - 10 reps - Standing March with Counter Support  - 1 x daily - 7 x weekly - 3 sets - 10 reps - Standing Hip Extension with Counter Support  - 1 x daily - 7 x weekly - 3 sets - 10 reps - Side Stepping with Resistance at Thighs and Counter Support  - 1 x daily - 7 x weekly - 3 sets - 10 reps  NEW GOALS: SHORT TERM GOALS: Target date: 02/07/2022  1.  Pt will improve 5 x STS to less than or equal to 30s seconds w/BUE support and CGA to demonstrate improved functional strength and transfer efficiency.   Baseline: 01/07/2022 34.31 sec and CGA Goal status: INITIAL  2.  Pt will improve gait velocity to at least 0.8 ft/s w/LRAD for improved gait efficiency.  Baseline: 01/07/2022 0.73 ft/sec w/ RW Goal status: INITIAL  3.  Pt will increase BERG balance score to >/=34/56 to demonstrate improved static balance. Baseline: 31/56  Goal status: REVISED   4.  Pt will improve normal TUG to </= 50sec w/RW and S* for improved functional mobility and decreased fall risk. Baseline: 01/07/2022 56.75 sec w/ RW and CGA Goal status: INITIAL  5.  Pt will ambulate >400' w/ LRAD w/S* and without rest break for improved functional  mobility and endurance. Baseline: 01/07/2022 244' w/ RW CGA-close S* w/ 2 standing rest breaks <10 sec Goal status: INITIAL  LONG TERM GOALS: Target date: 03/07/2022  Pt will improve 5 x STS to less than or equal to 25 seconds w/BUE support mod I to demonstrate improved functional strength and transfer efficiency.   Baseline: 01/07/2022 34.31 sec and CGA Goal status: INITIAL  2.  Pt will improve gait velocity to at least 1.0 ft/s w/LRAD for improved gait efficiency and performance at limited household ambulator level.   Baseline: 01/07/2022 0.73 ft/sec w/ RW Goal status: INITIAL  3.  Pt will increase BERG balance score to >/=37/56 to demonstrate improved static balance. Baseline: 31/56 Goal status: REVISED   4.  Pt will improve normal TUG to less than or equal to an  average of 45s w/RW and S* for improved functional mobility and decreased fall risk.  Baseline: 01/07/2022 56.75 sec w/ RW and CGA Goal status:  INITIAL  5.  Pt will ambulate >500' on level surfaces w/ LRAD w/S* and without rest break for improved functional mobility and endurance  Baseline: 01/07/2022 244' w/ RW CGA-close S* w/ 2 standing rest breaks <10 sec Goal status: INITIAL  5.  Pt will be independent and compliant to advanced and finalized HEP to promote LE coordination and improved balance. Baseline: Needs advancement. Goal status: INITIAL  ASSESSMENT:  CLINICAL IMPRESSION: Focus of skilled PT session today on progressing coordination tasks and continuing to work on safety with functional transfers.  Pt continues to benefit from practice addressing stand pivots and ambulation to prevent scissoring gait and strategies to manage ataxia.  He tolerates weighted vest throughout entirety of session without issues and with notable improvement in truncal ataxia during dynamic tasks.  OBJECTIVE IMPAIRMENTS Abnormal gait, decreased activity tolerance, decreased balance, decreased cognition, decreased coordination,  decreased endurance, decreased mobility, difficulty walking, decreased safety awareness, and prosthetic dependency .   ACTIVITY LIMITATIONS carrying, lifting, bending, sitting, standing, squatting, stairs, transfers, bed mobility, reach over head, and locomotion level  PARTICIPATION LIMITATIONS: meal prep, cleaning, laundry, interpersonal relationship, driving, shopping, community activity, and yard work  PERSONAL FACTORS Age, Fitness, Past/current experiences, Time since onset of injury/illness/exacerbation, Transportation, and 1 comorbidity: L BKA are also affecting patient's functional outcome.   REHAB POTENTIAL: Fair due to history of noncompliance w/PT and decreased social support system   CLINICAL DECISION MAKING: Evolving/moderate complexity  EVALUATION COMPLEXITY: Moderate  PLAN: PT FREQUENCY: 2x/week  PT DURATION: 8 weeks  PLANNED INTERVENTIONS: Therapeutic exercises, Therapeutic activity, Neuromuscular re-education, Balance training, Gait training, Patient/Family education, Self Care, Stair training, Visual/preceptual remediation/compensation, Prosthetic training, DME instructions, Manual therapy, and Re-evaluation  PLAN FOR NEXT SESSION: Experiment w/weighted vest/adding weight to pelvis w/gait. Modify/progress HEP prn, ladder drills, dot drills, SciFit intervals for endurance, SIT <>STANDS-push up not fwd, eccentric control of quads, trial ankle wts w/ standing march vs steps in // bars, standing wide stance w/ band around thighs - banded STS seemed to provide helpful proprioceptive input, standing variable stance at countertop progressing to no UE support as able. STAND PIVOTS AND TURNS   Bary Richard, PT, DPT 01/28/2022, 3:33 PM

## 2022-01-31 ENCOUNTER — Ambulatory Visit: Payer: Medicare Other | Admitting: Physical Therapy

## 2022-02-04 ENCOUNTER — Ambulatory Visit: Payer: Medicare Other | Attending: Orthopedic Surgery | Admitting: Physical Therapy

## 2022-02-04 DIAGNOSIS — R2689 Other abnormalities of gait and mobility: Secondary | ICD-10-CM | POA: Insufficient documentation

## 2022-02-04 DIAGNOSIS — M6281 Muscle weakness (generalized): Secondary | ICD-10-CM | POA: Diagnosis not present

## 2022-02-04 DIAGNOSIS — R260 Ataxic gait: Secondary | ICD-10-CM | POA: Diagnosis present

## 2022-02-04 DIAGNOSIS — R278 Other lack of coordination: Secondary | ICD-10-CM | POA: Diagnosis present

## 2022-02-04 DIAGNOSIS — R293 Abnormal posture: Secondary | ICD-10-CM | POA: Diagnosis present

## 2022-02-04 DIAGNOSIS — S88112A Complete traumatic amputation at level between knee and ankle, left lower leg, initial encounter: Secondary | ICD-10-CM | POA: Diagnosis present

## 2022-02-04 DIAGNOSIS — R2681 Unsteadiness on feet: Secondary | ICD-10-CM | POA: Insufficient documentation

## 2022-02-04 NOTE — Therapy (Signed)
OUTPATIENT PHYSICAL THERAPY TREATMENT NOTE- 20TH VISIT PROGRESS NOTE   Patient Name: Kristopher Ruiz. MRN: [id number] DOB:04/03/1950, 71 y.o., male 2 Date: 02/04/2022  PCP: Jerre Simon, MD REFERRING PROVIDER: Billey Co, MD   Physical Therapy Progress Note   Dates of Reporting Period:11/12/21 - 02/04/22  See Note below for Objective Data and Assessment of Progress/Goals.  Thank you for the referral of this patient. Alethia Berthold Yeudiel Mateo, PT, DPT     END OF SESSION:   PT End of Session - 02/04/22 1451     Visit Number 20    Number of Visits 33   17+16   Date for PT Re-Evaluation 03/07/22   re-certed on 01/07/2022   Authorization Type UHC Medicare/Medicaid of [place]    Progress Note Due on Visit 20    PT Start Time 1448    PT Stop Time 1530    PT Time Calculation (min) 42 min    Equipment Utilized During Treatment Gait belt    Activity Tolerance Patient tolerated treatment well    Behavior During Therapy WFL for tasks assessed/performed                Past Medical History:  Diagnosis Date   Arthritis    Cataract    NS OU   Hypertension    Stroke [facility])    Past Surgical History:  Procedure Laterality Date   HIP FRACTURE SURGERY     LEG AMPUTATION BELOW KNEE     left leg   TOTAL HIP ARTHROPLASTY Left 10/31/2019   TOTAL HIP ARTHROPLASTY Left 10/31/2019   Procedure: LEFT TOTAL HIP ARTHROPLASTY, POSTERIOR;  Surgeon: Tarry Kos, MD;  Location: [facility] OR;  Service: Orthopedics;  Laterality: Left;   Patient Active Problem List   Diagnosis Date Noted   Anemia 07/06/2020   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   Closed displaced fracture of left femoral neck with delayed healing 09/28/2019   Establishing care with new doctor, encounter for 06/01/2019   TIA (transient ischemic attack) 09/13/2018   Cerebellar stroke (HCC) 09/06/2018   Hypoglycemia due to insulin 09/06/2018   Tobacco abuse 09/06/2018   Hyperlipidemia 09/06/2018   Essential  hypertension 09/06/2018   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HCC) 04/16/2017    REFERRING DIAG: I63.9 (ICD-10-CM) - Cerebellar stroke (HCC)   THERAPY DIAG:  Muscle weakness (generalized)  Other lack of coordination  Unsteadiness on feet  Rationale for Evaluation and Treatment Rehabilitation  PERTINENT HISTORY: Hx of L BKA, CVA, HTN, 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 Went to the [employer] last last week and went well. No changes. Pt displaying flat affect today, denies anything bothering him.   PAIN:  Are you having pain? No   OBJECTIVE: (objective measures completed at initial evaluation unless otherwise dated)   TODAY'S TREATMENT: NMR  Mass practice of sit <> stand pivots to L and R side w/RW, x8 each direction, w/close S* throughout. Pt unable to recall proper sequence without cues and required min cues for proper sequencing during activity. Noted increased difficulty turning to L side compared to R. Pt reports he does not feel comfortable performing unless someone is with him, despite therapist not providing assistance throughout. Pt will benefit from continued practice to ensure safety at home. Of note, pt appeared very sad and quiet throughout session and had increased difficulty w/simple tasks that he typically  can perform.    Ther Act  STG Assessment   [facility] PT Assessment - 02/04/22 1512       Berg Balance Test   Sit to Stand Needs minimal aid to stand or to stabilize    Standing Unsupported Able to stand 2 minutes with supervision    Sitting with Back Unsupported but Feet Supported on Floor or Stool Able to sit safely and securely 2 minutes    Stand to Sit Controls descent by using hands    Transfers Needs one person to assist   Attempted to perform correct stand pivot but lost balance without UE support   Standing Unsupported with Eyes Closed Able to stand 3 seconds     Standing Unsupported with Feet Together Needs help to attain position and unable to hold for 15 seconds   LOB after 3 seconds           Ther Ex  SciFit multi-peaks level 7 for 8 minutes using BLEs only for dynamic cardiovascular conditioning and BLE strength. RPE of 10/10 following activity        PATIENT EDUCATION: Education details: Continue HEP and stand pivot practice, next appointment time  Person educated: Patient Education method: Customer service manager Education comprehension: verbalized understanding and needs further education   HOME EXERCISE PROGRAM: Access Code: [email or web address] URL: [email or web address] Date: 12/31/2021 Prepared by: Mickie Bail Dannica Bickham  Exercises - Single Leg Bridge  - 1 x daily - 5 x weekly - 2 sets - 8 reps - Sit to Stand with Resistance Around Legs  - 1 x daily - 5 x weekly - 2 sets - 10 reps - Standing March with Counter Support  - 1 x daily - 7 x weekly - 3 sets - 10 reps - Standing Hip Extension with Counter Support  - 1 x daily - 7 x weekly - 3 sets - 10 reps - Side Stepping with Resistance at Thighs and Counter Support  - 1 x daily - 7 x weekly - 3 sets - 10 reps  NEW GOALS: SHORT TERM GOALS: Target date: 02/07/2022  1.  Pt will improve 5 x STS to less than or equal to 30s seconds w/BUE support and CGA to demonstrate improved functional strength and transfer efficiency.   Baseline: 01/07/2022 34.31 sec and CGA Goal status: INITIAL  2.  Pt will improve gait velocity to at least 0.8 ft/s w/LRAD for improved gait efficiency.  Baseline: 01/07/2022 0.73 ft/sec w/ RW Goal status: INITIAL  3.  Pt will increase BERG balance score to >/=34/56 to demonstrate improved static balance. Baseline: 31/56  Goal status: REVISED   4.  Pt will improve normal TUG to </= 50sec w/RW and S* for improved functional mobility and decreased fall risk. Baseline: 01/07/2022 56.75 sec w/ RW and CGA Goal status: INITIAL  5.  Pt will ambulate  >400' w/ LRAD w/S* and without rest break for improved functional mobility and endurance. Baseline: 01/07/2022 244' w/ RW CGA-close S* w/ 2 standing rest breaks <10 sec Goal status: INITIAL  LONG TERM GOALS: Target date: 03/07/2022  Pt will improve 5 x STS to less than or equal to 25 seconds w/BUE support mod I to demonstrate improved functional strength and transfer efficiency.   Baseline: 01/07/2022 34.31 sec and CGA Goal status: INITIAL  2.  Pt will improve gait velocity to at least 1.0 ft/s w/LRAD for improved gait efficiency and performance at limited household ambulator level.   Baseline: 01/07/2022 0.73 ft/sec w/ RW Goal status: INITIAL  3.  Pt will increase BERG balance score to >/=37/56 to demonstrate improved static balance. Baseline: 31/56 Goal status: REVISED   4.  Pt will improve normal TUG to less than or equal to an average of 45s w/RW and S* for improved functional mobility and decreased fall risk.  Baseline: 01/07/2022 56.75 sec w/ RW and CGA Goal status:  INITIAL  5.  Pt will ambulate >500' on level surfaces w/ LRAD w/S* and without rest break for improved functional mobility and endurance  Baseline: 01/07/2022 244' w/ RW CGA-close S* w/ 2 standing rest breaks <10 sec Goal status: INITIAL  5.  Pt will be independent and compliant to advanced and finalized HEP to promote LE coordination and improved balance. Baseline: Needs advancement. Goal status: INITIAL  ASSESSMENT:  CLINICAL IMPRESSION: Emphasis of skilled PT session on stand pivot technique and initiating STG assessment. Pt appeared very sad and quiet today, which is unlike him, but denied talking to therapist about it. Molli Hazard, but pt having a very difficult time with tasks that he was able to perform last assessment and getting discouraged, so only performed part of test. Pt continues to require cues for proper sequencing w/stand pivot transfers but was able to perform without physical assistance  during session. Continue POC.   OBJECTIVE IMPAIRMENTS Abnormal gait, decreased activity tolerance, decreased balance, decreased cognition, decreased coordination, decreased endurance, decreased mobility, difficulty walking, decreased safety awareness, and prosthetic dependency .   ACTIVITY LIMITATIONS carrying, lifting, bending, sitting, standing, squatting, stairs, transfers, bed mobility, reach over head, and locomotion level  PARTICIPATION LIMITATIONS: meal prep, cleaning, laundry, interpersonal relationship, driving, shopping, community activity, and yard work  PERSONAL FACTORS Age, Fitness, Past/current experiences, Time since onset of injury/illness/exacerbation, Transportation, and 1 comorbidity: L BKA are also affecting patient's functional outcome.   REHAB POTENTIAL: Fair due to history of noncompliance w/PT and decreased social support system   CLINICAL DECISION MAKING: Evolving/moderate complexity  EVALUATION COMPLEXITY: Moderate  PLAN: PT FREQUENCY: 2x/week  PT DURATION: 8 weeks  PLANNED INTERVENTIONS: Therapeutic exercises, Therapeutic activity, Neuromuscular re-education, Balance training, Gait training, Patient/Family education, Self Care, Stair training, Visual/preceptual remediation/compensation, Prosthetic training, DME instructions, Manual therapy, and Re-evaluation  PLAN FOR NEXT SESSION: Check goals. Experiment w/weighted vest/adding weight to pelvis w/gait. Modify/progress HEP prn, ladder drills, dot drills, SciFit intervals for endurance, SIT <>STANDS-push up not fwd, eccentric control of quads, trial ankle wts w/ standing march vs steps in // bars, standing wide stance w/ band around thighs - banded STS seemed to provide helpful proprioceptive input, standing variable stance at countertop progressing to no UE support as able. STAND PIVOTS AND TURNS   Jill Alexanders Raygan Skarda, PT, DPT 02/04/2022, 3:40 PM

## 2022-02-10 ENCOUNTER — Ambulatory Visit: Payer: Medicare Other | Admitting: Student

## 2022-02-11 ENCOUNTER — Ambulatory Visit: Payer: Medicare Other | Admitting: Physical Therapy

## 2022-02-11 DIAGNOSIS — R260 Ataxic gait: Secondary | ICD-10-CM

## 2022-02-11 DIAGNOSIS — R2681 Unsteadiness on feet: Secondary | ICD-10-CM

## 2022-02-11 DIAGNOSIS — M6281 Muscle weakness (generalized): Secondary | ICD-10-CM

## 2022-02-11 DIAGNOSIS — R278 Other lack of coordination: Secondary | ICD-10-CM

## 2022-02-11 NOTE — Therapy (Signed)
OUTPATIENT PHYSICAL THERAPY TREATMENT NOTE   Patient Name: Kristopher Ruiz. MRN: [id number] DOB:02-Sep-1950, 71 y.o., male 40 Date: 02/11/2022  PCP: Alen Bleacher, MD REFERRING PROVIDER: Lenoria Chime, MD      END OF SESSION:   PT End of Session - 02/11/22 1316     Visit Number 21    Number of Visits 29   92+42   Date for PT Re-Evaluation 68/34/19   re-certed on 62/22/9798   Authorization Type UHC Medicare/Medicaid of [place]    Progress Note Due on Visit 20    PT Start Time 1314    PT Stop Time 1357    PT Time Calculation (min) 43 min    Equipment Utilized During Treatment Gait belt    Activity Tolerance Patient tolerated treatment well    Behavior During Therapy WFL for tasks assessed/performed                 Past Medical History:  Diagnosis Date   Arthritis    Cataract    NS OU   Hypertension    Stroke [facility])    Past Surgical History:  Procedure Laterality Date   HIP FRACTURE SURGERY     LEG AMPUTATION BELOW KNEE     left leg   TOTAL HIP ARTHROPLASTY Left 10/31/2019   TOTAL HIP ARTHROPLASTY Left 10/31/2019   Procedure: LEFT TOTAL HIP ARTHROPLASTY, POSTERIOR;  Surgeon: Leandrew Koyanagi, MD;  Location: [place];  Service: Orthopedics;  Laterality: Left;   Patient Active Problem List   Diagnosis Date Noted   Anemia 07/06/2020   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   Closed displaced fracture of left femoral neck with delayed healing 09/28/2019   Establishing care with new doctor, encounter for 06/01/2019   TIA (transient ischemic attack) 09/13/2018   Cerebellar stroke ([place]) 09/06/2018   Hypoglycemia due to insulin 09/06/2018   Tobacco abuse 09/06/2018   Hyperlipidemia 09/06/2018   Essential hypertension 09/06/2018   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[place]) 04/16/2017    REFERRING DIAG: I63.9 (ICD-10-CM) - Cerebellar stroke (HCC)   THERAPY DIAG:  Muscle weakness (generalized)  Other lack of coordination  Unsteadiness on  feet  Ataxic gait  Rationale for Evaluation and Treatment Rehabilitation  PERTINENT HISTORY: Hx of L BKA, CVA, HTN, 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 Pt reports no pain today and no falls.  PAIN:  Are you having pain? No   OBJECTIVE: (objective measures completed at initial evaluation unless otherwise dated)   TODAY'S TREATMENT:  Ther Act  STG Assessment   [facility] PT Assessment - 02/11/22 1322       Ambulation/Gait   Gait velocity 32.8 ft over 54.75 sec =0.6 ft/sec      Standardized Balance Assessment   Standardized Balance Assessment Berg Balance Test;Timed Up and Go Test;Five Times Sit to Stand    Five times sit to stand comments  56.93 sec   with RW and min A     Berg Balance Test   Sit to Stand Needs minimal aid to stand or to stabilize    Standing Unsupported Able to stand 2 minutes with supervision    Sitting with Back Unsupported but Feet Supported on Floor or Stool Able to sit safely and securely 2 minutes    Stand to Sit Controls descent by using hands    Transfers Needs one  person to assist    Standing Unsupported with Eyes Closed Able to stand 10 seconds with supervision    Standing Unsupported with Feet Together Needs help to attain position and unable to hold for 15 seconds    From Standing, Reach Forward with Outstretched Arm Loses balance while trying/requires external support    From Standing Position, Pick up Object from Floor Unable to try/needs assist to keep balance    From Standing Position, Turn to Look Behind Over each Shoulder Needs supervision when turning    Turn 360 Degrees Needs assistance while turning    Standing Unsupported, Alternately Place Feet on Step/Stool Needs assistance to keep from falling or unable to try    Standing Unsupported, One Foot in [place] balance while stepping or standing    Standing on One Leg Unable to try or needs  assist to prevent fall    Total Score 16    Berg comment: 16/56, high fall risk      Timed Up and Go Test   TUG Normal TUG    Normal TUG (seconds) 65            Blocked practice of stand pivot transfers chair to/from mat table with up to min A needed. Pt exhibits increased difficulty transferring to his R side due to RLE weakness. Pt exhibits overall decreased balance and control as repetitions progress due to onset of fatigue as well as decreased attention to sequence of transfer. With cues to decrease speed and focus on sequence of transfer pt exhibits improved balance and safety.   PATIENT EDUCATION: Education details: Continue HEP and Scientist, physiological, next appointment time  Person educated: Patient Education method: Customer service manager Education comprehension: verbalized understanding and needs further education   HOME EXERCISE PROGRAM: Access Code: [email or web address] URL: [email or web address] Date: 12/31/2021 Prepared by: Mickie Bail Plaster  Exercises - Single Leg Bridge  - 1 x daily - 5 x weekly - 2 sets - 8 reps - Sit to Stand with Resistance Around Legs  - 1 x daily - 5 x weekly - 2 sets - 10 reps - Standing March with Counter Support  - 1 x daily - 7 x weekly - 3 sets - 10 reps - Standing Hip Extension with Counter Support  - 1 x daily - 7 x weekly - 3 sets - 10 reps - Side Stepping with Resistance at Thighs and Counter Support  - 1 x daily - 7 x weekly - 3 sets - 10 reps   NEW GOALS: SHORT TERM GOALS: Target date: 02/07/2022  1.  Pt will improve 5 x STS to less than or equal to 30s seconds w/BUE support and CGA to demonstrate improved functional strength and transfer efficiency.   Baseline: 01/07/2022 34.31 sec and CGA, 56.93 sec (11/14) Goal status: NOT MET  2.  Pt will improve gait velocity to at least 0.8 ft/s w/LRAD for improved gait efficiency.  Baseline: 01/07/2022 0.73 ft/sec w/ RW, 0.6 ft/sec with RW (11/14) Goal status:NOT MET  3.   Pt will increase BERG balance score to >/=34/56 to demonstrate improved static balance. Baseline: 31/56, 16/56 (11/14)  Goal status: NOT MET  4.  Pt will improve normal TUG to </= 50sec w/RW and S* for improved functional mobility and decreased fall risk. Baseline: 01/07/2022 56.75 sec w/ RW and CGA, 65 sec (11/14) Goal status: NOT MET  5.  Pt will ambulate >400' w/ LRAD w/S* and without rest break for improved functional mobility and endurance. Baseline:  01/07/2022 244' w/ RW CGA-close S* w/ 2 standing rest breaks <10 sec, pt unable to ambulate at S* level on 11/14 due to balance deficits Goal status: NOT MET  LONG TERM GOALS: Target date: 03/07/2022  Pt will improve 5 x STS to less than or equal to 30 seconds w/BUE support mod I to demonstrate improved functional strength and transfer efficiency.   Baseline: 01/07/2022 34.31 sec and CGA Goal status: REVISED  2.  Pt will improve gait velocity to at least 0.8 ft/s w/LRAD for improved gait efficiency and performance at limited household ambulator level.   Baseline: 01/07/2022 0.73 ft/sec w/ RW Goal status: REVISED  3.  Pt will increase BERG balance score to >/=34/56 to demonstrate improved static balance. Baseline: 31/56 Goal status: REVISED   4.  Pt will improve normal TUG to less than or equal to an average of 50s w/RW and S* for improved functional mobility and decreased fall risk.  Baseline: 01/07/2022 56.75 sec w/ RW and CGA Goal status:  REVISED  5.  Pt will ambulate >500' on level surfaces w/ LRAD w/S* and without rest break for improved functional mobility and endurance  Baseline: 01/07/2022 244' w/ RW CGA-close S* w/ 2 standing rest breaks <10 sec Goal status: INITIAL  5.  Pt will be independent and compliant to advanced and finalized HEP to promote LE coordination and improved balance. Baseline: Needs advancement. Goal status: INITIAL  ASSESSMENT:  CLINICAL IMPRESSION: Emphasis of skilled PT session on reassessing  STG. Pt has met 0/5 STG based on assessment this session due to exhibiting decreased gait speed, increased time needed to perform 5xSTS and TUG, decreased balance based on lower score on BBS, and is unable to ambulate safely at Supervision level. Pt requires overall min A for short distance gait with RW during session due to ongoing truncal and limb ataxia and decreased safety with functional mobility. Revised LTG to reflect slow progress up to this point. However, this was also this patient's first session with this therapist so he may not have felt as secure performing gait and challenging his balance. Pt continues to benefit from skilled therapy services to address ongoing balance impairments leading to decreased overall safety and increased fall risk. Continue POC.   OBJECTIVE IMPAIRMENTS Abnormal gait, decreased activity tolerance, decreased balance, decreased cognition, decreased coordination, decreased endurance, decreased mobility, difficulty walking, decreased safety awareness, and prosthetic dependency .   ACTIVITY LIMITATIONS carrying, lifting, bending, sitting, standing, squatting, stairs, transfers, bed mobility, reach over head, and locomotion level  PARTICIPATION LIMITATIONS: meal prep, cleaning, laundry, interpersonal relationship, driving, shopping, community activity, and yard work  PERSONAL FACTORS Age, Fitness, Past/current experiences, Time since onset of injury/illness/exacerbation, Transportation, and 1 comorbidity: L BKA are also affecting patient's functional outcome.   REHAB POTENTIAL: Fair due to history of noncompliance w/PT and decreased social support system   CLINICAL DECISION MAKING: Evolving/moderate complexity  EVALUATION COMPLEXITY: Moderate  PLAN: PT FREQUENCY: 2x/week  PT DURATION: 8 weeks  PLANNED INTERVENTIONS: Therapeutic exercises, Therapeutic activity, Neuromuscular re-education, Balance training, Gait training, Patient/Family education, Self Care, Stair  training, Visual/preceptual remediation/compensation, Prosthetic training, DME instructions, Manual therapy, and Re-evaluation  PLAN FOR NEXT SESSION: Experiment w/weighted vest/adding weight to pelvis w/gait. Modify/progress HEP prn, ladder drills, dot drills, SciFit intervals for endurance, SIT <>STANDS-push up not fwd, eccentric control of quads, trial ankle wts w/ standing march vs steps in // bars, standing wide stance w/ band around thighs - banded STS seemed to provide helpful proprioceptive input, standing variable stance  at [place] progressing to no UE support as able. STAND PIVOTS AND TURNS, revise HEP (pt reports he is doing all exercises except supine bridges because they are too difficult)    Excell Seltzer, PT, DPT, CSRS 02/11/2022, 1:58 PM

## 2022-02-14 ENCOUNTER — Ambulatory Visit: Payer: Medicare Other | Admitting: Physical Therapy

## 2022-02-14 ENCOUNTER — Encounter: Payer: Self-pay | Admitting: Physical Therapy

## 2022-02-14 VITALS — BP 139/77 | HR 71

## 2022-02-14 DIAGNOSIS — R278 Other lack of coordination: Secondary | ICD-10-CM

## 2022-02-14 DIAGNOSIS — S88112A Complete traumatic amputation at level between knee and ankle, left lower leg, initial encounter: Secondary | ICD-10-CM

## 2022-02-14 DIAGNOSIS — R293 Abnormal posture: Secondary | ICD-10-CM

## 2022-02-14 DIAGNOSIS — R2689 Other abnormalities of gait and mobility: Secondary | ICD-10-CM

## 2022-02-14 DIAGNOSIS — R260 Ataxic gait: Secondary | ICD-10-CM

## 2022-02-14 DIAGNOSIS — M6281 Muscle weakness (generalized): Secondary | ICD-10-CM | POA: Diagnosis not present

## 2022-02-14 DIAGNOSIS — R2681 Unsteadiness on feet: Secondary | ICD-10-CM

## 2022-02-14 NOTE — Therapy (Signed)
OUTPATIENT PHYSICAL THERAPY TREATMENT NOTE   Patient Name: Kristopher Ruiz. MRN: [id number] DOB:1950/09/14, 71 y.o., male 16 Date: 02/14/2022  PCP: Alen Bleacher, MD REFERRING PROVIDER: Lenoria Chime, MD      END OF SESSION:   PT End of Session - 02/14/22 1407     Visit Number 22    Number of Visits 45   03+88   Date for PT Re-Evaluation 82/80/03   re-certed on 49/17/9150   Authorization Type UHC Medicare/Medicaid of [place]    Progress Note Due on Visit 20    PT Start Time 1402    PT Stop Time 1445    PT Time Calculation (min) 43 min    Equipment Utilized During Treatment Gait belt    Activity Tolerance Patient tolerated treatment well    Behavior During Therapy WFL for tasks assessed/performed                 Past Medical History:  Diagnosis Date   Arthritis    Cataract    NS OU   Hypertension    Stroke [facility])    Past Surgical History:  Procedure Laterality Date   HIP FRACTURE SURGERY     LEG AMPUTATION BELOW KNEE     left leg   TOTAL HIP ARTHROPLASTY Left 10/31/2019   TOTAL HIP ARTHROPLASTY Left 10/31/2019   Procedure: LEFT TOTAL HIP ARTHROPLASTY, POSTERIOR;  Surgeon: Leandrew Koyanagi, MD;  Location: [facility];  Service: Orthopedics;  Laterality: Left;   Patient Active Problem List   Diagnosis Date Noted   Anemia 07/06/2020   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   Closed displaced fracture of left femoral neck with delayed healing 09/28/2019   Establishing care with new doctor, encounter for 06/01/2019   TIA (transient ischemic attack) 09/13/2018   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   Hypoglycemia due to insulin 09/06/2018   Tobacco abuse 09/06/2018   Hyperlipidemia 09/06/2018   Essential hypertension 09/06/2018   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 Pt reports no pain today and no falls.  PAIN:  Are you having pain? No   OBJECTIVE: (objective measures completed at initial evaluation unless otherwise dated)   TODAY'S TREATMENT: Reviewed and revised HEP.  See below for details.  -Changed single leg bridge to regular bridge x12  -Clamshell (not added) x12 each side -STS w/ red theraband x10 w/ SBA using chair back in front of pt  -Standing march x20 at counter  -Standing SLS w/ counter support 5x10 sec each LE  -Standing hip extension x10 each LE  -Standing hamstring curls x20 alt LE -Side-stepping at counter w/ no resistance progressed to green theraband -provided green band for home.  PATIENT EDUCATION: Education details: Continue modified HEP and stand pivot practice, next appointment time. Person educated: Patient Education method: Customer service manager Education comprehension: verbalized understanding and needs further education   HOME EXERCISE PROGRAM: Access Code: [email or web address] URL: [email or web address] Date: 02/14/2022 Prepared  by: Elease Etienne  Exercises - Sit to Stand with Resistance Around Legs  - 1 x daily - 5 x weekly - 2 sets - 10 reps - Standing March with Counter Support  - 1 x daily - 7 x weekly - 3 sets - 10 reps - Standing Hip Extension with Counter Support  - 1 x daily - 7 x weekly - 3 sets - 10 reps - Side Stepping with Resistance at Thighs and Counter Support  - 1 x daily - 7 x weekly - 3 sets - 10 reps - Supine Bridge  - 1 x daily - 7 x weekly - 3 sets - 10 reps - Standing Single Leg Stance with Counter  Support  - 1 x daily - 7 x weekly - 1 sets - 5 reps - 10 seconds hold - Standing Hamstring Curl with Chair Support  - 1 x daily - 7 x weekly - 3 sets - 10 reps   NEW GOALS: SHORT TERM GOALS: Target date: 02/07/2022  1.  Pt will improve 5 x STS to less than or equal to 30s seconds w/BUE support and CGA to demonstrate improved functional strength and transfer efficiency.   Baseline: 01/07/2022 34.31 sec and CGA, 56.93 sec (11/14) Goal status: NOT MET  2.  Pt will improve gait velocity to at least 0.8 ft/s w/LRAD for improved gait efficiency.  Baseline: 01/07/2022 0.73 ft/sec w/ RW, 0.6 ft/sec with RW (11/14) Goal status:NOT MET  3.  Pt will increase BERG balance score to >/=34/56 to demonstrate improved static balance. Baseline: 31/56, 16/56 (11/14)  Goal status: NOT MET  4.  Pt will improve normal TUG to </= 50sec w/RW and S* for improved functional mobility and decreased fall risk. Baseline: 01/07/2022 56.75 sec w/ RW and CGA, 65 sec (11/14) Goal status: NOT MET  5.  Pt will ambulate >400' w/ LRAD w/S* and without rest break for improved functional mobility and endurance. Baseline: 01/07/2022 244' w/ RW CGA-close S* w/ 2 standing rest breaks <10 sec, pt unable to ambulate at S* level on 11/14 due to balance deficits Goal status: NOT MET  LONG TERM GOALS: Target date: 03/07/2022  Pt will improve 5 x STS to less than or equal to 30 seconds w/BUE support mod I to demonstrate improved functional strength and transfer efficiency.   Baseline: 01/07/2022 34.31 sec and CGA Goal status: REVISED  2.  Pt will improve gait velocity to at least 0.8 ft/s w/LRAD for improved gait efficiency and performance at limited household ambulator level.   Baseline: 01/07/2022 0.73 ft/sec w/ RW Goal status: REVISED  3.  Pt will increase BERG balance score to >/=34/56 to demonstrate improved static balance. Baseline: 31/56 Goal status: REVISED   4.  Pt will improve normal TUG to less than or equal  to an average of 50s w/RW and S* for improved functional mobility and decreased fall risk.  Baseline: 01/07/2022 56.75 sec w/ RW and CGA Goal status:  REVISED  5.  Pt will ambulate >500' on level surfaces w/ LRAD w/S* and without rest break for improved functional mobility and endurance  Baseline: 01/07/2022 244' w/ RW CGA-close S* w/ 2 standing rest breaks <10 sec Goal status: INITIAL  5.  Pt will be independent and compliant to advanced and finalized HEP to promote LE coordination and improved balance. Baseline: Needs advancement. Goal status: INITIAL  ASSESSMENT:  CLINICAL IMPRESSION: Pt standing balance appearing improved from session prior today.  He does endorse he has a lot  on his mind last session which may explain noted fluctuation.  Time spent this session reviewing and modifying HEP to meet pt's current functional level.  He tolerates all challenges well and seems back on track towards LTGs.  OBJECTIVE IMPAIRMENTS Abnormal gait, decreased activity tolerance, decreased balance, decreased cognition, decreased coordination, decreased endurance, decreased mobility, difficulty walking, decreased safety awareness, and prosthetic dependency .   ACTIVITY LIMITATIONS carrying, lifting, bending, sitting, standing, squatting, stairs, transfers, bed mobility, reach over head, and locomotion level  PARTICIPATION LIMITATIONS: meal prep, cleaning, laundry, interpersonal relationship, driving, shopping, community activity, and yard work  PERSONAL FACTORS Age, Fitness, Past/current experiences, Time since onset of injury/illness/exacerbation, Transportation, and 1 comorbidity: L BKA are also affecting patient's functional outcome.   REHAB POTENTIAL: Fair due to history of noncompliance w/PT and decreased social support system   CLINICAL DECISION MAKING: Evolving/moderate complexity  EVALUATION COMPLEXITY: Moderate  PLAN: PT FREQUENCY: 2x/week  PT DURATION: 8 weeks  PLANNED  INTERVENTIONS: Therapeutic exercises, Therapeutic activity, Neuromuscular re-education, Balance training, Gait training, Patient/Family education, Self Care, Stair training, Visual/preceptual remediation/compensation, Prosthetic training, DME instructions, Manual therapy, and Re-evaluation  PLAN FOR NEXT SESSION: Experiment w/weighted vest/adding weight to pelvis w/gait. Modify/progress HEP prn, ladder drills, dot drills, SciFit intervals for endurance, SIT <>STANDS-push up not fwd, eccentric control of quads, trial ankle wts w/ standing march vs steps in // bars, standing wide stance w/ band around thighs - banded STS seemed to provide helpful proprioceptive input, standing variable stance at countertop progressing to no UE support as able. STAND PIVOTS AND TURNS    Bary Richard, PT, DPT 02/14/2022, 3:00 PM

## 2022-02-14 NOTE — Patient Instructions (Signed)
Access Code: [email or web address] URL: [email or web address] Date: 02/14/2022 Prepared by: Camille Bal  Exercises - Sit to Stand with Resistance Around Legs  - 1 x daily - 5 x weekly - 2 sets - 10 reps - Standing March with Counter Support  - 1 x daily - 7 x weekly - 3 sets - 10 reps - Standing Hip Extension with Counter Support  - 1 x daily - 7 x weekly - 3 sets - 10 reps - Side Stepping with Resistance at Thighs and Counter Support  - 1 x daily - 7 x weekly - 3 sets - 10 reps - Supine Bridge  - 1 x daily - 7 x weekly - 3 sets - 10 reps - Standing Single Leg Stance with Counter Support  - 1 x daily - 7 x weekly - 1 sets - 5 reps - 10 seconds hold - Standing Hamstring Curl with Chair Support  - 1 x daily - 7 x weekly - 3 sets - 10 reps

## 2022-02-17 ENCOUNTER — Telehealth: Payer: Self-pay | Admitting: Pharmacist

## 2022-02-17 DIAGNOSIS — Z720 Tobacco use: Secondary | ICD-10-CM

## 2022-02-17 NOTE — Telephone Encounter (Signed)
Patient contacted for follow/up of tobacco intake reduction / cessation attempt.   Since last contact patient reports smoking only 1-2 cigs per day.  Medications currently being used;  Varenicline - 0.5mg  BID Nicotine patch - denies use  Patient denies any significant side effects from tobacco cessation therapy.   Continues to rates IMPORTANCE of quitting tobacco as high.   Rate CONFIDENCE of quitting tobacco by the end of the month as "good".  Goal to quit completely.  Advised to continue use of varenicline.   Motivation to quit: breathing   Total time with patient call and documentation of interaction: 9 minutes. Follow-up phone call planned: 2 weeks

## 2022-02-17 NOTE — Assessment & Plan Note (Signed)
Patient contacted for follow/up of tobacco intake reduction / cessation attempt.   Since last contact patient reports smoking only 1-2 cigs per day.  Medications currently being used;  Varenicline - 0.5mg  BID Nicotine patch - denies use  Patient denies any significant side effects from tobacco cessation therapy.   Continues to rates IMPORTANCE of quitting tobacco as high.   Rate CONFIDENCE of quitting tobacco by the end of the month as "good".  Goal to quit completely.  Advised to continue use of varenicline.   Motivation to quit: breathing

## 2022-02-17 NOTE — Telephone Encounter (Signed)
Noted and agree. 

## 2022-02-17 NOTE — Telephone Encounter (Signed)
-----   Message from Kathrin Ruddy, RPH-CPP sent at 01/07/2022 11:22 AM EDT ----- Regarding: Tobacco Cessatoin F/U

## 2022-02-18 ENCOUNTER — Ambulatory Visit: Payer: Medicare Other | Admitting: Physical Therapy

## 2022-02-18 DIAGNOSIS — R2681 Unsteadiness on feet: Secondary | ICD-10-CM

## 2022-02-18 DIAGNOSIS — M6281 Muscle weakness (generalized): Secondary | ICD-10-CM

## 2022-02-18 DIAGNOSIS — R278 Other lack of coordination: Secondary | ICD-10-CM

## 2022-02-18 NOTE — Therapy (Signed)
OUTPATIENT PHYSICAL THERAPY TREATMENT NOTE   Patient Name: Kristopher Ruiz. MRN: [id number] DOB:07-25-1950, 71 y.o., male 66 Date: 02/18/2022  PCP: Alen Bleacher, MD REFERRING PROVIDER: Lenoria Chime, MD      END OF SESSION:   PT End of Session - 02/18/22 1404     Visit Number 23    Number of Visits 78   58+85   Date for PT Re-Evaluation 02/77/41   re-certed on 28/78/6767   Authorization Type UHC Medicare/Medicaid of     Progress Note Due on Visit 20    PT Start Time 1403    PT Stop Time 1445    PT Time Calculation (min) 42 min    Equipment Utilized During Treatment Gait belt    Activity Tolerance Patient tolerated treatment well    Behavior During Therapy WFL for tasks assessed/performed                  Past Medical History:  Diagnosis Date   Arthritis    Cataract    NS OU   Hypertension    Stroke [facility])    Past Surgical History:  Procedure Laterality Date   HIP FRACTURE SURGERY     LEG AMPUTATION BELOW KNEE     left leg   TOTAL HIP ARTHROPLASTY Left 10/31/2019   TOTAL HIP ARTHROPLASTY Left 10/31/2019   Procedure: LEFT TOTAL HIP ARTHROPLASTY, POSTERIOR;  Surgeon: Leandrew Koyanagi, MD;  Location: [place];  Service: Orthopedics;  Laterality: Left;   Patient Active Problem List   Diagnosis Date Noted   Anemia 07/06/2020   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   Closed displaced fracture of left femoral neck with delayed healing 09/28/2019   Establishing care with new doctor, encounter for 06/01/2019   TIA (transient ischemic attack) 09/13/2018   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 Pt reports no pain today and no falls.  PAIN:  Are you having pain? No   OBJECTIVE: (objective measures completed at initial evaluation unless otherwise dated)   TODAY'S TREATMENT:  NMR  Sit <>stands from standard chair w/BUE support to review proper technique, immediate standing balance and BLE strength, x5 reps. Pt required min A to stabilize due to anterior LOB and displayed wide BOS for stability  Progressed to x5 sit <>stands on Airex w/BUE support and pt able to stabilize more independently on foam compared to level surface, requiring CGA only for stability  In corner for improved standing balance, narrow BOS and visual input: Standing w/hip-width BOS and no UE support, x2 minutes with cues to maintain gaze on stationary object to assist with balance. Noted consistent sway in clockwise pattern but no major LOB noted  Romberg stance w/cues to maintain gaze on target, 4x 15-20s. Pt required min-mod A to stabilize, frequently losing balance anterolaterally to L side. Significant difficulty maintaining narrow BOS  Standing hip-width apart performing horizontal and vertical head  turns, x2 minutes total each direction  Standing w/EC *** Tandem stance, x1 minute each direction. Increased difficulty w/RLE forward     PATIENT EDUCATION: Education details: Continue modified HEP and stand pivot practice, next appointment time. Person educated: Patient Education method: Customer service manager Education comprehension: verbalized understanding and needs further education   HOME EXERCISE PROGRAM: Access Code: [email or web address] URL: [email or web address] Date: 02/14/2022 Prepared by: Elease Etienne  Exercises - Sit to Stand  with Resistance Around Legs  - 1 x daily - 5 x weekly - 2 sets - 10 reps - Standing March with Counter Support  - 1 x daily - 7 x weekly - 3 sets - 10 reps - Standing Hip Extension with Counter Support  - 1 x daily - 7 x weekly - 3 sets - 10 reps - Side Stepping with Resistance at Thighs and Counter Support  - 1 x daily - 7 x weekly - 3 sets - 10 reps - Supine Bridge  - 1 x daily - 7 x weekly - 3 sets - 10 reps - Standing Single Leg Stance with Counter Support  - 1 x daily - 7 x weekly - 1 sets - 5 reps - 10 seconds hold - Standing Hamstring Curl with Chair Support  - 1 x daily - 7 x weekly - 3 sets - 10 reps   NEW GOALS: SHORT TERM GOALS: Target date: 02/07/2022  1.  Pt will improve 5 x STS to less than or equal to 30s seconds w/BUE support and CGA to demonstrate improved functional strength and transfer efficiency.   Baseline: 01/07/2022 34.31 sec and CGA, 56.93 sec (11/14) Goal status: NOT MET  2.  Pt will improve gait velocity to at least 0.8 ft/s w/LRAD for improved gait efficiency.  Baseline: 01/07/2022 0.73 ft/sec w/ RW, 0.6 ft/sec with RW (11/14) Goal status:NOT MET  3.  Pt will increase BERG balance score to >/=34/56 to demonstrate improved static balance. Baseline: 31/56, 16/56 (11/14)  Goal status: NOT MET  4.  Pt will improve normal TUG to </= 50sec w/RW and S* for improved functional mobility and decreased fall risk. Baseline: 01/07/2022 56.75 sec w/ RW and CGA, 65 sec (11/14) Goal status: NOT MET  5.  Pt will ambulate >400' w/ LRAD w/S* and without rest break for improved functional mobility and endurance. Baseline: 01/07/2022 244' w/ RW CGA-close S* w/ 2 standing rest breaks <10 sec, pt unable to ambulate at S* level on 11/14 due to balance deficits Goal status: NOT MET  LONG TERM GOALS: Target date: 03/07/2022  Pt will improve 5 x STS to less than or equal to 30 seconds w/BUE support mod I to demonstrate improved functional strength and transfer efficiency.    Baseline: 01/07/2022 34.31 sec and CGA Goal status: REVISED  2.  Pt will improve gait velocity to at least 0.8 ft/s w/LRAD for improved gait efficiency and performance at limited household ambulator level.   Baseline: 01/07/2022 0.73 ft/sec w/ RW Goal status: REVISED  3.  Pt will increase BERG balance score to >/=34/56 to demonstrate improved static balance. Baseline: 31/56 Goal status: REVISED   4.  Pt will improve normal TUG to less than or equal to an average of 50s w/RW and S* for improved functional mobility and decreased fall risk.  Baseline: 01/07/2022 56.75 sec w/ RW and CGA Goal status:  REVISED  5.  Pt will ambulate >500' on level surfaces w/ LRAD w/S* and without rest break for improved functional mobility and endurance  Baseline: 01/07/2022 244' w/ RW CGA-close S* w/ 2 standing rest breaks <10 sec Goal status: INITIAL  5.  Pt will be independent and compliant to advanced and finalized HEP to promote LE coordination and improved balance. Baseline: Needs advancement. Goal status: INITIAL  ASSESSMENT:  CLINICAL IMPRESSION: Pt standing balance appearing improved from session prior today.  He does endorse he has a lot on his mind last session which may explain noted fluctuation.  Time spent this session reviewing and modifying HEP to meet pt's current functional level.  He tolerates all challenges well and seems back on track towards LTGs.  OBJECTIVE IMPAIRMENTS Abnormal gait, decreased activity tolerance, decreased balance, decreased cognition, decreased coordination, decreased endurance, decreased mobility, difficulty walking, decreased safety awareness, and prosthetic dependency .   ACTIVITY LIMITATIONS carrying, lifting, bending, sitting, standing, squatting, stairs, transfers, bed mobility, reach over head, and locomotion level  PARTICIPATION LIMITATIONS: meal prep, cleaning, laundry, interpersonal relationship, driving, shopping, community activity, and yard  work  PERSONAL FACTORS Age, Fitness, Past/current experiences, Time since onset of injury/illness/exacerbation, Transportation, and 1 comorbidity: L BKA are also affecting patient's functional outcome.   REHAB POTENTIAL: Fair due to history of noncompliance w/PT and decreased social support system   CLINICAL DECISION MAKING: Evolving/moderate complexity  EVALUATION COMPLEXITY: Moderate  PLAN: PT FREQUENCY: 2x/week  PT DURATION: 8 weeks  PLANNED INTERVENTIONS: Therapeutic exercises, Therapeutic activity, Neuromuscular re-education, Balance training, Gait training, Patient/Family education, Self Care, Stair training, Visual/preceptual remediation/compensation, Prosthetic training, DME instructions, Manual therapy, and Re-evaluation  PLAN FOR NEXT SESSION: Experiment w/weighted vest/adding weight to pelvis w/gait. Modify/progress HEP prn, ladder drills, dot drills, SciFit intervals for endurance, SIT <>STANDS-push up not fwd, eccentric control of quads, trial ankle wts w/ standing march vs steps in // bars, standing wide stance w/ band around thighs - banded STS seemed to provide helpful proprioceptive input, standing variable stance at countertop progressing to no UE support as able. STAND PIVOTS AND TURNS    Cruzita Lederer Yared Susan, PT, DPT 02/18/2022, 2:45 PM

## 2022-02-25 ENCOUNTER — Ambulatory Visit: Payer: Medicare Other | Admitting: Physical Therapy

## 2022-02-28 ENCOUNTER — Ambulatory Visit: Payer: Medicare Other | Attending: Orthopedic Surgery | Admitting: Physical Therapy

## 2022-03-03 ENCOUNTER — Telehealth: Payer: Self-pay | Admitting: Pharmacist

## 2022-03-03 NOTE — Telephone Encounter (Signed)
-----   Message from Kathrin Ruddy, RPH-CPP sent at 02/17/2022  2:51 PM EST ----- Regarding: Quit with use of Varenicline Smoking 1-2 per day 11/20

## 2022-03-03 NOTE — Telephone Encounter (Signed)
Attempted to contact patient for follow-up of tobacco intake reduction / cessation.   Unable to reach at the number listed.  I will plan to follow-up in the future.   Total time with patient call and documentation of interaction: 7 minutes.  Additional follow-up phone call planned: 1 month.

## 2022-03-04 ENCOUNTER — Ambulatory Visit: Payer: Medicare Other | Admitting: Physical Therapy

## 2022-03-07 ENCOUNTER — Ambulatory Visit: Payer: Medicare Other | Admitting: Physical Therapy

## 2022-03-07 ENCOUNTER — Encounter: Payer: Self-pay | Admitting: Physical Therapy

## 2022-03-07 NOTE — Therapy (Signed)
[facility] [address] [place], [place], [address] Phone: [phone]   Fax:  [phone]  Patient Details  Name: Kristopher Ruiz. MRN: [id number] Date of Birth: 05-27-1950 Referring Provider:  No ref. provider found  Encounter Date: 03/07/2022  Pt did not show to his scheduled PT appointment this date. Called pt to remind him of next appointment time and pt reported he did not have transportation today.   Jill Alexanders Sylvia Kondracki, PT, DPT 03/07/2022, 2:33 PM  [facility] [address] [place], [place], [address] Phone: [phone]   Fax:  [phone]

## 2022-03-10 ENCOUNTER — Ambulatory Visit: Payer: Medicare Other | Admitting: Student

## 2022-03-11 ENCOUNTER — Ambulatory Visit: Payer: Medicare Other | Admitting: Physical Therapy

## 2022-03-14 ENCOUNTER — Other Ambulatory Visit: Payer: Self-pay | Admitting: Family Medicine

## 2022-03-14 ENCOUNTER — Ambulatory Visit: Payer: Medicare Other | Admitting: Physical Therapy

## 2022-03-21 ENCOUNTER — Other Ambulatory Visit: Payer: Self-pay | Admitting: Student

## 2022-03-21 ENCOUNTER — Telehealth: Payer: Self-pay | Admitting: Student

## 2022-03-21 ENCOUNTER — Ambulatory Visit: Payer: Medicare Other | Admitting: Student

## 2022-03-21 MED ORDER — AMLODIPINE BESYLATE 10 MG PO TABS: 10.0000 mg | ORAL_TABLET | Freq: Every day | ORAL | 2 refills | Status: DC

## 2022-03-21 MED ORDER — METOPROLOL SUCCINATE ER 25 MG PO TB24: 25.0000 mg | ORAL_TABLET | Freq: Every day | ORAL | 1 refills | Status: DC

## 2022-03-21 NOTE — Telephone Encounter (Signed)
Patient requesting refill of:  Name of Medication(s):  Amlodipine Besylate and Metoprolol Last date of OV:  02/15/21 Pharmacy:  same  Will route refill request to Clinic RN.  Discussed with patient policy to call pharmacy for future refills.  Also, discussed refills may take up to 48 hours to approve or deny.  Vilinda Blanks

## 2022-04-03 ENCOUNTER — Encounter: Payer: Self-pay | Admitting: Student

## 2022-04-03 ENCOUNTER — Ambulatory Visit (INDEPENDENT_AMBULATORY_CARE_PROVIDER_SITE_OTHER): Payer: Medicare Other | Admitting: Student

## 2022-04-03 VITALS — BP 134/60 | HR 99 | Ht 63.0 in | Wt 151.0 lb

## 2022-04-03 DIAGNOSIS — Z720 Tobacco use: Secondary | ICD-10-CM | POA: Diagnosis not present

## 2022-04-03 DIAGNOSIS — I639 Cerebral infarction, unspecified: Secondary | ICD-10-CM | POA: Diagnosis not present

## 2022-04-03 DIAGNOSIS — N529 Male erectile dysfunction, unspecified: Secondary | ICD-10-CM | POA: Diagnosis not present

## 2022-04-03 MED ORDER — TADALAFIL 10 MG PO TABS: 10.0000 mg | ORAL_TABLET | ORAL | 0 refills | Status: AC | PRN

## 2022-04-03 MED ORDER — VARENICLINE TARTRATE 1 MG PO TABS: 1.0000 mg | ORAL_TABLET | Freq: Two times a day (BID) | ORAL | 3 refills | Status: AC

## 2022-04-03 NOTE — Assessment & Plan Note (Signed)
Patient with history of cerebral stroke still has residual extremity weakness, issues with balance and continued tremors. -Placed referral for neural wrap to continue physical therapy -Placed referral to neurologist to further evaluate his tremors.

## 2022-04-03 NOTE — Progress Notes (Signed)
    SUBJECTIVE:   CHIEF COMPLAINT / HPI:   Patient is a 72 year old male presenting today for medication refill. At last visit with pharmacy patient was started on Chantix and nicotine gum for smoking cessation He is currently on chantix 0.5mg  BID As compliance and good tolerance to medication. He has reduced smoking but hasn't completely stopped. Now he's smolking 1 cigareth daily. Previously smoked about 3 a day.   CVA Patient with history of CVA back in 2020 reports still having tremors and extremity weakness He has worked with neurorehab in the past and has completed the visit He will like to continue his physical therapy with the neurorehab to improve his gait as he still having balance issues. Once recommendations for his continued tremor since his stroke.  Erectile dysfunction Patient's expressed concerns due to difficulty with arousal or staying aroused. He reports this has affected his sex life with his wife. Denies any cardiac history or chest pains No urinary incontinence, saddle paresthesia or lower back pain.  PERTINENT  PMH / PSH: Reviewed   OBJECTIVE:   BP 134/60   Pulse 99   Ht 5\' 3"  (1.6 m)   Wt 151 lb (68.5 kg)   SpO2 100%   BMI 26.75 kg/m    Physical Exam General: Alert, visible tremors, NAD Cardiovascular: RRR, No Murmurs, Normal S2/S2 Respiratory: CTAB, No wheezing or Rales Abdomen: No distension or tenderness Extremities: LLE Prosthetics Neuro: Notable tremors worse with purposeful movement but present at rest, Dyarthric speech present. 5/5 muscle strength on all extremities. Skin: Warm and dry  ASSESSMENT/PLAN:   Cerebellar stroke (HCC) Patient with history of cerebral stroke still has residual extremity weakness, issues with balance and continued tremors. -Placed referral for neural wrap to continue physical therapy -Placed referral to neurologist to further evaluate his tremors.  Tobacco abuse Patient with longstanding history of tobacco  use currently on Chantix 0.5 mg twice daily endorses compliance and tolerance to the medication. -Increased his Chantix to 1 mg twice daily -Advised patient if he is still taking the nicotine gum he can take it during cravings but no more than 15 a day.  Erectile dysfunction Patient expresses difficulty with arousal. No cardiac history.  -Will start him on Cialis.  -Reviewed side effect and return cautions with patient which he verbalized understanding.    Alen Bleacher, MD [place]

## 2022-04-03 NOTE — Assessment & Plan Note (Signed)
Patient expresses difficulty with arousal. No cardiac history.  -Will start him on Cialis.  -Reviewed side effect and return cautions with patient which he verbalized understanding.

## 2022-04-03 NOTE — Assessment & Plan Note (Signed)
Patient with longstanding history of tobacco use currently on Chantix 0.5 mg twice daily endorses compliance and tolerance to the medication. -Increased his Chantix to 1 mg twice daily -Advised patient if he is still taking the nicotine gum he can take it during cravings but no more than 15 a day.

## 2022-04-03 NOTE — Patient Instructions (Signed)
It was wonderful to meet you today. Thank you for allowing me to be a part of your care. Below is a short summary of what we discussed at your visit today:  I have increased your Chantix to 1 mg twice daily.    You can take the nicotine gum throughout the day when you have cravings but do not take more than 15  a day.  I have placed a referral to see a neurologist and also for physical therapy as well for your stroke.  You should receive a call to schedule appointment with your neurologist.  I have placed a referral for Cialis.    Please bring all of your medications to every appointment!  If you have any questions or concerns, please do not hesitate to contact us via phone or MyChart message.   Alen Bleacher, MD [facility]

## 2022-04-07 ENCOUNTER — Telehealth: Payer: Self-pay | Admitting: Pharmacist

## 2022-04-07 DIAGNOSIS — Z720 Tobacco use: Secondary | ICD-10-CM

## 2022-04-07 NOTE — Telephone Encounter (Signed)
Patient contacted for follow/up of tobacco intake reduction / cessation attempt.   Since last contact patient reports continued smoking of ~ 1 per day.   Medications currently being used;  Varenicline - 1mg  BID - recently  Also reports using.  Nicotine Gum - PRN craving  Patient denies any significant side effects from tobacco cessation therapy.   Most common triggers to use tobacco include; habit.  Discussed how cravings should be minimal with use of varenicline given low intake of nicotine from cigarettes.     Motivation to quit: health/ breathing  Total time with patient call and documentation of interaction: 9 minutes. Follow-up phone call planned: 3-4 weeks

## 2022-04-07 NOTE — Telephone Encounter (Signed)
-----   Message from Leavy Cella, [place] sent at 03/03/2022  3:25 PM EST ----- Regarding: Tobacco cessation - varenicline Unable to reach (phone cannot complete as dialed).  Plan to attempt f/u in 1 month

## 2022-04-07 NOTE — Assessment & Plan Note (Addendum)
Patient contacted for follow/up of tobacco intake reduction / cessation attempt.   Since last contact patient reports no smoking.  He reports doing well with minimal craving to smoke while using varenicline.   Medications currently being used;  Varenicline - 1mg  BID (has ~ 50 tablets in his possession) Additionally has "varenicline - white pills remaining"  Patient denies any significant side effects from tobacco cessation therapy.   Continues to rates IMPORTANCE of quitting tobacco as high.  Continues to rate CONFIDENCE of quitting tobacco as high.   Most common triggers to use tobacco include; None - minimal, states that he just does not think about smoking any more.  States breathing has improved slightly and he notices less cough, and phlegm production.    Motivation to quit: health, breathing Provided information on 1 800-QUIT NOW support program.  Patient is participating in a Managed Medicaid Plan:  Yes  Total time with patient call and documentation of interaction: 14 minutes. Follow-up phone call planned: 3 weeks - consider taper from 1mg  to 0.5mg  tablet that he has in hand.   Patient was thankful for call.  He plans to schedule follow-up with PCP in near future.  Most common triggers to use tobacco include; habit.  Discussed how cravings should be minimal with use of varenicline given low intake of nicotine from cigarettes.

## 2022-04-11 ENCOUNTER — Ambulatory Visit: Payer: 59 | Attending: Orthopedic Surgery | Admitting: Physical Therapy

## 2022-04-11 DIAGNOSIS — R2689 Other abnormalities of gait and mobility: Secondary | ICD-10-CM | POA: Insufficient documentation

## 2022-04-11 DIAGNOSIS — M6281 Muscle weakness (generalized): Secondary | ICD-10-CM | POA: Diagnosis present

## 2022-04-11 DIAGNOSIS — R293 Abnormal posture: Secondary | ICD-10-CM | POA: Insufficient documentation

## 2022-04-11 DIAGNOSIS — S88112A Complete traumatic amputation at level between knee and ankle, left lower leg, initial encounter: Secondary | ICD-10-CM | POA: Insufficient documentation

## 2022-04-11 DIAGNOSIS — R2681 Unsteadiness on feet: Secondary | ICD-10-CM | POA: Diagnosis present

## 2022-04-11 DIAGNOSIS — R260 Ataxic gait: Secondary | ICD-10-CM | POA: Insufficient documentation

## 2022-04-11 DIAGNOSIS — R278 Other lack of coordination: Secondary | ICD-10-CM | POA: Diagnosis present

## 2022-04-11 NOTE — Therapy (Signed)
OUTPATIENT PHYSICAL THERAPY NEURO EVALUATION   Patient Name: Kristopher Ruiz. MRN: [id number] DOB:1950/09/02, 72 y.o., male 86 Date: 04/11/2022   PCP: Alen Bleacher, MD REFERRING PROVIDER: Martyn Malay, MD  END OF SESSION:  PT End of Session - 04/11/22 0930     Visit Number 1    Number of Visits 17   Plus eval   Date for PT Re-Evaluation 06/20/22   Due to delay in scheduling   Authorization Type UHC Medicare/Medicaid of [place]    Progress Note Due on Visit 10    PT Start Time 0928    PT Stop Time 1004   Eval   PT Time Calculation (min) 36 min    Equipment Utilized During Treatment Gait belt    Activity Tolerance Patient tolerated treatment well    Behavior During Therapy [facility] for tasks assessed/performed;Impulsive             Past Medical History:  Diagnosis Date   Arthritis    Cataract    NS OU   Hypertension    Stroke [facility])    Past Surgical History:  Procedure Laterality Date   HIP FRACTURE SURGERY     LEG AMPUTATION BELOW KNEE     left leg   TOTAL HIP ARTHROPLASTY Left 10/31/2019   TOTAL HIP ARTHROPLASTY Left 10/31/2019   Procedure: LEFT TOTAL HIP ARTHROPLASTY, POSTERIOR;  Surgeon: Leandrew Koyanagi, MD;  Location: [place];  Service: Orthopedics;  Laterality: Left;   Patient Active Problem List   Diagnosis Date Noted   Erectile dysfunction 04/03/2022   Anemia 07/06/2020   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   Closed displaced fracture of left femoral neck with delayed healing 09/28/2019   Establishing care with new doctor, encounter for 06/01/2019   TIA (transient ischemic attack) 09/13/2018   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   Hypoglycemia due to insulin 09/06/2018   Tobacco abuse 09/06/2018   Hyperlipidemia 09/06/2018   Essential hypertension 09/06/2018   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 Pt  attempted to scoot himself into clinic and almost fell off walker as it has no backrest. Pt reports his roommate, Iona Beard, had a stroke and is no longer able to drive him to appointments or assist at home. Pt no longer using the WC, only his rollator and RW. No falls. Is requesting a new HEP. States his nurse brought him here today and is on a waitlist for ALF, has been for a while. Still going to the [facility].   Pt accompanied by: self and Brooke, care coordinator  PERTINENT HISTORY: Hx of L BKA, CVA, HTN   PAIN:  Are you having pain? No  PRECAUTIONS: Fall and Other: L BKA  WEIGHT BEARING RESTRICTIONS: No  FALLS: Has patient fallen in last 6 months? No  LIVING ENVIRONMENT: Lives with:  Iona Beard Lives in: House/apartment Stairs: Yes: External: 3 steps; on right going up Has following equipment at home: Gilford Rile - 2 wheeled, Environmental consultant - 4 wheeled, Wheelchair (manual), shower chair, and Grab bars  PLOF: Requires assistive device for independence, Needs assistance with ADLs, Needs assistance with homemaking, and pt reports he is now cooking small meals and "moving"   PATIENT GOALS: "My balance"  OBJECTIVE:   COGNITION: Overall cognitive status: Within functional limits for tasks assessed   SENSATION: Pt denies numbness/tingling  COORDINATION: Severe truncal and limb ataxia  POSTURE: rounded shoulders, forward head, and posterior pelvic tilt   BED MOBILITY:  Independent per pt  TRANSFERS: Assistive device utilized:  Environmental consultant - 4 wheeled  Sit to stand: Min A Stand to sit: Min A Pt relies heavily on posterior bracing against chair to stabilize    GAIT: Gait pattern: step to pattern, decreased stride length, decreased hip/knee flexion- Right, decreased hip/knee flexion- Left, ataxic, lateral hip instability, trunk flexed, narrow BOS, poor foot clearance- Right, and poor foot clearance- Left Distance walked: various clinic distances  Assistive device utilized: Walker - 4 wheeled Level of assistance: Mod A Comments: Pt very unsafe w/rollator and has to focis on controlling AD w/BUEs that he is unable to focus on taking proper steps. Mod A for AD management and anterior LOB correction. Strongly encouraged pt to use WC and RW rather than rollator as it is extremely unsafe at this time.   FUNCTIONAL TESTS:   [facility] PT Assessment - 04/11/22 0950       Transfers   Five time sit to stand comments  44.91s w/BUE support   min A for anterior LOB     Ambulation/Gait   Gait velocity 32.8' over 41.57s = 0.78 ft/s with rollator   Mod A due to safety concerns              TODAY'S TREATMENT:                                                                                                                               Next Session    PATIENT EDUCATION: Education details: Eval findings, POC, safety  concerns w/rollator  Person educated: Patient Education method: Explanation, Demonstration, Tactile cues, and Verbal cues Education comprehension: verbalized understanding and needs further education  HOME EXERCISE PROGRAM: Provided in previous bout of therapy- need to review Access Code: [email or web address] URL: [email or web address] Date: 02/14/2022 Prepared by: Elease Etienne   Exercises - Sit to Stand with Resistance Around Legs  - 1 x daily - 5 x weekly - 2 sets - 10 reps - Standing March with Counter Support  - 1 x daily - 7 x weekly - 3 sets - 10 reps - Standing Hip Extension with Counter Support   - 1 x daily - 7 x weekly - 3 sets - 10 reps - Side Stepping with Resistance at Thighs and Counter Support  - 1 x daily - 7 x weekly - 3 sets - 10 reps - Supine Bridge  - 1 x daily - 7 x weekly - 3 sets - 10 reps - Standing Single Leg Stance with Counter Support  - 1 x daily - 7 x weekly - 1 sets - 5 reps - 10 seconds hold - Standing Hamstring Curl with Chair Support  - 1 x daily - 7 x weekly - 3 sets - 10 reps  GOALS: Goals reviewed with patient? Yes  SHORT TERM GOALS: Target date: 05/09/2022   Pt will improve gait velocity to at least 1.0 ft/s w/LRAD and min A for improved gait efficiency and safety  Baseline: 0.78 ft/s with rollator and mod A Goal status: INITIAL  2.  Pt will improve 5 x STS to less than or equal to 38 seconds w/BUE support to demonstrate improved functional strength and transfer efficiency.   Baseline: 44.91s w/BUE support and significant posterior bracing  Goal status: INITIAL  3.  Stairs to be assessed and STG/LTG written Baseline:  Goal status: INITIAL  4.  TUG to be assessed and STG/LTG written Baseline:  Goal status: INITIAL   LONG TERM GOALS: Target date: 06/06/2022   Stairs goal  Baseline:  Goal status: INITIAL  2.  TUG goal Baseline:  Goal status: INITIAL  3.  Pt will improve 5 x STS to less than or equal to 33 seconds w/BUE support to demonstrate improved functional strength and transfer efficiency.   Baseline: 44.91s w/BUE support and significant posterior bracing  Goal status: INITIAL  4.  Pt will improve gait velocity to at least 1.3 ft/s w/LRAD and CGA for improved gait efficiency and safety  Baseline: 0.78 ft/s with rollator and mod A Goal status: INITIAL  5.  Pt will be independent with final HEP for improved transfers, gait and strength Baseline:  Goal status: INITIAL   ASSESSMENT:  CLINICAL IMPRESSION: Patient is a 72 year old male referred to Neuro OPPT for cerebellar stroke. 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 The following deficits were present during the exam: decreased strength, impaired safety awareness, severe truncal and limb ataxia and decreased activity tolerance. Based on 5x STS, gait speed and fall history, pt is an incr risk for falls. Pt would benefit from skilled PT to address these impairments and functional limitations to maximize functional mobility independence.   OBJECTIVE IMPAIRMENTS: Abnormal gait, decreased activity tolerance, decreased balance, decreased coordination, decreased knowledge of use of DME, difficulty walking, decreased strength, decreased safety awareness, impaired perceived functional ability, impaired vision/preception, improper body mechanics, prosthetic dependency , and pain  ACTIVITY LIMITATIONS: carrying, lifting, bending, standing, squatting, stairs, transfers, locomotion level, and caring  for others  PARTICIPATION LIMITATIONS: meal prep, cleaning, laundry, interpersonal relationship, driving, shopping, community activity, occupation, and yard work  PERSONAL FACTORS: Age, Behavior pattern, Fitness, Past/current experiences, Time since onset of injury/illness/exacerbation, Transportation, and 1-2 comorbidities: L BKA and severe truncal and limb ataxia  are also affecting patient's functional outcome.   REHAB POTENTIAL: Fair due to time since onset of CVA, lack of support at home, severity of ataxia and impulsive behavior  CLINICAL DECISION MAKING: Evolving/moderate complexity  EVALUATION COMPLEXITY: Moderate  PLAN:  PT FREQUENCY: 2x/week  PT DURATION: 8 weeks  PLANNED INTERVENTIONS: Therapeutic exercises, Therapeutic activity, Neuromuscular re-education, Balance training, Gait training, Patient/Family education, Self Care, Joint mobilization, Stair training, Vestibular training, Canalith repositioning, Prosthetic training, DME instructions, Wheelchair mobility training, Manual therapy, and Re-evaluation  PLAN FOR NEXT  SESSION: TUG and stairs and update goals, review and update HEP   Vonzella Althaus E Kamden Stanislaw, PT, DPT 04/11/2022, 10:10 AM

## 2022-04-14 NOTE — Telephone Encounter (Signed)
Patient calls nurse line regarding questions with Varenicline. He would like to speak with Dr. Valentina Lucks regarding how he should continue taking medication.   Please return call to patient at [phone].   Talbot Grumbling, RN

## 2022-04-15 ENCOUNTER — Ambulatory Visit: Payer: 59 | Admitting: Physical Therapy

## 2022-04-15 NOTE — Telephone Encounter (Signed)
Patient contacted for follow/up of tobacco intake reduction / cessation attempt.   Since last contact patient reports no smoking.  He reports doing well with minimal craving to smoke while using varenicline.   Medications currently being used;  Varenicline - 1mg  BID (has ~ 50 tablets in his possession) Additionally has "varenicline - white pills remaining"  Patient denies any significant side effects from tobacco cessation therapy.   Continues to rates IMPORTANCE of quitting tobacco as high.  Continues to rate CONFIDENCE of quitting tobacco as high.   Most common triggers to use tobacco include; None - minimal, states that he just does not think about smoking any more.  States breathing has improved slightly and he notices less cough, and phlegm production.    Motivation to quit: health, breathing Provided information on 1 800-QUIT NOW support program.  Patient is participating in a Managed Medicaid Plan:  Yes  Total time with patient call and documentation of interaction: 14 minutes. Follow-up phone call planned: 3 weeks - consider taper from 1mg  to 0.5mg  tablet that he has in hand.   Patient was thankful for call.  He plans to schedule follow-up with PCP in near future.

## 2022-04-16 NOTE — Telephone Encounter (Signed)
Reviewed and agree with Dr Koval's plan.   

## 2022-04-18 ENCOUNTER — Ambulatory Visit: Payer: 59 | Admitting: Physical Therapy

## 2022-04-22 ENCOUNTER — Ambulatory Visit: Payer: 59 | Admitting: Physical Therapy

## 2022-04-22 ENCOUNTER — Encounter: Payer: Self-pay | Admitting: Physical Therapy

## 2022-04-22 VITALS — BP 135/74 | HR 78

## 2022-04-22 DIAGNOSIS — R2689 Other abnormalities of gait and mobility: Secondary | ICD-10-CM

## 2022-04-22 DIAGNOSIS — R260 Ataxic gait: Secondary | ICD-10-CM

## 2022-04-22 DIAGNOSIS — R278 Other lack of coordination: Secondary | ICD-10-CM

## 2022-04-22 DIAGNOSIS — M6281 Muscle weakness (generalized): Secondary | ICD-10-CM | POA: Diagnosis not present

## 2022-04-22 DIAGNOSIS — R2681 Unsteadiness on feet: Secondary | ICD-10-CM

## 2022-04-22 DIAGNOSIS — R293 Abnormal posture: Secondary | ICD-10-CM

## 2022-04-22 DIAGNOSIS — S88112A Complete traumatic amputation at level between knee and ankle, left lower leg, initial encounter: Secondary | ICD-10-CM

## 2022-04-22 NOTE — Therapy (Signed)
OUTPATIENT PHYSICAL THERAPY NEURO TREATMENT   Patient Name: Kristopher Ruiz. MRN: [id number] DOB:Jul 15, 1950, 72 y.o., male 42 Date: 04/22/2022   PCP: Alen Bleacher, MD REFERRING PROVIDER: Martyn Malay, MD  END OF SESSION:  PT End of Session - 04/22/22 0935     Visit Number 2    Number of Visits 17    Date for PT Re-Evaluation 06/20/22    Authorization Type UHC Medicare/Medicaid of [place]    PT Start Time 0932    PT Stop Time 1015    PT Time Calculation (min) 43 min    Equipment Utilized During Treatment Gait belt    Activity Tolerance Patient tolerated treatment well    Behavior During Therapy [facility] for tasks assessed/performed;Impulsive             Past Medical History:  Diagnosis Date   Arthritis    Cataract    NS OU   Hypertension    Stroke [facility])    Past Surgical History:  Procedure Laterality Date   HIP FRACTURE SURGERY     LEG AMPUTATION BELOW KNEE     left leg   TOTAL HIP ARTHROPLASTY Left 10/31/2019   TOTAL HIP ARTHROPLASTY Left 10/31/2019   Procedure: LEFT TOTAL HIP ARTHROPLASTY, POSTERIOR;  Surgeon: Leandrew Koyanagi, MD;  Location: [place];  Service: Orthopedics;  Laterality: Left;   Patient Active Problem List   Diagnosis Date Noted   Erectile dysfunction 04/03/2022   Anemia 07/06/2020   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   Closed displaced fracture of left femoral neck with delayed healing 09/28/2019   Establishing care with new doctor, encounter for 06/01/2019   TIA (transient ischemic attack) 09/13/2018   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   Hypoglycemia due to insulin 09/06/2018   Tobacco abuse 09/06/2018   Hyperlipidemia 09/06/2018   Essential hypertension 09/06/2018   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[employer]) 04/16/2017    ONSET DATE: 04/03/2022 (referral)  REFERRING DIAG: I63.9 (ICD-10-CM) - Cerebellar stroke ([place])  THERAPY DIAG:  Muscle weakness (generalized)  Other abnormalities of gait and  mobility  Unsteadiness on feet  Ataxic gait  Other lack of coordination  Abnormal posture  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[place])  Rationale for Evaluation and Treatment: Rehabilitation  SUBJECTIVE:                                                                                                                                                                                             SUBJECTIVE STATEMENT: Pt arrives to session in [facility]; he states it was easier to use the [facility] than [facility]. [place]  is broken without breaks and though been addressed by therapy in prior visits; patient still non compliant. He reports no falls since last session. States he is doing well today.   Pt accompanied by: self  PERTINENT HISTORY: Hx of L BKA, CVA, HTN   PAIN:  Are you having pain? No  PRECAUTIONS: Fall and Other: L BKA  WEIGHT BEARING RESTRICTIONS: No  FALLS: Has patient fallen in last 6 months? No  LIVING ENVIRONMENT: Lives with:  [name] Lives in: House/apartment Stairs: Yes: External: 3 steps; on right going up Has following equipment at home: Dan Humphreys - 2 wheeled, Environmental consultant - 4 wheeled, Wheelchair (manual), shower chair, and Grab bars  PLOF: Requires assistive device for independence, Needs assistance with ADLs, Needs assistance with homemaking, and pt reports he is now cooking small meals and "moving"   PATIENT GOALS: "My balance"  OBJECTIVE:   TODAY'S TREATMENT:     TherAct:          [facility] PT Assessment - 04/22/22 0001       Timed Up and Go Test   TUG Normal TUG    Normal TUG (seconds) 46.7   with 2WW + CGA                                                                          Stairs: Patient ascends stairs (minA) with multiple overshoot/undershoot of LE (poor foot placement), notably unsteady, mix of step to and step through on ascent and step to on descent; turns with both hands on R railing as set up on home for ascent, and both hands on railing on left for  descent  Therex:   Review HEP: - Sit to Stand x 10 with use of UE to come to stand and trialed eccentric lower without UE assist (poor control and requires minA from therapist for safety; able to complete safely with use of UE so recommended this for HEP) - Standing March with Counter Support  - 2 sets - 10 reps - Standing Hip Extension with Counter Support  - 2 sets - 10 reps - Side Stepping with Resistance at [employer] and Counter Support  - 3 sets - 10 reps - Standing Single Leg Stance with Counter Support  - 3 sets - 10 seconds hold - Standing Hamstring Curl with Chair Support  - 1 set - 10 reps   PATIENT EDUCATION: Education details: safety concerns with MWC, HEP, TUG/stair findings Person educated: Patient Education method: Explanation, Demonstration, Tactile cues, and Verbal cues Education comprehension: verbalized understanding and needs further education  HOME EXERCISE PROGRAM: Access Code: [email or web address] URL: [email or web address] Date: 02/14/2022 Prepared by: Camille Bal   Exercises - Sit to Stand with Resistance Around Legs  - 1 x daily - 5 x weekly - 2 sets - 10 reps - Standing March with Counter Support  - 1 x daily - 7 x weekly - 3 sets - 10 reps - Standing Hip Extension with Counter Support  - 1 x daily - 7 x weekly - 3 sets - 10 reps - Side Stepping with Resistance at Thighs and Counter Support  - 1 x daily - 7 x weekly - 3 sets - 10 reps - Supine Bridge  - 1 x  daily - 7 x weekly - 3 sets - 10 reps - Standing Single Leg Stance with Counter Support  - 1 x daily - 7 x weekly - 1 sets - 5 reps - 10 seconds hold - Standing Hamstring Curl with Chair Support  - 1 x daily - 7 x weekly - 3 sets - 10 reps  GOALS: Goals reviewed with patient? Yes  SHORT TERM GOALS: Target date: 05/09/2022   Pt will improve gait velocity to at least 1.0 ft/s w/LRAD and min A for improved gait efficiency and safety  Baseline: 0.78 ft/s with rollator and mod A Goal status:  INITIAL  2.  Pt will improve 5 x STS to less than or equal to 38 seconds w/BUE support to demonstrate improved functional strength and transfer efficiency.   Baseline: 44.91s w/BUE support and significant posterior bracing  Goal status: INITIAL  3.  Patient will ascend/descend 4 stairs with CGA with step to gait pattern with fair foot placement and bilateral UE support on single railing for safe entrance into home. Baseline: poor foot placement/UE use and minA Goal status: INITIAL  4.  Patient will improve TUG score to 40" with LRAD to indicate progress towards a decreased risk of falls and demonstrate improved overall mobility.   Baseline: 46.7" with 2WW + CGA Goal status: INITIAL   LONG TERM GOALS: Target date: 06/06/2022   Patient will ascend/descend 4 stairs with SBA with step to gait pattern with good foot placement and bilateral UE support on single railing for safe entrance into home. Baseline: poor foot placement/UE use and minA Goal status: INITIAL  2.  Patient will improve TUG score to 35" with LRAD to indicate progress towards decreased risk of falls and demonstrate improved overall mobility.   Baseline: 46.7" with 2WW + CGA Goal status: INITIAL  3.  Pt will improve 5 x STS to less than or equal to 33 seconds w/BUE support to demonstrate improved functional strength and transfer efficiency.   Baseline: 44.91s w/BUE support and significant posterior bracing  Goal status: INITIAL  4.  Pt will improve gait velocity to at least 1.3 ft/s w/LRAD and CGA for improved gait efficiency and safety  Baseline: 0.78 ft/s with rollator and mod A Goal status: INITIAL  5.  Pt will be independent with final HEP for improved transfers, gait and strength Baseline:  Goal status: INITIAL   ASSESSMENT:  CLINICAL IMPRESSION: Session emphasized assessment of TUG and stairs as well as review of HEP from prior therapy sessions. Exercises still appropriate and provided udpated HEP printout.  Poor eccentric control and safety awareness without UE use so educated patient on use of UE use at home at this time for modification. Patient is at a high risk for falls as indicated by TUG score and is unsafe with stair navigation. Will benefit from continued work in these areas to improve safety. Pt would benefit from skilled PT to address these impairments and functional limitations to maximize functional mobility independence.   OBJECTIVE IMPAIRMENTS: Abnormal gait, decreased activity tolerance, decreased balance, decreased coordination, decreased knowledge of use of DME, difficulty walking, decreased strength, decreased safety awareness, impaired perceived functional ability, impaired vision/preception, improper body mechanics, prosthetic dependency , and pain  ACTIVITY LIMITATIONS: carrying, lifting, bending, standing, squatting, stairs, transfers, locomotion level, and caring for others  PARTICIPATION LIMITATIONS: meal prep, cleaning, laundry, interpersonal relationship, driving, shopping, community activity, occupation, and yard work  PERSONAL FACTORS: Age, Behavior pattern, Fitness, Past/current experiences, Time since onset of injury/illness/exacerbation, Transportation,  and 1-2 comorbidities: L BKA and severe truncal and limb ataxia  are also affecting patient's functional outcome.   REHAB POTENTIAL: Fair due to time since onset of CVA, lack of support at home, severity of ataxia and impulsive behavior  CLINICAL DECISION MAKING: Evolving/moderate complexity  EVALUATION COMPLEXITY: Moderate  PLAN:  PT FREQUENCY: 2x/week  PT DURATION: 8 weeks  PLANNED INTERVENTIONS: Therapeutic exercises, Therapeutic activity, Neuromuscular re-education, Balance training, Gait training, Patient/Family education, Self Care, Joint mobilization, Stair training, Vestibular training, Canalith repositioning, Prosthetic training, DME instructions, Wheelchair mobility training, Manual therapy, and  Re-evaluation  PLAN FOR NEXT SESSION: update HEP as indicated, eccentric lower sit to stand from elevated surface, education on equipment safety, functional transfers to and from various surfaces, safety on stairs  Carmelia Bake, PT, DPT 04/22/2022, 12:51 PM

## 2022-04-25 ENCOUNTER — Ambulatory Visit: Payer: 59 | Admitting: Physical Therapy

## 2022-04-25 DIAGNOSIS — M6281 Muscle weakness (generalized): Secondary | ICD-10-CM

## 2022-04-25 DIAGNOSIS — R2681 Unsteadiness on feet: Secondary | ICD-10-CM

## 2022-04-25 DIAGNOSIS — R2689 Other abnormalities of gait and mobility: Secondary | ICD-10-CM

## 2022-04-25 DIAGNOSIS — R260 Ataxic gait: Secondary | ICD-10-CM

## 2022-04-25 NOTE — Therapy (Signed)
OUTPATIENT PHYSICAL THERAPY NEURO TREATMENT   Patient Name: Kristopher Ruiz. MRN: [id number] DOB:22-Nov-1950, 72 y.o., male 69 Date: 04/25/2022   PCP: Jerre Simon, MD REFERRING PROVIDER: Westley Chandler, MD  END OF SESSION:  PT End of Session - 04/25/22 0923     Visit Number 3    Number of Visits 17    Date for PT Re-Evaluation 06/20/22    Authorization Type UHC Medicare/Medicaid of [place]    PT Start Time 0920    PT Stop Time 1007    PT Time Calculation (min) 47 min    Equipment Utilized During Treatment Gait belt    Activity Tolerance Patient tolerated treatment well    Behavior During Therapy [facility] for tasks assessed/performed;Impulsive              Past Medical History:  Diagnosis Date   Arthritis    Cataract    NS OU   Hypertension    Stroke [facility])    Past Surgical History:  Procedure Laterality Date   HIP FRACTURE SURGERY     LEG AMPUTATION BELOW KNEE     left leg   TOTAL HIP ARTHROPLASTY Left 10/31/2019   TOTAL HIP ARTHROPLASTY Left 10/31/2019   Procedure: LEFT TOTAL HIP ARTHROPLASTY, POSTERIOR;  Surgeon: Tarry Kos, MD;  Location: [facility];  Service: Orthopedics;  Laterality: Left;   Patient Active Problem List   Diagnosis Date Noted   Erectile dysfunction 04/03/2022   Anemia 07/06/2020   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   Closed displaced fracture of left femoral neck with delayed healing 09/28/2019   Establishing care with new doctor, encounter for 06/01/2019   TIA (transient ischemic attack) 09/13/2018   Cerebellar stroke ([facility]) 09/06/2018   Hypoglycemia due to insulin 09/06/2018   Tobacco abuse 09/06/2018   Hyperlipidemia 09/06/2018   Essential hypertension 09/06/2018   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[facility]) 04/16/2017    ONSET DATE: 04/03/2022 (referral)  REFERRING DIAG: I63.9 (ICD-10-CM) - Cerebellar stroke ([facility])  THERAPY DIAG:  Unsteadiness on feet  Muscle weakness (generalized)  Other abnormalities  of gait and mobility  Ataxic gait  Rationale for Evaluation and Treatment: Rehabilitation  SUBJECTIVE:                                                                                                                                                                                             SUBJECTIVE STATEMENT: Pt arrives to session in transport chair. Encouraged pt to bring manual WC to next session, as the brakes of his transport chair are broken. Pt reports he does not want anyone to  lift the "heavy chair". Having some L shoulder pain today, thinks he slept wrong on it. No falls.   Pt accompanied by: self  PERTINENT HISTORY: Hx of L BKA, CVA, HTN   PAIN:  Are you having pain? No  PRECAUTIONS: Fall and Other: L BKA  WEIGHT BEARING RESTRICTIONS: No  FALLS: Has patient fallen in last 6 months? No  LIVING ENVIRONMENT: Lives with:  [name] Lives in: House/apartment Stairs: Yes: External: 3 steps; on right going up Has following equipment at home: Dan Humphreys - 2 wheeled, Environmental consultant - 4 wheeled, Wheelchair (manual), shower chair, and Grab bars  PLOF: Requires assistive device for independence, Needs assistance with ADLs, Needs assistance with homemaking, and pt reports he is now cooking small meals and "moving"   PATIENT GOALS: "My balance"  OBJECTIVE:   TODAY'S TREATMENT:    NMR  In // bars for improved lateral weight shifting, LE coordination and midline orientation: Alt toe taps to 6"step w/two colored dots placed on top for visual target, x20 per side w/BUE support. Pt performed well w/use of BUEs, so progressed to LUE support only, x10 reps per side, and pt very limited by ataxic movement of RLE.  Standing w/hip-width BOS and no UE support, x2 minutes. Noted moderate A/P sway but no LOB noted  Progressed to standing w/romberg stance, x2 minutes w/intermittent UE support and min A for steadying assist. Pt frequently losing balance to L side and noted L truncal lean preference. Min  cues to use mirror for visual biofeedback on trunk position to facilitate shift to R side. Min tactile cues applied to pelvis to facilitate shift to R side, but pt unable to maintain.  Standing on blue ariex: Hip-width apart, x4 minutes without UE support. Min A to correct LOB to L side > R side and noted mod A/P sway (anterior > posterior) Standing on rockerboard in L/R direction  Attempted to stand without UE support w/goal to stay in middle. Pt required min-mod A to stabilize as he has L lean preference and overcorrection when losing balance to R side. Pt very challenged by this and reported significant fatigue following activity.   Ther Ex SciFit multi-peaks level 4 for 8 minutes using BLEs only for dynamic cardiovascular conditioning, equal use of BLES and BLE strength. RPE of 9/10 following activity.    Gait pattern: step through pattern, decreased stride length, decreased hip/knee flexion- Right, decreased hip/knee flexion- Left, ataxic, trunk flexed, poor foot clearance- Right, and poor foot clearance- Left Distance walked: Various clinic distances  Assistive device utilized: Walker - 2 wheeled and L BKA prosthesis Level of assistance: Min A Comments: Pt demonstrated increased ataxia of RLE today and required cues to maintain proper foot placement, as he lost balance to L side during beginning of session. Following Scifit, pt's LE coordination improved and he progressed to CGA.      PATIENT EDUCATION: Education details: Continue HEP  Person educated: Patient Education method: Explanation, Demonstration, Actor cues, and Verbal cues Education comprehension: verbalized understanding and needs further education  HOME EXERCISE PROGRAM: Access Code: [email or web address] URL: [email or web address] Date: 02/14/2022 Prepared by: Camille Bal   Exercises - Sit to Stand with Resistance Around Legs  - 1 x daily - 5 x weekly - 2 sets - 10 reps - Standing March with Counter  Support  - 1 x daily - 7 x weekly - 3 sets - 10 reps - Standing Hip Extension with Counter Support  - 1 x daily - 7 x weekly - 3  sets - 10 reps - Side Stepping with Resistance at Thighs and Counter Support  - 1 x daily - 7 x weekly - 3 sets - 10 reps - Supine Bridge  - 1 x daily - 7 x weekly - 3 sets - 10 reps - Standing Single Leg Stance with Counter Support  - 1 x daily - 7 x weekly - 1 sets - 5 reps - 10 seconds hold - Standing Hamstring Curl with Chair Support  - 1 x daily - 7 x weekly - 3 sets - 10 reps  GOALS: Goals reviewed with patient? Yes  SHORT TERM GOALS: Target date: 05/09/2022   Pt will improve gait velocity to at least 1.0 ft/s w/LRAD and min A for improved gait efficiency and safety  Baseline: 0.78 ft/s with rollator and mod A Goal status: INITIAL  2.  Pt will improve 5 x STS to less than or equal to 38 seconds w/BUE support to demonstrate improved functional strength and transfer efficiency.   Baseline: 44.91s w/BUE support and significant posterior bracing  Goal status: INITIAL  3.  Patient will ascend/descend 4 stairs with CGA with step to gait pattern with fair foot placement and bilateral UE support on single railing for safe entrance into home. Baseline: poor foot placement/UE use and minA Goal status: INITIAL  4.  Patient will improve TUG score to 40" with LRAD to indicate progress towards a decreased risk of falls and demonstrate improved overall mobility.   Baseline: 46.7" with 2WW + CGA Goal status: INITIAL   LONG TERM GOALS: Target date: 06/06/2022   Patient will ascend/descend 4 stairs with SBA with step to gait pattern with good foot placement and bilateral UE support on single railing for safe entrance into home. Baseline: poor foot placement/UE use and minA Goal status: INITIAL  2.  Patient will improve TUG score to 35" with LRAD to indicate progress towards decreased risk of falls and demonstrate improved overall mobility.   Baseline: 46.7" with 2WW  + CGA Goal status: INITIAL  3.  Pt will improve 5 x STS to less than or equal to 33 seconds w/BUE support to demonstrate improved functional strength and transfer efficiency.   Baseline: 44.91s w/BUE support and significant posterior bracing  Goal status: INITIAL  4.  Pt will improve gait velocity to at least 1.3 ft/s w/LRAD and CGA for improved gait efficiency and safety  Baseline: 0.78 ft/s with rollator and mod A Goal status: INITIAL  5.  Pt will be independent with final HEP for improved transfers, gait and strength Baseline:  Goal status: INITIAL   ASSESSMENT:  CLINICAL IMPRESSION: Emphasis of skilled PT session on LE coordination, midline orientation and lateral weight shifting. Pt continues to be limited by R hemibody ataxia and decreased endurance, resulting in over-reliance of L side and LOB to L side. Pt did improve weightshift to R side w/use of visual cues and rockerboard, but demonstrates overcorrection w/LOB to R side, requiring min A to stabilize. Continue POC.     OBJECTIVE IMPAIRMENTS: Abnormal gait, decreased activity tolerance, decreased balance, decreased coordination, decreased knowledge of use of DME, difficulty walking, decreased strength, decreased safety awareness, impaired perceived functional ability, impaired vision/preception, improper body mechanics, prosthetic dependency , and pain  ACTIVITY LIMITATIONS: carrying, lifting, bending, standing, squatting, stairs, transfers, locomotion level, and caring for others  PARTICIPATION LIMITATIONS: meal prep, cleaning, laundry, interpersonal relationship, driving, shopping, community activity, occupation, and yard work  PERSONAL FACTORS: Age, Behavior pattern, Fitness, Past/current experiences, Time since  onset of injury/illness/exacerbation, Transportation, and 1-2 comorbidities: L BKA and severe truncal and limb ataxia  are also affecting patient's functional outcome.   REHAB POTENTIAL: Fair due to time since  onset of CVA, lack of support at home, severity of ataxia and impulsive behavior  CLINICAL DECISION MAKING: Evolving/moderate complexity  EVALUATION COMPLEXITY: Moderate  PLAN:  PT FREQUENCY: 2x/week  PT DURATION: 8 weeks  PLANNED INTERVENTIONS: Therapeutic exercises, Therapeutic activity, Neuromuscular re-education, Balance training, Gait training, Patient/Family education, Self Care, Joint mobilization, Stair training, Vestibular training, Canalith repositioning, Prosthetic training, DME instructions, Wheelchair mobility training, Manual therapy, and Re-evaluation  PLAN FOR NEXT SESSION: update HEP as indicated, eccentric lower sit to stand from elevated surface, education on equipment safety, functional transfers to and from various surfaces, safety on stairs, standing balance, lateral weight shifting  Amena Dockham E Jamerica Snavely, PT, DPT 04/25/2022, 10:09 AM

## 2022-04-29 ENCOUNTER — Ambulatory Visit: Payer: 59 | Admitting: Physical Therapy

## 2022-04-29 DIAGNOSIS — M6281 Muscle weakness (generalized): Secondary | ICD-10-CM

## 2022-04-29 DIAGNOSIS — R2689 Other abnormalities of gait and mobility: Secondary | ICD-10-CM

## 2022-04-29 DIAGNOSIS — R2681 Unsteadiness on feet: Secondary | ICD-10-CM

## 2022-04-29 NOTE — Therapy (Signed)
OUTPATIENT PHYSICAL THERAPY NEURO TREATMENT   Patient Name: Kristopher Ruiz. MRN: [id number] DOB:January 17, 1951, 72 y.o., male 61 Date: 04/29/2022   PCP: Jerre Simon, MD REFERRING PROVIDER: Westley Chandler, MD  END OF SESSION:  PT End of Session - 04/29/22 0934     Visit Number 4    Number of Visits 17    Date for PT Re-Evaluation 06/20/22    Authorization Type UHC Medicare/Medicaid of [place]    PT Start Time 0932    PT Stop Time 1014    PT Time Calculation (min) 42 min    Equipment Utilized During Treatment Gait belt    Activity Tolerance Patient tolerated treatment well    Behavior During Therapy WFL for tasks assessed/performed               Past Medical History:  Diagnosis Date   Arthritis    Cataract    NS OU   Hypertension    Stroke [facility])    Past Surgical History:  Procedure Laterality Date   HIP FRACTURE SURGERY     LEG AMPUTATION BELOW KNEE     left leg   TOTAL HIP ARTHROPLASTY Left 10/31/2019   TOTAL HIP ARTHROPLASTY Left 10/31/2019   Procedure: LEFT TOTAL HIP ARTHROPLASTY, POSTERIOR;  Surgeon: Tarry Kos, MD;  Location: [facility] OR;  Service: Orthopedics;  Laterality: Left;   Patient Active Problem List   Diagnosis Date Noted   Erectile dysfunction 04/03/2022   Anemia 07/06/2020   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   Closed displaced fracture of left femoral neck with delayed healing 09/28/2019   Establishing care with new doctor, encounter for 06/01/2019   TIA (transient ischemic attack) 09/13/2018   Cerebellar stroke (HCC) 09/06/2018   Hypoglycemia due to insulin 09/06/2018   Tobacco abuse 09/06/2018   Hyperlipidemia 09/06/2018   Essential hypertension 09/06/2018   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HCC) 04/16/2017    ONSET DATE: 04/03/2022 (referral)  REFERRING DIAG: I63.9 (ICD-10-CM) - Cerebellar stroke (HCC)  THERAPY DIAG:  Unsteadiness on feet  Other abnormalities of gait and mobility  Muscle weakness  (generalized)  Rationale for Evaluation and Treatment: Rehabilitation  SUBJECTIVE:                                                                                                                                                                                             SUBJECTIVE STATEMENT: Pt arrives to session in transport chair. States his exercises are going well. No falls. Has not been to the gym in a while.   Pt accompanied by: self  PERTINENT HISTORY: Hx of  L BKA, CVA, HTN   PAIN:  Are you having pain? No  PRECAUTIONS: Fall and Other: L BKA  WEIGHT BEARING RESTRICTIONS: No  FALLS: Has patient fallen in last 6 months? No  LIVING ENVIRONMENT: Lives with:  Iona Beard Lives in: House/apartment Stairs: Yes: External: 3 steps; on right going up Has following equipment at home: Environmental consultant - 2 wheeled, Environmental consultant - 4 wheeled, Wheelchair (manual), shower chair, and Grab bars  PLOF: Requires assistive device for independence, Needs assistance with ADLs, Needs assistance with homemaking, and pt reports he is now cooking small meals and "moving"   PATIENT GOALS: "My balance"  OBJECTIVE:   TODAY'S TREATMENT:    NMR  Mass practice of sit <>stands on elevated mat for improved anterior weight shift, immediate standing balance and eccentric control of quads. Pt required max verbal and visual cues for proper body mechanics and continues to demonstrate poor timing of bilateral knee extension, resulting in retropulsion and requiring min-mod A to stabilize. Regressed to part-practice focusing on steps 1-2 of transfer using NDT principles by placing chair as a target to reach towards in front of pt. Pt able to perform 3-4/20 reps well, but continued to demonstrate improper timing of knee extension, impairing his ability to shift weight forward. Pt also having difficulty w/eccentric control of stand <>sit due to tendency to maintain knee extension w/decent and "flopping" onto mat. Max cues to facilitate  knee flexion w/decent but pt unable to control knee position well. RPE of 7/10 following activity.   Ther Ex SciFit multi-peaks level 7 for 10 minutes using BLEs only for dynamic cardiovascular conditioning, lateral weight shifting and BLE strength. RPE of 10/10 following activity.    Gait pattern: step through pattern, decreased stride length, decreased hip/knee flexion- Right, decreased hip/knee flexion- Left, ataxic, trunk flexed, poor foot clearance- Right, and poor foot clearance- Left Distance walked: Various clinic distances  Assistive device utilized: Walker - 2 wheeled and L BKA prosthesis Level of assistance: Min A Comments: Pt able to self-correct improper R foot placement today without cues.    PATIENT EDUCATION: Education details: Continue HEP  Person educated: Patient Education method: Explanation, Demonstration, Corporate treasurer cues, and Verbal cues Education comprehension: verbalized understanding and needs further education  HOME EXERCISE PROGRAM: Access Code: [email or web address] URL: [email or web address] Date: 02/14/2022 Prepared by: Elease Etienne   Exercises - Sit to Stand with Resistance Around Legs  - 1 x daily - 5 x weekly - 2 sets - 10 reps - Standing March with Counter Support  - 1 x daily - 7 x weekly - 3 sets - 10 reps - Standing Hip Extension with Counter Support  - 1 x daily - 7 x weekly - 3 sets - 10 reps - Side Stepping with Resistance at Thighs and Counter Support  - 1 x daily - 7 x weekly - 3 sets - 10 reps - Supine Bridge  - 1 x daily - 7 x weekly - 3 sets - 10 reps - Standing Single Leg Stance with Counter Support  - 1 x daily - 7 x weekly - 1 sets - 5 reps - 10 seconds hold - Standing Hamstring Curl with Chair Support  - 1 x daily - 7 x weekly - 3 sets - 10 reps  GOALS: Goals reviewed with patient? Yes  SHORT TERM GOALS: Target date: 05/09/2022   Pt will improve gait velocity to at least 1.0 ft/s w/LRAD and min A for improved gait efficiency  and safety  Baseline: 0.78 ft/s with  rollator and mod A Goal status: INITIAL  2.  Pt will improve 5 x STS to less than or equal to 38 seconds w/BUE support to demonstrate improved functional strength and transfer efficiency.   Baseline: 44.91s w/BUE support and significant posterior bracing  Goal status: INITIAL  3.  Patient will ascend/descend 4 stairs with CGA with step to gait pattern with fair foot placement and bilateral UE support on single railing for safe entrance into home. Baseline: poor foot placement/UE use and minA Goal status: INITIAL  4.  Patient will improve TUG score to 40" with LRAD to indicate progress towards a decreased risk of falls and demonstrate improved overall mobility.   Baseline: 46.7" with 2WW + CGA Goal status: INITIAL   LONG TERM GOALS: Target date: 06/06/2022   Patient will ascend/descend 4 stairs with SBA with step to gait pattern with good foot placement and bilateral UE support on single railing for safe entrance into home. Baseline: poor foot placement/UE use and minA Goal status: INITIAL  2.  Patient will improve TUG score to 35" with LRAD to indicate progress towards decreased risk of falls and demonstrate improved overall mobility.   Baseline: 46.7" with 2WW + CGA Goal status: INITIAL  3.  Pt will improve 5 x STS to less than or equal to 33 seconds w/BUE support to demonstrate improved functional strength and transfer efficiency.   Baseline: 44.91s w/BUE support and significant posterior bracing  Goal status: INITIAL  4.  Pt will improve gait velocity to at least 1.3 ft/s w/LRAD and CGA for improved gait efficiency and safety  Baseline: 0.78 ft/s with rollator and mod A Goal status: INITIAL  5.  Pt will be independent with final HEP for improved transfers, gait and strength Baseline:  Goal status: INITIAL   ASSESSMENT:  CLINICAL IMPRESSION: Emphasis of skilled PT session on transfers, anterior weight shifting, immediate standing  balance and BLE strength. Pt continues to demonstrate asymmetry w/sit <>stands, rotating to L side and requiring assistance for immediate standing balance. Pt has difficulty w/proper timing of knee extension w/sit <>stand, resulting in significant retropulsion and bracing against seated surface for stability. Pt did well w/visual cues to facilitate anterior weight shift but will benefit from continued practice. Continue POC.    OBJECTIVE IMPAIRMENTS: Abnormal gait, decreased activity tolerance, decreased balance, decreased coordination, decreased knowledge of use of DME, difficulty walking, decreased strength, decreased safety awareness, impaired perceived functional ability, impaired vision/preception, improper body mechanics, prosthetic dependency , and pain  ACTIVITY LIMITATIONS: carrying, lifting, bending, standing, squatting, stairs, transfers, locomotion level, and caring for others  PARTICIPATION LIMITATIONS: meal prep, cleaning, laundry, interpersonal relationship, driving, shopping, community activity, occupation, and yard work  PERSONAL FACTORS: Age, Behavior pattern, Fitness, Past/current experiences, Time since onset of injury/illness/exacerbation, Transportation, and 1-2 comorbidities: L BKA and severe truncal and limb ataxia  are also affecting patient's functional outcome.   REHAB POTENTIAL: Fair due to time since onset of CVA, lack of support at home, severity of ataxia and impulsive behavior  CLINICAL DECISION MAKING: Evolving/moderate complexity  EVALUATION COMPLEXITY: Moderate  PLAN:  PT FREQUENCY: 2x/week  PT DURATION: 8 weeks  PLANNED INTERVENTIONS: Therapeutic exercises, Therapeutic activity, Neuromuscular re-education, Balance training, Gait training, Patient/Family education, Self Care, Joint mobilization, Stair training, Vestibular training, Canalith repositioning, Prosthetic training, DME instructions, Wheelchair mobility training, Manual therapy, and  Re-evaluation  PLAN FOR NEXT SESSION: update HEP as indicated, eccentric lower sit to stand from elevated surface, education on equipment safety, functional transfers to and from  various surfaces, safety on stairs, standing balance, lateral weight shifting, alt toe taps w/resistance   Cruzita Lederer Senie Lanese, PT, DPT 04/29/2022, 10:19 AM

## 2022-05-02 ENCOUNTER — Ambulatory Visit: Payer: 59 | Attending: Orthopedic Surgery | Admitting: Physical Therapy

## 2022-05-02 DIAGNOSIS — R260 Ataxic gait: Secondary | ICD-10-CM | POA: Diagnosis present

## 2022-05-02 DIAGNOSIS — R278 Other lack of coordination: Secondary | ICD-10-CM | POA: Diagnosis present

## 2022-05-02 DIAGNOSIS — R2681 Unsteadiness on feet: Secondary | ICD-10-CM | POA: Insufficient documentation

## 2022-05-02 DIAGNOSIS — M6281 Muscle weakness (generalized): Secondary | ICD-10-CM | POA: Insufficient documentation

## 2022-05-02 DIAGNOSIS — S88112A Complete traumatic amputation at level between knee and ankle, left lower leg, initial encounter: Secondary | ICD-10-CM | POA: Diagnosis present

## 2022-05-02 DIAGNOSIS — R293 Abnormal posture: Secondary | ICD-10-CM | POA: Diagnosis present

## 2022-05-02 DIAGNOSIS — R2689 Other abnormalities of gait and mobility: Secondary | ICD-10-CM | POA: Diagnosis present

## 2022-05-02 NOTE — Therapy (Signed)
OUTPATIENT PHYSICAL THERAPY NEURO TREATMENT   Patient Name: Kristopher Ruiz. MRN: [id number] DOB:1951/02/05, 72 y.o., male 40 Date: 05/02/2022   PCP: Alen Bleacher, MD REFERRING PROVIDER: Martyn Malay, MD  END OF SESSION:  PT End of Session - 05/02/22 0932     Visit Number 5    Number of Visits 17    Date for PT Re-Evaluation 06/20/22    Authorization Type UHC Medicare/Medicaid of [place]    PT Start Time [phone]    PT Stop Time 1012    PT Time Calculation (min) 41 min    Equipment Utilized During Treatment Gait belt    Activity Tolerance Patient tolerated treatment well    Behavior During Therapy WFL for tasks assessed/performed                Past Medical History:  Diagnosis Date   Arthritis    Cataract    NS OU   Hypertension    Stroke [facility])    Past Surgical History:  Procedure Laterality Date   HIP FRACTURE SURGERY     LEG AMPUTATION BELOW KNEE     left leg   TOTAL HIP ARTHROPLASTY Left 10/31/2019   TOTAL HIP ARTHROPLASTY Left 10/31/2019   Procedure: LEFT TOTAL HIP ARTHROPLASTY, POSTERIOR;  Surgeon: Leandrew Koyanagi, MD;  Location: [place];  Service: Orthopedics;  Laterality: Left;   Patient Active Problem List   Diagnosis Date Noted   Erectile dysfunction 04/03/2022   Anemia 07/06/2020   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   Closed displaced fracture of left femoral neck with delayed healing 09/28/2019   Establishing care with new doctor, encounter for 06/01/2019   TIA (transient ischemic attack) 09/13/2018   Cerebellar stroke ([place]) 09/06/2018   Hypoglycemia due to insulin 09/06/2018   Tobacco abuse 09/06/2018   Hyperlipidemia 09/06/2018   Essential hypertension 09/06/2018   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[place]) 04/16/2017    ONSET DATE: 04/03/2022 (referral)  REFERRING DIAG: I63.9 (ICD-10-CM) - Cerebellar stroke ([place])  THERAPY DIAG:  Unsteadiness on feet  Other abnormalities of gait and mobility  Muscle weakness  (generalized)  Ataxic gait  Rationale for Evaluation and Treatment: Rehabilitation  SUBJECTIVE:                                                                                                                                                                                             SUBJECTIVE STATEMENT: Pt denies any acute changes.   Pt accompanied by: self  PERTINENT HISTORY: Hx of L BKA, CVA, HTN   PAIN:  Are you having pain? No  PRECAUTIONS:  Fall and Other: L BKA  WEIGHT BEARING RESTRICTIONS: No  FALLS: Has patient fallen in last 6 months? No  LIVING ENVIRONMENT: Lives with:  Iona Beard Lives in: House/apartment Stairs: Yes: External: 3 steps; on right going up Has following equipment at home: Gilford Rile - 2 wheeled, Environmental consultant - 4 wheeled, Wheelchair (manual), shower chair, and Grab bars  PLOF: Requires assistive device for independence, Needs assistance with ADLs, Needs assistance with homemaking, and pt reports he is now cooking small meals and "moving"   PATIENT GOALS: "My balance"  OBJECTIVE:   TODAY'S TREATMENT:    NMR  In // bars for improved lateral weight shifting, single leg stability and LE coordination: Alt advance/retreat using 4" hurdle w/BUE > LUE support, x20 reps per side. Mod verbal cues to facilitate abduction of RLE, as pt tends to scissor leg w/advancement and retreat. Min A for lateral LOB correction to L side  Progressed to tying green theraband around R ankle to bias adduction, x15 reps to facilitate abduction of RLE w/LUE support. Pt able to place RLE well w/advancement but had significant difficulty w/retreat. Min A throughout for R lateral LOB correction  Gait Training  Gait pattern: step through pattern, decreased step length- Left, decreased stance time- Right, decreased stride length, decreased hip/knee flexion- Right, ataxic, and trunk flexed Distance walked: 200' Assistive device utilized: Environmental consultant - 2 wheeled Level of assistance: SBA Comments: tied  green theraband around distal quads to provide tactile cue for hip abduction throughout for improved foot placement. Pt much more stable w/use of band and had no LOB. Min cues for decreased reliance on BUEs and upright posture.    STAIRS:  Level of Assistance: SBA and CGA  Stair Negotiation Technique: Step to Pattern Alternating Pattern  with Single Rail on Right Bilateral Rails  Number of Stairs: 12   Height of Stairs: 6"  Comments: Pt initially navigating steps w/alternating pattern and bilateral handrails and noted significant posterior LOB while descending steps. On second round, pt used R handrail only using alternating pattern and pt continues to demonstrate significant instability and posterior LOB, requiring CGA. Educated pt on using step-to pattern instead w/single handrail on R (as this is his home setup) and pt much more stable w/this technique, progressing to SBA. Encouraged pt to use this technique at home for improved safety. Pt verbalized understanding.    PATIENT EDUCATION: Education details: Continue HEP, proper stair technique  Person educated: Patient Education method: Explanation, Demonstration, Tactile cues, and Verbal cues Education comprehension: verbalized understanding and needs further education  HOME EXERCISE PROGRAM: Access Code: [email or web address] URL: [email or web address] Date: 02/14/2022 Prepared by: Elease Etienne   Exercises - Sit to Stand with Resistance Around Legs  - 1 x daily - 5 x weekly - 2 sets - 10 reps - Standing March with Counter Support  - 1 x daily - 7 x weekly - 3 sets - 10 reps - Standing Hip Extension with Counter Support  - 1 x daily - 7 x weekly - 3 sets - 10 reps - Side Stepping with Resistance at Thighs and Counter Support  - 1 x daily - 7 x weekly - 3 sets - 10 reps - Supine Bridge  - 1 x daily - 7 x weekly - 3 sets - 10 reps - Standing Single Leg Stance with Counter Support  - 1 x daily - 7 x weekly - 1 sets - 5 reps - 10  seconds hold - Standing Hamstring Curl with Chair Support  - 1 x  daily - 7 x weekly - 3 sets - 10 reps  GOALS: Goals reviewed with patient? Yes  SHORT TERM GOALS: Target date: 05/09/2022   Pt will improve gait velocity to at least 1.0 ft/s w/LRAD and min A for improved gait efficiency and safety  Baseline: 0.78 ft/s with rollator and mod A Goal status: INITIAL  2.  Pt will improve 5 x STS to less than or equal to 38 seconds w/BUE support to demonstrate improved functional strength and transfer efficiency.   Baseline: 44.91s w/BUE support and significant posterior bracing  Goal status: INITIAL  3.  Patient will ascend/descend 4 stairs with CGA with step to gait pattern with fair foot placement and bilateral UE support on single railing for safe entrance into home. Baseline: poor foot placement/UE use and minA Goal status: INITIAL  4.  Patient will improve TUG score to 40" with LRAD to indicate progress towards a decreased risk of falls and demonstrate improved overall mobility.   Baseline: 46.7" with 2WW + CGA Goal status: INITIAL   LONG TERM GOALS: Target date: 06/06/2022   Patient will ascend/descend 4 stairs with SBA with step to gait pattern with good foot placement and bilateral UE support on single railing for safe entrance into home. Baseline: poor foot placement/UE use and minA Goal status: INITIAL  2.  Patient will improve TUG score to 35" with LRAD to indicate progress towards decreased risk of falls and demonstrate improved overall mobility.   Baseline: 46.7" with 2WW + CGA Goal status: INITIAL  3.  Pt will improve 5 x STS to less than or equal to 33 seconds w/BUE support to demonstrate improved functional strength and transfer efficiency.   Baseline: 44.91s w/BUE support and significant posterior bracing  Goal status: INITIAL  4.  Pt will improve gait velocity to at least 1.3 ft/s w/LRAD and CGA for improved gait efficiency and safety  Baseline: 0.78 ft/s with  rollator and mod A Goal status: INITIAL  5.  Pt will be independent with final HEP for improved transfers, gait and strength Baseline:  Goal status: INITIAL   ASSESSMENT:  CLINICAL IMPRESSION: Emphasis of skilled PT session on lateral weight shifting, stair navigation and hip abduction w/gait. Pt continues to be limited by improper foot placement of RLE, resulting in very narrow BOS and LOB to L side. Pt responds well to use of tactile cues via theraband to facilitate hip abduction on R side but has difficulty w/carryover without band. Pt educated on proper stair technique for improved stability and safety at home, pt verbalized understanding. Continue POC.    OBJECTIVE IMPAIRMENTS: Abnormal gait, decreased activity tolerance, decreased balance, decreased coordination, decreased knowledge of use of DME, difficulty walking, decreased strength, decreased safety awareness, impaired perceived functional ability, impaired vision/preception, improper body mechanics, prosthetic dependency , and pain  ACTIVITY LIMITATIONS: carrying, lifting, bending, standing, squatting, stairs, transfers, locomotion level, and caring for others  PARTICIPATION LIMITATIONS: meal prep, cleaning, laundry, interpersonal relationship, driving, shopping, community activity, occupation, and yard work  PERSONAL FACTORS: Age, Behavior pattern, Fitness, Past/current experiences, Time since onset of injury/illness/exacerbation, Transportation, and 1-2 comorbidities: L BKA and severe truncal and limb ataxia  are also affecting patient's functional outcome.   REHAB POTENTIAL: Fair due to time since onset of CVA, lack of support at home, severity of ataxia and impulsive behavior  CLINICAL DECISION MAKING: Evolving/moderate complexity  EVALUATION COMPLEXITY: Moderate  PLAN:  PT FREQUENCY: 2x/week  PT DURATION: 8 weeks  PLANNED INTERVENTIONS: Therapeutic exercises, Therapeutic activity,  Neuromuscular re-education, Balance  training, Gait training, Patient/Family education, Self Care, Joint mobilization, Stair training, Vestibular training, Canalith repositioning, Prosthetic training, DME instructions, Wheelchair mobility training, Manual therapy, and Re-evaluation  PLAN FOR NEXT SESSION: update HEP as indicated, eccentric lower sit to stand from elevated surface, education on equipment safety, functional transfers to and from various surfaces, safety on stairs, standing balance, lateral weight shifting, alt toe taps w/resistance   Cruzita Lederer Kiersten Coss, PT, DPT 05/02/2022, 10:16 AM

## 2022-05-06 ENCOUNTER — Ambulatory Visit: Payer: 59 | Admitting: Physical Therapy

## 2022-05-06 DIAGNOSIS — M6281 Muscle weakness (generalized): Secondary | ICD-10-CM

## 2022-05-06 DIAGNOSIS — R2681 Unsteadiness on feet: Secondary | ICD-10-CM | POA: Diagnosis not present

## 2022-05-06 DIAGNOSIS — R2689 Other abnormalities of gait and mobility: Secondary | ICD-10-CM

## 2022-05-06 NOTE — Therapy (Signed)
OUTPATIENT PHYSICAL THERAPY NEURO TREATMENT   Patient Name: Kristopher Ruiz. MRN: [id number] DOB:[birth year], 72 y.o., male 48 Date: 05/06/2022   PCP: Alen Bleacher, MD REFERRING PROVIDER: Martyn Malay, MD  END OF SESSION:  PT End of Session - 05/06/22 0937     Visit Number 6    Number of Visits 17    Date for PT Re-Evaluation 06/20/22    Authorization Type UHC Medicare/Medicaid of [place]    PT Start Time [id number]   Therapist running late from previous session   PT Stop Time 1016    PT Time Calculation (min) 40 min    Equipment Utilized During Treatment Gait belt   L BKA prosthetic   Activity Tolerance Patient tolerated treatment well    Behavior During Therapy WFL for tasks assessed/performed                 Past Medical History:  Diagnosis Date   Arthritis    Cataract    NS OU   Hypertension    Stroke [facility])    Past Surgical History:  Procedure Laterality Date   HIP FRACTURE SURGERY     LEG AMPUTATION BELOW KNEE     left leg   TOTAL HIP ARTHROPLASTY Left 10/31/2019   TOTAL HIP ARTHROPLASTY Left 10/31/2019   Procedure: LEFT TOTAL HIP ARTHROPLASTY, POSTERIOR;  Surgeon: Leandrew Koyanagi, MD;  Location: [facility];  Service: Orthopedics;  Laterality: Left;   Patient Active Problem List   Diagnosis Date Noted   Erectile dysfunction 04/03/2022   Anemia 07/06/2020   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   Closed displaced fracture of left femoral neck with delayed healing 09/28/2019   Establishing care with new doctor, encounter for 06/01/2019   TIA (transient ischemic attack) 09/13/2018   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   Hypoglycemia due to insulin 09/06/2018   Tobacco abuse 09/06/2018   Hyperlipidemia 09/06/2018   Essential hypertension 09/06/2018   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[place]) 04/16/2017    ONSET DATE: 04/03/2022 (referral)  REFERRING DIAG: I63.9 (ICD-10-CM) - Cerebellar stroke ([place])  THERAPY DIAG:  Unsteadiness on  feet  Other abnormalities of gait and mobility  Muscle weakness (generalized)  Rationale for Evaluation and Treatment: Rehabilitation  SUBJECTIVE:                                                                                                                                                                                             SUBJECTIVE STATEMENT: Pt reports he had a fall yesterday, slipped while going out the back door and landed on his L side/back. No injuries.  Was able to get up by himself.   Pt accompanied by: self  PERTINENT HISTORY: Hx of L BKA, CVA, HTN   PAIN:  Are you having pain? No  PRECAUTIONS: Fall and Other: L BKA  WEIGHT BEARING RESTRICTIONS: No  FALLS: Has patient fallen in last 6 months? No  LIVING ENVIRONMENT: Lives with:  [name] Lives in: House/apartment Stairs: Yes: External: 3 steps; on right going up Has following equipment at home: Environmental consultant - 2 wheeled, Environmental consultant - 4 wheeled, Wheelchair (manual), shower chair, and Grab bars  PLOF: Requires assistive device for independence, Needs assistance with ADLs, Needs assistance with homemaking, and pt reports he is now cooking small meals and "moving"   PATIENT GOALS: "My balance"  OBJECTIVE:   TODAY'S TREATMENT:    NMR  Mass practice sit <>stands for improved anterior weight shifting, immediate standing balance and equal weightbearing on BLEs. Therapist sat in front of pt throughout to provide tactile cues to pelvis to promote equal weightbearing and shift to R side. Min A throughout for steadying assist: With green theraband around distal quads, x10 reps w/intermittent UE support. Provided max multimodal cues for proper body mechanics and facilitation of anterior weight shift. Noted pt sliding RLE backwards and rotating to R side while bracing against mat w/LLE. Pt also tends to extend knees too soon, resulting in posterior LOB.  Progressed to x10 reps without theraband and pt continued to demonstrate  difficulty w/anterior shift and improper timing of knee extension  Performed x8 reps w/ LLE elevated on 2" step to further promote shift to R side and pt performed 3 reps w/proper form but continues to have difficulty w/timing of transfer and proper body mechanics without cues.   Gait Training  Gait pattern: step through pattern, decreased step length- Left, decreased stance time- Right, decreased stride length, decreased hip/knee flexion- Right, ataxic, and trunk flexed Distance walked: 230' Assistive device utilized: Environmental consultant - 2 wheeled Level of assistance: SBA Comments: tied green theraband around distal quads to provide tactile cue for hip abduction throughout for improved foot placement. Pt much more stable w/use of band and had no LOB. Min cues for decreased reliance on BUEs and upright posture.    PATIENT EDUCATION: Education details: Continue HEP  Person educated: Patient Education method: Explanation, Demonstration, Actor cues, and Verbal cues Education comprehension: verbalized understanding and needs further education  HOME EXERCISE PROGRAM: Access Code: [email or web address] URL: [email or web address] Date: 02/14/2022 Prepared by: Camille Bal   Exercises - Sit to Stand with Resistance Around Legs  - 1 x daily - 5 x weekly - 2 sets - 10 reps - Standing March with Counter Support  - 1 x daily - 7 x weekly - 3 sets - 10 reps - Standing Hip Extension with Counter Support  - 1 x daily - 7 x weekly - 3 sets - 10 reps - Side Stepping with Resistance at Thighs and Counter Support  - 1 x daily - 7 x weekly - 3 sets - 10 reps - Supine Bridge  - 1 x daily - 7 x weekly - 3 sets - 10 reps - Standing Single Leg Stance with Counter Support  - 1 x daily - 7 x weekly - 1 sets - 5 reps - 10 seconds hold - Standing Hamstring Curl with Chair Support  - 1 x daily - 7 x weekly - 3 sets - 10 reps  GOALS: Goals reviewed with patient? Yes  SHORT TERM GOALS: Target date: 05/09/2022   Pt  will  improve gait velocity to at least 1.0 ft/s w/LRAD and min A for improved gait efficiency and safety  Baseline: 0.78 ft/s with rollator and mod A Goal status: INITIAL  2.  Pt will improve 5 x STS to less than or equal to 38 seconds w/BUE support to demonstrate improved functional strength and transfer efficiency.   Baseline: 44.91s w/BUE support and significant posterior bracing  Goal status: INITIAL  3.  Patient will ascend/descend 4 stairs with CGA with step to gait pattern with fair foot placement and bilateral UE support on single railing for safe entrance into home. Baseline: poor foot placement/UE use and minA Goal status: INITIAL  4.  Patient will improve TUG score to 40" with LRAD to indicate progress towards a decreased risk of falls and demonstrate improved overall mobility.   Baseline: 46.7" with 2WW + CGA Goal status: INITIAL   LONG TERM GOALS: Target date: 06/06/2022   Patient will ascend/descend 4 stairs with SBA with step to gait pattern with good foot placement and bilateral UE support on single railing for safe entrance into home. Baseline: poor foot placement/UE use and minA Goal status: INITIAL  2.  Patient will improve TUG score to 35" with LRAD to indicate progress towards decreased risk of falls and demonstrate improved overall mobility.   Baseline: 46.7" with 2WW + CGA Goal status: INITIAL  3.  Pt will improve 5 x STS to less than or equal to 33 seconds w/BUE support to demonstrate improved functional strength and transfer efficiency.   Baseline: 44.91s w/BUE support and significant posterior bracing  Goal status: INITIAL  4.  Pt will improve gait velocity to at least 1.3 ft/s w/LRAD and CGA for improved gait efficiency and safety  Baseline: 0.78 ft/s with rollator and mod A Goal status: INITIAL  5.  Pt will be independent with final HEP for improved transfers, gait and strength Baseline:  Goal status: INITIAL   ASSESSMENT:  CLINICAL  IMPRESSION: Emphasis of skilled PT session on sit <>stand transfers and equal weightbearing on BLEs. Pt continues to be limited by poor timing of knee extension and asymmetrical weight shift to L side w/transfers and gait. Pt did improve with use of tactile and visual cues but becomes frustrated with his ataxia and inability to stand without retropulsion. Pt will continue to benefit from sit <>stand practice and proper body mechanics. Continue POC.    OBJECTIVE IMPAIRMENTS: Abnormal gait, decreased activity tolerance, decreased balance, decreased coordination, decreased knowledge of use of DME, difficulty walking, decreased strength, decreased safety awareness, impaired perceived functional ability, impaired vision/preception, improper body mechanics, prosthetic dependency , and pain  ACTIVITY LIMITATIONS: carrying, lifting, bending, standing, squatting, stairs, transfers, locomotion level, and caring for others  PARTICIPATION LIMITATIONS: meal prep, cleaning, laundry, interpersonal relationship, driving, shopping, community activity, occupation, and yard work  PERSONAL FACTORS: Age, Behavior pattern, Fitness, Past/current experiences, Time since onset of injury/illness/exacerbation, Transportation, and 1-2 comorbidities: L BKA and severe truncal and limb ataxia  are also affecting patient's functional outcome.   REHAB POTENTIAL: Fair due to time since onset of CVA, lack of support at home, severity of ataxia and impulsive behavior  CLINICAL DECISION MAKING: Evolving/moderate complexity  EVALUATION COMPLEXITY: Moderate  PLAN:  PT FREQUENCY: 2x/week  PT DURATION: 8 weeks  PLANNED INTERVENTIONS: Therapeutic exercises, Therapeutic activity, Neuromuscular re-education, Balance training, Gait training, Patient/Family education, Self Care, Joint mobilization, Stair training, Vestibular training, Canalith repositioning, Prosthetic training, DME instructions, Wheelchair mobility training, Manual  therapy, and Re-evaluation  PLAN FOR NEXT  SESSION: update HEP as indicated, eccentric lower sit to stand from elevated surface, education on equipment safety, functional transfers to and from various surfaces, safety on stairs, standing balance, lateral weight shifting, alt toe taps w/resistance   Cruzita Lederer Kilyn Maragh, PT, DPT 05/06/2022, 10:17 AM

## 2022-05-09 ENCOUNTER — Ambulatory Visit: Payer: 59 | Admitting: Physical Therapy

## 2022-05-09 ENCOUNTER — Encounter: Payer: Self-pay | Admitting: Physical Therapy

## 2022-05-09 VITALS — BP 145/75 | HR 90

## 2022-05-09 DIAGNOSIS — R2689 Other abnormalities of gait and mobility: Secondary | ICD-10-CM

## 2022-05-09 DIAGNOSIS — R278 Other lack of coordination: Secondary | ICD-10-CM

## 2022-05-09 DIAGNOSIS — R293 Abnormal posture: Secondary | ICD-10-CM

## 2022-05-09 DIAGNOSIS — M6281 Muscle weakness (generalized): Secondary | ICD-10-CM

## 2022-05-09 DIAGNOSIS — S88112A Complete traumatic amputation at level between knee and ankle, left lower leg, initial encounter: Secondary | ICD-10-CM

## 2022-05-09 DIAGNOSIS — R2681 Unsteadiness on feet: Secondary | ICD-10-CM | POA: Diagnosis not present

## 2022-05-09 DIAGNOSIS — R260 Ataxic gait: Secondary | ICD-10-CM

## 2022-05-09 NOTE — Therapy (Signed)
OUTPATIENT PHYSICAL THERAPY NEURO TREATMENT   Patient Name: Margo Naula. MRN: [id number] DOB:1950-05-02, 72 y.o., male 34 Date: 05/09/2022   PCP: Alen Bleacher, MD REFERRING PROVIDER: Martyn Malay, MD  END OF SESSION:  PT End of Session - 05/09/22 0916     Visit Number 7    Number of Visits 17    Date for PT Re-Evaluation 06/20/22    Authorization Type UHC Medicare/Medicaid of [place]    Progress Note Due on Visit 10    PT Start Time 0915    PT Stop Time 1000    PT Time Calculation (min) 45 min    Equipment Utilized During Treatment Gait belt    Activity Tolerance Patient tolerated treatment well    Behavior During Therapy WFL for tasks assessed/performed                 Past Medical History:  Diagnosis Date   Arthritis    Cataract    NS OU   Hypertension    Stroke [facility])    Past Surgical History:  Procedure Laterality Date   HIP FRACTURE SURGERY     LEG AMPUTATION BELOW KNEE     left leg   TOTAL HIP ARTHROPLASTY Left 10/31/2019   TOTAL HIP ARTHROPLASTY Left 10/31/2019   Procedure: LEFT TOTAL HIP ARTHROPLASTY, POSTERIOR;  Surgeon: Leandrew Koyanagi, MD;  Location: [facility];  Service: Orthopedics;  Laterality: Left;   Patient Active Problem List   Diagnosis Date Noted   Erectile dysfunction 04/03/2022   Anemia 07/06/2020   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   Closed displaced fracture of left femoral neck with delayed healing 09/28/2019   Establishing care with new doctor, encounter for 06/01/2019   TIA (transient ischemic attack) 09/13/2018   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   Hypoglycemia due to insulin 09/06/2018   Tobacco abuse 09/06/2018   Hyperlipidemia 09/06/2018   Essential hypertension 09/06/2018   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[place]) 04/16/2017    ONSET DATE: 04/03/2022 (referral)  REFERRING DIAG: I63.9 (ICD-10-CM) - Cerebellar stroke ([place])  THERAPY DIAG:  Unsteadiness on feet  Muscle weakness  (generalized)  Other abnormalities of gait and mobility  Ataxic gait  Other lack of coordination  Abnormal posture  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[place])  Rationale for Evaluation and Treatment: Rehabilitation  SUBJECTIVE:                                                                                                                                                                                             SUBJECTIVE STATEMENT: Pt denies falls/near falls and  no changes to medications. Patient reports that he tried a few exercises at home and found them somewhat challenging.   Pt accompanied by: self  PERTINENT HISTORY: Hx of L BKA, CVA, HTN   PAIN:  Are you having pain? No  PRECAUTIONS: Fall and Other: L BKA  WEIGHT BEARING RESTRICTIONS: No  FALLS: Has patient fallen in last 6 months? No  LIVING ENVIRONMENT: Lives with:  Iona Beard Lives in: House/apartment Stairs: Yes: External: 3 steps; on right going up Has following equipment at home: Gilford Rile - 2 wheeled, Environmental consultant - 4 wheeled, Wheelchair (manual), shower chair, and Grab bars  PLOF: Requires assistive device for independence, Needs assistance with ADLs, Needs assistance with homemaking, and pt reports he is now cooking small meals and "moving"   PATIENT GOALS: "My balance"  OBJECTIVE:   TODAY'S TREATMENT:     TherAct Assessed STG as noted below.   [facility] PT Assessment - 05/09/22 0001       Standardized Balance Assessment   Standardized Balance Assessment 10 meter walk test;Five Times Sit to Stand    Five times sit to stand comments  37.38   minA for stability to prevent forward pulsion   10 Meter Walk 0.85 ft/sec   minA with 2WW     Timed Up and Go Test   Normal TUG (seconds) 43.68   2WW + CGA           NMR:  Between // Bars with Bilateral UE support mimicking stairs (CGA-minA): Step taps on 6" step with 6lb ankle weights donned for increased proprioceptive input with visual cue on step for  foot placement for error augmentation 2 x 10 bil and bil alt  Step ups on 4" step with 6lb ankle weights donned for increased proprioceptive input with visual cue on step for foot placement for error augmentation 1 x 10 bil  PATIENT EDUCATION: Education details: Continue HEP; progress towards goals Person educated: Patient Education method: Explanation, Demonstration, Tactile cues, and Verbal cues Education comprehension: verbalized understanding and needs further education  HOME EXERCISE PROGRAM: Access Code: [email or web address] URL: [email or web address] Date: 02/14/2022 Prepared by: Elease Etienne   Exercises - Sit to Stand with Resistance Around Legs  - 1 x daily - 5 x weekly - 2 sets - 10 reps - Standing March with Counter Support  - 1 x daily - 7 x weekly - 3 sets - 10 reps - Standing Hip Extension with Counter Support  - 1 x daily - 7 x weekly - 3 sets - 10 reps - Side Stepping with Resistance at Thighs and Counter Support  - 1 x daily - 7 x weekly - 3 sets - 10 reps - Supine Bridge  - 1 x daily - 7 x weekly - 3 sets - 10 reps - Standing Single Leg Stance with Counter Support  - 1 x daily - 7 x weekly - 1 sets - 5 reps - 10 seconds hold - Standing Hamstring Curl with Chair Support  - 1 x daily - 7 x weekly - 3 sets - 10 reps  GOALS: Goals reviewed with patient? Yes  SHORT TERM GOALS: Target date: 05/09/2022   Pt will improve gait velocity to at least 1.0 ft/s w/LRAD and min A for improved gait efficiency and safety  Baseline: 0.78 ft/s with rollator and mod A; improved to 0.85 ft/s with minA with 2WW Goal status: IN PROGRESS  2.  Pt will improve 5 x STS to less than or equal to 38 seconds w/BUE  support to demonstrate improved functional strength and transfer efficiency.   Baseline: 44.91s w/BUE support and significant posterior bracing; improved to 37.38" required minA for stability to prevent forward pulsion  Goal status: MET  3.  Patient will ascend/descend 4  stairs with CGA with step to gait pattern with fair foot placement and bilateral UE support on single railing for safe entrance into home. Baseline: poor foot placement/UE use and minA; fair foot placement and min assist with step to gait pattern Goal status: MET  4.  Patient will improve TUG score to 40" with LRAD to indicate progress towards a decreased risk of falls and demonstrate improved overall mobility.   Baseline: 46.7" with 2WW + CGA; 42.68" with 2WW + CGA Goal status: IN PROGRESS   LONG TERM GOALS: Target date: 06/06/2022   Patient will ascend/descend 4 stairs with SBA with step to gait pattern with good foot placement and bilateral UE support on single railing for safe entrance into home. Baseline: poor foot placement/UE use and minA; improved to 37.38" required minA for stability to prevent forward pulsion  Goal status: IN PROGRESS  2.  Patient will improve TUG score to 35" with LRAD to indicate progress towards decreased risk of falls and demonstrate improved overall mobility.   Baseline: 46.7" with 2WW + CGA; 42.68" with 2WW + CGA Goal status: IN PROGRESS  3.  Pt will improve 5 x STS to less than or equal to 33 seconds w/BUE support to demonstrate improved functional strength and transfer efficiency.   Baseline: 44.91s w/BUE support and significant posterior bracing; improved to 37.38" required minA for stability to prevent forward pulsion    Goal status: IN PROGRESS  4.  Pt will improve gait velocity to at least 1.3 ft/s w/LRAD and CGA for improved gait efficiency and safety  Baseline: 0.78 ft/s with rollator and mod A; improved to 0.85 ft/s with minA with 2WW Goal status: IN PROGRESS  5.  Pt will be independent with final HEP for improved transfers, gait and strength Baseline:  Goal status: INITIAL   ASSESSMENT:  CLINICAL IMPRESSION: Session emphasized assessment of STGs. Patient achieved 2/4 short term goals and progressing well towards remaining. Patient is  still at a high risk for falls due to five time sit to stand score, TUG, and gait speed. Ended session with use of ankle weights and external visual cue to improve foot placement on step to improve safety entering / exiting home. Patient tolerated well.  Pt will continue to benefit from sit <>stand practice and proper body mechanics. Continue POC.    OBJECTIVE IMPAIRMENTS: Abnormal gait, decreased activity tolerance, decreased balance, decreased coordination, decreased knowledge of use of DME, difficulty walking, decreased strength, decreased safety awareness, impaired perceived functional ability, impaired vision/preception, improper body mechanics, prosthetic dependency , and pain  ACTIVITY LIMITATIONS: carrying, lifting, bending, standing, squatting, stairs, transfers, locomotion level, and caring for others  PARTICIPATION LIMITATIONS: meal prep, cleaning, laundry, interpersonal relationship, driving, shopping, community activity, occupation, and yard work  PERSONAL FACTORS: Age, Behavior pattern, Fitness, Past/current experiences, Time since onset of injury/illness/exacerbation, Transportation, and 1-2 comorbidities: L BKA and severe truncal and limb ataxia  are also affecting patient's functional outcome.   REHAB POTENTIAL: Fair due to time since onset of CVA, lack of support at home, severity of ataxia and impulsive behavior  CLINICAL DECISION MAKING: Evolving/moderate complexity  EVALUATION COMPLEXITY: Moderate  PLAN:  PT FREQUENCY: 2x/week  PT DURATION: 8 weeks  PLANNED INTERVENTIONS: Therapeutic exercises, Therapeutic activity, Neuromuscular re-education, Balance  training, Gait training, Patient/Family education, Self Care, Joint mobilization, Stair training, Vestibular training, Canalith repositioning, Prosthetic training, DME instructions, Wheelchair mobility training, Manual therapy, and Re-evaluation  PLAN FOR NEXT SESSION: update HEP as indicated, eccentric lower sit to stand  from elevated surface, education on equipment safety, functional transfers to and from various surfaces, safety on stairs, standing balance, lateral weight shifting, alt toe taps w/resistance to external visual cue target  Esperanza Heir, PT, DPT 05/09/2022, 11:29 AM

## 2022-05-13 ENCOUNTER — Encounter: Payer: Self-pay | Admitting: Physical Therapy

## 2022-05-13 ENCOUNTER — Ambulatory Visit: Payer: 59 | Admitting: Physical Therapy

## 2022-05-13 NOTE — Therapy (Signed)
[facility] [address] [place], [place], [address] Phone: [phone]   Fax:  [phone]  Patient Details  Name: Kristopher Ruiz. MRN: [id number] Date of Birth: Jan 12, 1951 Referring Provider:  No ref. provider found  Encounter Date: 05/13/2022   Pt did not show to scheduled PT appointment this date. Called pt and left VM to remind him of next scheduled appointment   Cruzita Lederer Ming Mcmannis, PT, DPT 05/13/2022, 9:58 AM  [facility] [address] [place], [place], [address] Phone: [phone]   Fax:  [phone]

## 2022-05-15 ENCOUNTER — Telehealth: Payer: Self-pay | Admitting: Pharmacist

## 2022-05-15 DIAGNOSIS — Z720 Tobacco use: Secondary | ICD-10-CM

## 2022-05-15 NOTE — Telephone Encounter (Signed)
Attempted to contact patient for follow-up of tobacco intake reduction / cessation.   NO answer - "unable to contact number dialed" No additional follow-up planned.   Total time with patient call and documentation of interaction: 6 minutes.  Additional follow-up phone call planned: none as patient contact is limited.

## 2022-05-15 NOTE — Assessment & Plan Note (Signed)
Attempted to contact patient for follow-up of tobacco intake reduction / cessation.   NO answer - "unable to contact number dialed" No additional follow-up planned.   Total time with patient call and documentation of interaction: 6 minutes.  Additional follow-up phone call planned: none as patient contact is limited.  Happy to meet with patient if still in need of tobacco cessation support at next PCP appointment.

## 2022-05-15 NOTE — Telephone Encounter (Signed)
-----   Message from Leavy Cella, [place] sent at 04/07/2022  1:32 PM EST ----- Regarding: Quit completely? Smoking only 1 using 1mg  BID varenicline AND nicotine gum

## 2022-05-16 ENCOUNTER — Ambulatory Visit: Payer: 59 | Admitting: Physical Therapy

## 2022-05-16 DIAGNOSIS — M6281 Muscle weakness (generalized): Secondary | ICD-10-CM

## 2022-05-16 DIAGNOSIS — R2681 Unsteadiness on feet: Secondary | ICD-10-CM

## 2022-05-16 DIAGNOSIS — R260 Ataxic gait: Secondary | ICD-10-CM

## 2022-05-16 NOTE — Therapy (Signed)
OUTPATIENT PHYSICAL THERAPY NEURO TREATMENT   Patient Name: Kristopher Ruiz. MRN: [id number] DOB:Aug 22, 1950, 72 y.o., male 38 Date: 05/16/2022   PCP: Alen Bleacher, MD REFERRING PROVIDER: Martyn Malay, MD  END OF SESSION:  PT End of Session - 05/16/22 0919     Visit Number 8    Number of Visits 17    Date for PT Re-Evaluation 06/20/22    Authorization Type UHC Medicare/Medicaid of [place]    Progress Note Due on Visit 10    PT Start Time 0918    PT Stop Time 1000    PT Time Calculation (min) 42 min    Equipment Utilized During Treatment Gait belt;Other (comment)   L BKA prosthesis   Activity Tolerance Patient tolerated treatment well    Behavior During Therapy WFL for tasks assessed/performed                  Past Medical History:  Diagnosis Date   Arthritis    Cataract    NS OU   Hypertension    Stroke [facility])    Past Surgical History:  Procedure Laterality Date   HIP FRACTURE SURGERY     LEG AMPUTATION BELOW KNEE     left leg   TOTAL HIP ARTHROPLASTY Left 10/31/2019   TOTAL HIP ARTHROPLASTY Left 10/31/2019   Procedure: LEFT TOTAL HIP ARTHROPLASTY, POSTERIOR;  Surgeon: Leandrew Koyanagi, MD;  Location: [place];  Service: Orthopedics;  Laterality: Left;   Patient Active Problem List   Diagnosis Date Noted   Erectile dysfunction 04/03/2022   Anemia 07/06/2020   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   Closed displaced fracture of left femoral neck with delayed healing 09/28/2019   Establishing care with new doctor, encounter for 06/01/2019   TIA (transient ischemic attack) 09/13/2018   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/2018   Hypoglycemia due to insulin 09/06/2018   Tobacco abuse 09/06/2018   Hyperlipidemia 09/06/2018   Essential hypertension 09/06/2018   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 No changes since last  visit. Pt reports he is moving to his daughter's house on the 29th, is an hour away from [place]. Doubt he can continue therapy here. Thinks she lives in [place] or [place], cannot remember.   Pt accompanied by: self  PERTINENT HISTORY: Hx of L BKA, CVA, HTN   PAIN:  Are you having pain? No  PRECAUTIONS: Fall and Other: L BKA  WEIGHT BEARING RESTRICTIONS: No  FALLS: Has patient fallen in last 6 months? No  LIVING ENVIRONMENT: Lives with:  Iona Beard Lives in: House/apartment Stairs: Yes: External: 3 steps; on right going up Has following equipment at home: Gilford Rile - 2 wheeled, Environmental consultant - 4 wheeled, Wheelchair (manual), shower chair, and Grab bars  PLOF: Requires assistive device for independence, Needs assistance with ADLs, Needs assistance with homemaking, and pt reports he is now cooking small meals and "moving"   PATIENT GOALS: "My balance"  OBJECTIVE:   TODAY'S TREATMENT:    NMR  Blaze pods w/7# ankle weights around bilateral ankles for improved LE coordination, proprioception, visual tracking and single leg stability:  Round 1: Using 4 pods in semi circle for 1 minute, 5 hits w/max A due to significant instability in posterolateral directions. Pt unable to step without UE support In // bars for improved stability and safety: Using 4 pods in straight line for side stepping  practice on 1 minute intervals w/30s of rest in between x3:  Round 1: 12 hits. Min cues to avoid crossover steps and rely more on BLEs than BUEs for balance   Round 2: 8 hits. Pt more stable this round and did not demonstrate crossover step Round 3: 7 hits  W/6 pods in a circle on 2 minute intervals and 30s rest x3. BUE support throughout: Round 1: 18 hits. Pt demonstrating good transition between fwd and retro stepping Round 2: 22 hits  Round 3: 25 hits RPE of 8/10 following session   PATIENT EDUCATION: Education details: Continue HEP; updating therapist on where pt is moving to determine where pt can receive therapy  Person educated: Patient Education method: Explanation, Demonstration, Tactile cues, and Verbal cues Education comprehension: verbalized understanding and needs further education  HOME EXERCISE PROGRAM: Access Code: [email or web address] URL: [email or web address] Date: 02/14/2022 Prepared by: Elease Etienne   Exercises - Sit to Stand with Resistance Around Legs  - 1 x daily - 5 x weekly - 2 sets - 10 reps - Standing March with Counter Support  - 1 x daily - 7 x weekly - 3 sets - 10 reps - Standing Hip Extension with Counter Support  - 1 x daily - 7 x weekly - 3 sets - 10 reps - Side Stepping with Resistance at Thighs and Counter Support  - 1 x daily - 7 x weekly - 3 sets - 10 reps - Supine Bridge  - 1 x daily - 7 x weekly - 3 sets - 10 reps - Standing Single Leg Stance with Counter Support  - 1 x daily - 7 x weekly - 1 sets - 5 reps - 10 seconds hold - Standing Hamstring Curl with Chair Support  - 1 x daily - 7 x weekly - 3 sets - 10 reps  GOALS: Goals reviewed with patient? Yes  SHORT TERM GOALS: Target date: 05/09/2022   Pt will improve gait velocity to at least 1.0 ft/s w/LRAD and min A for improved gait efficiency and safety  Baseline: 0.78 ft/s with rollator and mod A; improved to 0.85 ft/s with minA with 2WW Goal status: IN  PROGRESS  2.  Pt will improve 5 x STS to less than or equal to 38 seconds w/BUE support to demonstrate improved functional strength and transfer efficiency.   Baseline: 44.91s w/BUE  support and significant posterior bracing; improved to 37.38" required minA for stability to prevent forward pulsion  Goal status: MET  3.  Patient will ascend/descend 4 stairs with CGA with step to gait pattern with fair foot placement and bilateral UE support on single railing for safe entrance into home. Baseline: poor foot placement/UE use and minA; fair foot placement and min assist with step to gait pattern Goal status: MET  4.  Patient will improve TUG score to 40" with LRAD to indicate progress towards a decreased risk of falls and demonstrate improved overall mobility.   Baseline: 46.7" with 2WW + CGA; 42.68" with 2WW + CGA Goal status: IN PROGRESS   LONG TERM GOALS: Target date: 06/06/2022   Patient will ascend/descend 4 stairs with SBA with step to gait pattern with good foot placement and bilateral UE support on single railing for safe entrance into home. Baseline: poor foot placement/UE use and minA; improved to 37.38" required minA for stability to prevent forward pulsion  Goal status: IN PROGRESS  2.  Patient will improve TUG score to 35" with LRAD to indicate progress towards decreased risk of falls and demonstrate improved overall mobility.   Baseline: 46.7" with 2WW + CGA; 42.68" with 2WW + CGA Goal status: IN PROGRESS  3.  Pt will improve 5 x STS to less than or equal to 33 seconds w/BUE support to demonstrate improved functional strength and transfer efficiency.   Baseline: 44.91s w/BUE support and significant posterior bracing; improved to 37.38" required minA for stability to prevent forward pulsion    Goal status: IN PROGRESS  4.  Pt will improve gait velocity to at least 1.3 ft/s w/LRAD and CGA for improved gait efficiency and safety  Baseline: 0.78 ft/s with rollator and mod A; improved to 0.85 ft/s with minA with 2WW Goal status: IN PROGRESS  5.  Pt will be independent with final HEP for improved transfers, gait and strength Baseline:  Goal status:  INITIAL   ASSESSMENT:  CLINICAL IMPRESSION: Emphasis of skilled PT session on LE coordination, visual scanning and single leg stability. Pt initially unable to step well w/use of 7# ankle weights and required max A to perform blaze pod task without UE support due to poor foot placement and significant posterolateral LOB. Regressed task so pt had BUE support and pt significantly improved performance but required min cues to reduce crossover stepping and reaching too far out of BOS to work on foot placement rather than relying on BUE support for balance. Pt able to perform retro stepping and turns well today w/use of ankle weights and improved accuracy of toe taps to pods. Continue POC.    OBJECTIVE IMPAIRMENTS: Abnormal gait, decreased activity tolerance, decreased balance, decreased coordination, decreased knowledge of use of DME, difficulty walking, decreased strength, decreased safety awareness, impaired perceived functional ability, impaired vision/preception, improper body mechanics, prosthetic dependency , and pain  ACTIVITY LIMITATIONS: carrying, lifting, bending, standing, squatting, stairs, transfers, locomotion level, and caring for others  PARTICIPATION LIMITATIONS: meal prep, cleaning, laundry, interpersonal relationship, driving, shopping, community activity, occupation, and yard work  PERSONAL FACTORS: Age, Behavior pattern, Fitness, Past/current experiences, Time since onset of injury/illness/exacerbation, Transportation, and 1-2 comorbidities: L BKA and severe truncal and limb ataxia  are also affecting patient's functional outcome.   REHAB POTENTIAL: Fair  due to time since onset of CVA, lack of support at home, severity of ataxia and impulsive behavior  CLINICAL DECISION MAKING: Evolving/moderate complexity  EVALUATION COMPLEXITY: Moderate  PLAN:  PT FREQUENCY: 2x/week  PT DURATION: 8 weeks  PLANNED INTERVENTIONS: Therapeutic exercises, Therapeutic activity, Neuromuscular  re-education, Balance training, Gait training, Patient/Family education, Self Care, Joint mobilization, Stair training, Vestibular training, Canalith repositioning, Prosthetic training, DME instructions, Wheelchair mobility training, Manual therapy, and Re-evaluation  PLAN FOR NEXT SESSION: Did pt find out address of daughter? update HEP as indicated, eccentric lower sit to stand from elevated surface, education on equipment safety, functional transfers to and from various surfaces, safety on stairs, standing balance, lateral weight shifting, alt toe taps w/resistance to external visual cue target  Shaquoya Cosper E Malikiah Debarr, PT, DPT 05/16/2022, 10:01 AM

## 2022-05-20 ENCOUNTER — Ambulatory Visit: Payer: 59 | Admitting: Physical Therapy

## 2022-05-23 ENCOUNTER — Ambulatory Visit: Payer: 59 | Admitting: Physical Therapy

## 2022-05-23 ENCOUNTER — Encounter: Payer: Self-pay | Admitting: Physical Therapy

## 2022-05-23 NOTE — Therapy (Signed)
[facility] [address] [place], [place], [address] Phone: [phone]   Fax:  [phone]  Patient Details  Name: Kristopher Ruiz. MRN: [id number] Date of Birth: 09-May-1950 Referring Provider:  No ref. provider found  Encounter Date: 05/23/2022  Pt no-showed to today's scheduled PT appointment, his 2nd no-show this week. Called pt and left voicemail reminding him of no-show policy and next scheduled appointment. Will plan to DC pt if he does not show to next scheduled appointment   Cruzita Lederer Javis Abboud, PT, DPT 05/23/2022, 9:49 AM  [facility] [address] [place], [place], [address] Phone: [phone]   Fax:  [phone]

## 2022-05-27 ENCOUNTER — Ambulatory Visit: Payer: 59 | Admitting: Physical Therapy

## 2022-05-27 ENCOUNTER — Encounter: Payer: Self-pay | Admitting: Physical Therapy

## 2022-05-27 NOTE — Therapy (Signed)
[facility] [address] [place], [place], [address] Phone: [phone]   Fax:  [phone]  Patient Details  Name: Kristopher Ruiz. MRN: [id number] Date of Birth: 1951/03/27 Referring Provider:  No ref. provider found  Encounter Date: 05/27/2022   PHYSICAL THERAPY DISCHARGE SUMMARY  Visits from Start of Care: 8  Current functional level related to goals / functional outcomes: Pt requires AD and CGA-Min A for safety with mobility    Remaining deficits: Ataxic gait, decreased activity tolerance    Education / Equipment: HEP   Patient agrees to discharge. Patient goals were not met. Patient is being discharged due to  3 no shows.   Cruzita Lederer Roby Donaway, PT, DPT 05/27/2022, 1:54 PM  [facility] [address] [place], [place], [address] Phone: [phone]   Fax:  [phone]

## 2022-05-30 ENCOUNTER — Ambulatory Visit: Payer: 59 | Admitting: Physical Therapy

## 2022-06-03 ENCOUNTER — Ambulatory Visit: Payer: 59 | Admitting: Physical Therapy

## 2022-06-06 ENCOUNTER — Ambulatory Visit: Payer: Medicare Other | Admitting: Physical Therapy

## 2022-07-25 ENCOUNTER — Telehealth: Payer: Self-pay | Admitting: Student

## 2022-07-25 NOTE — Telephone Encounter (Signed)
Called patient to schedule Medicare Annual Wellness Visit (AWV). Left message for patient to call back and schedule Medicare Annual Wellness Visit (AWV).  Last date of AWV: 04/10/2021   Please schedule an AWVS appointment at any time with [facility] VISIT.  If any questions, please contact me at [phone].    Thank you,  [facility] Support [facility] Direct dial  [phone]

## 2022-09-04 ENCOUNTER — Other Ambulatory Visit: Payer: Self-pay | Admitting: Student

## 2022-10-16 NOTE — Patient Instructions (Addendum)
Kristopher Ruiz , Thank you for taking time to come for your Medicare Wellness Visit. I appreciate your ongoing commitment to your health goals. Please review the following plan we discussed and let me know if I can assist you in the future.   These are the goals we discussed:  Goals      Increase mobility     Quit Smoking     Patient reports uses nicotine patches and reports ~2 cigs per day now.        This is a list of the screening recommended for you and due dates:  Health Maintenance  Topic Date Due   Hepatitis C Screening  Never done   DTaP/Tdap/Td vaccine (1 - Tdap) Never done   Colon Cancer Screening  Never done   Zoster (Shingles) Vaccine (1 of 2) Never done   COVID-19 Vaccine (2 - 2023-24 season) 11/29/2021   Flu Shot  10/30/2022   Medicare Annual Wellness Visit  10/17/2023   Pneumonia Vaccine  Completed   HPV Vaccine  Aged Out    Advanced directives: Information on Advanced Care Planning can be found at [facility] Advance Health Care Directives Advance Health Care Directives ([email or web address] Please bring a copy of your health care power of attorney and living will to the office to be added to your chart at your convenience.  Conditions/risks identified: Aim for 30 minutes of exercise or brisk walking, 6-8 glasses of water, and 5 servings of fruits and vegetables each day.  Next appointment: Follow up in one year for your annual wellness visit.   Preventive Care 73 Years and Older, Male  Preventive care refers to lifestyle choices and visits with your health care provider that can promote health and wellness. What does preventive care include? A yearly physical exam. This is also called an annual well check. Dental exams once or twice a year. Routine eye exams. Ask your health care provider how often you should have your eyes checked. Personal lifestyle choices, including: Daily care of your teeth and gums. Regular physical activity. Eating a healthy  diet. Avoiding tobacco and drug use. Limiting alcohol use. Practicing safe sex. Taking low doses of aspirin every day. Taking vitamin and mineral supplements as recommended by your health care provider. What happens during an annual well check? The services and screenings done by your health care provider during your annual well check will depend on your age, overall health, lifestyle risk factors, and family history of disease. Counseling  Your health care provider may ask you questions about your: Alcohol use. Tobacco use. Drug use. Emotional well-being. Home and relationship well-being. Sexual activity. Eating habits. History of falls. Memory and ability to understand (cognition). Work and work Astronomer. Screening  You may have the following tests or measurements: Height, weight, and BMI. Blood pressure. Lipid and cholesterol levels. These may be checked every 5 years, or more frequently if you are over 79 years old. Skin check. Lung cancer screening. You may have this screening every year starting at age 86 if you have a 30-pack-year history of smoking and currently smoke or have quit within the past 15 years. Fecal occult blood test (FOBT) of the stool. You may have this test every year starting at age 45. Flexible sigmoidoscopy or colonoscopy. You may have a sigmoidoscopy every 5 years or a colonoscopy every 10 years starting at age 51. Prostate cancer screening. Recommendations will vary depending on your family history and other risks. Hepatitis C blood test. Hepatitis  B blood test. Sexually transmitted disease (STD) testing. Diabetes screening. This is done by checking your blood sugar (glucose) after you have not eaten for a while (fasting). You may have this done every 1-3 years. Abdominal aortic aneurysm (AAA) screening. You may need this if you are a current or former smoker. Osteoporosis. You may be screened starting at age 87 if you are at high risk. Talk with  your health care provider about your test results, treatment options, and if necessary, the need for more tests. Vaccines  Your health care provider may recommend certain vaccines, such as: Influenza vaccine. This is recommended every year. Tetanus, diphtheria, and acellular pertussis (Tdap, Td) vaccine. You may need a Td booster every 10 years. Zoster vaccine. You may need this after age 99. Pneumococcal 13-valent conjugate (PCV13) vaccine. One dose is recommended after age 69. Pneumococcal polysaccharide (PPSV23) vaccine. One dose is recommended after age 45. Talk to your health care provider about which screenings and vaccines you need and how often you need them. This information is not intended to replace advice given to you by your health care provider. Make sure you discuss any questions you have with your health care provider. Document Released: 04/13/2015 Document Revised: 12/05/2015 Document Reviewed: 01/16/2015 Elsevier Interactive Patient Education  2017 [employer].  Fall Prevention in the Home Falls can cause injuries. They can happen to people of all ages. There are many things you can do to make your home safe and to help prevent falls. What can I do on the outside of my home? Regularly fix the edges of walkways and driveways and fix any cracks. Remove anything that might make you trip as you walk through a door, such as a raised step or threshold. Trim any bushes or trees on the path to your home. Use bright outdoor lighting. Clear any walking paths of anything that might make someone trip, such as rocks or tools. Regularly check to see if handrails are loose or broken. Make sure that both sides of any steps have handrails. Any raised decks and porches should have guardrails on the edges. Have any leaves, snow, or ice cleared regularly. Use sand or salt on walking paths during winter. Clean up any spills in your garage right away. This includes oil or grease spills. What  can I do in the bathroom? Use night lights. Install grab bars by the toilet and in the tub and shower. Do not use towel bars as grab bars. Use non-skid mats or decals in the tub or shower. If you need to sit down in the shower, use a plastic, non-slip stool. Keep the floor dry. Clean up any water that spills on the floor as soon as it happens. Remove soap buildup in the tub or shower regularly. Attach bath mats securely with double-sided non-slip rug tape. Do not have throw rugs and other things on the floor that can make you trip. What can I do in the bedroom? Use night lights. Make sure that you have a light by your bed that is easy to reach. Do not use any sheets or blankets that are too big for your bed. They should not hang down onto the floor. Have a firm chair that has side arms. You can use this for support while you get dressed. Do not have throw rugs and other things on the floor that can make you trip. What can I do in the kitchen? Clean up any spills right away. Avoid walking on wet floors. Keep  items that you use a lot in easy-to-reach places. If you need to reach something above you, use a strong step stool that has a grab bar. Keep electrical cords out of the way. Do not use floor polish or wax that makes floors slippery. If you must use wax, use non-skid floor wax. Do not have throw rugs and other things on the floor that can make you trip. What can I do with my stairs? Do not leave any items on the stairs. Make sure that there are handrails on both sides of the stairs and use them. Fix handrails that are broken or loose. Make sure that handrails are as long as the stairways. Check any carpeting to make sure that it is firmly attached to the stairs. Fix any carpet that is loose or worn. Avoid having throw rugs at the top or bottom of the stairs. If you do have throw rugs, attach them to the floor with carpet tape. Make sure that you have a light switch at the top of the  stairs and the bottom of the stairs. If you do not have them, ask someone to add them for you. What else can I do to help prevent falls? Wear shoes that: Do not have high heels. Have rubber bottoms. Are comfortable and fit you well. Are closed at the toe. Do not wear sandals. If you use a stepladder: Make sure that it is fully opened. Do not climb a closed stepladder. Make sure that both sides of the stepladder are locked into place. Ask someone to hold it for you, if possible. Clearly mark and make sure that you can see: Any grab bars or handrails. First and last steps. Where the edge of each step is. Use tools that help you move around (mobility aids) if they are needed. These include: Canes. Walkers. Scooters. Crutches. Turn on the lights when you go into a dark area. Replace any light bulbs as soon as they burn out. Set up your furniture so you have a clear path. Avoid moving your furniture around. If any of your floors are uneven, fix them. If there are any pets around you, be aware of where they are. Review your medicines with your doctor. Some medicines can make you feel dizzy. This can increase your chance of falling. Ask your doctor what other things that you can do to help prevent falls. This information is not intended to replace advice given to you by your health care provider. Make sure you discuss any questions you have with your health care provider. Document Released: 01/11/2009 Document Revised: 08/23/2015 Document Reviewed: 04/21/2014 Elsevier Interactive Patient Education  2017 [employer].

## 2022-10-16 NOTE — Progress Notes (Signed)
Subjective:   Kristopher Diehl. is a 72 y.o. male who presents for Medicare Annual/Subsequent preventive examination.  Visit Complete: Virtual  I connected with  Kristopher Ruiz. on 10/17/22 by a audio enabled telemedicine application and verified that I am speaking with the correct person using two identifiers.  Patient Location: Home  Provider Location: Home Office  I discussed the limitations of evaluation and management by telemedicine. The patient expressed understanding and agreed to proceed.  Review of Systems     Cardiac Risk Factors include: advanced age (>54men, >75 women);hypertension;male gender;dyslipidemia;smoking/ tobacco exposure  Per patient no change in vitals since last visit, unable to obtain new vitals due to telehealth visit     Objective:    Today's Vitals   10/17/22 1550  Weight: 151 lb (68.5 kg)  Height: 5\' 3"  (1.6 m)   Body mass index is 26.75 kg/m.     10/17/2022    3:55 PM 04/03/2022    9:13 AM 11/12/2021    1:14 PM 05/15/2021   12:39 PM 04/10/2021   11:16 AM 07/06/2020    9:12 AM 10/31/2019   11:32 AM  Advanced Directives  Does Patient Have a Medical Advance Directive? No No No No No No No  Would patient like information on creating a medical advance directive? Yes (MAU/Ambulatory/Procedural Areas - Information given) No - Patient declined  No - Patient declined No - Patient declined No - Patient declined No - Patient declined    Current Medications (verified) Outpatient Encounter Medications as of 10/17/2022  Medication Sig   acetaminophen (TYLENOL) 500 MG tablet Take 500 mg by mouth at bedtime.   amLODipine (NORVASC) 10 MG tablet Take 1 tablet (10 mg total) by mouth daily.   ASPIRIN LOW DOSE 81 MG EC tablet Take 81 mg by mouth daily.   atorvastatin (LIPITOR) 80 MG tablet Take 1 tablet (80 mg total) by mouth daily.   brimonidine (ALPHAGAN) 0.2 % ophthalmic solution    metoprolol succinate (TOPROL-XL) 25 MG 24 hr tablet Take 1 tablet (25 mg total)  by mouth daily.   nicotine polacrilex (NICORETTE) 2 MG gum Take 2 mg by mouth as needed for smoking cessation.   olopatadine (PATANOL) 0.1 % ophthalmic solution Place 1 drop into both eyes 2 (two) times daily.   RESTASIS 0.05 % ophthalmic emulsion Place 1 drop into both eyes 2 (two) times daily.   tadalafil (CIALIS) 10 MG tablet Take 1 tablet (10 mg total) by mouth every other day as needed for erectile dysfunction.   varenicline (CHANTIX) 1 MG tablet TAKE ONE DAILY WITH FOOD FOR THE FIRST WEEK THEN TWICE DAILY   No facility-administered encounter medications on file as of 10/17/2022.    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   TOTAL HIP ARTHROPLASTY Left 10/31/2019   Procedure: LEFT TOTAL HIP ARTHROPLASTY, POSTERIOR;  Surgeon: Tarry Kos, MD;  Location: [facility] OR;  Service: Orthopedics;  Laterality: Left;   Family History  Problem Relation Age of Onset   Hypertension Mother    Hypertension Father    Social History   Socioeconomic History   Marital status: Widowed  Spouse name: Not on file   Number of children: 2   Years of education: 4   Highest education level: 4th grade  Occupational History   Occupation: Disability  Tobacco Use   Smoking status: Some Days    Current packs/day: 0.00    Types: Cigarettes    Last attempt to quit: 08/14/2021    Years since quitting: 1.1   Smokeless tobacco: Never   Tobacco comments:    Smokes when he is stressed.   Vaping Use   Vaping status: Never Used  Substance and Sexual Activity   Alcohol use: No   Drug use: No   Sexual activity: Not Currently  Other Topics Concern   Not on file  Social History Narrative   Patient lives in [place] with his roommate  Kristopher Ruiz.   Patient has an aide 7 days a week for 2 hours to help with cooking and cleaning.    Patient has 2 two daughters in [place].    Patient has a step daughter here who looks after him. Kristopher Ruiz [facility] patient.)      Social Determinants of Health   Financial Resource Strain: Low Risk  (10/17/2022)   Overall Financial Resource Strain (CARDIA)    Difficulty of Paying Living Expenses: Not hard at all  Food Insecurity: No Food Insecurity (10/17/2022)   Hunger Vital Sign    Worried About Running Out of Food in the Last Year: Never true    Ran Out of Food in the Last Year: Never true  Transportation Needs: No Transportation Needs (10/17/2022)   PRAPARE - Administrator, Civil Service (Medical): No    Lack of Transportation (Non-Medical): No  Physical Activity: Insufficiently Active (10/17/2022)   Exercise Vital Sign    Days of Exercise per Week: 3 days    Minutes of Exercise per Session: 10 min  Stress: No Stress Concern Present (10/17/2022)   [employer] of Occupational Health - Occupational Stress Questionnaire    Feeling of Stress : Only a little  Social Connections: Moderately Isolated (10/17/2022)   Social Connection and Isolation Panel [NHANES]    Frequency of Communication with Friends and Family: More than three times a week    Frequency of Social Gatherings with Friends and Family: Three times a week    Attends Religious Services: 1 to 4 times per year    Active Member of Clubs or Organizations: No    Attends Banker Meetings: Never    Marital Status: Widowed    Tobacco Counseling Ready to quit: Not Answered Counseling given: Not Answered Tobacco comments: Smokes when he is stressed.    Clinical Intake:  Pre-visit preparation completed: Yes  Pain : No/denies pain     Diabetes: No  How often do you need to have someone help you when you read instructions, pamphlets, or other written materials from your doctor or pharmacy?: 1 -  Never  Interpreter Needed?: No  Information entered by :: Kandis Fantasia LPN   Activities of Daily Living    10/17/2022    3:53 PM  In your present state of health, do you have any difficulty performing the following activities:  Hearing? 0  Vision? 0  Difficulty concentrating or making decisions? 0  Walking or climbing stairs? 1  Dressing or bathing? 0  Doing errands, shopping? 1  Preparing Food and eating ? N  Using the Toilet? N  In the past six months, have you accidently leaked urine? N  Do you  have problems with loss of bowel control? N  Managing your Medications? N  Managing your Finances? N  Housekeeping or managing your Housekeeping? Y    Patient Care Team: Jerre Simon, MD as PCP - General (Family Medicine)  Indicate any recent Medical Services you may have received from other than [facility] providers in the past year (date may be approximate).     Assessment:   This is a routine wellness examination for Shomari.  Hearing/Vision screen Hearing Screening - Comments:: Denies hearing difficulties   Vision Screening - Comments:: No vision problems; will schedule routine eye exam soon    Dietary issues and exercise activities discussed:     Goals Addressed             This Visit's Progress    COMPLETED: Client will have ADL needs addressed by [facility]       Patient Goals/Self-Care Activities: Over the next 5 days Select a new PCS provider  Call [facility] to schedule assessment [phone] or [phone]      COMPLETED: Effective Long-Term Care Planning       Patient Goals/Self-Care Activities :  Complete Advance Directive packet,  Call LCSW if you have questions  Have advance directive notarized and provide a copy to provider office       COMPLETED: Find Help in My Community       Timeframe:  Short-Term Goal Priority:  High Start Date:   07/18/20                          Expected End Date:                        Call Department of Social  Services [phone] to apply for Medicaid Transportation  Call your insurance provider to discuss transportation options Call your insurance provider for more information about your Enhanced Benefits  Follow up on SCAT application to get assessment scheduled   Why is this important?   Knowing how and where to find help for yourself or family in your neighborhood and community is an important skill.       Increase mobility         Depression Screen    10/17/2022    3:56 PM 04/03/2022    9:13 AM 12/24/2021    9:21 AM 04/10/2021   11:12 AM 07/06/2020    9:17 AM 06/01/2019    4:07 PM  PHQ 2/9 Scores  PHQ - 2 Score 1 1 4 2 3 4   PHQ- 9 Score   9 6 9      Fall Risk    10/17/2022    3:57 PM 04/03/2022    9:13 AM 12/24/2021    9:21 AM 04/10/2021   11:14 AM 06/01/2019    4:07 PM  Fall Risk   Falls in the past year? 0 1 1 1    Number falls in past yr: 0 0 0 0 1  Injury with Fall? 0 0 0 0 0  Risk for fall due to : Impaired mobility;Impaired balance/gait   Impaired mobility;Impaired balance/gait   Follow up Falls prevention discussed;Education provided;Falls evaluation completed  Falls evaluation completed Falls prevention discussed     MEDICARE RISK AT HOME:  Medicare Risk at Home - 10/17/22 1557     Any stairs in or around the home? No    If so, are there any without handrails? No    Home free of  loose throw rugs in walkways, pet beds, electrical cords, etc? Yes    Adequate lighting in your home to reduce risk of falls? Yes    Life alert? No    Use of a cane, walker or w/c? Yes    Grab bars in the bathroom? Yes    Shower chair or bench in shower? Yes    Elevated toilet seat or a handicapped toilet? Yes             TIMED UP AND GO:  Was the test performed?  No    Cognitive Function:        10/17/2022    3:57 PM 04/10/2021   11:17 AM  6CIT Screen  What Year? 0 points 0 points  What month? 0 points 0 points  What time? 0 points 0 points  Count back from 20 0 points 0  points  Months in reverse 2 points 2 points  Repeat phrase 0 points 0 points  Total Score 2 points 2 points    Immunizations Immunization History  Administered Date(s) Administered   Fluad Quad(high Dose 65+) 02/15/2021, 12/24/2021   Moderna Sars-Covid-2 Vaccination 11/02/2019   PNEUMOCOCCAL CONJUGATE-20 02/15/2021    TDAP status: Due, Education has been provided regarding the importance of this vaccine. Advised may receive this vaccine at local pharmacy or Health Dept. Aware to provide a copy of the vaccination record if obtained from local pharmacy or Health Dept. Verbalized acceptance and understanding.  Pneumococcal vaccine status: Up to date  Covid-19 vaccine status: Information provided on how to obtain vaccines.   Qualifies for Shingles Vaccine? Yes   Zostavax completed No   Shingrix Completed?: No.    Education has been provided regarding the importance of this vaccine. Patient has been advised to call insurance company to determine out of pocket expense if they have not yet received this vaccine. Advised may also receive vaccine at local pharmacy or Health Dept. Verbalized acceptance and understanding.  Screening Tests Health Maintenance  Topic Date Due   Hepatitis C Screening  Never done   DTaP/Tdap/Td (1 - Tdap) Never done   Colonoscopy  Never done   Zoster Vaccines- Shingrix (1 of 2) Never done   COVID-19 Vaccine (2 - 2023-24 season) 11/29/2021   INFLUENZA VACCINE  10/30/2022   Medicare Annual Wellness (AWV)  10/17/2023   Pneumonia Vaccine 66+ Years old  Completed   HPV VACCINES  Aged Out    Health Maintenance  Health Maintenance Due  Topic Date Due   Hepatitis C Screening  Never done   DTaP/Tdap/Td (1 - Tdap) Never done   Colonoscopy  Never done   Zoster Vaccines- Shingrix (1 of 2) Never done   COVID-19 Vaccine (2 - 2023-24 season) 11/29/2021    Colorectal cancer screening:  Patient states that he completed Cologuard; no results available   Lung Cancer  Screening: (Low Dose CT Chest recommended if Age 1-80 years, 20 pack-year currently smoking OR have quit w/in 15years.) does not qualify.   Lung Cancer Screening Referral: n/a  Additional Screening:  Hepatitis C Screening: does qualify  Vision Screening: Recommended annual ophthalmology exams for early detection of glaucoma and other disorders of the eye. Is the patient up to date with their annual eye exam?  No  Who is the provider or what is the name of the office in which the patient attends annual eye exams? none If pt is not established with a provider, would they like to be referred to a provider to  establish care? No .   Dental Screening: Recommended annual dental exams for proper oral hygiene  Community Resource Referral / Chronic Care Management: CRR required this visit?  No   CCM required this visit?  No     Plan:     I have personally reviewed and noted the following in the patient's chart:   Medical and social history Use of alcohol, tobacco or illicit drugs  Current medications and supplements including opioid prescriptions. Patient is not currently taking opioid prescriptions. Functional ability and status Nutritional status Physical activity Advanced directives List of other physicians Hospitalizations, surgeries, and ER visits in previous 12 months Vitals Screenings to include cognitive, depression, and falls Referrals and appointments  In addition, I have reviewed and discussed with patient certain preventive protocols, quality metrics, and best practice recommendations. A written personalized care plan for preventive services as well as general preventive health recommendations were provided to patient.     Kandis Fantasia [place], [place]   1/61/0960   After Visit Summary: (Mail) Due to this being a telephonic visit, the after visit summary with patients personalized plan was offered to patient via mail   Nurse Notes: No concerns at this time

## 2022-10-17 ENCOUNTER — Ambulatory Visit (INDEPENDENT_AMBULATORY_CARE_PROVIDER_SITE_OTHER): Payer: 59

## 2022-10-17 VITALS — Ht 63.0 in | Wt 151.0 lb

## 2022-10-17 DIAGNOSIS — Z0000 Encounter for general adult medical examination without abnormal findings: Secondary | ICD-10-CM

## 2022-12-15 ENCOUNTER — Other Ambulatory Visit: Payer: Self-pay | Admitting: Student

## 2022-12-16 ENCOUNTER — Other Ambulatory Visit: Payer: Self-pay

## 2022-12-16 DIAGNOSIS — I10 Essential (primary) hypertension: Secondary | ICD-10-CM

## 2022-12-16 MED ORDER — METOPROLOL SUCCINATE ER 25 MG PO TB24: 25.0000 mg | ORAL_TABLET | Freq: Every day | ORAL | 1 refills | Status: AC

## 2022-12-16 MED ORDER — ATORVASTATIN CALCIUM 80 MG PO TABS
80.0000 mg | ORAL_TABLET | Freq: Every day | ORAL | 3 refills | Status: AC
Start: 2022-12-16 — End: ?

## 2023-01-15 ENCOUNTER — Ambulatory Visit: Payer: 59 | Admitting: Student

## 2023-10-22 ENCOUNTER — Ambulatory Visit: Payer: 59

## 2023-10-22 VITALS — Ht 63.0 in | Wt 154.0 lb

## 2023-10-22 DIAGNOSIS — Z0000 Encounter for general adult medical examination without abnormal findings: Secondary | ICD-10-CM

## 2023-10-22 NOTE — Patient Instructions (Signed)
 Mr. Kristopher Ruiz , Thank you for taking time out of your busy schedule to complete your Annual Wellness Visit with me. I enjoyed our conversation and look forward to speaking with you again next year. I, as well as your care team,  appreciate your ongoing commitment to your health goals. Please review the following plan we discussed and let me know if I can assist you in the future. Your Game plan/ To Do List    Referrals: If you haven't heard from the office you've been referred to, please reach out to them at the phone provided.  Social Worker Evaluation Follow up Visits: Next Medicare AWV with our clinical staff: 10/24/2024 at 10:30 a.m. phone visit with nurse   Have you seen your provider in the last 6 months (3 months if uncontrolled diabetes)? No Next Office Visit with your provider: Office will call to schedule  Clinician Recommendations:  Aim for 30 minutes of exercise or brisk walking, 6-8 glasses of water, and 5 servings of fruits and vegetables each day.       This is a list of the screening recommended for you and due dates:  Health Maintenance  Topic Date Due   Hepatitis C Screening  Never done   DTaP/Tdap/Td vaccine (1 - Tdap) Never done   Colon Cancer Screening  Never done   Zoster (Shingles) Vaccine (1 of 2) Never done   COVID-19 Vaccine (2 - 2024-25 season) 11/30/2022   Flu Shot  10/30/2023   Medicare Annual Wellness Visit  10/21/2024   Pneumococcal Vaccine for age over 28  Completed   Hepatitis B Vaccine  Aged Out   HPV Vaccine  Aged Out   Meningitis B Vaccine  Aged Out    Advanced directives: (Declined) Advance directive discussed with you today. Even though you declined this today, please call our office should you change your mind, and we can give you the proper paperwork for you to fill out. Advance Care Planning is important because it:  [x]  Makes sure you receive the medical care that is consistent with your values, goals, and preferences  [x]  It provides guidance  to your family and loved ones and reduces their decisional burden about whether or not they are making the right decisions based on your wishes.  Follow the link provided in your after visit summary or read over the paperwork we have mailed to you to help you started getting your Advance Directives in place. If you need assistance in completing these, please reach out to us  so that we can help you!  See attachments for Preventive Care and Fall Prevention Tips.

## 2023-10-22 NOTE — Progress Notes (Signed)
 Because this visit was a virtual/telehealth visit,  certain criteria was not obtained, such a blood pressure, CBG if applicable, and timed get up and go. Any medications not marked as taking were not mentioned during the medication reconciliation part of the visit. Any vitals not documented were not able to be obtained due to this being a telehealth visit or patient was unable to self-report a recent blood pressure reading due to a lack of equipment at home via telehealth. Vitals that have been documented are verbally provided by the patient.   Subjective:   Kristopher Ruiz. is a 73 y.o. who presents for a Medicare Wellness preventive visit.  As a reminder, Annual Wellness Visits don't include a physical exam, and some assessments may be limited, especially if this visit is performed virtually. We may recommend an in-person follow-up visit with your provider if needed.  Visit Complete: Virtual I connected with  Kristopher Ruiz. on 10/22/23 by a audio enabled telemedicine application and verified that I am speaking with the correct person using two identifiers.  Patient Location: Home  Provider Location: Home Office  I discussed the limitations of evaluation and management by telemedicine. The patient expressed understanding and agreed to proceed.  Vital Signs: Because this visit was a virtual/telehealth visit, some criteria may be missing or patient reported. Any vitals not documented were not able to be obtained and vitals that have been documented are patient reported.  VideoDeclined- This patient declined Librarian, academic. Therefore the visit was completed with audio only.  Persons Participating in Visit: Patient.  AWV Questionnaire: No: Patient Medicare AWV questionnaire was not completed prior to this visit.  Cardiac Risk Factors include: advanced age (>28men, >38 women);dyslipidemia;family history of premature cardiovascular disease;hypertension;male  gender;sedentary lifestyle;smoking/ tobacco exposure     Objective:    Today's Vitals   10/22/23 1055  Weight: 154 lb (69.9 kg)  Height: 5' 3 (1.6 m)  PainSc: 9   PainLoc: Hip   Body mass index is 27.28 kg/m.     10/22/2023   12:11 PM 10/17/2022    3:55 PM 04/03/2022    9:13 AM 11/12/2021    1:14 PM 05/15/2021   12:39 PM 04/10/2021   11:16 AM 07/06/2020    9:12 AM  Advanced Directives  Does Patient Have a Medical Advance Directive? No No No No No No No  Would patient like information on creating a medical advance directive? No - Patient declined Yes (MAU/Ambulatory/Procedural Areas - Information given) No - Patient declined  No - Patient declined No - Patient declined No - Patient declined    Current Medications (verified) Outpatient Encounter Medications as of 10/22/2023  Medication Sig   acetaminophen  (TYLENOL ) 500 MG tablet Take 500 mg by mouth at bedtime.   amLODipine  (NORVASC ) 10 MG tablet TAKE 1 TABLET BY MOUTH EVERY DAY   ASPIRIN  LOW DOSE 81 MG EC tablet Take 81 mg by mouth daily.   atorvastatin  (LIPITOR ) 80 MG tablet Take 1 tablet (80 mg total) by mouth daily.   brimonidine  (ALPHAGAN ) 0.2 % ophthalmic solution    metoprolol  succinate (TOPROL -XL) 25 MG 24 hr tablet Take 1 tablet (25 mg total) by mouth daily.   nicotine  polacrilex (NICORETTE) 2 MG gum Take 2 mg by mouth as needed for smoking cessation.   olopatadine  (PATANOL) 0.1 % ophthalmic solution Place 1 drop into both eyes 2 (two) times daily.   RESTASIS  0.05 % ophthalmic emulsion Place 1 drop into both eyes 2 (two) times  daily.   tadalafil  (CIALIS ) 10 MG tablet Take 1 tablet (10 mg total) by mouth every other day as needed for erectile dysfunction.   varenicline  (CHANTIX ) 1 MG tablet TAKE ONE DAILY WITH FOOD FOR THE FIRST WEEK THEN TWICE DAILY   No facility-administered encounter medications on file as of 10/22/2023.    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   TOTAL HIP ARTHROPLASTY Left 10/31/2019   Procedure: LEFT TOTAL HIP ARTHROPLASTY, POSTERIOR;  Surgeon: Jerri Kay HERO, MD;  Location: [facility] OR;  Service: Orthopedics;  Laterality: Left;   Family History  Problem Relation Age of Onset   Hypertension Mother    Hypertension Father    Social History   Socioeconomic History   Marital status: Widowed    Spouse name: Not on file   Number of children: 2   Years of education: 4   Highest education level: 4th grade  Occupational History   Occupation: Disability  Tobacco Use   Smoking status: Some Days    Current packs/day: 0.00    Types: Cigarettes    Last attempt to quit: 08/14/2021    Years since quitting: 2.1   Smokeless tobacco: Never   Tobacco comments:    Smokes when he is stressed.   Vaping Use   Vaping status: Never Used  Substance and Sexual Activity   Alcohol use: No   Drug use: No   Sexual activity: Not Currently  Other Topics Concern   Not on file  Social History Narrative   Patient lives in [place] with his roommate Zachary.   Patient has an aide 7 days a week for 2 hours to help with cooking and cleaning.    Patient has 2 two daughters in  .    Patient has a step daughter here who looks after him. Har[facility] patient.)      Social Drivers of Health   Financial Resource Strain: Medium Risk (10/22/2023)   Overall Financial Resource Strain (CARDIA)    Difficulty of Paying Living Expenses: Somewhat hard  Food Insecurity: Food Insecurity Present (10/22/2023)   Hunger Vital Sign    Worried About Running Out of Food in the Last Year: Often true    Ran Out of Food in the Last Year: Often true  Transportation Needs: Unmet Transportation Needs (10/22/2023)   PRAPARE - Scientist, research (physical sciences) (Medical): Yes    Lack of Transportation (Non-Medical): Yes  Physical Activity: Inactive (10/22/2023)   Exercise Vital Sign    Days of Exercise per Week: 0 days    Minutes of Exercise per Session: 0 min  Stress: Stress Concern Present (10/22/2023)   [employer] of Occupational Health - Occupational Stress Questionnaire    Feeling of Stress: To some extent  Social Connections: Moderately Isolated (10/22/2023)   Social Connection and Isolation Panel    Frequency of Communication with Friends and Family: More than three times a week    Frequency of Social Gatherings with Friends and Family: Three times a week    Attends Religious Services: 1 to 4 times per year    Active Member of Clubs or Organizations:  No    Attends Banker Meetings: Never    Marital Status: Widowed    Tobacco Counseling Ready to quit: Not Answered Counseling given: Not Answered Tobacco comments: Smokes when he is stressed.     Clinical Intake:  Pre-visit preparation completed: Yes  Pain : 0-10 Pain Score: 9  Pain Type: Chronic pain Pain Location: Hip Pain Orientation: Right Pain Descriptors / Indicators: Aching, Discomfort Pain Onset: More than a month ago Pain Frequency: Constant     BMI - recorded: 27.28 Nutritional Status: BMI 25 -29 Overweight Nutritional Risks: None Diabetes: No  Lab Results  Component Value Date   HGBA1C 6.0 07/06/2020   HGBA1C 6.4 (H) 09/14/2018   HGBA1C 6.2 (H) 09/07/2018     How often do you need to have someone help you when you read instructions, pamphlets, or other written materials from your doctor or pharmacy?: 1 - Never What is the last grade level you completed in school?: 4th grade  Interpreter Needed?: No  Information entered by :: Roz Fuller, LPN.   Activities of Daily Living     10/22/2023   11:03 AM  In your present state of health, do you have any difficulty performing the following activities:  Hearing? 0   Vision? 1  Difficulty concentrating or making decisions? 1  Walking or climbing stairs? 1  Dressing or bathing? 1  Doing errands, shopping? 1  Preparing Food and eating ? Y  Using the Toilet? N  In the past six months, have you accidently leaked urine? N  Do you have problems with loss of bowel control? N  Managing your Medications? N  Managing your Finances? N  Housekeeping or managing your Housekeeping? Y    Patient Care Team: Rosendo Rush, MD as PCP - General (Family Medicine)  I have updated your Care Teams any recent Medical Services you may have received from other providers in the past year.     Assessment:   This is a routine wellness examination for Tay[place].  Hearing/Vision screen Hearing Screening - Comments:: Denies hearing difficulties.  Vision Screening - Comments:: No rx glasses - not up to date with routine eye exams.  Needs an referral to optometrist. Patient having vision issues.    Goals Addressed             This Visit's Progress    10/22/23: To increase mobility and to quit smoking.       Per patient: 1 pack will last 3 days now.       Depression Screen     10/22/2023   11:05 AM 10/17/2022    3:56 PM 04/03/2022    9:13 AM 12/24/2021    9:21 AM 04/10/2021   11:12 AM 07/06/2020    9:17 AM 06/01/2019    4:07 PM  PHQ 2/9 Scores  PHQ - 2 Score 0 1 1 4 2 3 4   PHQ- 9 Score 3   9 6 9      Fall Risk     10/22/2023   11:02 AM 10/17/2022    3:57 PM 04/03/2022    9:13 AM 12/24/2021    9:21 AM 04/10/2021   11:14 AM  Fall Risk   Falls in the past year? 1 0 1 1 1   Number falls in past yr: 1 0 0 0 0  Injury with Fall? 1 0 0 0 0  Risk for fall due to : History of fall(s);Impaired balance/gait;Orthopedic patient Impaired mobility;Impaired balance/gait   Impaired mobility;Impaired balance/gait  Follow up Falls evaluation completed;Education provided Falls prevention discussed;Education provided;Falls evaluation completed  Falls evaluation completed  Falls  prevention discussed      Data saved with a previous flowsheet row definition    MEDICARE RISK AT HOME:  Medicare Risk at Home Any stairs in or around the home?: No If so, are there any without handrails?: No Home free of loose throw rugs in walkways, pet beds, electrical cords, etc?: Yes Adequate lighting in your home to reduce risk of falls?: Yes Life alert?: No Use of a cane, walker or w/c?: Yes (Walker) Grab bars in the bathroom?: No Shower chair or bench in shower?: Yes Elevated toilet seat or a handicapped toilet?: Yes  TIMED UP AND GO:  Was the test performed?  No  Cognitive Function: Declined/Normal: No cognitive concerns noted by patient or family. Patient alert, oriented, able to answer questions appropriately and recall recent events. No signs of memory loss or confusion.    10/22/2023   11:03 AM  MMSE - Mini Mental State Exam  Not completed: Unable to complete        10/22/2023   11:03 AM 10/17/2022    3:57 PM 04/10/2021   11:17 AM  6CIT Screen  What Year? 0 points 0 points 0 points  What month? 0 points 0 points 0 points  What time? 0 points 0 points 0 points  Count back from 20 0 points 0 points 0 points  Months in reverse 0 points 2 points 2 points  Repeat phrase 0 points 0 points 0 points  Total Score 0 points 2 points 2 points    Immunizations Immunization History  Administered Date(s) Administered   Fluad Quad(high Dose 65+) 02/15/2021, 12/24/2021   Moderna Sars-Covid-2 Vaccination 11/02/2019   PNEUMOCOCCAL CONJUGATE-20 02/15/2021    Screening Tests Health Maintenance  Topic Date Due   Hepatitis C Screening  Never done   DTaP/Tdap/Td (1 - Tdap) Never done   Colonoscopy  Never done   Zoster Vaccines- Shingrix (1 of 2) Never done   COVID-19 Vaccine (2 - 2024-25 season) 11/30/2022   INFLUENZA VACCINE  10/30/2023   Medicare Annual Wellness (AWV)  10/21/2024   Pneumococcal Vaccine: 50+ Years  Completed   Hepatitis B Vaccines  Aged Out   HPV  VACCINES  Aged Out   Meningococcal B Vaccine  Aged Out    Health Maintenance  Health Maintenance Due  Topic Date Due   Hepatitis C Screening  Never done   DTaP/Tdap/Td (1 - Tdap) Never done   Colonoscopy  Never done   Zoster Vaccines- Shingrix (1 of 2) Never done   COVID-19 Vaccine (2 - 2024-25 season) 11/30/2022   Health Maintenance Items Addressed: Yes Patient declined vaccines and screenings.  Additional Screening:  Vision Screening: Recommended annual ophthalmology exams for early detection of glaucoma and other disorders of the eye. Would you like a referral to an eye doctor? Yes    Dental Screening: Recommended annual dental exams for proper oral hygiene  Community Resource Referral / Chronic Care Management: CRR required this visit?  No   CCM required this visit?  PCP informed of CCM need   Plan:    I have personally reviewed and noted the following in the patient's chart:   Medical and social history Use of alcohol, tobacco or illicit drugs  Current medications and supplements including opioid prescriptions. Patient is not currently taking opioid prescriptions. Functional ability and status Nutritional status Physical activity Advanced directives List of other physicians Hospitalizations,  surgeries, and ER visits in previous 12 months Vitals Screenings to include cognitive, depression, and falls Referrals and appointments  In addition, I have reviewed and discussed with patient certain preventive protocols, quality metrics, and best practice recommendations. A written personalized care plan for preventive services as well as general preventive health recommendations were provided to patient.   Roz LOISE Fuller, LPN   [id number]   After Visit Summary: (Declined) Due to this being a telephonic visit, with patients personalized plan was offered to patient but patient Declined AVS at this time   Notes: Patient is having the following issues: Financial  strain, Lack of transportation, Literacy Issues, requesting in home aid to provide assistance with ADLs.

## 2023-10-26 ENCOUNTER — Telehealth: Payer: Self-pay | Admitting: Student

## 2023-10-26 NOTE — Telephone Encounter (Signed)
 Called LVM to call back. Thanks!

## 2023-10-26 NOTE — Telephone Encounter (Signed)
-----   Message from Rollene FORBES Keeling sent at 10/23/2023  2:24 PM EDT ----- Regarding: Call pt for virtual appt [facility] team,  Can we please call pt to get set up for virtual appt with PCP or other doctor to discuss social needs.[facility] referral?  Thanks, Dr Keeling

## 2024-10-24 ENCOUNTER — Encounter
